# Patient Record
Sex: Female | Born: 1950
Health system: Southern US, Community
[De-identification: ages and names within clinical notes are randomized; demographics above are authoritative.]

## PROBLEM LIST (undated history)

## (undated) DIAGNOSIS — R51 Headache: Secondary | ICD-10-CM

## (undated) DIAGNOSIS — G4730 Sleep apnea, unspecified: Secondary | ICD-10-CM

## (undated) DIAGNOSIS — R569 Unspecified convulsions: Secondary | ICD-10-CM

## (undated) DIAGNOSIS — E7800 Pure hypercholesterolemia, unspecified: Secondary | ICD-10-CM

## (undated) DIAGNOSIS — G2581 Restless legs syndrome: Secondary | ICD-10-CM

## (undated) DIAGNOSIS — G4733 Obstructive sleep apnea (adult) (pediatric): Secondary | ICD-10-CM

## (undated) DIAGNOSIS — I671 Cerebral aneurysm, nonruptured: Secondary | ICD-10-CM

## (undated) DIAGNOSIS — G5602 Carpal tunnel syndrome, left upper limb: Secondary | ICD-10-CM

## (undated) DIAGNOSIS — K591 Functional diarrhea: Secondary | ICD-10-CM

## (undated) DIAGNOSIS — M1990 Unspecified osteoarthritis, unspecified site: Secondary | ICD-10-CM

## (undated) DIAGNOSIS — M25569 Pain in unspecified knee: Secondary | ICD-10-CM

## (undated) DIAGNOSIS — R0600 Dyspnea, unspecified: Secondary | ICD-10-CM

## (undated) DIAGNOSIS — B029 Zoster without complications: Secondary | ICD-10-CM

## (undated) DIAGNOSIS — L719 Rosacea, unspecified: Secondary | ICD-10-CM

## (undated) DIAGNOSIS — I10 Essential (primary) hypertension: Secondary | ICD-10-CM

## (undated) DIAGNOSIS — R55 Syncope and collapse: Secondary | ICD-10-CM

## (undated) DIAGNOSIS — J189 Pneumonia, unspecified organism: Secondary | ICD-10-CM

## (undated) DIAGNOSIS — J45909 Unspecified asthma, uncomplicated: Secondary | ICD-10-CM

## (undated) DIAGNOSIS — K219 Gastro-esophageal reflux disease without esophagitis: Secondary | ICD-10-CM

## (undated) DIAGNOSIS — D649 Anemia, unspecified: Secondary | ICD-10-CM

## (undated) DIAGNOSIS — K589 Irritable bowel syndrome without diarrhea: Secondary | ICD-10-CM

## (undated) DIAGNOSIS — R430 Anosmia: Secondary | ICD-10-CM

## (undated) HISTORY — DX: Zoster without complications: B02.9

## (undated) HISTORY — PX: OTHER SURGICAL HISTORY: SHX169

## (undated) HISTORY — DX: Obstructive sleep apnea (adult) (pediatric): G47.33

## (undated) HISTORY — PX: COLONOSCOPY: SHX174

## (undated) HISTORY — PX: HAMMER TOE SURGERY: SHX385

## (undated) HISTORY — DX: Sleep apnea, unspecified: G47.30

## (undated) HISTORY — PX: WOUND DEBRIDEMENT: SHX247

## (undated) HISTORY — DX: Syncope and collapse: R55

## (undated) HISTORY — DX: Headache: R51

## (undated) HISTORY — DX: Pain in unspecified knee: M25.569

## (undated) HISTORY — PX: ESOPHAGOGASTRODUODENOSCOPY: SHX1529

## (undated) HISTORY — DX: Unspecified asthma, uncomplicated: J45.909

## (undated) HISTORY — DX: Restless legs syndrome: G25.81

## (undated) HISTORY — DX: Pure hypercholesterolemia, unspecified: E78.00

## (undated) HISTORY — PX: BUNIONECTOMY: SHX129

---

## 1981-05-11 HISTORY — PX: ABDOMINAL HYSTERECTOMY: SHX81

## 1984-05-11 DIAGNOSIS — I671 Cerebral aneurysm, nonruptured: Secondary | ICD-10-CM

## 1984-05-11 HISTORY — PX: CEREBRAL ANEURYSM REPAIR: SHX164

## 1984-05-11 HISTORY — DX: Cerebral aneurysm, nonruptured: I67.1

## 1999-12-02 ENCOUNTER — Encounter: Payer: Self-pay | Admitting: Obstetrics and Gynecology

## 1999-12-02 ENCOUNTER — Encounter: Admission: RE | Admit: 1999-12-02 | Discharge: 1999-12-02 | Payer: Self-pay | Admitting: Obstetrics and Gynecology

## 2000-12-02 ENCOUNTER — Encounter: Payer: Self-pay | Admitting: Obstetrics and Gynecology

## 2000-12-02 ENCOUNTER — Encounter: Admission: RE | Admit: 2000-12-02 | Discharge: 2000-12-02 | Payer: Self-pay | Admitting: Obstetrics and Gynecology

## 2002-05-08 ENCOUNTER — Encounter: Payer: Self-pay | Admitting: Neurology

## 2002-05-08 ENCOUNTER — Ambulatory Visit (HOSPITAL_COMMUNITY): Admission: RE | Admit: 2002-05-08 | Discharge: 2002-05-08 | Payer: Self-pay | Admitting: Neurology

## 2002-05-19 ENCOUNTER — Encounter (INDEPENDENT_AMBULATORY_CARE_PROVIDER_SITE_OTHER): Payer: Self-pay | Admitting: Specialist

## 2002-05-19 ENCOUNTER — Ambulatory Visit (HOSPITAL_COMMUNITY): Admission: RE | Admit: 2002-05-19 | Discharge: 2002-05-19 | Payer: Self-pay | Admitting: Gastroenterology

## 2003-02-09 ENCOUNTER — Ambulatory Visit (HOSPITAL_COMMUNITY): Admission: RE | Admit: 2003-02-09 | Discharge: 2003-02-09 | Payer: Self-pay | Admitting: Neurology

## 2003-03-13 ENCOUNTER — Ambulatory Visit (HOSPITAL_COMMUNITY): Admission: RE | Admit: 2003-03-13 | Discharge: 2003-03-14 | Payer: Self-pay | Admitting: Interventional Radiology

## 2005-08-26 ENCOUNTER — Encounter: Payer: Self-pay | Admitting: Interventional Radiology

## 2005-10-22 ENCOUNTER — Inpatient Hospital Stay (HOSPITAL_COMMUNITY): Admission: AD | Admit: 2005-10-22 | Discharge: 2005-10-23 | Payer: Self-pay | Admitting: Interventional Radiology

## 2006-03-22 ENCOUNTER — Ambulatory Visit (HOSPITAL_COMMUNITY): Admission: RE | Admit: 2006-03-22 | Discharge: 2006-03-22 | Payer: Self-pay | Admitting: Interventional Radiology

## 2006-06-07 ENCOUNTER — Emergency Department (HOSPITAL_COMMUNITY): Admission: EM | Admit: 2006-06-07 | Discharge: 2006-06-07 | Payer: Self-pay | Admitting: Emergency Medicine

## 2006-11-09 ENCOUNTER — Ambulatory Visit (HOSPITAL_COMMUNITY): Admission: RE | Admit: 2006-11-09 | Discharge: 2006-11-09 | Payer: Self-pay | Admitting: Interventional Radiology

## 2007-03-31 ENCOUNTER — Ambulatory Visit (HOSPITAL_BASED_OUTPATIENT_CLINIC_OR_DEPARTMENT_OTHER): Admission: RE | Admit: 2007-03-31 | Discharge: 2007-03-31 | Payer: Self-pay | Admitting: Orthopedic Surgery

## 2007-03-31 HISTORY — PX: CARPAL TUNNEL RELEASE: SHX101

## 2007-08-23 ENCOUNTER — Other Ambulatory Visit: Admission: RE | Admit: 2007-08-23 | Discharge: 2007-08-23 | Payer: Self-pay | Admitting: Gastroenterology

## 2007-11-04 ENCOUNTER — Ambulatory Visit (HOSPITAL_COMMUNITY): Admission: RE | Admit: 2007-11-04 | Discharge: 2007-11-04 | Payer: Self-pay | Admitting: Interventional Radiology

## 2008-08-17 ENCOUNTER — Emergency Department (HOSPITAL_COMMUNITY): Admission: EM | Admit: 2008-08-17 | Discharge: 2008-08-17 | Payer: Self-pay | Admitting: Emergency Medicine

## 2008-10-31 ENCOUNTER — Ambulatory Visit (HOSPITAL_COMMUNITY): Admission: RE | Admit: 2008-10-31 | Discharge: 2008-10-31 | Payer: Self-pay | Admitting: Interventional Radiology

## 2008-11-09 ENCOUNTER — Encounter: Payer: Self-pay | Admitting: Interventional Radiology

## 2009-07-31 ENCOUNTER — Encounter: Admission: RE | Admit: 2009-07-31 | Discharge: 2009-07-31 | Payer: Self-pay | Admitting: Neurology

## 2009-11-21 ENCOUNTER — Ambulatory Visit (HOSPITAL_COMMUNITY): Admission: RE | Admit: 2009-11-21 | Discharge: 2009-11-21 | Payer: Self-pay | Admitting: Interventional Radiology

## 2010-07-27 LAB — CREATININE, SERUM
Creatinine, Ser: 0.68 mg/dL (ref 0.4–1.2)
GFR calc Af Amer: >60 mL/min (ref >60)
GFR calc non Af Amer: >60 mL/min (ref >60)

## 2010-07-27 LAB — BUN: BUN: 5 mg/dL — ABNORMAL LOW (ref 6–23)

## 2010-08-18 LAB — CREATININE, SERUM
Creatinine, Ser: 0.8 mg/dL (ref 0.4–1.2)
GFR calc Af Amer: >60 mL/min (ref >60)
GFR calc non Af Amer: >60 mL/min (ref >60)

## 2010-08-18 LAB — BUN: BUN: 8 mg/dL (ref 6–23)

## 2010-09-09 HISTORY — PX: FOOT SURGERY: SHX648

## 2010-09-23 NOTE — Op Note (Signed)
[name], [name]                ACCOUNT NO.:  [id number]   MEDICAL RECORD NO.:  [id number]          PATIENT TYPE:  AMB   LOCATION:  DSC                          FACILITY:  [facility]   PHYSICIAN:  Cindee Salt, M.D.       DATE OF BIRTH:  1950/12/31   DATE OF PROCEDURE:  03/31/2007  DATE OF DISCHARGE:                               OPERATIVE REPORT   PREOPERATIVE DIAGNOSIS:  Carpal tunnel syndrome, right hand.   POSTOPERATIVE DIAGNOSIS:  Carpal tunnel syndrome, right hand.   OPERATION:  Decompression right median nerve.   SURGEON:  Cindee Salt, M.D.   ASSISTANT:  Carolyne Fiscal R.N.   ANESTHESIA:  Forearm based IV regional.   HISTORY:  The patient is a 60 year old female with a history of carpal  tunnel syndrome, EMG nerve conductions positive, which has not responded  to conservative treatment.  She is desirous of release.  She is aware of  risks and complications including infection, recurrence, injury to  arteries, nerves, tendons, incomplete relief of symptoms, dystrophy.  In  the preoperative area, the extremity is marked by both the patient and  surgeon and antibiotic given.   PROCEDURE:  The patient is brought to the operating room, a forearm  based IV regional anesthetic carried out without difficulty.  She was  prepped using DuraPrep, supine position, left arm free.  A longitudinal  incision was made in the palm and carried down through subcutaneous  tissues.  Bleeders were electrocauterized.  The palmar fascia was split.  The superficial palmar arch identified.  The flexor tendon of the ring  and little finger identified. To the ulnar side of the median nerve, the  carpal retinaculum was incised with sharp dissection, right angle and  Sewall retractor placed between skin and forearm fascia.  The fascia was  released for approximately 1.5 cm proximal to the wrist crease under  direct vision.  The canal was explored.  The area of compression of the  nerve was apparent.  No further  lesions identified.  The wound was  irrigated.  The skin closed with interrupted 5-0 Vicryl Rapide sutures.  A sterile compressive dressing and splint to the wrist was applied.  The  patient tolerated the procedure well and was taken to the recovery room  for observation in satisfactory condition.  She is discharged home to  return to the [facility] in one week on Vicodin.           ______________________________  Cindee Salt, M.D.     GK/MEDQ  D:  03/31/2007  T:  03/31/2007  Job:  [id number]

## 2010-09-23 NOTE — Consult Note (Signed)
[name], [name]                ACCOUNT NO.:  [id number]   MEDICAL RECORD NO.:  [id number]          PATIENT TYPE:  OUT   LOCATION:  XRAY                         FACILITY:  [facility]   PHYSICIAN:  Sanjeev K. Deveshwar, M.D.DATE OF BIRTH:  1951-01-20   DATE OF CONSULTATION:  11/09/2008  DATE OF DISCHARGE:  11/09/2008                                 CONSULTATION   CHIEF COMPLAINT:  History of a right internal carotid artery aneurysm  with stents x2.   HISTORY:  This is a very pleasant 60 year old female with a history of  multiple cerebral aneurysms.  She had at least 2 aneurysms clipped in  1986 by Dr. Jeral Fruit.  She later underwent Neuroform stent placement for a  right internal carotid artery aneurysm performed by Dr. Corliss Skains in  November 2004.  In June 2007, she had a second Neuroform stent placed  for the large eccentric symptomatic fusiform aneurysm of the terminus  portion of the right internal carotid artery.   The patient has been followed over the years with repeat cerebral  angiograms.  On October 31, 2008, she had a CT angiogram performed for  further evaluation.  The patient presents with her husband today to  discuss the results of this most recent study.   Past medical history is significant for hypertension, asthma,  hyperlipidemia, and the multiple aneurysms as noted above.   Surgeries include 2 craniotomies for aneurysm clippings in 1986, 2 C-  sections one in 1978 and in 1980.  The patient is status post  hysterectomy.  She is status post surgery of her foot.  She denies any  previous problems with anesthesia.   ALLERGIES:  The patient is allergic to CONTRAST DYE and CODEINE.   SOCIAL HISTORY:  The patient lives in [place] with her husband.  They  have 2 children.  She works as a Runner, broadcasting/film/video.  She quit smoking 30 years  ago.  She does not use alcohol.   FAMILY HISTORY:  The patient's mother is living at age 43, she is  healthy.  Her father's medical history is  unknown.   IMPRESSION AND PLAN:  The patient returns today accompanied by her  husband to discuss the findings of a recent CT angiogram performed on  October 31, 2008, to further evaluate the placement of 2 Neuroform stents  for an unruptured fusiform aneurysm of the right internal carotid  artery.  Dr. Corliss Skains pointed out the aneurysm as well as the location  of the stents.  He felt that the aneurysm was unchanged.  He had hoped  that the aneurysm would have grown smaller following the placement of  the stents; however, there does not appear to be any significant change.  The patient is currently asymptomatic.  She is on aspirin 81 mg daily.  She did not bring a complete list of her medications, so we do not know  all of her medications.  However, we do know that she is on several  medications for her blood pressure.  She reports that her blood  pressures has been under fair control.   Dr. Corliss Skains  recommended a repeat CT angiogram in 1 year.  If the  aneurysm has grown larger or if the patient becomes symptomatic, he will  consider further stenting of this aneurysm.  Otherwise, no specific  treatment is recommended at this time.  All of the patient's and her  husband's questions were answered.  Greater than 10 minutes was spent on  this followup visit.      Delton See, P.A.    ______________________________  Grandville Silos. Corliss Skains, M.D.    DR/MEDQ  D:  11/09/2008  T:  11/10/2008  Job:  [id number]   cc:   Genene Churn. Love, M.D.  Hilda Lias, M.D.  Jethro Bastos, M.D.

## 2010-09-26 NOTE — Op Note (Signed)
   NAME:  [name], [name]                       ACCOUNT NO.:  [id number]   MEDICAL RECORD NO.:  [id number]                   PATIENT TYPE:  AMB   LOCATION:  ENDO                                 FACILITY:  [facility]   PHYSICIAN:  Petra Kuba, M.D.                 DATE OF BIRTH:  [birth year]   DATE OF PROCEDURE:  05/19/2002  DATE OF DISCHARGE:                                 OPERATIVE REPORT   PROCEDURE:  Colonoscopy.   INDICATIONS FOR PROCEDURE:  Guaiac positivity in patient due for colonic  screening.   Consent was signed after risks, benefits, methods, and options were  thoroughly discussed in the office.   MEDICINES USED:  Demerol 50, Versed 7.   DESCRIPTION OF PROCEDURE:  Rectal inspection was pertinent for external  hemorrhoids, small. Digital exam was negative. The pediatric video  adjustable colonoscope was inserted and fairly easily advanced around the  colon to the cecum. This did require some abdominal pressure but no position  changes.  The cecum was identified by the appendiceal orifice and the  ileocecal valve. No obvious abnormality was seen on insertion.  The scope  was slowly withdrawn. The prep was adequate. There was some liquid stool  that required washing and suctioning. On slow withdrawal through the colon,  no abnormalities were seen specifically no diverticula, polyps, masses, AVM,  signs of bleeding, etc. Once back in the rectum, the scope was retroflexed  pertinent for some small internal hemorrhoids. The scope was straightened,  air was suctioned, scope removed. The patient tolerated the procedure well.  There was no obvious or immediate complications.   ENDOSCOPIC DIAGNOSIS:  1. Internal and external hemorrhoids.  2. Otherwise within normal limits to the cecum.    PLAN:  Repeat colon screening in five years. Continuous workup with an EGD,  please see that dictation for other recommendations and plans.     Petra Kuba, M.D.    MEM/MEDQ  D:  05/19/2002  T:  05/19/2002  Job:  [id number]   cc:   S. Kyra Manges, M.D.  [phone] [address]  [place]  [place] [address]  Fax: [phone]   Jethro Bastos, M.D.  [address]  [place]  [place] [address]  Fax: [phone]

## 2010-09-26 NOTE — Consult Note (Signed)
[name], [name]                ACCOUNT NO.:  [id number]   MEDICAL RECORD NO.:  [id number]            PATIENT TYPE:   LOCATION:                                 FACILITY:   PHYSICIAN:  Sanjeev K. Deveshwar, M.D.DATE OF BIRTH:  01-Aug-1950   DATE OF CONSULTATION:  11/12/2005  DATE OF DISCHARGE:                                   CONSULTATION   DATE OF CONSULT:  11/12/2005.   BRIEF HISTORY:  This is a very pleasant 60 year old female with a history of  multiple aneurysms.  She underwent clipping of 2 separate aneurysms on two  separate occasions in 1986 performed by Dr. Jeral Fruit.  She had a neuroform  stent placed in the right internal carotid artery for an aneurysm in  November of 2004 by Dr. Corliss Skains.  Recently on 10/22/2005 the patient had  an additional stent placed in this same area after the angiogram revealed  persistence of the aneurysm.  The patient tolerated this well; and she was  discharged the following day on 10/23/2005.  She now returns, accompanied by  her husband, to be seen in followup.   PAST MEDICAL HISTORY:  1.  Significant for hypertension.  2.  Asthma.  3.  Hyperlipidemia.  4.  Multiple aneurysms as noted above.   SURGERIES INCLUDE:  1.  Two craniotomies for aneurysm clippings in 1986.  2.  Two C-sections one in 1978 and one in 1980.  3.  She is status post hysterectomy.  4.  She is status post foot surgery.   ALLERGIES:  The patient is allergic to CONTRAST DYE and CODEINE.   SOCIAL HISTORY:  The patient lives in [place].  She has two children.  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  She does not use  alcohol.   FAMILY HISTORY:  The patient's mother is living at age 45.  She is healthy.  Her father's medical history is unknown.   IMPRESSION/PLAN:  The patient returns today approximately 2 weeks after a  second stent was placed in her right internal carotid artery aneurysm.  She  has been asymptomatic since the procedure.  She  completed a 2-week course of  Plavix and now remains on aspirin 81 mg daily along with her other  medications.  She has resumed driving without problems.  Her right femoral  groin wound has healed well.   The patient did have some hypokalemia and anemia while in the hospital; she  was to followup with her primary care physician to have these rechecked;  however, she has not yet made that appointment.   Overall, the patient is doing well.  Dr. Corliss Skains recommended a repeat  angiogram in approximately 3-4 months.  As noted she is allergic to CONTRAST  DYE and she has been given a prescription for a 13-hour prednisone protocol  for the upcoming angiogram in 3-4 months.   Greater than 15 minutes was spent on this consult.      Delton See, P.A.    ______________________________  Grandville Silos. Corliss Skains, M.D.    DR/MEDQ  D:  11/12/2005  T:  11/12/2005  Job:  [id number]   cc:   Jethro Bastos, M.D.  Fax: [phone]   Hilda Lias, M.D.  Fax: [phone]   Genene Churn. Love, M.D.  Fax: [phone]

## 2010-09-26 NOTE — Discharge Summary (Signed)
NAMELYNSI, DOONER                ACCOUNT NO.:  [id number]   MEDICAL RECORD NO.:  [id number]          PATIENT TYPE:  OIB   LOCATION:  3110                         FACILITY:  [facility]   PHYSICIAN:  Sanjeev K. Deveshwar, M.D.DATE OF BIRTH:  11/07/1950   DATE OF ADMISSION:  10/22/2005  DATE OF DISCHARGE:  10/23/2005                                 DISCHARGE SUMMARY   CHIEF COMPLAINT:  History of aneurysm.   HISTORY OF PRESENT ILLNESS:  This is a very pleasant 60 year old female with  a long history of cerebral aneurysms.  In 1986, she underwent two separate  craniotomies and clipping of 2 aneurysms.  The initial one was in May 1986,  at which time she had a right trifurcation and right posterior communicating  artery aneurysm clipped.  Later that same year, she underwent a repeat  craniotomy and clipping of a small left internal carotid artery aneurysm in  November 2004.  The patient had a Neuroform stent placed in a large wide  neck right internal carotid artery aneurysm by Dr. Corliss Skains.  She was  readmitted to [facility] on October 22, 2005, to further address this  aneurysm.   PAST MEDICAL HISTORY:  1.  Significant for the above noted aneurysms and treatments.  2.  She also has a history of hypertension.  3.  A history of asthma.  4.  Hyperlipidemia.  5.  She had an endoscopy and colonoscopy in 2003, that showed some ulcers      according to the patient, however, these were healing and she has not      had any problem since.   SURGICAL HISTORY:  1.  Significant for the 2 craniotomies with aneurysm clippings in 1986.  2.  She has had 2 C-sections.  3.  She is status post hysterectomy.  4.  She is status post bunionectomy and hammer toe repair.   ALLERGIES:  1.  CONTRAST DYE.  2.  CODEINE.   She denies any problems with anesthesia.   MEDICATIONS AT TIME OF ADMISSION:  1.  Albuterol p.r.n.  2.  Topamax 25 mg 3 times daily.  3.  Potassium chloride 20 mEq b.i.d.  4.   Advair inhaler 250/50, two times daily.  5.  Fluticasone nasal spray twice daily.  6.  Calcium with vitamin D twice daily.  7.  Lipitor 40 mg daily.  8.  Singulair 10 mg daily.  9.  Plavix 75 mg daily.  10. L-lysine 50 mg daily.  11. Phenobarbital 64 mg, 2 tablets at bedtime.  12. Celebrex 200 mg daily.  13. Benazepril 10 mg daily.  14. Nexium 40 mg daily.  15. Zyrtec 10 mg daily.  16. Triamterene hydrochlorothiazide 75/50, one daily.  17. Centrum Silver daily.  18. Estradiol 0.5 mg, 1 in the morning, 2 in the evening.  19. Effexor XR 75 mg b.i.d.  20. Tramadol/hydrochloride 50 mg t.i.d. p.r.n.  21. Gabapentin 300 mg t.i.d.  22. She was recently started on aspirin and Plavix prior to this admission.   She also received a 13-hour prednisone protocol due to contrast allergy.  She received Benadryl, Ancef, nimodipine, aspirin, and Plavix prior to the  angiogram.   SOCIAL HISTORY:  The patient lives in [place], [place].  She has 2  children.  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  She does  not use alcohol.  She is married.   FAMILY HISTORY:  Her mother is still living at age 41.  She apparently is  healthy.  Her father's history is unknown.   HOSPITAL COURSE:  As noted, this patient was admitted to the [facility] on October 22, 2005, to undergo further intervention on a previously  stented right internal carotid artery aneurysm.  An angiogram was performed  on the day of admission.  Dr. Corliss Skains felt that the aneurysm had not  completely sealed and a second stent was placed inside the previously placed  stent in order to divert more blood flow away from the aneurysm.  The  patient tolerated the procedure well.  She was admitted to the neuro  intensive care unit, where she remained on IV heparin and ReoPro .  A saline lock was placed shortly after the intervention.  While the patient  was on heparin and ReoPro, she did develop a small hematoma  in her right  forearm.  This was carefully monitored throughout her hospital stay.  The day following the procedure, her labs revealed a low potassium of 2.8,  sodium was 129.  Her white blood cell count was mildly elevated at 12.6.  Her hemoglobin was 10.6.  The potassium was supplemented and her diuretics  were held.  The right femoral groin sheath was removed on October 23, 2005.  She is to  remain on bedrest for 6 hours following the removal of the sheath, after  which she will be ambulated.  If she is feeling up to it and she remains  medically stable, the plan will be to discharge the patient later this  evening.   LABORATORY DATA:  On admission hemoglobin had been 14.9, hematocrit 42.4,  WBCs 8.5 thousand, platelets 342,000.  On admission her chemistry profile  had shown a BUN of 11, creatinine was 0.8, potassium was 3.8, sodium was  132.  Please see the results of other labs as listed above.  A chest x-ray  shows no active cardiopulmonary disease.  An EKG showed normal sinus rhythm,  rate 72 beats per minute, and was interpreted as a normal EKG.   The patient was instructed to remain on the same medications she was taking  prior to admission.  She would take Plavix for 2 weeks following the  procedure.  She would remain on aspirin 81 mg daily indefinitely.   She was told to stay on a low fat, low cholesterol diet.   She was not to drive or do anything strenuous for at least 2 weeks.   She was given instructions regarding her wound care.   1.  A followup angiogram was recommended in 3 months.  2.  The patient was told to follow up with Dr. Dorothe Pea in 1-2 weeks to have      a repeat CBC and basic metabolic blood test.  3.  She was to follow up with Dr. Corliss Skains, Thursday, July 5 at 3 p.m.   PROBLEM LIST AT THE TIME OF DISCHARGE:  1.  Placement of a second stent in a right internal carotid artery that was      previously stented in November 2004. 2.  History of  two separate  craniotomies for clipping of two separate      aneurysms in 1986.  3.  History of hypertension.  4.  History of asthma.  5.  History of hyperlipidemia.  6.  Status post multiple surgeries.  7.  Chronic headaches, possibly migraines.  8.  Question of depression.  9.  Contrast allergy.  10. Codeine allergy.  11. Hypokalemia.  12. Mild anemia.  13. Mildly elevated white blood cell count.  14. Hyponatremia.   As noted, the plan is to discharge the patient this evening in stable and  improved condition.      Delton See, P.A.    ______________________________  Grandville Silos. Corliss Skains, M.D.    DR/MEDQ  D:  10/23/2005  T:  10/23/2005  Job:  [id number]   cc:   Jethro Bastos, M.D.  Fax: [phone]   Hilda Lias, M.D.  Fax: [phone]   Genene Churn. Love, M.D.  Fax: [phone]

## 2010-09-26 NOTE — Op Note (Signed)
NAME:  BEN, SANZ                       ACCOUNT NO.:  [id number]   MEDICAL RECORD NO.:  [id number]                   PATIENT TYPE:  AMB   LOCATION:  ENDO                                 FACILITY:  [facility]   PHYSICIAN:  Petra Kuba, M.D.                 DATE OF BIRTH:  [birth year]   DATE OF PROCEDURE:  05/19/2002  DATE OF DISCHARGE:                                 OPERATIVE REPORT   PROCEDURE:  Esophagogastroduodenoscopy with biopsy.   INDICATIONS FOR PROCEDURE:  Upper tract symptoms, guaiac positivity,  nondiagnostic colonoscopy.   Consent was signed prior to any premeds given after the risks, benefits,  methods, and options were thoroughly discussed in the office.   ADDITIONAL MEDICINES FOR THIS PROCEDURE:  2 of Versed only.   DESCRIPTION OF PROCEDURE:  The video endoscope was inserted by direct  vision. The proximal mid esophagus was normal. In the distal esophagus was a  small hiatal hernia without any obvious signs of Barrett's. The scope passed  into the stomach and advanced through the antrum pertinent for some antritis  and possibly some scarring from previous ulcer disease. The scope passed  through a normal pylorus into a normal duodenal bulb and around the C loop  to a normal second portion of the duodenum. Possibly the scope was advanced  in the third part of the duodenum. The scope was slowly withdrawn back to  the bulb. No additional findings were seen in the duodenum. Once back in the  stomach, the scope was retroflexed. High in the cardia the hiatal hernia was  confirmed. The fundus, angularis, lesser and greater curve were all normal.  The scope was straightened and straight visualization of the stomach was  normal except for the antritis mentioned above. We went ahead and took two  biopsies of the antrum and two of the proximal stomach to rule out  Helicobacter. The scope was then slowly withdrawn. Again a good look at the  esophagus confirmed the above  findings although there were no obvious signs  of Barrett's. We went ahead and took a few distal esophageal biopsies just  to be sure and put them in a separate container. The scope was inserted into  the stomach, air was suctioned, scope removed. The patient tolerated the  procedure well. There was no obvious or immediate complication.   ENDOSCOPIC DIAGNOSIS:  1. Small hiatal hernia doubt Barrett's status post distal esophageal     biopsies.  2. Mild antritis with questionable scarring from previous ulcer disease     status post biopsy of the antrum as well as the proximal stomach.  3. Otherwise normal EGD.    PLAN:  Await pathology, continue pump inhibitors. Follow-up p.r.n. or in six  weeks to recheck guaiacs, probably CBC symptoms and decide any other workup  and plans. Possibly a small bowel follow through.  Petra Kuba, M.D.    MEM/MEDQ  D:  05/19/2002  T:  05/19/2002  Job:  [id number]   cc:   Jethro Bastos, M.D.  [address]  [place]  [place] [address]  Fax: [phone]   S. Kyra Manges, M.D.  [phone] [address]  [place]  [place] [address]  Fax: [phone]

## 2010-09-26 NOTE — H&P (Signed)
[name], [name]                ACCOUNT NO.:  [id number]   MEDICAL RECORD NO.:  [id number]          PATIENT TYPE:  AMB   LOCATION:  SDS                          FACILITY:  [facility]   PHYSICIAN:  Sanjeev K. Deveshwar, M.D.DATE OF BIRTH:  [birth year]   DATE OF ADMISSION:  10/22/2005  DATE OF DISCHARGE:                                HISTORY & PHYSICAL   CHIEF COMPLAINT:  History of aneurysms.   HISTORY OF PRESENT ILLNESS:  This is a very pleasant 60 year old female with  a long  history of cerebral aneurysms.  In 1986, she underwent two separate  craniotomies and clipping of aneurysms.  The initial one was in May of 1986  at which time she had a right trifurcation and right posterior communicating  artery aneurysm clipped.  Later that year, she had a repeat craniotomy and  clipping of a small left internal carotid artery aneurysm.  In November of  2004, the patient had a Neuroform stent placed in a large wide necked right  internal carotid artery aneurysm by Dr. Corliss Skains.  She now returns for a  follow-up cerebral angiogram, possible further intervention, if felt to be  indicated and safe.   ALLERGIES:  She is allergic to CONTRAST DYE and CODEINE.  She denies any  previous problems with aneurysm.   CURRENT MEDICATIONS:  1.  Albuterol p.r.n.  2.  Topamax 25 mg three tablets daily.  3.  Potassium chloride 20 mEq b.i.d.  4.  Advair inhaler 250/50 two times daily.  5.  Fluticasone nasal spray twice daily.  6.  Calcium with vitamin D one twice daily.  7.  Lipitor 40 mg daily.  8.  Singulair 10 mg daily.  9.  Plavix 75 mg daily.  10. L-Lysine 50 mg daily.  11. Phenobarbital 64 mg two tablets at bedtime.  12. Celebrex 200 mg daily.  13. Benazepril 10 mg daily.  14. Nexium 40 mg daily.  15. Zyrtec 10 mg daily.  16. Triamterene/hydrochlorothiazide 75/50 daily.  17. Centrum Silver daily.  18. Estradiol 0.5 mg one in the morning and two in the evenings.  19. Effexor XR 75 mg b.i.d.  20. Tramadol hydrochloride 50 mg t.i.d. p.r.n.  21. Gabapentin 300 mg t.i.d.  22. She was recently started on aspirin and Plavix prior to her angiogram      today.  She also received a 13-hour prednisone protocol due to her      contrast allergy.  She received Benadryl this morning along with Ancef,      nimodipine, aspirin and Plavix.  She received Plavix 150 mg.   PAST MEDICAL HISTORY:  1.  Significant for the above noted aneurysms and treatments.  2.  She has a history of hypertension.  3.  History of asthma.  4.  Hyperlipidemia.  5.  She had an endoscopy and colonoscopy in 2003 that showed some ulcers      which apparently were healing.  She has not had any significant problem      with ulcers since that time.   PAST SURGICAL HISTORY:  1.  Two craniotomies with  aneurysm clippings in 1986.  2.  She has had two C. sections one in 1978 and one in 1980.  3.  She is status post hysterectomy.  4.  Status post bunionectomy and hammertoe repair.   REVIEW OF SYSTEMS:  Completely negative except for the following.  She has  been having headaches but this is normal for her.  She has a long history of  headaches, possibly migraines.  She has frequent diarrhea.  She has  osteoarthritis for her hands.  She reports bruising easily.  The remainder  of the review of systems is negative.   SOCIAL HISTORY:  [place]  She has  two children.  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  She  does not use alcohol.   FAMILY HISTORY:  Her mother is still living at age 86.  She apparently is  healthy.  Her father's history is unknown.   LABORATORY DATA:  INR 0.9, PTT 27.  CBC reveals hemoglobin 14.9, hematocrit  42.4, wbc 8.5000, platelets 342,000. BUN 11, creatinine 0.8, potassium 3.8,  sodium 132, glucose 88.   PHYSICAL EXAMINATION:  GENERAL APPEARANCE:  A pleasant 60 year old white  female in no acute distress.  VITAL SIGNS:  Blood pressure  160/92, pulse 113, respirations 16, temperature  97.3, oxygen saturation 99% on room air.  HEENT:  Unremarkable.  NECK:  No bruits, no jugular venous distension.  LUNGS:  Clear.  CARDIOVASCULAR:  Regular rate and rhythm without murmurs.  ABDOMEN:  Soft and nontender.  EXTREMITIES:  Pulses intact without edema.  NEUROLOGIC:  Mental status:  The patient is alert and oriented and follows  commands.  Cranial nerves II-XII grossly intact.  Sensation is intact to  light touch.  Motor strength is 5/5.  Cerebellar testing is intact.  Her  airway is rated at a 3.  ASA scale is rated at a 2.   IMPRESSION:  1.  History of multiple aneurysms as documented above with two previous      clippings and one stenting procedure performed.  2.  History of hypertension.  3.  History of asthma.  4.  History of hyperlipidemia.  5.  History of multiple surgeries as noted.  6.  Chronic headaches, question of migraines.  7.  Question depression.  8.  Contrast dye allergy.  9.  Codeine allergy.   PLAN:  As noted, the patient will undergo cerebral angiogram today with  possible further intervention if felt to be safe and indicated.      Delton See, P.A.    ______________________________  Grandville Silos. Corliss Skains, M.D.    DR/MEDQ  D:  10/22/2005  T:  10/22/2005  Job:  [id number]   cc:   Jethro Bastos, M.D.  Fax: [phone]   Hilda Lias, M.D.  Fax: [phone]   Genene Churn. Love, M.D.  Fax: [phone]

## 2011-02-05 LAB — CREATININE, SERUM: GFR calc non Af Amer: >60

## 2011-02-17 LAB — BASIC METABOLIC PANEL
BUN: 10
Chloride: 100
Potassium: 4.5
Sodium: 141

## 2011-02-17 LAB — POCT HEMOGLOBIN-HEMACUE
Hemoglobin: 13
Operator id: 208731

## 2011-05-29 ENCOUNTER — Other Ambulatory Visit: Payer: Self-pay | Admitting: Orthopedic Surgery

## 2011-06-12 ENCOUNTER — Encounter (HOSPITAL_BASED_OUTPATIENT_CLINIC_OR_DEPARTMENT_OTHER): Payer: Self-pay | Admitting: *Deleted

## 2011-06-12 DIAGNOSIS — G5602 Carpal tunnel syndrome, left upper limb: Secondary | ICD-10-CM

## 2011-06-12 HISTORY — DX: Carpal tunnel syndrome, left upper limb: G56.02

## 2011-06-12 NOTE — Pre-Procedure Instructions (Signed)
To come for BMET and EKG 

## 2011-06-12 NOTE — Pre-Procedure Instructions (Signed)
PCP - Dr. Guadalupe Dawn, [place]   [phone]

## 2011-06-16 ENCOUNTER — Encounter (HOSPITAL_BASED_OUTPATIENT_CLINIC_OR_DEPARTMENT_OTHER)
Admission: RE | Admit: 2011-06-16 | Discharge: 2011-06-16 | Disposition: A | Discharge status: Home / Self Care | Payer: BC Managed Care – PPO | Source: Ambulatory Visit | Attending: Orthopedic Surgery | Admitting: Orthopedic Surgery

## 2011-06-17 ENCOUNTER — Encounter (HOSPITAL_BASED_OUTPATIENT_CLINIC_OR_DEPARTMENT_OTHER)
Admission: RE | Admit: 2011-06-17 | Discharge: 2011-06-17 | Disposition: A | Discharge status: Home / Self Care | Payer: BC Managed Care – PPO | Source: Ambulatory Visit | Attending: Orthopedic Surgery | Admitting: Orthopedic Surgery

## 2011-06-17 ENCOUNTER — Other Ambulatory Visit: Payer: Self-pay

## 2011-06-17 LAB — BASIC METABOLIC PANEL
BUN: 12 mg/dL (ref 6–23)
Chloride: 90 mEq/L — ABNORMAL LOW (ref 96–112)
GFR calc Af Amer: >90 mL/min (ref >90)
GFR calc non Af Amer: >90 mL/min (ref >90)
Potassium: 3.4 mEq/L — ABNORMAL LOW (ref 3.5–5.1)
Sodium: 131 mEq/L — ABNORMAL LOW (ref 135–145)

## 2011-06-18 ENCOUNTER — Encounter (HOSPITAL_BASED_OUTPATIENT_CLINIC_OR_DEPARTMENT_OTHER): Payer: Self-pay | Admitting: *Deleted

## 2011-06-19 ENCOUNTER — Encounter (HOSPITAL_BASED_OUTPATIENT_CLINIC_OR_DEPARTMENT_OTHER): Payer: Self-pay | Admitting: Orthopedic Surgery

## 2011-06-19 ENCOUNTER — Encounter (HOSPITAL_BASED_OUTPATIENT_CLINIC_OR_DEPARTMENT_OTHER): Payer: Self-pay | Admitting: Anesthesiology

## 2011-06-19 ENCOUNTER — Encounter (HOSPITAL_BASED_OUTPATIENT_CLINIC_OR_DEPARTMENT_OTHER)
Admission: RE | Disposition: A | Discharge status: Home / Self Care | Payer: Self-pay | Source: Ambulatory Visit | Attending: Orthopedic Surgery

## 2011-06-19 ENCOUNTER — Ambulatory Visit (HOSPITAL_BASED_OUTPATIENT_CLINIC_OR_DEPARTMENT_OTHER): Payer: BC Managed Care – PPO | Admitting: Anesthesiology

## 2011-06-19 ENCOUNTER — Ambulatory Visit (HOSPITAL_BASED_OUTPATIENT_CLINIC_OR_DEPARTMENT_OTHER)
Admission: RE | Admit: 2011-06-19 | Discharge: 2011-06-19 | Disposition: A | Discharge status: Home / Self Care | Payer: BC Managed Care – PPO | Source: Ambulatory Visit | Attending: Orthopedic Surgery | Admitting: Orthopedic Surgery

## 2011-06-19 DIAGNOSIS — K219 Gastro-esophageal reflux disease without esophagitis: Secondary | ICD-10-CM | POA: Insufficient documentation

## 2011-06-19 DIAGNOSIS — G5600 Carpal tunnel syndrome, unspecified upper limb: Secondary | ICD-10-CM | POA: Insufficient documentation

## 2011-06-19 DIAGNOSIS — Z01818 Encounter for other preprocedural examination: Secondary | ICD-10-CM | POA: Insufficient documentation

## 2011-06-19 DIAGNOSIS — Z01812 Encounter for preprocedural laboratory examination: Secondary | ICD-10-CM | POA: Insufficient documentation

## 2011-06-19 DIAGNOSIS — R569 Unspecified convulsions: Secondary | ICD-10-CM | POA: Insufficient documentation

## 2011-06-19 DIAGNOSIS — I10 Essential (primary) hypertension: Secondary | ICD-10-CM | POA: Insufficient documentation

## 2011-06-19 DIAGNOSIS — J45909 Unspecified asthma, uncomplicated: Secondary | ICD-10-CM | POA: Insufficient documentation

## 2011-06-19 HISTORY — DX: Carpal tunnel syndrome, left upper limb: G56.02

## 2011-06-19 HISTORY — DX: Gastro-esophageal reflux disease without esophagitis: K21.9

## 2011-06-19 HISTORY — DX: Anosmia: R43.0

## 2011-06-19 HISTORY — DX: Unspecified osteoarthritis, unspecified site: M19.90

## 2011-06-19 HISTORY — PX: CARPAL TUNNEL RELEASE: SHX101

## 2011-06-19 HISTORY — DX: Restless legs syndrome: G25.81

## 2011-06-19 HISTORY — DX: Cerebral aneurysm, nonruptured: I67.1

## 2011-06-19 HISTORY — DX: Essential (primary) hypertension: I10

## 2011-06-19 HISTORY — DX: Unspecified convulsions: R56.9

## 2011-06-19 SURGERY — CARPAL TUNNEL RELEASE
Anesthesia: Monitor Anesthesia Care | Laterality: Left

## 2011-06-19 MED ORDER — FENTANYL CITRATE 0.05 MG/ML IJ SOLN: 25.0000 ug | INTRAMUSCULAR | Status: DC | PRN

## 2011-06-19 MED ORDER — LIDOCAINE HCL (PF) 0.5 % IJ SOLN
INTRAMUSCULAR | Status: DC | PRN
  Administered 2011-06-19: 35 mL via INTRATHECAL

## 2011-06-19 MED ORDER — CEFAZOLIN SODIUM 1-5 GM-% IV SOLN: 1.0000 g | INTRAVENOUS | Status: DC

## 2011-06-19 MED ORDER — LIDOCAINE HCL (CARDIAC) 20 MG/ML IV SOLN
INTRAVENOUS | Status: DC | PRN
  Administered 2011-06-19: 50 mg via INTRAVENOUS

## 2011-06-19 MED ORDER — MIDAZOLAM HCL 5 MG/5ML IJ SOLN
INTRAMUSCULAR | Status: DC | PRN
  Administered 2011-06-19: 2 mg via INTRAVENOUS

## 2011-06-19 MED ORDER — CHLORHEXIDINE GLUCONATE 4 % EX LIQD: 60.0000 mL | Freq: Once | CUTANEOUS | Status: DC

## 2011-06-19 MED ORDER — CEFAZOLIN SODIUM-DEXTROSE 2-3 GM-% IV SOLR
2.0000 g | INTRAVENOUS | Status: AC
  Administered 2011-06-19: 2 g via INTRAVENOUS

## 2011-06-19 MED ORDER — BUPIVACAINE HCL (PF) 0.25 % IJ SOLN
INTRAMUSCULAR | Status: DC | PRN
  Administered 2011-06-19: 6.5 mL

## 2011-06-19 MED ORDER — METOCLOPRAMIDE HCL 5 MG/ML IJ SOLN: 10.0000 mg | Freq: Once | INTRAMUSCULAR | Status: DC | PRN

## 2011-06-19 MED ORDER — PENTAZOCINE-NALOXONE 50-0.5 MG PO TABS: 1.0000 | ORAL_TABLET | ORAL | Status: AC | PRN

## 2011-06-19 MED ORDER — PROPOFOL 10 MG/ML IV EMUL
INTRAVENOUS | Status: DC | PRN
  Administered 2011-06-19: 50 ug/kg/min via INTRAVENOUS

## 2011-06-19 MED ORDER — LACTATED RINGERS IV SOLN
INTRAVENOUS | Status: DC
  Administered 2011-06-19: 13:00:00 via INTRAVENOUS

## 2011-06-19 MED ORDER — FENTANYL CITRATE 0.05 MG/ML IJ SOLN
INTRAMUSCULAR | Status: DC | PRN
  Administered 2011-06-19: 100 ug via INTRAVENOUS

## 2011-06-19 MED ORDER — ONDANSETRON HCL 4 MG/2ML IJ SOLN
INTRAMUSCULAR | Status: DC | PRN
  Administered 2011-06-19: 4 mg via INTRAVENOUS

## 2011-06-19 MED ORDER — KETOROLAC TROMETHAMINE 30 MG/ML IJ SOLN
INTRAMUSCULAR | Status: DC | PRN
  Administered 2011-06-19: 30 mg via INTRAVENOUS

## 2011-06-19 MED ORDER — MORPHINE SULFATE 4 MG/ML IJ SOLN: 0.0500 mg/kg | INTRAMUSCULAR | Status: DC | PRN

## 2011-06-19 SURGICAL SUPPLY — 35 items
BANDAGE GAUZE ELAST BULKY 4 IN (GAUZE/BANDAGES/DRESSINGS) ×2 IMPLANT
BLADE SURG 15 STRL LF DISP TIS (BLADE) ×1 IMPLANT
BLADE SURG 15 STRL SS (BLADE) ×2
BNDG CMPR 9X4 STRL LF SNTH (GAUZE/BANDAGES/DRESSINGS)
BNDG COHESIVE 3X5 TAN STRL LF (GAUZE/BANDAGES/DRESSINGS) ×2 IMPLANT
BNDG ESMARK 4X9 LF (GAUZE/BANDAGES/DRESSINGS) IMPLANT
CHLORAPREP W/TINT 26ML (MISCELLANEOUS) ×2 IMPLANT
CLOTH BEACON ORANGE TIMEOUT ST (SAFETY) ×2 IMPLANT
CORDS BIPOLAR (ELECTRODE) ×2 IMPLANT
COVER MAYO STAND STRL (DRAPES) ×2 IMPLANT
COVER TABLE BACK 60X90 (DRAPES) ×2 IMPLANT
CUFF TOURNIQUET SINGLE 18IN (TOURNIQUET CUFF) ×2 IMPLANT
DRAPE EXTREMITY T 121X128X90 (DRAPE) ×2 IMPLANT
DRAPE SURG 17X23 STRL (DRAPES) ×2 IMPLANT
DRSG KUZMA FLUFF (GAUZE/BANDAGES/DRESSINGS) ×2 IMPLANT
GAUZE XEROFORM 1X8 LF (GAUZE/BANDAGES/DRESSINGS) ×2 IMPLANT
GLOVE BIO SURGEON STRL SZ 6.5 (GLOVE) ×2 IMPLANT
GLOVE SURG ORTHO 8.0 STRL STRW (GLOVE) ×2 IMPLANT
GOWN BRE IMP PREV XXLGXLNG (GOWN DISPOSABLE) ×2 IMPLANT
GOWN PREVENTION PLUS XLARGE (GOWN DISPOSABLE) ×2 IMPLANT
NEEDLE 27GAX1X1/2 (NEEDLE) IMPLANT
NS IRRIG 1000ML POUR BTL (IV SOLUTION) ×2 IMPLANT
PACK BASIN DAY SURGERY FS (CUSTOM PROCEDURE TRAY) ×2 IMPLANT
PAD CAST 3X4 CTTN HI CHSV (CAST SUPPLIES) ×1 IMPLANT
PADDING CAST ABS 4INX4YD NS (CAST SUPPLIES) ×1
PADDING CAST ABS COTTON 4X4 ST (CAST SUPPLIES) ×1 IMPLANT
PADDING CAST COTTON 3X4 STRL (CAST SUPPLIES) ×2
SPONGE GAUZE 4X4 12PLY (GAUZE/BANDAGES/DRESSINGS) ×2 IMPLANT
STOCKINETTE 4X48 STRL (DRAPES) ×2 IMPLANT
SUT VICRYL 4-0 PS2 18IN ABS (SUTURE) IMPLANT
SUT VICRYL RAPIDE 4/0 PS 2 (SUTURE) ×2 IMPLANT
SYR BULB 3OZ (MISCELLANEOUS) ×2 IMPLANT
SYR CONTROL 10ML LL (SYRINGE) IMPLANT
TOWEL OR 17X24 6PK STRL BLUE (TOWEL DISPOSABLE) ×2 IMPLANT
UNDERPAD 30X30 INCONTINENT (UNDERPADS AND DIAPERS) ×2 IMPLANT

## 2011-06-19 NOTE — Brief Op Note (Signed)
06/19/2011  2:01 PM  PATIENT:  Denver Faster  61 y.o. female  PRE-OPERATIVE DIAGNOSIS:  Carpal Tunnel Syndrome left  POST-OPERATIVE DIAGNOSIS:  Carpal Tunnel Syndrome left  PROCEDURE:  Procedure(s): CARPAL TUNNEL RELEASE  SURGEON:  Surgeon(s): Nicki Reaper, MD  PHYSICIAN ASSISTANT:   ASSISTANTS: none   ANESTHESIA:   local and regional  EBL:  Total I/O In: 400 [I.V.:400] Out: -   BLOOD ADMINISTERED:none  DRAINS: none   LOCAL MEDICATIONS USED:  MARCAINE 6.5CC  SPECIMEN:  No Specimen  DISPOSITION OF SPECIMEN:  N/A  COUNTS:  YES  TOURNIQUET:   Total Tourniquet Time Documented: Forearm (Left) - 20 minutes  DICTATION: .Other Dictation: Dictation Number [id number]  PLAN OF CARE: Discharge to home after PACU  PATIENT DISPOSITION:  PACU - hemodynamically stable.

## 2011-06-19 NOTE — Anesthesia Preprocedure Evaluation (Signed)
Anesthesia Evaluation  Patient identified by MRN, date of birth, ID band Patient awake    Reviewed: Allergy & Precautions, H&P , NPO status , Patient's Chart, lab work & pertinent test results, reviewed documented beta blocker date and time   Airway Mallampati: II TM Distance: >3 FB Neck ROM: full    Dental   Pulmonary asthma ,          Cardiovascular hypertension, On Medications     Neuro/Psych Seizures -, Well Controlled,  Negative Psych ROS   GI/Hepatic Neg liver ROS, GERD-  Medicated and Controlled,  Endo/Other  Morbid obesity  Renal/GU negative Renal ROS  Genitourinary negative   Musculoskeletal   Abdominal   Peds  Hematology negative hematology ROS (+)   Anesthesia Other Findings See surgeon's H&P   Reproductive/Obstetrics negative OB ROS                           Anesthesia Physical Anesthesia Plan  ASA: III  Anesthesia Plan: MAC and Bier Block   Post-op Pain Management:    Induction: Intravenous  Airway Management Planned: Simple Face Mask  Additional Equipment:   Intra-op Plan:   Post-operative Plan:   Informed Consent: I have reviewed the patients History and Physical, chart, labs and discussed the procedure including the risks, benefits and alternatives for the proposed anesthesia with the patient or authorized representative who has indicated his/her understanding and acceptance.     Plan Discussed with: CRNA and Surgeon  Anesthesia Plan Comments:         Anesthesia Quick Evaluation

## 2011-06-19 NOTE — Transfer of Care (Signed)
Immediate Anesthesia Transfer of Care Note  Patient: [name]  Procedure(s) Performed:  CARPAL TUNNEL RELEASE  Patient Location: PACU  Anesthesia Type: MAC and Regional  Level of Consciousness: awake, alert  and oriented  Airway & Oxygen Therapy: Patient Spontanous Breathing  Post-op Assessment: Report given to PACU RN and Post -op Vital signs reviewed and stable  Post vital signs: Reviewed and stable  Complications: No apparent anesthesia complications

## 2011-06-19 NOTE — Anesthesia Postprocedure Evaluation (Signed)
Anesthesia Post Note  Patient: [name]  Procedure(s) Performed:  CARPAL TUNNEL RELEASE  Anesthesia type: MAC  Patient location: PACU  Post pain: Pain level controlled  Post assessment: Patient's Cardiovascular Status Stable  Last Vitals:  Filed Vitals:   06/19/11 1430  BP: 135/60  Pulse:   Temp:   Resp:     Post vital signs: Reviewed and stable  Level of consciousness: alert  Complications: No apparent anesthesia complications

## 2011-06-19 NOTE — Op Note (Signed)
Dictated number: [id number]

## 2011-06-19 NOTE — H&P (Signed)
[name] returns to the office today, she has not been seen in five years. She comes in complaining of her left hand with pain, numbness and tingling, decreased grip and strength. This awakens her 7 out of 7 nights. She has no new history of injury. She states they are very similar to the right side which we released in 2008 which is doing well.  She states driving causes increased symptoms.  She complains of constant, moderate, dull aching type pain with a feeling of numbness and weakness all fingers except her little. She states it is getting worse.  She has been taking ibuprofen 1800 mg. a day, has no history of diabetes, thyroid problems, arthritis or gout.  No history of injury to her neck.   ALLERGIES:    Codeine and Compazine.  MEDICATIONS:    Nexium, triamterene/HCTZ, generic allergy tablet, Singulair, Lipitor, phenobarbital, Effexor, ibuprofen, aspirin, multivitamins, Advair, Flonase, tramadol, pramipexole.  SURGICAL HISTORY:    Carpal tunnel release on the right side, hammer toe repair in 2012.  FAMILY MEDICAL HISTORY:   Positive for high blood pressure, otherwise negative.  SOCIAL HISTORY:     She does not smoke or drink. She is married, a retired Runner, broadcasting/film/video.  REVIEW OF SYSTEMS:    Positive for high blood pressure, asthma, seizures, otherwise negative 14 points.   [name] is an 61 y.o. female.   Chief Complaint: CTS HPI: see above  Past Medical History  Diagnosis Date  . Arthritis     knees  . GERD (gastroesophageal reflux disease)   . Hypertension     under control; has been on med. > 20 yrs.  . Restless leg syndrome   . 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 Carpal tunnel syndrome of left wrist 06/2011  . Aneurysm of internal carotid artery 1986    stent right ICA  . Asthma     prn inhaler; has not needed in > 1 yr.  . Seizures     due to cerebral aneurysm; no seizures since 1992    Past Surgical History  Procedure Date  . Abdominal  hysterectomy 1983    partial  . Carpal tunnel release 03/31/2007    right  . Foot surgery 09/2010    right  . Cerebral aneurysm repair 1986    History reviewed. No pertinent family history. Social History:  reports that she has quit smoking. She has never used smokeless tobacco. She reports that she drinks alcohol. She reports that she does not use illicit drugs.  Allergies:  Allergies  Allergen Reactions  . Compazine Shortness Of Breath  . Iohexol Hives and Shortness Of Breath       . Codeine Sulfate Nausea Only  . Adhesive (Tape) Rash    Medications Prior to Admission  Medication Dose Route Frequency Provider Last Rate Last Dose  . ceFAZolin (ANCEF) IVPB 1 g/50 mL premix  1 g Intravenous 60 min Pre-Op       . chlorhexidine (HIBICLENS) 4 % liquid 4 application  60 mL Topical Once       . lactated ringers infusion   Intravenous Continuous E. Jairo Ben, MD       Medications Prior to Admission  Medication Sig Dispense Refill  . albuterol (PROVENTIL HFA;VENTOLIN HFA) 108 (90 BASE) MCG/ACT inhaler Inhale 2 puffs into the lungs as needed.      [name] aspirin 81 MG tablet Take 81 mg by mouth daily. PM      .  cetirizine (ZYRTEC) 10 MG tablet Take 10 mg by mouth daily. AM      . fluticasone (FLONASE) 50 MCG/ACT nasal spray Place 2 sprays into the nose 2 (two) times daily.      [name] ibuprofen (ADVIL,MOTRIN) 600 MG tablet Take 600 mg by mouth 3 (three) times daily.      . montelukast (SINGULAIR) 10 MG tablet Take 10 mg by mouth at bedtime.      . pantoprazole (PROTONIX) 20 MG tablet Take 20 mg by mouth daily. AM      . PHENobarbital (LUMINAL) 64.8 MG tablet Take 64.8 mg by mouth at bedtime. 2 TABS      . pramipexole (MIRAPEX) 0.25 MG tablet Take 0.5 mg by mouth 3 (three) times daily.      . traMADol (ULTRAM) 50 MG tablet Take 50 mg by mouth every 6 (six) hours as needed.      . triamterene-hydrochlorothiazide (MAXZIDE) 75-50 MG per tablet Take 2 tablets by mouth daily. AM      .  venlafaxine (EFFEXOR) 75 MG tablet Take 75 mg by mouth 2 (two) times daily.        No results found for this or any previous visit (from the past 48 hour(s)).  No results found.   Pertinent items are noted in HPI.  Blood pressure 159/85, pulse 75, temperature 97.8 F (36.6 C), temperature source Oral, resp. rate 20, height 5\' 2"  (1.575 m), weight 90.719 kg (200 lb), SpO2 96.00%.  General appearance: alert, cooperative and appears stated age Head: Normocephalic, without obvious abnormality Neck: no adenopathy Resp: clear to auscultation bilaterally Cardio: regular rate and rhythm, S1, S2 normal, no murmur, click, rub or gallop GI: soft, non-tender; bowel sounds normal; no masses,  no organomegaly Extremities: extremities normal, atraumatic, no cyanosis or edema Pulses: 2+ and symmetric Skin: Skin color, texture, turgor normal. No rashes or lesions Neurologic: Grossly normal Incision/Wound: na  Assessment/Plan    Nerve conductions were performed by Dr. Johna Roles revealing a motor delay of 7.6, sensory delay of 4.8 with an amplitude of 5.7.     We have discussed possibility of surgical decompression and she would like to go ahead and have the left carpal tunnel operated on. She is aware there is no guarantee with the surgery, possibility of infection, recurrence, injury to arteries, nerves, tendons, incomplete relief of symptoms and dystrophy.  She is scheduled for left carpal tunnel release at [facility] as an outpatient.  Romar Woodrick R 06/19/2011, 12:09 PM

## 2011-06-19 NOTE — Anesthesia Procedure Notes (Signed)
Procedure Name: MAC Performed by: Jacqualyn Sedgwick Pre-anesthesia Checklist: Patient identified, Timeout performed, Emergency Drugs available, Suction available and Patient being monitored Patient Re-evaluated:Patient Re-evaluated prior to inductionOxygen Delivery Method: Simple face mask     

## 2011-06-20 NOTE — Op Note (Signed)
[name], [name]                ACCOUNT NO.:  [id number]  MEDICAL RECORD NO.:  [id number]  LOCATION:                                 FACILITY:  PHYSICIAN:  Cindee Salt, M.D.            DATE OF BIRTH:  DATE OF PROCEDURE:  06/19/2011 DATE OF DISCHARGE:                              OPERATIVE REPORT   PREOPERATIVE DIAGNOSES:  Carpal tunnel syndrome, left hand.  POSTOPERATIVE DIAGNOSIS:  Carpal tunnel syndrome, left hand.  OPERATION:  Decompression of left median nerve.  SURGEON:  Cindee Salt, MD  ANESTHESIA:  Forearm based IV regional with local infiltration.  ANESTHESIOLOGIST:  Janetta Hora. Gelene Mink, MD  HISTORY:  The patient is a 61 year old female with a history of carpal tunnel syndrome.  EMG nerve conduction is positive, which has not responded to conservative treatment.  She has elected to undergo surgical decompression.  Pre, peri, and postoperative course have been discussed along with risks and complications.  She is aware that there is no guarantee with the surgery, possibility of infection, recurrence of injury to arteries, nerves, tendons, incomplete relief of symptoms, and dystrophy.  In the preoperative area, the patient was seen, the extremity marked by both the patient and surgeon, and antibiotic given.  PROCEDURE:  The patient was brought to the operating room where a forearm-based IV regional anesthetic was carried out without difficulty. She was prepped using ChloraPrep, supine position with the left arm free.  A 3-minute dry time was allowed, time-out taken, confirming the patient and the procedure.  A longitudinal incision was made in the palm, carried down through the subcutaneous tissue.  Bleeders were electrocauterized.  Palmar fascia was split.  Superficial palmar arch was identified.  The flexor tendon of the ring and little finger identified to the ulnar side of the median nerve.  The carpal retinaculum was incised with sharp dissection.  Right angle  and Sewall retractor were placed between the skin and forearm fascia.  The fascia was released for approximately a centimeter and half proximal to the wrist crease under direct vision.  The canal was explored.  Air compression to the nerve was apparent.  No further lesions were identified.  The wound was irrigated.  The skin closed with interrupted 4-0 Vicryl Rapide sutures.  Local infiltration with 0.25% Marcaine without epinephrine was given, 6.5 mL was used.  Sterile compressive dressing was applied with the fingers free.  On deflation of the tourniquet, all fingers were immediately pinked.  She was taken to the recovery room for observation in satisfactory condition.          ______________________________ Cindee Salt, M.D.     GK/MEDQ  D:  06/19/2011  T:  06/20/2011  Job:  [id number]

## 2011-06-22 ENCOUNTER — Encounter (HOSPITAL_BASED_OUTPATIENT_CLINIC_OR_DEPARTMENT_OTHER): Payer: Self-pay | Admitting: Orthopedic Surgery

## 2012-05-31 ENCOUNTER — Other Ambulatory Visit (HOSPITAL_COMMUNITY)
Admission: RE | Admit: 2012-05-31 | Discharge: 2012-05-31 | Disposition: A | Discharge status: Home / Self Care | Payer: BC Managed Care – PPO | Source: Ambulatory Visit | Attending: Family Medicine | Admitting: Family Medicine

## 2012-05-31 ENCOUNTER — Other Ambulatory Visit: Payer: Self-pay | Admitting: Family Medicine

## 2012-05-31 DIAGNOSIS — Z1151 Encounter for screening for human papillomavirus (HPV): Secondary | ICD-10-CM | POA: Insufficient documentation

## 2012-05-31 DIAGNOSIS — Z124 Encounter for screening for malignant neoplasm of cervix: Secondary | ICD-10-CM | POA: Insufficient documentation

## 2012-06-02 ENCOUNTER — Other Ambulatory Visit: Payer: Self-pay | Admitting: Family Medicine

## 2012-06-07 ENCOUNTER — Ambulatory Visit
Admission: RE | Admit: 2012-06-07 | Discharge: 2012-06-07 | Disposition: A | Discharge status: Home / Self Care | Payer: BC Managed Care – PPO | Source: Ambulatory Visit | Attending: Family Medicine | Admitting: Family Medicine

## 2012-09-14 ENCOUNTER — Other Ambulatory Visit: Payer: Self-pay

## 2012-09-14 MED ORDER — PHENOBARBITAL 64.8 MG PO TABS: 129.6000 mg | ORAL_TABLET | Freq: Every day | ORAL | Status: DC

## 2012-09-14 NOTE — Telephone Encounter (Signed)
Former [facility] Patient has not been assigned new MD.  [facility] refill request to [facility]

## 2012-10-30 ENCOUNTER — Other Ambulatory Visit: Payer: Self-pay

## 2012-10-30 MED ORDER — VENLAFAXINE HCL ER 75 MG PO CP24: 75.0000 mg | ORAL_CAPSULE | Freq: Two times a day (BID) | ORAL | Status: DC

## 2012-10-30 NOTE — Telephone Encounter (Signed)
Former [facility] patient, has appt with Dr Vickey Huger in Sept.

## 2012-10-31 ENCOUNTER — Encounter: Payer: Self-pay | Admitting: Neurology

## 2012-10-31 NOTE — Progress Notes (Signed)
Quick Note:  Patient to return for a full night bipap study. Joud Pettinato, MD  ______ 

## 2012-12-31 ENCOUNTER — Other Ambulatory Visit: Payer: Self-pay

## 2012-12-31 MED ORDER — GABAPENTIN ENACARBIL ER 600 MG PO TBCR: 600.0000 mg | EXTENDED_RELEASE_TABLET | Freq: Every day | ORAL | Status: DC

## 2013-01-05 ENCOUNTER — Ambulatory Visit: Payer: Self-pay | Admitting: Neurology

## 2013-01-13 ENCOUNTER — Other Ambulatory Visit: Payer: Self-pay

## 2013-01-13 MED ORDER — PRAMIPEXOLE DIHYDROCHLORIDE 0.25 MG PO TABS: 0.2500 mg | ORAL_TABLET | Freq: Two times a day (BID) | ORAL | Status: DC

## 2013-01-13 NOTE — Telephone Encounter (Signed)
Former [facility] patient, assigned to Dr Vickey Huger.  Has an appt scheduled.

## 2013-01-20 ENCOUNTER — Encounter: Payer: Self-pay | Admitting: Neurology

## 2013-01-24 ENCOUNTER — Encounter: Payer: Self-pay | Admitting: Neurology

## 2013-01-24 ENCOUNTER — Ambulatory Visit (INDEPENDENT_AMBULATORY_CARE_PROVIDER_SITE_OTHER): Payer: BC Managed Care – PPO | Admitting: Neurology

## 2013-01-24 VITALS — BP 129/69 | HR 84 | Resp 16 | Ht 61.75 in | Wt 248.0 lb

## 2013-01-24 DIAGNOSIS — G4733 Obstructive sleep apnea (adult) (pediatric): Secondary | ICD-10-CM

## 2013-01-24 DIAGNOSIS — I671 Cerebral aneurysm, nonruptured: Secondary | ICD-10-CM

## 2013-01-24 DIAGNOSIS — G4730 Sleep apnea, unspecified: Secondary | ICD-10-CM

## 2013-01-24 DIAGNOSIS — G2581 Restless legs syndrome: Secondary | ICD-10-CM

## 2013-01-24 HISTORY — DX: Sleep apnea, unspecified: G47.30

## 2013-01-24 MED ORDER — PHENOBARBITAL 64.8 MG PO TABS: 129.6000 mg | ORAL_TABLET | Freq: Every day | ORAL | Status: DC

## 2013-01-24 MED ORDER — GABAPENTIN ENACARBIL ER 600 MG PO TBCR: 900.0000 mg | EXTENDED_RELEASE_TABLET | Freq: Once | ORAL | Status: DC

## 2013-01-24 MED ORDER — POTASSIUM CHLORIDE ER 10 MEQ PO TBCR: 10.0000 meq | EXTENDED_RELEASE_TABLET | Freq: Two times a day (BID) | ORAL | Status: DC

## 2013-01-24 NOTE — Patient Instructions (Addendum)
Seizure, Adult A seizure is abnormal electrical activity in the brain. Seizures can cause a change in attention or behavior (altered mental status). Seizures often involve uncontrollable shaking (convulsions). Seizures usually last from 30 seconds to 2 minutes. Epilepsy is a brain disorder in which a patient has repeated seizures over time. CAUSES  There are many different problems that can cause seizures. In some cases, no cause is found. Common causes of seizures include:  Head injuries.  Brain tumors.  Infections.  Imbalance of chemicals in the blood.  Kidney failure or liver failure.  Heart disease.  Drug abuse.  Stroke.  Withdrawal from certain drugs or alcohol.  Birth defects.  Malfunction of a neurosurgical device placed in the brain. SYMPTOMS  Symptoms vary depending on the part of the brain that is involved. Right before a seizure, you may have a warning (aura) that a seizure is about to occur. An aura may include the following symptoms:   Fear or anxiety.  Nausea.  Feeling like the room is spinning (vertigo).  Vision changes, such as seeing flashing lights or spots. Common symptoms during a seizure include:  Convulsions.  Drooling.  Rapid eye movements.  Grunting.  Loss of bladder and bowel control.  Bitter taste in the mouth. After a seizure, you may feel confused and sleepy. You may also have an injury resulting from convulsions during the seizure. DIAGNOSIS  Your caregiver will perform a physical exam and run some tests to determine the type and cause of your seizure. These tests may include:  Blood tests.  A lumbar puncture test. In this test, a small amount of fluid is removed from the spine and examined.  Electrocardiography (ECG). This test records the electrical activity in your heart.  Imaging tests, such as computed tomography (CT) scans or magnetic resonance imaging (MRI).  Electroencephalography (EEG). This test records the electrical  activity in your brain. TREATMENT  Seizures usually stop on their own. Treatment will depend on the cause of your seizure. In some cases, medicine may be given to prevent future seizures. HOME CARE INSTRUCTIONS   If you are given medicines, take them exactly as prescribed by your caregiver.  Keep all follow-up appointments as directed by your caregiver.  Do not swim or drive until your caregiver says it is okay.  Teach friends and family what to do if you have a seizure. They should:  Lay you on the ground to prevent a fall.  Put a cushion under your head.  Loosen any tight clothing around your neck.  Turn you on your side. If vomiting occurs, this helps keep your airway clear.  Stay with you until you recover. SEEK IMMEDIATE MEDICAL CARE IF:  The seizure lasts longer than 2 to 5 minutes.  The seizure is severe or the person does not wake up after the seizure.  The person has altered mental status. Drive the person to the emergency department or call your local emergency services (911 in [place] MAKE SURE YOU:  Understand these instructions.  Will watch your condition.  Will get help right away if you are not doing well or get worse. Document Released: 04/24/2000 Document Revised: 07/20/2011 Document Reviewed: 04/15/2011 [employer] Patient Information 2014 [employer].  

## 2013-01-24 NOTE — Progress Notes (Signed)
[facility]  Provider:  Melvyn Novas, M D  Referring Provider: No ref. provider found Primary Care Physician:  Emeterio Reeve, MD  Chief Complaint  Patient presents with  . Follow-up    OSA,rm 11    HPI:  [name] is a 62 y.o. female caucasian, right handed, and is seen here as a transfer of care  from Dr. Sandria Manly .    Dr. Sandria Manly had referred [name] for a sleep study on 11-19-2011. I had seen her for a sleep consult on 12-07-11 . He felt that he was likely suffering from sleep apnea based on her excessive daytime sleepiness and body mass index. The patient has a history of obesity, hypertension, cerebral aneurysm bleed, asthma, reflux disease a history of seizures and osteoarthritis. More sleep related for her complains of snoring, restless legs, witnessed apnea, disruptive snoring daytime napping . She had endorsed the Epworth sleepiness score at 16 points and the Reola Calkins'  eventually at 10 points , her BMI at the time of the study was 43.2.  The patient tested positive for severe, obstructive sleep apnea.  Her AHI was 73 and her RDI was  74.8/hr.  The REM AHI was only 57 , the supine AHI was 73/hr and the lowest oxygen saturation at nadir was 65%.  The patient was titrated to 12 cm water pressure and slept 103 minutes but the setting,  of which 45 minutes for REM sleep.  The AHI was now reduced to  1.2. She was referred to [facility] and has followed regularly with  PSG and RT , [employer].  The last download for this patient showed an 85% compliance over 180 days the residual AHI of 2.6 and therapy time per night at 5 hours and 26 minutes. The setting is still at 12 cm water.  Today's at for sleepiness score is 8 points much reduced in comparison to previous study time.  The patient has reported that she noticed a big difference in her level of alertness and in the quality of her sleep. She also endorsed a geriatric depression scale today at one point. Her  fatigue severity score is 20 points, with normal limits.  The patient reports that she is going to bed between 10 and 11 PM she arises in the morning normally around 7 and 8 AM. She azimuth that she gets about 8 hours of sleep and feels restored in the morning. She does not nap and daytime only rarely.She no longer snores, goes to the bathroom more than once at night, no witnessed apnea, and no morning headaches. She no,longer has the severe incapacitating headaches she used to have .   She feels stiff in the morning, but less than before she went through  PT for her arthritis and scoliosis.  Some nights she recalls her dreams. These ar not nightmarish or even vivid. No sleep walking or sleep talking.   She measured today at 248 pounds and has for the gained weight since her last visit her BMI has now reached 45. She does not feel that her appetite has increased but that her metabolism has slowed. She normally eats 2 meals a day, she has eliminated sodas and soft ramus and father brings water during daytime.   The patient stated she has the stamina to more dictation and the time to exercise but she has noted that she still has hot flashes as him reminiscent of menopausal symptoms doing exercises which make exercise uncomfortable.  Her past history under Dr Imagene Gurney care is noted in detail on 12-07-11 visit , sleep consult .  Right frontal craniotomy 1986 Dr. Jeral Fruit for multiple aneurysmata, right MCA trifurcation clipping. Left frontal craniotomy July 86,  left distal ICA clipping,  5 days after surgery she suffered a seizure.  Another 3 years later when trying to wean off phenobarbital, had a  recurrent 15 by 25 mm aneurysm in the right ICA.  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                Review of Systems: see above - Out of a complete 14 system review, the patient complains of only the following symptoms, and all other reviewed systems are negative. Obesity ,  continued weight gain, but sleepiness improved.   History   Social History  . Marital Status: Married    Spouse Name: [name]    Number of Children: 2  . Years of Education: college   Occupational History  . retired      Runner, broadcasting/film/video   Social History Main Topics  . Smoking status: Former Games developer  . Smokeless tobacco: Never Used     Comment: quit smoking > 30 yrs. ago  . Alcohol Use: Yes     Comment: rarely  . Drug Use: No  . Sexual Activity: Not on file   Other Topics Concern  . Not on file   Social History Narrative  . No narrative on file    Family History  Problem Relation Age of Onset  . Migraines Daughter     Past Medical History  Diagnosis Date  . Arthritis     knees  . GERD (gastroesophageal reflux disease)   . Hypertension     under control; has been on med. > 20 yrs.  . Restless leg syndrome   . 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 Carpal tunnel syndrome of left wrist 06/2011  . Aneurysm of internal carotid artery 1986    stent right ICA  . Seizures     due to cerebral aneurysm; no seizures since 1992  . OSA (obstructive sleep apnea)     AHl-over 70 and desaturations to 65% 02  . High cholesterol   . Headache(784.0)     migraines  . Asthma   . Knee pain     left  . Shingles   . Sleep apnea with use of continuous positive airway pressure (CPAP) 01/24/2013    Past Surgical History  Procedure Laterality Date  . Abdominal hysterectomy  1983    partial  . Carpal tunnel release  03/31/2007    right  . Foot surgery  09/2010    right  . Cerebral aneurysm repair  1986  . Carpal tunnel release  06/19/2011    Procedure: CARPAL TUNNEL RELEASE;  Surgeon: Nicki Reaper, MD;  Location: [facility];  Service: Orthopedics;  Laterality: Left;  . C sections    . Cranionotomies  09/1984-right,11/1984-left    2  . Hammer toe surgery      right CTS release,left CTS release 09/2011    Current Outpatient Prescriptions  Medication Sig  Dispense Refill  . albuterol (PROVENTIL HFA;VENTOLIN HFA) 108 (90 BASE) MCG/ACT inhaler Inhale 2 puffs into the lungs as needed.      [name] amLODipine-benazepril (LOTREL) 5-20 MG per capsule       . aspirin 81 MG tablet Take 81 mg by mouth daily. PM      .  Calcium Carbonate-Vitamin D (CALCIUM 600 + D PO) Take by mouth. 1 tablet in am , 1 tablet in pm      . carbonyl iron ([facility] IRON) 45 MG TABS tablet Take by mouth. 1 am, 1 pm      . cetirizine (ZYRTEC) 10 MG tablet Take 10 mg by mouth daily. AM      . doxycycline (ORACEA) 40 MG capsule Take 40 mg by mouth every morning.      . fluticasone (FLONASE) 50 MCG/ACT nasal spray Place 2 sprays into the nose 2 (two) times daily.      [name] HYDROcodone-acetaminophen (NORCO/VICODIN) 5-325 MG per tablet       . ibuprofen (ADVIL,MOTRIN) 600 MG tablet Take 600 mg by mouth 3 (three) times daily.      . IRON, FERROUS GLUCONATE, PO Take 45 mg by mouth. Slow release, 1 am      . levocetirizine (XYZAL) 5 MG tablet       . meloxicam (MOBIC) 15 MG tablet       . montelukast (SINGULAIR) 10 MG tablet Take 10 mg by mouth at bedtime.      . Multiple Vitamin (MULTIVITAMIN) capsule Take 1 capsule by mouth daily.      . pantoprazole (PROTONIX) 20 MG tablet Take 20 mg by mouth daily. AM      . PHENobarbital (LUMINAL) 64.8 MG tablet Take 2 tablets (129.6 mg total) by mouth at bedtime.  60 tablet  5  . potassium chloride (KLOR-CON 10) 10 MEQ tablet Take 10 mEq by mouth 2 (two) times daily. 1 am,1pm      . pramipexole (MIRAPEX) 0.25 MG tablet Take 1 tablet (0.25 mg total) by mouth 2 (two) times daily.  180 tablet  0  . pramipexole (MIRAPEX) 0.75 MG tablet Take 0.75 mg by mouth daily. Evening dosage      . rosuvastatin (CRESTOR) 20 MG tablet Take 20 mg by mouth daily.      . traMADol (ULTRAM) 50 MG tablet Take 50 mg by mouth every 6 (six) hours as needed.      . triamterene-hydrochlorothiazide (MAXZIDE) 75-50 MG per tablet Take 2 tablets by mouth daily. AM      . venlafaxine XR  (EFFEXOR-XR) 75 MG 24 hr capsule Take 1 capsule (75 mg total) by mouth 2 (two) times daily.  60 capsule  3  . VITAMIN D, CHOLECALCIFEROL, PO Take 1,000 Units by mouth. 3 in am       No current facility-administered medications for this visit.    Allergies as of 01/24/2013 - Review Complete 01/24/2013  Allergen Reaction Noted  . Compazine Shortness Of Breath 06/19/2011  . Iohexol Hives and Shortness Of Breath 03/13/2003  . Prochlorperazine maleate  01/20/2013  . Topamax [topiramate]  01/20/2013  . Codeine sulfate Nausea Only 06/19/2011  . Adhesive [tape] Rash 06/12/2011    Vitals: BP 129/69  Pulse 84  Resp 16  Ht 5' 1.75" (1.568 m)  Wt 248 lb (112.492 kg)  BMI 45.75 kg/m2 Last Weight:  Wt Readings from Last 1 Encounters:  01/24/13 248 lb (112.492 kg)   Last Height:   Ht Readings from Last 1 Encounters:  01/24/13 5' 1.75" (1.568 m)   Physical exam:  General: The patient is awake, alert and appears not in acute distress. The patient is well groomed. Head: Normocephalic, atraumatic. Neck is supple. Mallampati 3 , neck circumference: 16.75 ,  No nnasal deviation, TMJ no.  Cardiovascular:  Regular rate and rhythm, without  murmurs or carotid bruit, and without distended neck veins. Respiratory: Lungs are clear to auscultation. Skin:  Without evidence of edema, or rash Trunk: BMI is elevated , patient  has normal posture.  Neurologic exam : The patient is awake and alert, oriented to place and time.  Memory subjective  described as intact. There is a normal attention span & concentration ability. Speech is fluent without  dysarthria, dysphonia or aphasia. Mood and affect are appropriate.  Cranial nerves: Pupils are equal and briskly reactive to light. Funduscopic exam without  evidence of pallor or edema. Extraocular movements  in vertical and horizontal planes intact and without nystagmus. Visual fields by finger perimetry are intact. Hearing to finger rub intact.  Facial  sensation intact to fine touch. Facial motor strength is symmetric and tongue and uvula move midline.  Motor exam:   Normal tone and normal muscle bulk and symmetric normal strength in all extremities.  Sensory:  Fine touch, pinprick and vibration were tested in all extremities. Proprioception is  normal.  Coordination: Rapid alternating movements in the fingers/hands is tested and normal. Finger-to-nose maneuver tested and normal without evidence of ataxia, dysmetria or tremor.  Gait and station: Patient walks without assistive device -Strength within normal limits. Stance is stable and normal. Tandem gait is  unfragmented. Romberg testing is normal.  Deep tendon reflexes: in the  upper and lower extremities are symmetric and intact. Babinski  downgoing.   Assessment: Treated by the current settings of CPAP. The residual AHI from her last download is excellent at 2.6. The patient is compliant. She also risk factor is her elevated body mass index, and we'll try to establish a different nutritional plan was for her to allow for a continued weight loss.  The patient is a history of multiple aneurysms: The last intervention was in 2007 with Dr. Algis Downs. would like for her to have a repeat CT angiogram to make sure that in new lesions have evolved.   Plan:  Continue CPAP. :

## 2013-01-25 ENCOUNTER — Other Ambulatory Visit: Payer: Self-pay | Admitting: *Deleted

## 2013-01-25 DIAGNOSIS — G4733 Obstructive sleep apnea (adult) (pediatric): Secondary | ICD-10-CM

## 2013-01-25 DIAGNOSIS — I671 Cerebral aneurysm, nonruptured: Secondary | ICD-10-CM

## 2013-01-26 ENCOUNTER — Other Ambulatory Visit (INDEPENDENT_AMBULATORY_CARE_PROVIDER_SITE_OTHER): Payer: Self-pay

## 2013-01-26 ENCOUNTER — Other Ambulatory Visit: Payer: Self-pay | Admitting: Neurology

## 2013-01-26 ENCOUNTER — Other Ambulatory Visit: Payer: Self-pay | Admitting: *Deleted

## 2013-01-26 DIAGNOSIS — G4733 Obstructive sleep apnea (adult) (pediatric): Secondary | ICD-10-CM

## 2013-01-26 DIAGNOSIS — I671 Cerebral aneurysm, nonruptured: Secondary | ICD-10-CM

## 2013-01-26 DIAGNOSIS — Z0289 Encounter for other administrative examinations: Secondary | ICD-10-CM

## 2013-01-27 ENCOUNTER — Ambulatory Visit
Admission: RE | Admit: 2013-01-27 | Discharge: 2013-01-27 | Disposition: A | Discharge status: Home / Self Care | Payer: BC Managed Care – PPO | Source: Ambulatory Visit | Attending: Neurology | Admitting: Neurology

## 2013-01-27 DIAGNOSIS — G4730 Sleep apnea, unspecified: Secondary | ICD-10-CM

## 2013-01-27 DIAGNOSIS — I671 Cerebral aneurysm, nonruptured: Secondary | ICD-10-CM

## 2013-01-27 LAB — CREATININE, SERUM: Creatinine, Ser: 0.89 mg/dL (ref 0.57–1.00)

## 2013-01-27 LAB — BUN: BUN: 14 mg/dL (ref 8–27)

## 2013-01-27 MED ORDER — IOHEXOL 350 MG/ML SOLN
100.0000 mL | Freq: Once | INTRAVENOUS | Status: AC | PRN
  Administered 2013-01-27: 100 mL via INTRAVENOUS

## 2013-02-14 ENCOUNTER — Encounter: Payer: Self-pay | Admitting: Neurology

## 2013-04-04 ENCOUNTER — Telehealth: Payer: Self-pay | Admitting: *Deleted

## 2013-04-04 MED ORDER — PRAMIPEXOLE DIHYDROCHLORIDE 0.25 MG PO TABS: 0.2500 mg | ORAL_TABLET | Freq: Two times a day (BID) | ORAL | Status: DC

## 2013-04-04 NOTE — Telephone Encounter (Signed)
Rx sent to both pharmacies per patient request  

## 2013-04-15 ENCOUNTER — Other Ambulatory Visit: Payer: Self-pay | Admitting: Neurology

## 2013-04-28 ENCOUNTER — Telehealth: Payer: Self-pay

## 2013-04-28 ENCOUNTER — Encounter: Payer: Self-pay | Admitting: Neurology

## 2013-04-28 ENCOUNTER — Ambulatory Visit (INDEPENDENT_AMBULATORY_CARE_PROVIDER_SITE_OTHER): Payer: BC Managed Care – PPO | Admitting: Neurology

## 2013-04-28 VITALS — BP 119/62 | HR 71 | Resp 16 | Ht 61.0 in | Wt 260.0 lb

## 2013-04-28 DIAGNOSIS — I671 Cerebral aneurysm, nonruptured: Secondary | ICD-10-CM

## 2013-04-28 DIAGNOSIS — G2581 Restless legs syndrome: Secondary | ICD-10-CM

## 2013-04-28 DIAGNOSIS — G609 Hereditary and idiopathic neuropathy, unspecified: Secondary | ICD-10-CM

## 2013-04-28 DIAGNOSIS — G4730 Sleep apnea, unspecified: Secondary | ICD-10-CM

## 2013-04-28 DIAGNOSIS — G4733 Obstructive sleep apnea (adult) (pediatric): Secondary | ICD-10-CM

## 2013-04-28 HISTORY — DX: Restless legs syndrome: G25.81

## 2013-04-28 MED ORDER — PRAMIPEXOLE DIHYDROCHLORIDE 0.25 MG PO TABS: 0.2500 mg | ORAL_TABLET | Freq: Two times a day (BID) | ORAL | Status: DC

## 2013-04-28 MED ORDER — PRAMIPEXOLE DIHYDROCHLORIDE 0.75 MG PO TABS: 0.7500 mg | ORAL_TABLET | Freq: Every day | ORAL | Status: DC

## 2013-04-28 MED ORDER — GABAPENTIN ENACARBIL ER 600 MG PO TBCR: 900.0000 mg | EXTENDED_RELEASE_TABLET | Freq: Once | ORAL | Status: DC

## 2013-04-28 NOTE — Patient Instructions (Signed)
Sleep Apnea  Sleep apnea is disorder that affects a person's sleep. A person with sleep apnea has abnormal pauses in their breathing when they sleep. It is hard for them to get a good sleep. This makes a person tired during the day. It also can lead to other physical problems. There are three types of sleep apnea. One type is when breathing stops for a short time because your airway is blocked (obstructive sleep apnea). Another type is when the brain sometimes fails to give the normal signal to breathe to the muscles that control your breathing (central sleep apnea). The third type is a combination of the other two types.  HOME CARE  · Do not sleep on your back. Try to sleep on your side.  · Take all medicine as told by your doctor.  · Avoid alcohol, calming medicines (sedatives), and depressant drugs.  · Try to lose weight if you are overweight. Talk to your doctor about a healthy weight goal.  Your doctor may have you use a device that helps to open your airway. It can help you get the air that you need. It is called a positive airway pressure (PAP) device. There are three types of PAP devices:  · Continuous positive airway pressure (CPAP) device.  · Nasal expiratory positive airway pressure (EPAP) device.  · Bilevel positive airway pressure (BPAP) device.  MAKE SURE YOU:  · Understand these instructions.  · Will watch your condition.  · Will get help right away if you are not doing well or get worse.  Document Released: 02/04/2008 Document Revised: 04/13/2012 Document Reviewed: 08/29/2011  [employer]® Patient Information ©2014 [employer].

## 2013-04-28 NOTE — Telephone Encounter (Signed)
Rx faxed

## 2013-04-28 NOTE — Progress Notes (Signed)
[facility]  Provider:  Melvyn Novas, M D  Referring Provider: Emeterio Reeve, MD Primary Care Physician:  Emeterio Reeve, MD  Chief Complaint  Patient presents with  . Sleep [place]    3 mo f/u    HPI:  [name] is a 62 y.o. female  Is seen here as a referral/ revisit  from Dr. Paulino Rily for CPAP compliance.   interval history : The patient underwent her routine brain CT CT angiogram that showed no changes to the previous 2 imaging studies. As a premedication she will take prednisone but was listed as a medication but is not date regimen.  As to her sleep disorder she endorsed today the Epworth Sleepiness Scale at 11 times the fatigue severity scale of 33 points and the depression score at one point. The  durable medical equipment company is [facility] followed by [employer].  Her AHI on her sleep study was 73 and  her AHI noted on the download was 2.6 per hour ,over  180 days the compliance range was 85% at an average daily therapy time on CPAP at 12 cm water- of 5 hours and 26 minutes.  The has to be nor adjustment or resetting of her CPAP machine. The patient is compliant by CMS criteria, downloaded 04-24-13 . [facility]. Start using CPAP when napping on Saturdays .        Review of Systems: Out of a complete 14 system review, the patient complains of only the following symptoms, and all other reviewed systems are negative. Epworth score of 11 pints, some restless legs, some diarrhea,  some back pain,  bladder incontinence,  urinary urgency, headaches , no tobacco, ETOH or  recreational drug use. Dry mouth after Detrol. No nocturia , but hourly urination in daytime.    History   Social History  . Marital Status: Married    Spouse Name: Chrissie Noa    Number of Children: 2  . Years of Education: college   Occupational History  . retired      Runner, broadcasting/film/video   Social History Main Topics  . Smoking status: Former Games developer  . Smokeless tobacco: Never  Used     Comment: quit smoking > 30 yrs. ago  . Alcohol Use: Yes     Comment: rarely  . Drug Use: No  . Sexual Activity: Not on file   Other Topics Concern  . Not on file   Social History Narrative  . No narrative on file    Family History  Problem Relation Age of Onset  . Migraines Daughter   . Dementia Mother   . Uterine cancer Mother   . Lung cancer Father     Past Medical History  Diagnosis Date  . Arthritis     knees  . GERD (gastroesophageal reflux disease)   . Hypertension     under control; has been on med. > 20 yrs.  . Restless leg syndrome   . 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 Carpal tunnel syndrome of left wrist 06/2011  . Aneurysm of internal carotid artery 1986    stent right ICA  . Seizures     due to cerebral aneurysm; no seizures since 1992  . OSA (obstructive sleep apnea)     AHl-over 70 and desaturations to 65% 02  . High cholesterol   . Headache(784.0)     migraines  . Asthma   . Knee pain  left  . Shingles   . Sleep apnea with use of continuous positive airway pressure (CPAP) 01/24/2013    Past Surgical History  Procedure Laterality Date  . Abdominal hysterectomy  1983    partial  . Carpal tunnel release  03/31/2007    right  . Foot surgery  09/2010    right  . Cerebral aneurysm repair  1986  . Carpal tunnel release  06/19/2011    Procedure: CARPAL TUNNEL RELEASE;  Surgeon: Nicki Reaper, MD;  Location: [facility];  Service: Orthopedics;  Laterality: Left;  . C sections    . Cranionotomies  09/1984-right,11/1984-left    2  . Hammer toe surgery      right CTS release,left CTS release 09/2011    Current Outpatient Prescriptions  Medication Sig Dispense Refill  . amLODipine-benazepril (LOTREL) 5-20 MG per capsule       . aspirin 81 MG tablet Take 81 mg by mouth daily. PM      . Calcium Carbonate-Vitamin D (CALCIUM 600 + D PO) Take by mouth. 1 tablet in am , 1 tablet in pm      . carbonyl iron ([facility]  IRON) 45 MG TABS tablet Take by mouth. 1 am, 1 pm      . cetirizine (ZYRTEC) 10 MG tablet Take 10 mg by mouth daily. AM      . doxycycline (ORACEA) 40 MG capsule Take 40 mg by mouth every morning.      . fluticasone (FLONASE) 50 MCG/ACT nasal spray Place 2 sprays into the nose 2 (two) times daily.      . Gabapentin Enacarbil (HORIZANT) 600 MG TB24 Take 900 mg by mouth once.  90 tablet  3  . IRON, FERROUS GLUCONATE, PO Take 45 mg by mouth. Slow release, 1 am      . levocetirizine (XYZAL) 5 MG tablet       . meloxicam (MOBIC) 15 MG tablet       . montelukast (SINGULAIR) 10 MG tablet Take 10 mg by mouth at bedtime.      . Multiple Vitamin (MULTIVITAMIN) capsule Take 1 capsule by mouth daily.      . pantoprazole (PROTONIX) 20 MG tablet Take 20 mg by mouth daily. AM      . PHENobarbital (LUMINAL) 64.8 MG tablet Take 2 tablets (129.6 mg total) by mouth at bedtime.  180 tablet  3  . potassium chloride (KLOR-CON 10) 10 MEQ tablet Take 1 tablet (10 mEq total) by mouth 2 (two) times daily. 1 am,1pm  180 tablet  3  . pramipexole (MIRAPEX) 0.25 MG tablet Take 1 tablet (0.25 mg total) by mouth 2 (two) times daily.  180 tablet  3  . rosuvastatin (CRESTOR) 20 MG tablet Take 20 mg by mouth daily.      . traMADol (ULTRAM) 50 MG tablet Take 50 mg by mouth every 6 (six) hours as needed.      . triamterene-hydrochlorothiazide (MAXZIDE) 75-50 MG per tablet Take 2 tablets by mouth daily. AM      . venlafaxine XR (EFFEXOR-XR) 75 MG 24 hr capsule TAKE ONE CAPSULE BY MOUTH TWICE A DAY  60 capsule  0  . VITAMIN D, CHOLECALCIFEROL, PO Take 1,000 Units by mouth. 3 in am      . albuterol (PROVENTIL HFA;VENTOLIN HFA) 108 (90 BASE) MCG/ACT inhaler Inhale 2 puffs into the lungs as needed.       No current facility-administered medications for this visit.    Allergies as  of 04/28/2013 - Review Complete 04/28/2013  Allergen Reaction Noted  . Compazine Shortness Of Breath 06/19/2011  . Iohexol Hives and Shortness Of Breath  03/13/2003  . Prochlorperazine maleate  01/20/2013  . Topamax [topiramate]  01/20/2013  . Codeine sulfate Nausea Only 06/19/2011  . Adhesive [tape] Rash 06/12/2011    Vitals: BP 119/62  Pulse 71  Resp 16  Ht 5\' 1"  (1.549 m)  Wt 260 lb (117.935 kg)  BMI 49.15 kg/m2 Last Weight:  Wt Readings from Last 1 Encounters:  04/28/13 260 lb (117.935 kg)   Last Height:   Ht Readings from Last 1 Encounters:  04/28/13 5\' 1"  (1.549 m)    Physical exam:  General: The patient is awake, alert and appears not in acute distress. The patient is well groomed. Head: Normocephalic, atraumatic. Neck is supple. Mallampati 3, neck circumference: 18 Cardiovascular:  Regular rate and rhythm , without  murmurs or carotid bruit, and without distended neck veins. Respiratory: Lungs are clear to auscultation. Skin:  Without evidence of edema, or rash Trunk: BMI is  Elevated at 45 ,  the patient  has normal posture.  Neurologic exam : The patient is awake and alert, oriented to place and time.  Memory subjective  described as intact.  There is a normal attention span & concentration ability. Speech is fluent without  dysarthria, dysphonia or aphasia.  Mood and affect are appropriate.  Cranial nerves: Pupils are equal and briskly reactive to light. Extraocular movements  in vertical and horizontal planes intact and without nystagmus. Visual fields by finger perimetry are intact. Hearing to finger rub intact.  Facial sensation intact to fine touch. Facial motor strength is symmetric and tongue and uvula move midline.  Motor exam:  symmetric normal strength in all extremities.  Sensory:  Fine touch, pinprick and vibration were tested in all extremities. Proprioception isnormal.  Coordination: Rapid alternating movements in the fingers/hands is  normal. Finger-to-nose maneuverwithout evidence of ataxia, dysmetria or tremor.  Gait and station: Patient walks without assistive device  Deep tendon reflexes: in  the  upper and lower extremities are symmetric and intact.  Assessment:  After physical and neurologic examination, review of laboratory studies, imaging, neurophysiology testing and pre-existing records, assessment is  1) OSA patient is compliant with CPAP at 12 cm water , we discussed EPR for her , I would like for her to try 2 cm flex.  2) urinary urge and nocturia, improved.  3) obesity: exercise and diet discussed . 4 ) RLS on Mirapex. 5) Neuronin for NP . 6) frequent bronchitis- COPD.   Plan:  RV in 6 month ,than yearly. May see NP or me .   PS:  Last visit note   HPI:  THRESEA DOBLE is a 62 y.o. female caucasian, right handed, and is seen here as a transfer of care  from Dr. Sandria Manly .  Dr. Sandria Manly had referred Mrs. Glockner for a sleep study on 11-19-2011. I had seen her for a sleep consult on 12-07-11 . He felt that he was likely suffering from sleep apnea based on her excessive daytime sleepiness and body mass index. The patient has a history of obesity, hypertension, cerebral aneurysm bleed, asthma, reflux disease a history of seizures and osteoarthritis. More sleep related for her complains of snoring, restless legs, witnessed apnea, disruptive snoring daytime napping .  Sleep study took place  11-19-11 : She had endorsed the Epworth sleepiness score at 16 points and the Beck'sat 10 points , her BMI  at the time of the study was 43.2.  The patient tested positive for severe, obstructive sleep apnea. Her AHI was 73 and her RDI was  74.8/hr.  The REM AHI was only 57 , the supine AHI was 73 /hr. and the lowest oxygen saturation at nadir was 65%.The patient was titrated to 12 cm water pressure and slept 103 minutes but the setting,  of which 45 minutes were  REM sleep.  The AHI was now reduced to  1.2. She was referred to [facility] and has followed regularly with  PSG and RT , [employer].  The last download for this patient showed an 85% compliance over 180 days the residual AHI of 2.6 and  therapy time per night at 5 hours and 26 minutes. The setting is still at 12 cm water.  The  sleepiness score is 8 points much reduced in comparison to previous study time.  The patient has reported that she noticed a big difference in her level of alertness and in the quality of her sleep. She also endorsed a geriatric depression scale today at one point.  Her fatigue severity score is 20 points, with normal limits.  The patient reports that she is going to bed between 10 and 11 PM she arises in the morning normally around 7 and 8 AM.  Shegets about 8 hours of sleep and feels restored in the morning. She does not nap in  daytime only rarely of weekends .She no longer snores on CPAP , goes to the bathroom more than once at night, no witnessed apnea, and no morning headaches. No longer  has the severe incapacitating headaches she used to have .   She feels stiff in the morning, but less than before she went through  PT for her arthritis and scoliosis. Some nights she recalls her dreams. These ar not nightmarish or even vivid. No sleep walking or sleep talking.   She measured today at 248 pounds and has for the gained weight since her last visit her BMI has now reached 45. She does not feel that her appetite has increased but that her metabolism has slowed. She normally eats 2 meals a day, she has eliminated sodas and soft ramus and father brings water during daytime.  The patient stated she has the stamina to more  exercise , but she has noted that  hot flashes  reminiscent of menopausal symptoms during exercises,  which make exercise uncomfortable.    Her past history under Dr Imagene Gurney care is noted in detail on 12-07-11 visit , sleep consult .  Right frontal craniotomy 1986 Dr. Jeral Fruit for multiple aneurysmata, right MCA trifurcation clipping. Left frontal craniotomy July 86,  left distal ICA clipping,  5 days after surgery she suffered a seizure.  Another 3 years later when trying to wean off  phenobarbital, had a  recurrent 15 by 25 mm aneurysm in the right ICA.  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

## 2013-04-28 NOTE — Telephone Encounter (Signed)
Rx faxed Mirapex and gabapentin

## 2013-04-28 NOTE — Telephone Encounter (Signed)
2 Rx faxed

## 2013-05-01 ENCOUNTER — Encounter: Payer: Self-pay | Admitting: Neurology

## 2013-06-10 ENCOUNTER — Other Ambulatory Visit: Payer: Self-pay | Admitting: Neurology

## 2013-06-30 ENCOUNTER — Other Ambulatory Visit: Payer: Self-pay

## 2013-06-30 NOTE — Telephone Encounter (Signed)
Former [facility] patient assigned to Dr Vickey Hugerohmeier.  He prescribed this med for many years.

## 2013-07-03 MED ORDER — TRIAMTERENE-HCTZ 75-50 MG PO TABS: 1.0000 | ORAL_TABLET | Freq: Every day | ORAL | Status: DC

## 2013-08-30 ENCOUNTER — Other Ambulatory Visit: Payer: Self-pay | Admitting: Neurology

## 2013-08-31 NOTE — Telephone Encounter (Signed)
Rx signed and faxed.

## 2013-10-25 ENCOUNTER — Encounter: Payer: Self-pay | Admitting: Nurse Practitioner

## 2013-10-25 ENCOUNTER — Ambulatory Visit (INDEPENDENT_AMBULATORY_CARE_PROVIDER_SITE_OTHER): Payer: BC Managed Care – PPO | Admitting: Nurse Practitioner

## 2013-10-25 VITALS — BP 125/63 | HR 66 | Wt 260.2 lb

## 2013-10-25 DIAGNOSIS — G4733 Obstructive sleep apnea (adult) (pediatric): Secondary | ICD-10-CM

## 2013-10-25 DIAGNOSIS — G609 Hereditary and idiopathic neuropathy, unspecified: Secondary | ICD-10-CM

## 2013-10-25 DIAGNOSIS — G2581 Restless legs syndrome: Secondary | ICD-10-CM

## 2013-10-25 DIAGNOSIS — Z9989 Dependence on other enabling machines and devices: Principal | ICD-10-CM

## 2013-10-25 DIAGNOSIS — I671 Cerebral aneurysm, nonruptured: Secondary | ICD-10-CM

## 2013-10-25 NOTE — Progress Notes (Signed)
I agree with the assessment and plan as directed by NP .The patient is known to me .   DOHMEIER,CARMEN, MD  

## 2013-10-25 NOTE — Progress Notes (Signed)
PATIENT: [name] DOB: 12-19-1950  REASON FOR VISIT: routine follow up for CPAP, RLS HISTORY FROM: patient  HISTORY OF PRESENT ILLNESS: [name] is a 63 y.o. female Is seen here as a referral/ revisit from Dr. Paulino RilyWolters for CPAP compliance.   Interval history:  As to her sleep disorder she endorsed today the Epworth Sleepiness Scale at 8 today which was 11 previously and  the fatigue severity scale of 28 today which was 33 points previously and the depression score at one point, unchanged.  The durable medical equipment company is [facility] followed by [name].  Her AHI on her sleep study was 73 and her AHI noted on the download was 1.9 per hour, over 30 days the compliance range was 96.7% at an average daily therapy time on CPAP at 12 cm water-  Was 4 hours and 26 minutes. The patient is compliant by CMS criteria, downloaded 10/25/13. She states she doing well with her cpap and now her husband is on cpap too.  RLS and neuropathy is well controlled on current regimen.  She has no new complaints today.  Prior HPI: Dr. Sandria ManlyLove had referred [name] for a sleep study on 11-19-2011. I had seen her for a sleep consult on 12-07-11 .  He felt that he was likely suffering from sleep apnea based on her excessive daytime sleepiness and body mass index. The patient has a history of obesity, hypertension, cerebral aneurysm bleed, asthma, reflux disease a history of seizures and osteoarthritis. More sleep related for her complains of snoring, restless legs, witnessed apnea, disruptive snoring daytime napping .  Sleep study took place 11-19-11 :  She had endorsed the Epworth sleepiness score at 16 points and the Beck'sat 10 points , her BMI at the time of the study was 43.2.  The patient tested positive for severe, obstructive sleep apnea. Her AHI was 73 and her RDI was 74.8/hr. The REM AHI was only 57 , the supine AHI was 73 /hr. and the lowest oxygen saturation at nadir was 65%.The patient  was titrated to 12 cm water pressure and slept 103 minutes but the setting, of which 45 minutes were REM sleep.  The AHI was now reduced to 1.2. She was referred to [facility] and has followed regularly with PSG and R , [facility] [name].  The last download for this patient showed an 85% compliance over 180 days the residual AHI of 2.6 and therapy time per night at 5 hours and 26 minutes. The setting is still at 12 cm water.  The sleepiness score is 8 points much reduced in comparison to previous study time.  The patient has reported that she noticed a big difference in her level of alertness and in the quality of her sleep. She also endorsed a geriatric depression scale today at one point.  Her fatigue severity score is 20 points, with normal limits.  The patient reports that she is going to bed between 10 and 11 PM she arises in the morning normally around 7 and 8 AM.  She gets about 8 hours of sleep and feels restored in the morning. She does not nap in daytime only rarely of weekends .She no longer snores on CPAP , goes to the bathroom more than once at night, no witnessed apnea, and no morning headaches. No longer has the severe incapacitating headaches she used to have . She feels stiff in the morning, but less than before she went through PT for her  arthritis and scoliosis. Some nights she recalls her dreams. These ar not nightmarish or even vivid. No sleep walking or sleep talking.  She measured today at 248 pounds and has for the gained weight since her last visit her BMI has now reached 45. She does not feel that her appetite has increased but that her metabolism has slowed.  She normally eats 2 meals a day, she has eliminated sodas and soft ramus and father brings water during daytime.  The patient stated she has the stamina to exercise, but she has noted that hot flashes reminiscent of menopausal symptoms during exercises, which make exercise uncomfortable.  Her past history under Dr Imagene Gurney care is  noted in detail on 12-07-11 visit , sleep consult .  Right frontal craniotomy 1986 Dr. Jeral Fruit for multiple aneurysmata, right MCA trifurcation clipping. Left frontal craniotomy July 86, left distal ICA clipping, 5 days after surgery she suffered a seizure. Another 3 years later when trying to wean off phenobarbital, had a recurrent 15 by 25 mm aneurysm in the right ICA. 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   REVIEW OF SYSTEMS: Full 14 system review of systems performed and notable only for:  Epworth score of 11 points, some restless legs, some diarrhea, some back pain, bladder incontinence, urinary urgency, headaches , no tobacco, ETOH or recreational drug use.  Dry mouth after Detrol. No nocturia , but hourly urination in daytime.    ALLERGIES: Allergies  Allergen Reactions  . Compazine Shortness Of Breath  . Iohexol Hives and Shortness Of Breath       . Prochlorperazine Maleate   . Topamax [Topiramate]   . Codeine Sulfate Nausea Only  . Adhesive [Tape] Rash    HOME MEDICATIONS: Outpatient Prescriptions Prior to Visit  Medication Sig Dispense Refill  . albuterol (PROVENTIL HFA;VENTOLIN HFA) 108 (90 BASE) MCG/ACT inhaler Inhale 2 puffs into the lungs as needed.      [name] amLODipine-benazepril (LOTREL) 5-20 MG per capsule       . aspirin 81 MG tablet Take 81 mg by mouth daily. PM      . Calcium Carbonate-Vitamin D (CALCIUM 600 + D PO) Take by mouth. 1 tablet in am , 1 tablet in pm      . cetirizine (ZYRTEC) 10 MG tablet Take 10 mg by mouth daily. AM      . doxycycline (ORACEA) 40 MG capsule Take 40 mg by mouth every morning.      . fluticasone (FLONASE) 50 MCG/ACT nasal spray Place 2 sprays into the nose 2 (two) times daily.      . IRON, FERROUS GLUCONATE, PO Take 45 mg by mouth. Slow release, 1 am      . levocetirizine (XYZAL) 5 MG tablet       . meloxicam (MOBIC) 15 MG tablet       . montelukast (SINGULAIR) 10 MG tablet Take 10 mg by mouth at bedtime.      .  Multiple Vitamin (MULTIVITAMIN) capsule Take 1 capsule by mouth daily.      . pantoprazole (PROTONIX) 20 MG tablet Take 20 mg by mouth daily. AM      . PHENobarbital (LUMINAL) 64.8 MG tablet TAKE 2 TABLETS BY MOUTH AT BEDTIME  180 tablet  1  . potassium chloride (KLOR-CON 10) 10 MEQ tablet Take 1 tablet (10 mEq total) by mouth 2 (two) times daily. 1 am,1pm  180 tablet  3  . pramipexole (MIRAPEX) 0.25 MG tablet Take 1 tablet (0.25 mg total)  by mouth 2 (two) times daily.  180 tablet  3  . pramipexole (MIRAPEX) 0.75 MG tablet Take 1 tablet (0.75 mg total) by mouth daily. Evening dosage  90 tablet  3  . rosuvastatin (CRESTOR) 20 MG tablet Take 20 mg by mouth daily.      . traMADol (ULTRAM) 50 MG tablet Take 50 mg by mouth every 6 (six) hours as needed.      . triamterene-hydrochlorothiazide (MAXZIDE) 75-50 MG per tablet Take 1 tablet by mouth daily. AM  90 tablet  3  . VITAMIN D, CHOLECALCIFEROL, PO Take 1,000 Units by mouth. 3 in am      . carbonyl iron ([facility] IRON) 45 MG TABS tablet Take by mouth. 1 am, 1 pm      . Gabapentin Enacarbil (HORIZANT) 600 MG TBCR Take 900 mg by mouth once.  135 tablet  3  . venlafaxine XR (EFFEXOR-XR) 75 MG 24 hr capsule TAKE ONE CAPSULE BY MOUTH TWICE A DAY  60 capsule  6   No facility-administered medications prior to visit.     PHYSICAL EXAM  Filed Vitals:   10/25/13 0945  BP: 125/63  Pulse: 66  Weight: 260 lb 3.2 oz (118.026 kg)   Body mass index is 49.19 kg/(m^2). No exam data present   Generalized: Well developed, in no acute distress, obese Caucasian female Head: Normocephalic, atraumatic.  Neck is supple. Mallampati 3, neck circumference: 18  Cardiac: Regular rate rhythm, no murmur  Musculoskeletal: No deformity   Neurological examination  Mentation: Alert oriented to time, place, history taking. Follows all commands speech and language fluent Cranial nerve II-XII: Fundoscopic exam not done.  Pupils were equal round reactive to light extraocular  movements were full, visual field were full on confrontational test. Facial sensation and strength were normal. hearing was intact to finger rubbing bilaterally. Uvula tongue midline. head turning and shoulder shrug and were normal and symmetric. Tongue protrusion into cheek strength was normal. Motor: The motor testing reveals 5 over 5 strength of all 4 extremities. Good symmetric motor tone is noted throughout.  Sensory: Sensory testing is intact to soft touch on all 4 extremities. No evidence of extinction is noted.  Coordination: Cerebellar testing reveals good finger-nose-finger and heel-to-shin bilaterally.  Gait and station: Gait is normal. Tandem gait is normal. Romberg is negative.  Reflexes: Deep tendon reflexes are symmetric and normal bilaterally.   ASSESSMENT AND PLAN 63 y.o. year old female  has a past medical history of Arthritis; GERD (gastroesophageal reflux disease); Hypertension; Restless leg syndrome; No sense of smell; Carpal tunnel syndrome of left wrist (06/2011); Aneurysm of internal carotid artery (1986); Seizures; OSA (obstructive sleep apnea); High cholesterol; Headache(784.0); Asthma; Knee pain; Shingles; Sleep apnea with use of continuous positive airway pressure (CPAP) (01/24/2013); and Restless legs syndrome (RLS) (04/28/2013). here with:  1) OSA patient is compliant with CPAP at 12 cm water, we discussed EPR for her, I would like for her to try 2 cm flex.  Urged patient to wear cpap during daytime naps too. 2) urinary urge and nocturia, improved.  3) obesity: exercise and diet discussed .  4 ) RLS on Mirapex.  5) Horizant for neuropathy.  Plan: RV in 1 year with Dr. Vickey Hugerohmeier.  Ronal FearLYNN E. LAM, MSN, NP-C 10/25/2013, 11:11 AM [facility] [address], [address] [place], [place] [address] [phone]  Note: This document was prepared with digital dictation and possible smart phrase technology. Any transcriptional errors that result from this process are  unintentional.

## 2013-10-25 NOTE — Patient Instructions (Addendum)
Keep up the good work! Try to wear your cpap even when you lie down for a nap.  Please continue using your CPAP regularly. While your insurance requires that you use CPAP at least 4 hours each night on 70% of the nights, I recommend, that you not skip any nights and use it throughout the night if you can. Getting used to CPAP and staying with the treatment long term does take time and patience and discipline. Untreated obstructive sleep apnea when it is moderate to severe can have an adverse impact on cardiovascular health and raise her risk for heart disease, arrhythmias, hypertension, congestive heart failure, stroke and diabetes. Untreated obstructive sleep apnea causes sleep disruption, nonrestorative sleep, and sleep deprivation. This can have an impact on your day to day functioning and cause daytime sleepiness and impairment of cognitive function, memory loss, mood disturbance, and problems focussing. Using CPAP regularly can improve these symptoms.  Follow up in 1 year with Dr. Vickey Hugerohmeier, sooner as needed.

## 2013-12-28 ENCOUNTER — Other Ambulatory Visit: Payer: Self-pay | Admitting: Neurology

## 2013-12-28 NOTE — Telephone Encounter (Signed)
Dr Dohmeier prescribed this at [facility] on 09/24

## 2014-03-13 ENCOUNTER — Other Ambulatory Visit: Payer: Self-pay | Admitting: Neurology

## 2014-03-13 NOTE — Telephone Encounter (Signed)
RX signed and faxed.

## 2014-04-20 ENCOUNTER — Other Ambulatory Visit: Payer: Self-pay | Admitting: Neurology

## 2014-05-15 ENCOUNTER — Other Ambulatory Visit: Payer: Self-pay

## 2014-05-15 MED ORDER — GABAPENTIN ENACARBIL ER 600 MG PO TBCR: 900.0000 mg | EXTENDED_RELEASE_TABLET | Freq: Every day | ORAL | Status: DC

## 2014-05-17 ENCOUNTER — Other Ambulatory Visit: Payer: Self-pay

## 2014-05-17 MED ORDER — GABAPENTIN ENACARBIL ER 600 MG PO TBCR: 900.0000 mg | EXTENDED_RELEASE_TABLET | Freq: Every day | ORAL | Status: DC

## 2014-05-17 NOTE — Telephone Encounter (Signed)
Previous transmission failed

## 2014-09-10 ENCOUNTER — Other Ambulatory Visit: Payer: Self-pay

## 2014-09-10 MED ORDER — PHENOBARBITAL 64.8 MG PO TABS: ORAL_TABLET | ORAL | Status: DC

## 2014-09-10 MED ORDER — POTASSIUM CHLORIDE ER 10 MEQ PO TBCR: 10.0000 meq | EXTENDED_RELEASE_TABLET | Freq: Two times a day (BID) | ORAL | Status: DC

## 2014-09-11 NOTE — Telephone Encounter (Signed)
Rx has been faxed.

## 2014-10-05 ENCOUNTER — Telehealth: Payer: Self-pay | Admitting: Neurology

## 2014-10-05 MED ORDER — PRAMIPEXOLE DIHYDROCHLORIDE 0.25 MG PO TABS: 0.2500 mg | ORAL_TABLET | Freq: Two times a day (BID) | ORAL | Status: DC

## 2014-10-05 NOTE — Telephone Encounter (Signed)
Patient called requesting a smaller quantity for pramipexole (MIRAPEX) 0.25 MG tablet. to hold her until the refill comes from [facility]. The local pharmacy is [facility] [address] # [phone]. Please call and advise. Patient can be reached at [phone].

## 2014-10-05 NOTE — Telephone Encounter (Signed)
Rx has been sent.  I called back to advise.  Got no answer on number provided, and voicemail had not been set up, unable to leave message.  Called cell, got no answer.  Left message.

## 2014-10-17 ENCOUNTER — Other Ambulatory Visit: Payer: Self-pay | Admitting: Neurology

## 2014-10-25 ENCOUNTER — Encounter: Payer: Self-pay | Admitting: Neurology

## 2014-10-25 ENCOUNTER — Ambulatory Visit (INDEPENDENT_AMBULATORY_CARE_PROVIDER_SITE_OTHER): Payer: BC Managed Care – PPO | Admitting: Neurology

## 2014-10-25 VITALS — BP 128/78 | HR 86 | Resp 20 | Ht 61.42 in | Wt 264.0 lb

## 2014-10-25 DIAGNOSIS — G4733 Obstructive sleep apnea (adult) (pediatric): Secondary | ICD-10-CM | POA: Diagnosis not present

## 2014-10-25 DIAGNOSIS — Z9989 Dependence on other enabling machines and devices: Principal | ICD-10-CM

## 2014-10-25 NOTE — Progress Notes (Signed)
PATIENT: [name] DOB: [birth year]  REASON FOR VISIT: routine follow up for CPAP, RLS HISTORY FROM: patient  HISTORY OF PRESENT ILLNESS:  [name] is a 64 y.o. female Is seen here as a referral/ revisit from Dr. Paulino Rily for CPAP compliance.   Interval history:  10-25-14 [name] is here today for her yearly revisit, the great pleasure to see her in good health she endorsed the Epworth sleepiness score at 10 points and she brought me a download of her CPAP machine. This one is dated 10-24-14 and is a 30 day encompassing report she has a 97% compliance for 29 out of 30 days of use over 4 hours the average daily use is 4 hours 11 minutes. Her residual AHI is 1.5 on the current set at 12 cm setting. She does have a C-Flex setting of 2 cm water. She needs a new nasal liner.    Last visit : Follow up sleep disorder she endorsed today the Epworth Sleepiness Scale at 8 points, on  10-2014,  which was 11 previously and  the fatigue severity scale of 28 today which was 33 points previously and the depression score at one point, unchanged.  The durable medical equipment company is [facility] followed by [employer].  Her AHI on her sleep study was 73 and her AHI noted on the download was 1.9 per hour, over 30 days the compliance range was 96.7% at an average daily therapy time on CPAP at 12 cm water-  Was 4 hours and 26 minutes. The patient is compliant by CMS criteria, downloaded 10/25/13. She states she doing well with her cpap and now her husband is on cpap too.  RLS and neuropathy is well controlled .  OSA patient is compliant with CPAP at 12 cm water, we discussed EPR for her, I would like for her to try 2 cm flex.  Urged patient to wear cpap during daytime naps too.  Prior HPI: Dr. Sandria Manly had referred [name] for a sleep study on 11-19-2011. I had seen her for a sleep consult on 12-07-11 .  He felt that he was likely suffering from sleep apnea based on her excessive daytime sleepiness  and body mass index. The patient has a history of obesity, hypertension, cerebral aneurysm bleed, asthma, reflux disease a history of seizures and osteoarthritis. More sleep related for her complains of snoring, restless legs, witnessed apnea, disruptive snoring daytime napping .  Sleep study took place 11-19-11 :  She had endorsed the Epworth sleepiness score at 16 points and the Beck'sat 10 points , her BMI at the time of the study was 43.2.  The patient tested positive for severe, obstructive sleep apnea. Her AHI was 73 and her RDI was 74.8/hr. The REM AHI was only 57 , the supine AHI was 73 /hr. and the lowest oxygen saturation at nadir was 65%.The patient was titrated to 12 cm water pressure and slept 103 minutes but the setting, of which 45 minutes were REM sleep.  The AHI was now reduced to 1.2. She was referred to [facility] and has followed regularly with PSG and R , Boykin Peek.  The last download for this patient showed an 85% compliance over 180 days the residual AHI of 2.6 and therapy time per night at 5 hours and 26 minutes. The setting is still at 12 cm water.  The sleepiness score is 8 points much reduced in comparison to previous study time.  The patient has reported that  she noticed a big difference in her level of alertness and in the quality of her sleep. She also endorsed a geriatric depression scale today at one point.  Her fatigue severity score is 20 points, with normal limits.  The patient reports that she is going to bed between 10 and 11 PM she arises in the morning normally around 7 and 8 AM.  She gets about 8 hours of sleep and feels restored in the morning. She does not nap in daytime only rarely of weekends .She no longer snores on CPAP , goes to the bathroom more than once at night, no witnessed apnea, and no morning headaches. No longer has the severe incapacitating headaches she used to have . She feels stiff in the morning, but less than before she went through PT for  her arthritis and scoliosis. Some nights she recalls her dreams. These ar not nightmarish or even vivid. No sleep walking or sleep talking.  She measured today at 248 pounds and has for the gained weight since her last visit her BMI has now reached 45. She does not feel that her appetite has increased but that her metabolism has slowed.  She normally eats 2 meals a day, she has eliminated sodas and soft ramus and father brings water during daytime.  The patient stated she has the stamina to exercise, but she has noted that hot flashes reminiscent of menopausal symptoms during exercises, which make exercise uncomfortable.  Her past history under Dr Imagene Gurney care is noted in detail on 12-07-11 visit , sleep consult .  Right frontal craniotomy 1986 Dr. Jeral Fruit for multiple aneurysmata, right MCA trifurcation clipping. Left frontal craniotomy July 86, left distal ICA clipping, 5 days after surgery she suffered a seizure. Another 3 years later when trying to wean off phenobarbital, had a recurrent 15 by 25 mm aneurysm in the right ICA. 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   REVIEW OF SYSTEMS: Full 14 system review of systems performed and notable only for:  Epworth score of 11 points, some restless legs, some diarrhea, some back pain, bladder incontinence, urinary urgency, headaches , no tobacco, ETOH or recreational drug use.  Dry mouth after Detrol. No nocturia , but hourly urination in daytime.    ALLERGIES: Allergies  Allergen Reactions  . Compazine Shortness Of Breath  . Iohexol Hives and Shortness Of Breath       . Prochlorperazine Maleate   . Topamax [Topiramate]   . Codeine Sulfate Nausea Only  . Adhesive [Tape] Rash  . Iodinated Diagnostic Agents Rash    HOME MEDICATIONS: Outpatient Prescriptions Prior to Visit  Medication Sig Dispense Refill  . albuterol (PROVENTIL HFA;VENTOLIN HFA) 108 (90 BASE) MCG/ACT inhaler Inhale 2 puffs into the lungs as needed.    [name]  amLODipine-benazepril (LOTREL) 5-20 MG per capsule     . aspirin 81 MG tablet Take 81 mg by mouth daily. PM    . Calcium Carbonate-Vitamin D (CALCIUM 600 + D PO) Take by mouth. 1 tablet in am , 1 tablet in pm    . cetirizine (ZYRTEC) 10 MG tablet Take 10 mg by mouth daily. AM    . doxycycline (ORACEA) 40 MG capsule Take 40 mg by mouth every morning.    . fluticasone (FLONASE) 50 MCG/ACT nasal spray Place 2 sprays into the nose 2 (two) times daily.    . furosemide (LASIX) 20 MG tablet Take 20 mg by mouth daily.    . Gabapentin Enacarbil (HORIZANT) 600 MG TBCR  Take 900 mg by mouth daily. 135 tablet 1  . IRON, FERROUS GLUCONATE, PO Take 45 mg by mouth. Slow release, 1 am    . levocetirizine (XYZAL) 5 MG tablet     . meloxicam (MOBIC) 15 MG tablet     . montelukast (SINGULAIR) 10 MG tablet Take 10 mg by mouth at bedtime.    . Multiple Vitamin (MULTIVITAMIN) capsule Take 1 capsule by mouth daily.    . pantoprazole (PROTONIX) 20 MG tablet Take 20 mg by mouth daily. AM    . PHENobarbital (LUMINAL) 64.8 MG tablet TAKE 2 TABLETS BY MOUTH EVERY DAY AT BEDTIME 180 tablet 0  . potassium chloride (KLOR-CON 10) 10 MEQ tablet Take 1 tablet (10 mEq total) by mouth 2 (two) times daily. 180 tablet 0  . pramipexole (MIRAPEX) 0.25 MG tablet TAKE 1 TABLET TWICE A DAY 30 tablet 0  . pramipexole (MIRAPEX) 0.75 MG tablet Take 1 tablet (0.75 mg total) by mouth daily. Evening dosage 90 tablet 3  . rosuvastatin (CRESTOR) 20 MG tablet Take 20 mg by mouth daily.    . traMADol (ULTRAM) 50 MG tablet Take 50 mg by mouth every 6 (six) hours as needed.    . venlafaxine XR (EFFEXOR-XR) 75 MG 24 hr capsule TAKE ONE CAPSULE BY MOUTH once A DAY    . VITAMIN D, CHOLECALCIFEROL, PO Take 1,000 Units by mouth. 3 in am    . triamterene-hydrochlorothiazide (MAXZIDE) 75-50 MG per tablet Take 1 tablet by mouth daily. AM 90 tablet 3   No facility-administered medications prior to visit.     PHYSICAL EXAM  Filed Vitals:   10/25/14  1344  BP: 128/78  Pulse: 86  Resp: 20  Height: 5' 1.42" (1.56 m)  Weight: 264 lb (119.75 kg)   Body mass index is 49.21 kg/(m^2). No exam data present   Generalized: Well developed, in no acute distress, obese Caucasian female Head: Normocephalic, atraumatic.  Neck is supple. Mallampati 3, neck circumference: 18  Cardiac: Regular rate rhythm, no murmur  Musculoskeletal: No deformity   Neurological examination  Mentation: Alert oriented to time, place, history taking. Follows all commands speech and language fluent Cranial nerve II-XII: Fundoscopic exam not done.  Pupils were equal round reactive to light extraocular movements were full, visual field were full on confrontational test. Facial sensation and strength were normal. hearing was intact to finger rubbing bilaterally. Uvula tongue midline. head turning and shoulder shrug and were normal and symmetric. Tongue protrusion into cheek strength was normal. Motor: The motor testing reveals 5 over 5 strength of all 4 extremities. Good symmetric motor tone is noted throughout.  Sensory: Sensory testing is intact to soft touch on all 4 extremities. No evidence of extinction is noted.  Coordination: Cerebellar testing reveals good finger-nose-finger and heel-to-shin bilaterally.  Gait and station: Gait is normal. Tandem gait is normal. Romberg is negative.  Reflexes: Deep tendon reflexes are symmetric and normal bilaterally.   ASSESSMENT AND PLAN 64 y.o. year old female  has a past medical history of Arthritis; GERD (gastroesophageal reflux disease); Hypertension; Restless leg syndrome; No sense of smell; Carpal tunnel syndrome of left wrist (06/2011); Aneurysm of internal carotid artery (1986); Seizures; OSA (obstructive sleep apnea); High cholesterol; Headache(784.0); Asthma; Knee pain; Shingles; Sleep apnea with use of continuous positive airway pressure (CPAP) (01/24/2013); and Restless legs syndrome (RLS) (04/28/2013). here with:  1) OSA  well controlled.  2) urinary urge and nocturia, improved.  3) obesity: exercise and diet discussed .  4 )  RLS on Mirapex.  5) Horizant for neuropathy.   Plan: RV in 1 year with me, Dr. Vickey Huger. Needs new supplies . Used to be followed  by [facility].   10/25/2014, 2:07 PM [facility] [address], [address] [place], [place] [address] [phone]  Note: This document was prepared with digital dictation and possible smart phrase technology. Any transcriptional errors that result from this process are unintentional.

## 2014-10-26 ENCOUNTER — Ambulatory Visit: Payer: BC Managed Care – PPO | Admitting: Neurology

## 2014-12-21 ENCOUNTER — Other Ambulatory Visit: Payer: Self-pay | Admitting: Neurology

## 2014-12-21 NOTE — Telephone Encounter (Signed)
Prescribed at [facility] on 09/16

## 2015-01-09 ENCOUNTER — Other Ambulatory Visit: Payer: Self-pay

## 2015-01-09 MED ORDER — PHENOBARBITAL 64.8 MG PO TABS: ORAL_TABLET | ORAL | Status: DC

## 2015-01-09 MED ORDER — PRAMIPEXOLE DIHYDROCHLORIDE 0.25 MG PO TABS: 0.2500 mg | ORAL_TABLET | Freq: Two times a day (BID) | ORAL | Status: DC

## 2015-01-09 NOTE — Telephone Encounter (Signed)
Rx signed and faxed.

## 2015-03-30 ENCOUNTER — Other Ambulatory Visit: Payer: Self-pay | Admitting: Neurology

## 2015-06-02 ENCOUNTER — Other Ambulatory Visit: Payer: Self-pay | Admitting: Neurology

## 2015-06-12 DIAGNOSIS — R55 Syncope and collapse: Secondary | ICD-10-CM

## 2015-06-12 HISTORY — DX: Syncope and collapse: R55

## 2015-06-18 ENCOUNTER — Other Ambulatory Visit: Payer: Self-pay | Admitting: Orthopedic Surgery

## 2015-06-18 DIAGNOSIS — G8929 Other chronic pain: Secondary | ICD-10-CM

## 2015-06-18 DIAGNOSIS — M545 Low back pain: Principal | ICD-10-CM

## 2015-06-18 DIAGNOSIS — M4310 Spondylolisthesis, site unspecified: Secondary | ICD-10-CM

## 2015-06-25 ENCOUNTER — Ambulatory Visit
Admission: RE | Admit: 2015-06-25 | Discharge: 2015-06-25 | Disposition: A | Discharge status: Home / Self Care | Payer: BC Managed Care – PPO | Source: Ambulatory Visit | Attending: Orthopedic Surgery | Admitting: Orthopedic Surgery

## 2015-06-25 VITALS — BP 146/75 | HR 68

## 2015-06-25 DIAGNOSIS — M545 Low back pain, unspecified: Secondary | ICD-10-CM

## 2015-06-25 DIAGNOSIS — G2581 Restless legs syndrome: Secondary | ICD-10-CM

## 2015-06-25 DIAGNOSIS — G8929 Other chronic pain: Secondary | ICD-10-CM

## 2015-06-25 DIAGNOSIS — M4310 Spondylolisthesis, site unspecified: Secondary | ICD-10-CM

## 2015-06-25 MED ORDER — IOHEXOL 180 MG/ML  SOLN
15.0000 mL | Freq: Once | INTRAMUSCULAR | Status: AC | PRN
  Administered 2015-06-25: 15 mL via INTRATHECAL

## 2015-06-25 MED ORDER — DIAZEPAM 5 MG PO TABS
10.0000 mg | ORAL_TABLET | Freq: Once | ORAL | Status: AC
  Administered 2015-06-25: 10 mg via ORAL

## 2015-06-25 MED ORDER — DIPHENHYDRAMINE HCL 50 MG PO CAPS
50.0000 mg | ORAL_CAPSULE | Freq: Once | ORAL | Status: AC
  Administered 2015-06-25: 50 mg via ORAL

## 2015-06-25 NOTE — Discharge Instructions (Signed)

## 2015-07-02 ENCOUNTER — Emergency Department (HOSPITAL_COMMUNITY): Payer: BC Managed Care – PPO

## 2015-07-02 ENCOUNTER — Encounter (HOSPITAL_COMMUNITY): Payer: Self-pay | Admitting: Emergency Medicine

## 2015-07-02 ENCOUNTER — Inpatient Hospital Stay (HOSPITAL_COMMUNITY)
Admission: EM | Admit: 2015-07-02 | Discharge: 2015-07-06 | DRG: 493 | Disposition: A | Discharge status: Home Health | Payer: BC Managed Care – PPO | Attending: Internal Medicine | Admitting: Internal Medicine

## 2015-07-02 DIAGNOSIS — R600 Localized edema: Secondary | ICD-10-CM | POA: Diagnosis not present

## 2015-07-02 DIAGNOSIS — G2581 Restless legs syndrome: Secondary | ICD-10-CM | POA: Diagnosis present

## 2015-07-02 DIAGNOSIS — Z23 Encounter for immunization: Secondary | ICD-10-CM | POA: Diagnosis not present

## 2015-07-02 DIAGNOSIS — Z6841 Body Mass Index (BMI) 40.0 and over, adult: Secondary | ICD-10-CM | POA: Diagnosis not present

## 2015-07-02 DIAGNOSIS — R569 Unspecified convulsions: Secondary | ICD-10-CM | POA: Diagnosis not present

## 2015-07-02 DIAGNOSIS — R430 Anosmia: Secondary | ICD-10-CM | POA: Diagnosis present

## 2015-07-02 DIAGNOSIS — S82851A Displaced trimalleolar fracture of right lower leg, initial encounter for closed fracture: Principal | ICD-10-CM

## 2015-07-02 DIAGNOSIS — D72829 Elevated white blood cell count, unspecified: Secondary | ICD-10-CM | POA: Diagnosis present

## 2015-07-02 DIAGNOSIS — Z7982 Long term (current) use of aspirin: Secondary | ICD-10-CM | POA: Diagnosis not present

## 2015-07-02 DIAGNOSIS — Y92410 Unspecified street and highway as the place of occurrence of the external cause: Secondary | ICD-10-CM | POA: Diagnosis not present

## 2015-07-02 DIAGNOSIS — G4733 Obstructive sleep apnea (adult) (pediatric): Secondary | ICD-10-CM | POA: Diagnosis present

## 2015-07-02 DIAGNOSIS — E7800 Pure hypercholesterolemia, unspecified: Secondary | ICD-10-CM | POA: Diagnosis present

## 2015-07-02 DIAGNOSIS — R4020 Unspecified coma: Secondary | ICD-10-CM

## 2015-07-02 DIAGNOSIS — Z87891 Personal history of nicotine dependence: Secondary | ICD-10-CM

## 2015-07-02 DIAGNOSIS — S82891A Other fracture of right lower leg, initial encounter for closed fracture: Secondary | ICD-10-CM | POA: Diagnosis not present

## 2015-07-02 DIAGNOSIS — K219 Gastro-esophageal reflux disease without esophagitis: Secondary | ICD-10-CM | POA: Diagnosis present

## 2015-07-02 DIAGNOSIS — G40909 Epilepsy, unspecified, not intractable, without status epilepticus: Secondary | ICD-10-CM

## 2015-07-02 DIAGNOSIS — I10 Essential (primary) hypertension: Secondary | ICD-10-CM | POA: Diagnosis present

## 2015-07-02 DIAGNOSIS — R55 Syncope and collapse: Secondary | ICD-10-CM | POA: Diagnosis present

## 2015-07-02 DIAGNOSIS — Z79899 Other long term (current) drug therapy: Secondary | ICD-10-CM | POA: Diagnosis not present

## 2015-07-02 DIAGNOSIS — S82853A Displaced trimalleolar fracture of unspecified lower leg, initial encounter for closed fracture: Secondary | ICD-10-CM | POA: Diagnosis present

## 2015-07-02 DIAGNOSIS — IMO0001 Reserved for inherently not codable concepts without codable children: Secondary | ICD-10-CM

## 2015-07-02 DIAGNOSIS — J45909 Unspecified asthma, uncomplicated: Secondary | ICD-10-CM | POA: Diagnosis present

## 2015-07-02 DIAGNOSIS — N390 Urinary tract infection, site not specified: Secondary | ICD-10-CM | POA: Diagnosis present

## 2015-07-02 DIAGNOSIS — Q7291 Unspecified reduction defect of right lower limb: Secondary | ICD-10-CM

## 2015-07-02 DIAGNOSIS — T148 Other injury of unspecified body region: Secondary | ICD-10-CM | POA: Diagnosis present

## 2015-07-02 DIAGNOSIS — G4730 Sleep apnea, unspecified: Secondary | ICD-10-CM | POA: Diagnosis present

## 2015-07-02 DIAGNOSIS — E785 Hyperlipidemia, unspecified: Secondary | ICD-10-CM | POA: Diagnosis present

## 2015-07-02 DIAGNOSIS — E782 Mixed hyperlipidemia: Secondary | ICD-10-CM | POA: Diagnosis present

## 2015-07-02 LAB — CBC
HCT: 37.9 % (ref 36.0–46.0)
HEMOGLOBIN: 12.5 g/dL (ref 12.0–15.0)
MCH: 30.2 pg (ref 26.0–34.0)
MCHC: 33 g/dL (ref 30.0–36.0)
MCV: 91.5 fL (ref 78.0–100.0)
PLATELETS: 186 10*3/uL (ref 150–400)
RBC: 4.14 MIL/uL (ref 3.87–5.11)
RDW: 13.3 % (ref 11.5–15.5)
WBC: 13.2 10*3/uL — ABNORMAL HIGH (ref 4.0–10.5)

## 2015-07-02 LAB — BASIC METABOLIC PANEL
ANION GAP: 9 (ref 5–15)
BUN: 17 mg/dL (ref 6–20)
CO2: 25 mmol/L (ref 22–32)
Calcium: 9.5 mg/dL (ref 8.9–10.3)
Chloride: 105 mmol/L (ref 101–111)
Creatinine, Ser: 0.85 mg/dL (ref 0.44–1.00)
Glucose, Bld: 137 mg/dL — ABNORMAL HIGH (ref 65–99)
POTASSIUM: 4 mmol/L (ref 3.5–5.1)
SODIUM: 139 mmol/L (ref 135–145)

## 2015-07-02 LAB — I-STAT CHEM 8, ED
BUN: 20 mg/dL (ref 6–20)
Calcium, Ion: 1.18 mmol/L (ref 1.13–1.30)
Chloride: 101 mmol/L (ref 101–111)
Creatinine, Ser: 0.7 mg/dL (ref 0.44–1.00)
Glucose, Bld: 116 mg/dL — ABNORMAL HIGH (ref 65–99)
HEMATOCRIT: 36 % (ref 36.0–46.0)
HEMOGLOBIN: 12.2 g/dL (ref 12.0–15.0)
POTASSIUM: 4.1 mmol/L (ref 3.5–5.1)
SODIUM: 138 mmol/L (ref 135–145)
TCO2: 24 mmol/L (ref 0–100)

## 2015-07-02 LAB — PHENOBARBITAL LEVEL: Phenobarbital: 18.5 ug/mL (ref 15.0–40.0)

## 2015-07-02 LAB — MAGNESIUM: MAGNESIUM: 1.6 mg/dL — AB (ref 1.7–2.4)

## 2015-07-02 LAB — PHOSPHORUS: PHOSPHORUS: 3.2 mg/dL (ref 2.5–4.6)

## 2015-07-02 LAB — TSH: TSH: 0.95 u[IU]/mL (ref 0.350–4.500)

## 2015-07-02 LAB — CALCIUM: Calcium: 9.5 mg/dL (ref 8.9–10.3)

## 2015-07-02 MED ORDER — LORATADINE 10 MG PO TABS
10.0000 mg | ORAL_TABLET | Freq: Every evening | ORAL | Status: DC
  Administered 2015-07-02 – 2015-07-04 (×2): 10 mg via ORAL
  Filled 2015-07-02 (×4): qty 1

## 2015-07-02 MED ORDER — HEPARIN SODIUM (PORCINE) 5000 UNIT/ML IJ SOLN
5000.0000 [IU] | Freq: Three times a day (TID) | INTRAMUSCULAR | Status: DC
  Administered 2015-07-02 – 2015-07-06 (×11): 5000 [IU] via SUBCUTANEOUS
  Filled 2015-07-02 (×11): qty 1

## 2015-07-02 MED ORDER — SPIRONOLACTONE 25 MG PO TABS: 25.0000 mg | ORAL_TABLET | Freq: Every day | ORAL | Status: DC

## 2015-07-02 MED ORDER — POLYETHYLENE GLYCOL 3350 17 G PO PACK
17.0000 g | PACK | Freq: Every day | ORAL | Status: DC | PRN
  Filled 2015-07-02: qty 1

## 2015-07-02 MED ORDER — MAGNESIUM CITRATE PO SOLN: 1.0000 | Freq: Once | ORAL | Status: DC | PRN

## 2015-07-02 MED ORDER — ONDANSETRON HCL 4 MG PO TABS: 4.0000 mg | ORAL_TABLET | Freq: Four times a day (QID) | ORAL | Status: DC | PRN

## 2015-07-02 MED ORDER — MORPHINE SULFATE (PF) 2 MG/ML IV SOLN
2.0000 mg | Freq: Once | INTRAVENOUS | Status: AC
  Administered 2015-07-02: 2 mg via INTRAVENOUS
  Filled 2015-07-02: qty 1

## 2015-07-02 MED ORDER — GABAPENTIN ENACARBIL ER 600 MG PO TBCR: 900.0000 mg | EXTENDED_RELEASE_TABLET | Freq: Every day | ORAL | Status: DC

## 2015-07-02 MED ORDER — MORPHINE SULFATE (PF) 2 MG/ML IV SOLN
1.0000 mg | INTRAVENOUS | Status: DC | PRN
  Administered 2015-07-03 (×2): 1 mg via INTRAVENOUS
  Filled 2015-07-02 (×2): qty 1

## 2015-07-02 MED ORDER — AMLODIPINE BESY-BENAZEPRIL HCL 5-20 MG PO CAPS: 1.0000 | ORAL_CAPSULE | Freq: Every day | ORAL | Status: DC

## 2015-07-02 MED ORDER — ALUM & MAG HYDROXIDE-SIMETH 200-200-20 MG/5ML PO SUSP
30.0000 mL | Freq: Four times a day (QID) | ORAL | Status: DC | PRN
  Administered 2015-07-03 – 2015-07-04 (×2): 30 mL via ORAL
  Filled 2015-07-02 (×2): qty 30

## 2015-07-02 MED ORDER — SODIUM CHLORIDE 0.9% FLUSH
3.0000 mL | Freq: Two times a day (BID) | INTRAVENOUS | Status: DC
  Administered 2015-07-02 – 2015-07-05 (×5): 3 mL via INTRAVENOUS

## 2015-07-02 MED ORDER — VENLAFAXINE HCL ER 75 MG PO CP24
75.0000 mg | ORAL_CAPSULE | Freq: Every day | ORAL | Status: DC
  Administered 2015-07-03 – 2015-07-06 (×4): 75 mg via ORAL
  Filled 2015-07-02 (×4): qty 1

## 2015-07-02 MED ORDER — ROSUVASTATIN CALCIUM 20 MG PO TABS
20.0000 mg | ORAL_TABLET | Freq: Every day | ORAL | Status: DC
  Administered 2015-07-02 – 2015-07-06 (×5): 20 mg via ORAL
  Filled 2015-07-02 (×5): qty 1

## 2015-07-02 MED ORDER — ASPIRIN EC 81 MG PO TBEC
81.0000 mg | DELAYED_RELEASE_TABLET | Freq: Every day | ORAL | Status: DC
  Administered 2015-07-03: 81 mg via ORAL
  Filled 2015-07-02 (×3): qty 1

## 2015-07-02 MED ORDER — ONDANSETRON HCL 4 MG/2ML IJ SOLN: 4.0000 mg | Freq: Four times a day (QID) | INTRAMUSCULAR | Status: DC | PRN

## 2015-07-02 MED ORDER — MONTELUKAST SODIUM 10 MG PO TABS
10.0000 mg | ORAL_TABLET | Freq: Every day | ORAL | Status: DC
  Administered 2015-07-02 – 2015-07-05 (×4): 10 mg via ORAL
  Filled 2015-07-02 (×5): qty 1

## 2015-07-02 MED ORDER — AMLODIPINE BESYLATE 10 MG PO TABS
20.0000 mg | ORAL_TABLET | Freq: Every day | ORAL | Status: DC
  Administered 2015-07-02: 20 mg via ORAL
  Filled 2015-07-02 (×2): qty 2

## 2015-07-02 MED ORDER — BISACODYL 10 MG RE SUPP: 10.0000 mg | Freq: Every day | RECTAL | Status: DC | PRN

## 2015-07-02 MED ORDER — POTASSIUM CHLORIDE CRYS ER 10 MEQ PO TBCR
10.0000 meq | EXTENDED_RELEASE_TABLET | Freq: Two times a day (BID) | ORAL | Status: DC
  Administered 2015-07-02 – 2015-07-06 (×8): 10 meq via ORAL
  Filled 2015-07-02 (×8): qty 1

## 2015-07-02 MED ORDER — HYDROMORPHONE HCL 1 MG/ML IJ SOLN: 0.5000 mg | INTRAMUSCULAR | Status: AC | PRN

## 2015-07-02 MED ORDER — MELOXICAM 7.5 MG PO TABS
15.0000 mg | ORAL_TABLET | Freq: Every day | ORAL | Status: DC
  Administered 2015-07-03: 15 mg via ORAL
  Filled 2015-07-02: qty 2

## 2015-07-02 MED ORDER — PRAMIPEXOLE DIHYDROCHLORIDE 0.25 MG PO TABS
0.7500 mg | ORAL_TABLET | Freq: Every day | ORAL | Status: DC
  Administered 2015-07-02 – 2015-07-05 (×3): 0.75 mg via ORAL
  Filled 2015-07-02 (×4): qty 3

## 2015-07-02 MED ORDER — ONDANSETRON HCL 4 MG/2ML IJ SOLN: 4.0000 mg | Freq: Three times a day (TID) | INTRAMUSCULAR | Status: DC | PRN

## 2015-07-02 MED ORDER — PRAMIPEXOLE DIHYDROCHLORIDE 0.25 MG PO TABS: 0.7500 mg | ORAL_TABLET | Freq: Every day | ORAL | Status: DC

## 2015-07-02 MED ORDER — LEVOCETIRIZINE DIHYDROCHLORIDE 5 MG PO TABS
5.0000 mg | ORAL_TABLET | Freq: Every evening | ORAL | Status: DC
  Filled 2015-07-02: qty 1

## 2015-07-02 MED ORDER — FLUTICASONE PROPIONATE 50 MCG/ACT NA SUSP
2.0000 | Freq: Every day | NASAL | Status: DC
  Administered 2015-07-04 – 2015-07-05 (×2): 2 via NASAL
  Filled 2015-07-02 (×2): qty 16

## 2015-07-02 MED ORDER — PRAMIPEXOLE DIHYDROCHLORIDE 0.25 MG PO TABS
0.5000 mg | ORAL_TABLET | Freq: Every day | ORAL | Status: DC
  Administered 2015-07-03 – 2015-07-06 (×4): 0.5 mg via ORAL
  Filled 2015-07-02 (×4): qty 2

## 2015-07-02 MED ORDER — PHENOBARBITAL 97.2 MG PO TABS
129.6000 mg | ORAL_TABLET | Freq: Every day | ORAL | Status: DC
  Administered 2015-07-02 – 2015-07-05 (×4): 129.6 mg via ORAL
  Filled 2015-07-02 (×4): qty 1

## 2015-07-02 MED ORDER — ALBUTEROL SULFATE (2.5 MG/3ML) 0.083% IN NEBU: 2.5000 mg | INHALATION_SOLUTION | Freq: Four times a day (QID) | RESPIRATORY_TRACT | Status: AC | PRN

## 2015-07-02 MED ORDER — KETAMINE HCL 10 MG/ML IJ SOLN
1.0000 mg/kg | Freq: Once | INTRAMUSCULAR | Status: AC
  Administered 2015-07-02: 100 mg via INTRAVENOUS
  Filled 2015-07-02: qty 11.3

## 2015-07-02 MED ORDER — HYDRALAZINE HCL 20 MG/ML IJ SOLN: 10.0000 mg | Freq: Four times a day (QID) | INTRAMUSCULAR | Status: DC | PRN

## 2015-07-02 MED ORDER — ACETAMINOPHEN 650 MG RE SUPP: 650.0000 mg | Freq: Four times a day (QID) | RECTAL | Status: DC | PRN

## 2015-07-02 MED ORDER — ALBUTEROL SULFATE HFA 108 (90 BASE) MCG/ACT IN AERS: 2.0000 | INHALATION_SPRAY | RESPIRATORY_TRACT | Status: DC | PRN

## 2015-07-02 MED ORDER — DOCUSATE SODIUM 100 MG PO CAPS
100.0000 mg | ORAL_CAPSULE | Freq: Two times a day (BID) | ORAL | Status: DC
  Administered 2015-07-02 – 2015-07-06 (×8): 100 mg via ORAL
  Filled 2015-07-02 (×8): qty 1

## 2015-07-02 MED ORDER — FUROSEMIDE 20 MG PO TABS
20.0000 mg | ORAL_TABLET | Freq: Every day | ORAL | Status: DC
  Administered 2015-07-02 – 2015-07-06 (×5): 20 mg via ORAL
  Filled 2015-07-02 (×5): qty 1

## 2015-07-02 MED ORDER — PANTOPRAZOLE SODIUM 20 MG PO TBEC
20.0000 mg | DELAYED_RELEASE_TABLET | Freq: Every day | ORAL | Status: DC
  Administered 2015-07-02 – 2015-07-06 (×5): 20 mg via ORAL
  Filled 2015-07-02 (×5): qty 1

## 2015-07-02 MED ORDER — ACETAMINOPHEN 325 MG PO TABS
650.0000 mg | ORAL_TABLET | Freq: Four times a day (QID) | ORAL | Status: DC | PRN
  Administered 2015-07-04 – 2015-07-05 (×5): 650 mg via ORAL
  Filled 2015-07-02 (×5): qty 2

## 2015-07-02 MED ORDER — OXYCODONE HCL 5 MG PO TABS
5.0000 mg | ORAL_TABLET | ORAL | Status: DC | PRN
  Administered 2015-07-02 – 2015-07-06 (×14): 10 mg via ORAL
  Filled 2015-07-02 (×14): qty 2

## 2015-07-02 NOTE — Progress Notes (Signed)
32Orthopedic Tech Progress Note Patient Details:  [name] 07-25-1950 [id number]  Ortho Devices Type of Ortho Device: Ace wrap, Post (short leg) splint, Stirrup splint Ortho Device/Splint Interventions: Application   Saul Fordyce 07/02/2015, 12:08 PM

## 2015-07-02 NOTE — ED Notes (Signed)
Notified Junious Silk NP that pts primary ortho doctor is Dr. Turner Daniels with [facility] orthopedics.

## 2015-07-02 NOTE — ED Notes (Signed)
Paged ortho 

## 2015-07-02 NOTE — ED Notes (Signed)
Dinner tray ordered for pt
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
## 2015-07-02 NOTE — Progress Notes (Addendum)
EDP inadvertently contacted orthopedic physician on-call for unassigned. Patient later clarified to that physician she has a primary orthopedist/Dr. Turner Daniels. Contacted service who directed me to [employer]. Updated PA on situation in patient status but PA does not handle consults on emergent patient's therefore she is in process of contacting her attending M.D. Spoke with on-call orthopedic physician Dr.Dumonsky who will see the patient in consultation tonight.  Junious Silk, ANP

## 2015-07-02 NOTE — Progress Notes (Signed)
I was contacted about this patient of Dr. Wadie Lessen regarding her right ankle fracture. Per notes, ankle was reduced with intact pulses noted after reduction, and a splint was applied by ortho tech. I have reviewed post-reduction x-rays and was eventually able to speak with ED doc caring for this patient. The ankle mortise is appropriately reduced and a trimal fracture is noted. Of note, there is no note from the ED doc commenting on the status of the patient's skin. However, good cap refill is noted. I did page the ortho tech who applied splint [name]). [name] was able to confirm for me that skin was intact throughout her ankle and foot. Ankle is appropriately reduced and will need operative intervention when soft tissue allows. Please keep foot elevated as much as possible as all times. Patient is to be admitted to [facility] for a syncopal workup. Dr. Turner Daniels will be evaluating patient today.
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
## 2015-07-02 NOTE — Sedation Documentation (Signed)
Right ankle reduced by Dr. Rosalia Hammers. Pulses intact after reduction splint applied to RLE by ortho technician.

## 2015-07-02 NOTE — ED Notes (Signed)
Attempted report x1. 

## 2015-07-03 ENCOUNTER — Inpatient Hospital Stay (HOSPITAL_COMMUNITY): Payer: BC Managed Care – PPO | Admitting: Anesthesiology

## 2015-07-03 ENCOUNTER — Ambulatory Visit (HOSPITAL_COMMUNITY): Payer: BC Managed Care – PPO

## 2015-07-03 ENCOUNTER — Inpatient Hospital Stay (HOSPITAL_COMMUNITY): Payer: BC Managed Care – PPO

## 2015-07-03 ENCOUNTER — Encounter (HOSPITAL_COMMUNITY)
Admission: EM | Disposition: A | Discharge status: Home Health | Payer: Self-pay | Source: Home / Self Care | Attending: Internal Medicine

## 2015-07-03 DIAGNOSIS — G2581 Restless legs syndrome: Secondary | ICD-10-CM

## 2015-07-03 DIAGNOSIS — R55 Syncope and collapse: Secondary | ICD-10-CM

## 2015-07-03 DIAGNOSIS — S82853A Displaced trimalleolar fracture of unspecified lower leg, initial encounter for closed fracture: Secondary | ICD-10-CM | POA: Diagnosis present

## 2015-07-03 DIAGNOSIS — S82891A Other fracture of right lower leg, initial encounter for closed fracture: Secondary | ICD-10-CM

## 2015-07-03 DIAGNOSIS — G4733 Obstructive sleep apnea (adult) (pediatric): Secondary | ICD-10-CM

## 2015-07-03 DIAGNOSIS — G40909 Epilepsy, unspecified, not intractable, without status epilepticus: Secondary | ICD-10-CM

## 2015-07-03 DIAGNOSIS — I10 Essential (primary) hypertension: Secondary | ICD-10-CM

## 2015-07-03 HISTORY — PX: TRANSTHORACIC ECHOCARDIOGRAM: SHX275

## 2015-07-03 HISTORY — PX: ORIF ANKLE FRACTURE: SHX5408

## 2015-07-03 LAB — HEMOGLOBIN A1C
HEMOGLOBIN A1C: 5.4 % (ref 4.8–5.6)
MEAN PLASMA GLUCOSE: 108 mg/dL

## 2015-07-03 LAB — URINALYSIS, ROUTINE W REFLEX MICROSCOPIC
Bilirubin Urine: NEGATIVE
Glucose, UA: NEGATIVE mg/dL
Hgb urine dipstick: NEGATIVE
Ketones, ur: NEGATIVE mg/dL
NITRITE: POSITIVE — AB
PH: 5.5 (ref 5.0–8.0)
Protein, ur: NEGATIVE mg/dL
SPECIFIC GRAVITY, URINE: 1.005 (ref 1.005–1.030)

## 2015-07-03 LAB — GLUCOSE, CAPILLARY: Glucose-Capillary: 116 mg/dL — ABNORMAL HIGH (ref 65–99)

## 2015-07-03 LAB — URINE MICROSCOPIC-ADD ON

## 2015-07-03 LAB — SURGICAL PCR SCREEN
MRSA, PCR: NEGATIVE
Staphylococcus aureus: POSITIVE — AB

## 2015-07-03 LAB — VITAMIN D 25 HYDROXY (VIT D DEFICIENCY, FRACTURES): Vit D, 25-Hydroxy: 24.7 ng/mL — ABNORMAL LOW (ref 30.0–100.0)

## 2015-07-03 SURGERY — OPEN REDUCTION INTERNAL FIXATION (ORIF) ANKLE FRACTURE
Anesthesia: Regional | Laterality: Right

## 2015-07-03 MED ORDER — METOCLOPRAMIDE HCL 5 MG PO TABS: 5.0000 mg | ORAL_TABLET | Freq: Three times a day (TID) | ORAL | Status: DC | PRN

## 2015-07-03 MED ORDER — HYDROMORPHONE HCL 1 MG/ML IJ SOLN
INTRAMUSCULAR | Status: AC
  Filled 2015-07-03: qty 1

## 2015-07-03 MED ORDER — FENTANYL CITRATE (PF) 250 MCG/5ML IJ SOLN
INTRAMUSCULAR | Status: AC
  Filled 2015-07-03: qty 5

## 2015-07-03 MED ORDER — OXYCODONE-ACETAMINOPHEN 5-325 MG PO TABS: 1.0000 | ORAL_TABLET | ORAL | Status: DC | PRN

## 2015-07-03 MED ORDER — LACTATED RINGERS IV SOLN: INTRAVENOUS | Status: DC

## 2015-07-03 MED ORDER — DEXTROSE 5 % IV SOLN
1.0000 g | Freq: Every day | INTRAVENOUS | Status: DC
  Administered 2015-07-03 – 2015-07-05 (×3): 1 g via INTRAVENOUS
  Filled 2015-07-03 (×4): qty 10

## 2015-07-03 MED ORDER — PROPOFOL 10 MG/ML IV BOLUS
INTRAVENOUS | Status: AC
  Filled 2015-07-03: qty 20

## 2015-07-03 MED ORDER — MIDAZOLAM HCL 2 MG/2ML IJ SOLN
INTRAMUSCULAR | Status: AC
  Filled 2015-07-03: qty 2

## 2015-07-03 MED ORDER — ROCURONIUM BROMIDE 100 MG/10ML IV SOLN
INTRAVENOUS | Status: DC | PRN
  Administered 2015-07-03 (×2): 10 mg via INTRAVENOUS
  Administered 2015-07-03: 50 mg via INTRAVENOUS

## 2015-07-03 MED ORDER — SUCCINYLCHOLINE CHLORIDE 20 MG/ML IJ SOLN
INTRAMUSCULAR | Status: DC | PRN
  Administered 2015-07-03: 120 mg via INTRAVENOUS

## 2015-07-03 MED ORDER — HYDROMORPHONE HCL 1 MG/ML IJ SOLN
0.2500 mg | INTRAMUSCULAR | Status: DC | PRN
  Administered 2015-07-03: 0.25 mg via INTRAVENOUS

## 2015-07-03 MED ORDER — MIDAZOLAM HCL 5 MG/5ML IJ SOLN
INTRAMUSCULAR | Status: DC | PRN
  Administered 2015-07-03: 1 mg via INTRAVENOUS

## 2015-07-03 MED ORDER — ONDANSETRON HCL 4 MG PO TABS: 4.0000 mg | ORAL_TABLET | Freq: Four times a day (QID) | ORAL | Status: DC | PRN

## 2015-07-03 MED ORDER — OXYCODONE HCL 5 MG/5ML PO SOLN: 5.0000 mg | Freq: Once | ORAL | Status: DC | PRN

## 2015-07-03 MED ORDER — ONDANSETRON HCL 4 MG/2ML IJ SOLN: 4.0000 mg | Freq: Four times a day (QID) | INTRAMUSCULAR | Status: DC | PRN

## 2015-07-03 MED ORDER — SUGAMMADEX SODIUM 200 MG/2ML IV SOLN
INTRAVENOUS | Status: AC
  Filled 2015-07-03: qty 4

## 2015-07-03 MED ORDER — PROPOFOL 10 MG/ML IV BOLUS
INTRAVENOUS | Status: DC | PRN
  Administered 2015-07-03: 180 mg via INTRAVENOUS
  Administered 2015-07-03: 80 mg via INTRAVENOUS

## 2015-07-03 MED ORDER — FENTANYL CITRATE (PF) 100 MCG/2ML IJ SOLN
INTRAMUSCULAR | Status: AC
  Filled 2015-07-03: qty 2

## 2015-07-03 MED ORDER — PHENYLEPHRINE HCL 10 MG/ML IJ SOLN
INTRAMUSCULAR | Status: DC | PRN
  Administered 2015-07-03: 40 ug via INTRAVENOUS
  Administered 2015-07-03 (×2): 120 ug via INTRAVENOUS
  Administered 2015-07-03: 80 ug via INTRAVENOUS

## 2015-07-03 MED ORDER — CEFAZOLIN SODIUM 1-5 GM-% IV SOLN: 1.0000 g | Freq: Once | INTRAVENOUS | Status: DC

## 2015-07-03 MED ORDER — FENTANYL CITRATE (PF) 100 MCG/2ML IJ SOLN
INTRAMUSCULAR | Status: DC | PRN
  Administered 2015-07-03 (×2): 25 ug via INTRAVENOUS

## 2015-07-03 MED ORDER — ONDANSETRON HCL 4 MG/2ML IJ SOLN
INTRAMUSCULAR | Status: DC | PRN
  Administered 2015-07-03: 4 mg via INTRAVENOUS

## 2015-07-03 MED ORDER — LIDOCAINE HCL (CARDIAC) 20 MG/ML IV SOLN
INTRAVENOUS | Status: DC | PRN
  Administered 2015-07-03: 70 mg via INTRAVENOUS

## 2015-07-03 MED ORDER — OXYCODONE HCL 5 MG PO TABS
ORAL_TABLET | ORAL | Status: AC
  Filled 2015-07-03: qty 2

## 2015-07-03 MED ORDER — LACTATED RINGERS IV SOLN
INTRAVENOUS | Status: DC | PRN
  Administered 2015-07-03: 16:00:00 via INTRAVENOUS

## 2015-07-03 MED ORDER — OXYCODONE HCL 5 MG PO TABS: 5.0000 mg | ORAL_TABLET | Freq: Once | ORAL | Status: DC | PRN

## 2015-07-03 MED ORDER — 0.9 % SODIUM CHLORIDE (POUR BTL) OPTIME
TOPICAL | Status: DC | PRN
  Administered 2015-07-03: 1000 mL

## 2015-07-03 MED ORDER — CEFAZOLIN SODIUM-DEXTROSE 2-3 GM-% IV SOLR
2.0000 g | INTRAVENOUS | Status: AC
  Administered 2015-07-03: 2 g via INTRAVENOUS

## 2015-07-03 MED ORDER — ASPIRIN EC 325 MG PO TBEC: 325.0000 mg | DELAYED_RELEASE_TABLET | Freq: Two times a day (BID) | ORAL | Status: DC

## 2015-07-03 MED ORDER — METOCLOPRAMIDE HCL 5 MG/ML IJ SOLN: 5.0000 mg | Freq: Three times a day (TID) | INTRAMUSCULAR | Status: DC | PRN

## 2015-07-03 MED ORDER — INFLUENZA VAC SPLIT QUAD 0.5 ML IM SUSY
0.5000 mL | PREFILLED_SYRINGE | INTRAMUSCULAR | Status: AC
  Administered 2015-07-04: 0.5 mL via INTRAMUSCULAR
  Filled 2015-07-03: qty 0.5

## 2015-07-03 MED ORDER — KCL IN DEXTROSE-NACL 20-5-0.45 MEQ/L-%-% IV SOLN
INTRAVENOUS | Status: DC
  Administered 2015-07-03 (×2): via INTRAVENOUS
  Filled 2015-07-03: qty 1000

## 2015-07-03 MED ORDER — SUGAMMADEX SODIUM 200 MG/2ML IV SOLN
INTRAVENOUS | Status: DC | PRN
  Administered 2015-07-03: 250 mg via INTRAVENOUS

## 2015-07-03 MED ORDER — BUPIVACAINE-EPINEPHRINE (PF) 0.5% -1:200000 IJ SOLN
INTRAMUSCULAR | Status: DC | PRN
  Administered 2015-07-03 (×2): 20 mL via PERINEURAL

## 2015-07-03 MED ORDER — ASPIRIN 325 MG PO TABS
325.0000 mg | ORAL_TABLET | Freq: Two times a day (BID) | ORAL | Status: DC
  Administered 2015-07-04 – 2015-07-06 (×5): 325 mg via ORAL
  Filled 2015-07-03 (×5): qty 1

## 2015-07-03 SURGICAL SUPPLY — 68 items
BANDAGE ELASTIC 4 VELCRO ST LF (GAUZE/BANDAGES/DRESSINGS) ×3 IMPLANT
BANDAGE ELASTIC 6 VELCRO ST LF (GAUZE/BANDAGES/DRESSINGS) ×1 IMPLANT
BANDAGE ESMARK 6X9 LF (GAUZE/BANDAGES/DRESSINGS) ×1 IMPLANT
BIT DRILL 2.5X2.75 QC CALB (BIT) ×2 IMPLANT
BIT DRILL 2.9X70 QC CALB (BIT) ×2 IMPLANT
BIT DRILL CALIBRATED 2.7 (BIT) ×1 IMPLANT
BIT DRILL CALIBRATED 2.7MM (BIT) ×1
BNDG CMPR 9X6 STRL LF SNTH (GAUZE/BANDAGES/DRESSINGS) ×1
BNDG ESMARK 6X9 LF (GAUZE/BANDAGES/DRESSINGS) ×3
COVER SURGICAL LIGHT HANDLE (MISCELLANEOUS) ×5 IMPLANT
CUFF TOURNIQUET SINGLE 18IN (TOURNIQUET CUFF) IMPLANT
CUFF TOURNIQUET SINGLE 24IN (TOURNIQUET CUFF) ×2 IMPLANT
CUFF TOURNIQUET SINGLE 34IN LL (TOURNIQUET CUFF) IMPLANT
CUFF TOURNIQUET SINGLE 44IN (TOURNIQUET CUFF) IMPLANT
DECANTER SPIKE VIAL GLASS SM (MISCELLANEOUS) IMPLANT
DRAPE OEC MINIVIEW 54X84 (DRAPES) IMPLANT
DRAPE U-SHAPE 47X51 STRL (DRAPES) ×3 IMPLANT
DRSG PAD ABDOMINAL 8X10 ST (GAUZE/BANDAGES/DRESSINGS) ×3 IMPLANT
DURAPREP 26ML APPLICATOR (WOUND CARE) ×3 IMPLANT
ELECT REM PT RETURN 9FT ADLT (ELECTROSURGICAL) ×3
ELECTRODE REM PT RTRN 9FT ADLT (ELECTROSURGICAL) ×1 IMPLANT
FIXATION ZIPTIGHT ANKLE SNDSMS (Ankle) IMPLANT
GAUZE SPONGE 4X4 12PLY STRL (GAUZE/BANDAGES/DRESSINGS) ×3 IMPLANT
GAUZE XEROFORM 1X8 LF (GAUZE/BANDAGES/DRESSINGS) ×3 IMPLANT
GLOVE BIO SURGEON STRL SZ7.5 (GLOVE) ×3 IMPLANT
GLOVE BIO SURGEON STRL SZ8.5 (GLOVE) ×3 IMPLANT
GLOVE BIOGEL PI IND STRL 8 (GLOVE) ×1 IMPLANT
GLOVE BIOGEL PI IND STRL 9 (GLOVE) ×1 IMPLANT
GLOVE BIOGEL PI INDICATOR 8 (GLOVE) ×2
GLOVE BIOGEL PI INDICATOR 9 (GLOVE) ×2
GOWN STRL REUS W/ TWL LRG LVL3 (GOWN DISPOSABLE) ×3 IMPLANT
GOWN STRL REUS W/ TWL XL LVL3 (GOWN DISPOSABLE) ×1 IMPLANT
GOWN STRL REUS W/TWL LRG LVL3 (GOWN DISPOSABLE) ×9
GOWN STRL REUS W/TWL XL LVL3 (GOWN DISPOSABLE) ×3
K-WIRE ACE 1.6X6 (WIRE) ×3
KIT BASIN OR (CUSTOM PROCEDURE TRAY) ×3 IMPLANT
KIT ROOM TURNOVER OR (KITS) ×3 IMPLANT
KWIRE ACE 1.6X6 (WIRE) IMPLANT
MANIFOLD NEPTUNE II (INSTRUMENTS) ×3 IMPLANT
NS IRRIG 1000ML POUR BTL (IV SOLUTION) ×3 IMPLANT
PACK ORTHO EXTREMITY (CUSTOM PROCEDURE TRAY) ×3 IMPLANT
PAD ARMBOARD 7.5X6 YLW CONV (MISCELLANEOUS) ×6 IMPLANT
PAD CAST 4YDX4 CTTN HI CHSV (CAST SUPPLIES) ×2 IMPLANT
PADDING CAST COTTON 4X4 STRL (CAST SUPPLIES) ×3
PLATE LOCK 6H 77 BILAT FIB (Plate) ×2 IMPLANT
SCREW LOCK CORT STAR 3.5X10 (Screw) ×2 IMPLANT
SCREW LOCK CORT STAR 3.5X12 (Screw) ×2 IMPLANT
SCREW LOCK CORT STAR 3.5X14 (Screw) ×2 IMPLANT
SCREW LOCK CORT STAR 3.5X16 (Screw) ×4 IMPLANT
SCREW LOW PROFILE 18MMX3.5MM (Screw) ×4 IMPLANT
SCREW NLOCK CANC HEX 4X30 (Screw) ×3 IMPLANT
SCREW NLOCK CANC HEX 4X35 (Screw) ×2 IMPLANT
SCREW NLOCK T15 FT 30X4XST TIP (Screw) IMPLANT
SCREW NON LOCKING LP 3.5 16MM (Screw) ×6 IMPLANT
SPONGE GAUZE 4X4 12PLY STER LF (GAUZE/BANDAGES/DRESSINGS) ×2 IMPLANT
SPONGE LAP 4X18 X RAY DECT (DISPOSABLE) ×6 IMPLANT
STAPLER VISISTAT 35W (STAPLE) ×3 IMPLANT
SUCTION FRAZIER HANDLE 10FR (MISCELLANEOUS) ×2
SUCTION TUBE FRAZIER 10FR DISP (MISCELLANEOUS) ×1 IMPLANT
SUT VIC AB 2-0 CTB1 (SUTURE) ×5 IMPLANT
SUT VIC AB 3-0 FS2 27 (SUTURE) ×8 IMPLANT
TOWEL OR 17X24 6PK STRL BLUE (TOWEL DISPOSABLE) ×3 IMPLANT
TOWEL OR 17X26 10 PK STRL BLUE (TOWEL DISPOSABLE) ×3 IMPLANT
TUBE CONNECTING 12'X1/4 (SUCTIONS) ×1
TUBE CONNECTING 12X1/4 (SUCTIONS) ×2 IMPLANT
WATER STERILE IRR 1000ML POUR (IV SOLUTION) ×1 IMPLANT
YANKAUER SUCT BULB TIP NO VENT (SUCTIONS) IMPLANT
ZIPTIGHT ANKLE SYNODESMOSS FIX (Ankle) ×6 IMPLANT

## 2015-07-03 NOTE — Transfer of Care (Signed)
Immediate Anesthesia Transfer of Care Note  Patient: [name]  Procedure(s) Performed: Procedure(s): OPEN REDUCTION INTERNAL FIXATION (ORIF) ANKLE FRACTURE (Right)  Patient Location: PACU  Anesthesia Type:General  Level of Consciousness: awake, alert  and oriented  Airway & Oxygen Therapy: Patient Spontanous Breathing and Patient connected to face mask oxygen  Post-op Assessment: Report given to RN and Post -op Vital signs reviewed and stable  Post vital signs: Reviewed and stable  Last Vitals:  Filed Vitals:   07/03/15 1433 07/03/15 1834  BP: 115/59 112/57  Pulse: 73 89  Temp: 36.7 C   Resp: 18     Complications: No apparent anesthesia complications

## 2015-07-03 NOTE — Progress Notes (Signed)
Pharmacy Antibiotic Note  CYNDIE WOODBECK is a 65 y.o. female admitted on 07/02/2015 with UTI.  Pharmacy has been consulted for Ceftriaxone dosing.  Plan: Rocephine 1gm IV q24h. Pharmacy will sign off - please reconsult if needed  Height:  (157.5 cm) Weight: 271 lb 2.7 oz (123 kg) IBW/kg (Calculated) : 50.1  Temp (24hrs), Avg:98 F (36.7 C), Min:97.7 F (36.5 C), Max:98.2 F (36.8 C)   Recent Labs Lab 07/02/15 1200 07/02/15 1535  WBC 13.2*  --   CREATININE 0.85 0.70    Estimated Creatinine Clearance: 90.1 mL/min (by C-G formula based on Cr of 0.7).    Allergies  Allergen Reactions  . Compazine Shortness Of Breath  . Prochlorperazine Maleate   . Topamax [Topiramate]   . Codeine Sulfate Nausea Only  . Adhesive [Tape] Rash  . Iodinated Diagnostic Agents Rash    Antimicrobials this admission: 2/22 Rocephin >>    Thank you for allowing pharmacy to be a part of this patient's care.  Christoper Fabian, PharmD, BCPS Clinical pharmacist, pager [phone] 07/03/2015 3:05 AM
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
## 2015-07-03 NOTE — Progress Notes (Signed)
Echocardiogram 2D Echocardiogram has been performed.  [name] 07/03/2015, 2:29 PM
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
## 2015-07-03 NOTE — Progress Notes (Signed)
Placed patient on home CPAP set at 12cm

## 2015-07-03 NOTE — Anesthesia Procedure Notes (Addendum)
Anesthesia Regional Block:  Popliteal block  Pre-Anesthetic Checklist: ,, timeout performed, Correct Patient, Correct Site, Correct Laterality, Correct Procedure, Correct Position, site marked, Risks and benefits discussed,  Surgical consent,  Pre-op evaluation,  At surgeon's request and post-op pain management  Laterality: Right  Prep: chloraprep       Needles:  Injection technique: Single-shot  Needle Type: Echogenic Stimulator Needle     Needle Length: 9cm 9 cm Needle Gauge: 21 and 21 G    Additional Needles:  Procedures: ultrasound guided (picture in chart) and nerve stimulator Popliteal block  Nerve Stimulator or Paresthesia:  Response: plantar flexion of foot, 0.45 mA,   Additional Responses:   Narrative:  Start time: 07/03/2015 3:40 PM End time: 07/03/2015 3:49 PM Injection made incrementally with aspirations every 5 mL.  Performed by: Personally  Anesthesiologist: HODIERNE, ADAM  Additional Notes: Functioning IV was confirmed and monitors were applied.  A 90mm 21ga Arrow echogenic stimulator needle was used. Sterile prep and drape,hand hygiene and sterile gloves were used.  Negative aspiration and negative test dose prior to incremental administration of local anesthetic (20 mL of 0.5% bupivacaine +epi was injected). The patient tolerated the procedure well.  Ultrasound guidance: relevent anatomy identified, needle position confirmed, local anesthetic spread visualized around nerve(s), vascular puncture avoided.  Image printed for medical record.    Right side adductor canal block also performed.  Sterile technique and ultrasound used.  20 mL of 0.5% bupivacaine + epi was injected at this site.  Pt tolerate the procedure well.   Procedure Name: Intubation Date/Time: 07/03/2015 4:17 PM Performed by: Virgel Gess LEFFEW Pre-anesthesia Checklist: Patient identified, Patient being monitored, Timeout performed, Emergency Drugs available and Suction available Patient  Re-evaluated:Patient Re-evaluated prior to inductionOxygen Delivery Method: Circle System Utilized Preoxygenation: Pre-oxygenation with 100% oxygen Intubation Type: IV induction Ventilation: Two handed mask ventilation required Laryngoscope Size: Mac and 3 Grade View: Grade III Tube type: Oral Tube size: 7.5 mm Number of attempts: 1 Airway Equipment and Method: Stylet Placement Confirmation: ETT inserted through vocal cords under direct vision,  positive ETCO2 and breath sounds checked- equal and bilateral Secured at: 21 cm Tube secured with: Tape Dental Injury: Teeth and Oropharynx as per pre-operative assessment  Comments: Intubated by Hodierne MD    Procedure Name: LMA Insertion Date/Time: 07/03/2015 4:07 PM Performed by: Virgel Gess LEFFEW Pre-anesthesia Checklist: Patient identified, Emergency Drugs available, Suction available, Timeout performed and Patient being monitored Patient Re-evaluated:Patient Re-evaluated prior to inductionOxygen Delivery Method: Circle system utilized Preoxygenation: Pre-oxygenation with 100% oxygen Intubation Type: IV induction Ventilation: Mask ventilation without difficulty LMA: LMA inserted LMA Size: 5.0 Number of attempts: 1 Tube secured with: Tape Dental Injury: Teeth and Oropharynx as per pre-operative assessment

## 2015-07-03 NOTE — Interval H&P Note (Signed)
History and Physical Interval Note:  07/03/2015 3:24 PM  [name]  has presented today for surgery, with the diagnosis of Right Ankle Fx  The various methods of treatment have been discussed with the patient and family. After consideration of risks, benefits and other options for treatment, the patient has consented to  Procedure(s): OPEN REDUCTION INTERNAL FIXATION (ORIF) ANKLE FRACTURE (Right) as a surgical intervention .  The patient's history has been reviewed, patient examined, no change in status, stable for surgery.  I have reviewed the patient's chart and labs.  Questions were answered to the patient's satisfaction.     Nestor Lewandowsky

## 2015-07-03 NOTE — Anesthesia Preprocedure Evaluation (Signed)
Anesthesia Evaluation  Patient identified by MRN, date of birth, ID band Patient awake    Reviewed: Allergy & Precautions, NPO status , Patient's Chart, lab work & pertinent test results  Airway Mallampati: II   Neck ROM: full    Dental   Pulmonary asthma , sleep apnea , former smoker,    breath sounds clear to auscultation       Cardiovascular hypertension, + Peripheral Vascular Disease   Rhythm:regular Rate:Normal     Neuro/Psych  Headaches, Seizures -,   Neuromuscular disease    GI/Hepatic GERD  ,  Endo/Other  Morbid obesity  Renal/GU      Musculoskeletal  (+) Arthritis ,   Abdominal   Peds  Hematology   Anesthesia Other Findings   Reproductive/Obstetrics                             Anesthesia Physical Anesthesia Plan  ASA: III  Anesthesia Plan: General and Regional   Post-op Pain Management: MAC Combined w/ Regional for Post-op pain   Induction: Intravenous  Airway Management Planned: LMA  Additional Equipment:   Intra-op Plan:   Post-operative Plan:   Informed Consent: I have reviewed the patients History and Physical, chart, labs and discussed the procedure including the risks, benefits and alternatives for the proposed anesthesia with the patient or authorized representative who has indicated his/her understanding and acceptance.     Plan Discussed with: CRNA, Anesthesiologist and Surgeon  Anesthesia Plan Comments:         Anesthesia Quick Evaluation

## 2015-07-03 NOTE — Progress Notes (Signed)
PT Cancellation Note  Patient Details Name: DENZIL BRISTOL MRN: [id number] DOB: 1950/12/15   Cancelled Treatment:    Reason Eval/Treat Not Completed: Patient not medically ready, pt going for ORIF today to stabilize ankle, will wait until after surgery to mobilize pt, please re order at that time with WB status.    Yareli Carthen, [place] 07/03/2015, 11:15 AM

## 2015-07-03 NOTE — Progress Notes (Signed)
Attempted to bring pt for EEG, RN stated that the patient was going to surgery at 3:00.
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
## 2015-07-03 NOTE — Progress Notes (Signed)
Orthopedic Tech Progress Note Patient Details:  [name] 1951/03/02 [id number]  Ortho Devices Type of Ortho Device: CAM walker Ortho Device/Splint Interventions: Ordered, Application   Jennye Moccasin 07/03/2015, 6:15 PM
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
## 2015-07-04 ENCOUNTER — Ambulatory Visit (HOSPITAL_COMMUNITY): Payer: BC Managed Care – PPO

## 2015-07-04 ENCOUNTER — Encounter (HOSPITAL_COMMUNITY): Payer: Self-pay | Admitting: Orthopedic Surgery

## 2015-07-04 DIAGNOSIS — R600 Localized edema: Secondary | ICD-10-CM

## 2015-07-04 LAB — GLUCOSE, CAPILLARY
Glucose-Capillary: 101 mg/dL — ABNORMAL HIGH (ref 65–99)
Glucose-Capillary: 122 mg/dL — ABNORMAL HIGH (ref 65–99)

## 2015-07-04 MED ORDER — CHLORHEXIDINE GLUCONATE CLOTH 2 % EX PADS
6.0000 | MEDICATED_PAD | Freq: Every day | CUTANEOUS | Status: DC
  Administered 2015-07-04 – 2015-07-06 (×2): 6 via TOPICAL

## 2015-07-04 MED ORDER — AMLODIPINE BESYLATE 10 MG PO TABS
10.0000 mg | ORAL_TABLET | Freq: Every day | ORAL | Status: DC
  Administered 2015-07-04 – 2015-07-05 (×2): 10 mg via ORAL
  Filled 2015-07-04 (×2): qty 1

## 2015-07-04 MED ORDER — MUPIROCIN 2 % EX OINT
1.0000 "application " | TOPICAL_OINTMENT | Freq: Two times a day (BID) | CUTANEOUS | Status: DC
  Administered 2015-07-04 – 2015-07-06 (×5): 1 via NASAL
  Filled 2015-07-04: qty 22
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
## 2015-07-04 NOTE — Progress Notes (Signed)
Inpatient Rehabilitation  Patient was screened by Fae Pippin for appropriateness for an Inpatient Acute Rehab consult.  At this time we are not recommending an Inpatient Rehab consult.  Patient does not meet medical necessity criteria for an IP Rehab admission.  Please consider SNF versus home health.      Charlane Ferretti., CCC/SLP Admission Coordinator  [facility] Inpatient Rehabilitation  Cell [phone]

## 2015-07-04 NOTE — Anesthesia Postprocedure Evaluation (Signed)
Anesthesia Post Note  Patient: [name]  Procedure(s) Performed: Procedure(s) (LRB): OPEN REDUCTION INTERNAL FIXATION (ORIF) ANKLE FRACTURE (Right)  Patient location during evaluation: PACU Anesthesia Type: General Level of consciousness: awake and alert and patient cooperative Pain management: pain level controlled Vital Signs Assessment: post-procedure vital signs reviewed and stable Respiratory status: spontaneous breathing and respiratory function stable Cardiovascular status: stable Anesthetic complications: no    Last Vitals:  Filed Vitals:   07/03/15 2327 07/04/15 0524  BP: 130/51 107/46  Pulse: 75 85  Temp: 37 C 36.7 C  Resp: 18 18    Last Pain:  Filed Vitals:   07/04/15 0700  PainSc: 2                  Samuella Rasool S
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
## 2015-07-04 NOTE — Progress Notes (Signed)
Subjective: 1 Day Post-Op Procedure(s) (LRB): OPEN REDUCTION INTERNAL FIXATION (ORIF) ANKLE FRACTURE (Right) Patient reports pain as 3 on 0-10 scale.    Objective: Vital signs in last 24 hours: Temp:  [97.8 F (36.6 C)-98.6 F (37 C)] 98.1 F (36.7 C) (02/23 0524) Pulse Rate:  [73-90] 85 (02/23 0524) Resp:  [16-19] 18 (02/23 0524) BP: (101-133)/(46-59) 107/46 mmHg (02/23 0524) SpO2:  [93 %-98 %] 96 % (02/23 0524) Weight:  [124.1 kg (273 lb 9.5 oz)] 124.1 kg (273 lb 9.5 oz) (02/23 0500)  Intake/Output from previous day: 02/22 0701 - 02/23 0700 In: 1993.3 [P.O.:970; I.V.:1023.3] Out: 1500 [Urine:1500] Intake/Output this shift:     Recent Labs  07/02/15 1200 07/02/15 1535  HGB 12.5 12.2    Recent Labs  07/02/15 1200 07/02/15 1535  WBC 13.2*  --   RBC 4.14  --   HCT 37.9 36.0  PLT 186  --     Recent Labs  07/02/15 1200 07/02/15 1535 07/02/15 1819  NA 139 138  --   K 4.0 4.1  --   CL 105 101  --   CO2 25  --   --   BUN 17 20  --   CREATININE 0.85 0.70  --   GLUCOSE 137* 116*  --   CALCIUM 9.5  --  9.5   No results for input(s): LABPT, INR in the last 72 hours.  Neurologically intact ABD soft Neurovascular intact Sensation intact distally Intact pulses distally Dorsiflexion/Plantar flexion intact Incision: dressing C/D/I No cellulitis present Compartment soft  Assessment/Plan: 1 Day Post-Op Procedure(s) (LRB): OPEN REDUCTION INTERNAL FIXATION (ORIF) ANKLE FRACTURE (Right)  Plan: Toe-touch weightbearing with physical therapy for the next 4 weeks,aspirin 325 twice a day for 2 weeks for DVT prophylaxis, Percocet when necessary for pain control.  Depending how she does with physical therapy  The patient may be able to go home versus a stay in rehabilitation.  Her elevated BMI, back pain (Recent CT myelogram showed a mild foraminal stenosis), and now ankle fracture may make management at home difficult.   [name] J 07/04/2015, 8:02 AM

## 2015-07-05 ENCOUNTER — Inpatient Hospital Stay (HOSPITAL_COMMUNITY): Payer: BC Managed Care – PPO

## 2015-07-05 DIAGNOSIS — R569 Unspecified convulsions: Secondary | ICD-10-CM

## 2015-07-05 LAB — URINE CULTURE: Culture: >=100000

## 2015-07-05 LAB — GLUCOSE, CAPILLARY: Glucose-Capillary: 121 mg/dL — ABNORMAL HIGH (ref 65–99)

## 2015-07-05 NOTE — Progress Notes (Signed)
Patient has accepted bed offer at [facility]. Facility has been informed of patient and family's selection. Per facility representative, they will not be able to take her until tomorrow. CSW informed patient and family of transfer date. Patient and family appreciative of CSW services.  Patient to be transported via [facility] to [facility] when medically stable and ready for discharge. D/C Summary to be sent via Hub system. No further needs were requested at this time.   CSW will continue to follow and provide support to patient while in hospital.   Fernande Boyden, [facility] Clinical Social Worker [facility] Ph: [phone]
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
## 2015-07-05 NOTE — Progress Notes (Signed)
PATIENT ID: [name]  MRN: [id number]  DOB/AGE:  October 18, 1950 / 65 y.o.  2 Days Post-Op Procedure(s) (LRB): OPEN REDUCTION INTERNAL FIXATION (ORIF) ANKLE FRACTURE (Right)    PROGRESS NOTE Subjective:   Patient is alert, oriented, no Nausea, no Vomiting, yes passing gas, no Bowel Movement. Taking PO well. Denies SOB, Chest or Calf Pain. Using Incentive Spirometer, PAS in place. Ambulate  Toe touch weight bearing with pt nat able to support upper body, Patient reports pain as 0 on 0-10 scale,     Objective: Vital signs in last 24 hours: Temp:  [97.6 F (36.4 C)-98.5 F (36.9 C)] 97.8 F (36.6 C) (02/24 0505) Pulse Rate:  [72-90] 83 (02/24 0505) Resp:  [16-18] 18 (02/24 0505) BP: (107-130)/(42-51) 123/51 mmHg (02/24 0505) SpO2:  [93 %-100 %] 96 % (02/24 0505) Weight:  [123.7 kg (272 lb 11.3 oz)] 123.7 kg (272 lb 11.3 oz) (02/24 0505)    Intake/Output from previous day: I/O last 3 completed shifts: In: 1773.3 [P.O.:1450; I.V.:323.3] Out: 975 [Urine:975]   Intake/Output this shift:     LABORATORY DATA:  Recent Labs  07/02/15 1200 07/02/15 1535 07/02/15 1819 07/03/15 0523 07/04/15 0541 07/04/15 0731  WBC 13.2*  --   --   --   --   --   HGB 12.5 12.2  --   --   --   --   HCT 37.9 36.0  --   --   --   --   PLT 186  --   --   --   --   --   NA 139 138  --   --   --   --   K 4.0 4.1  --   --   --   --   CL 105 101  --   --   --   --   CO2 25  --   --   --   --   --   BUN 17 20  --   --   --   --   CREATININE 0.85 0.70  --   --   --   --   GLUCOSE 137* 116*  --   --   --   --   GLUCAP  --   --   --  116* 101* 122*  CALCIUM 9.5  --  9.5  --   --   --     Examination: Neurologically intact Neurovascular intact Sensation intact distally} Pt able to wiggle toes with out issue Assessment:   2 Days Post-Op Procedure(s) (LRB): OPEN REDUCTION INTERNAL FIXATION (ORIF) ANKLE FRACTURE (Right) ADDITIONAL DIAGNOSIS:   Plan:  Partial Weight Bearing @ toe touch %  (PWB)  DVT Prophylaxis:  Aspirin  DISCHARGE PLAN: Skilled Nursing Facility/Rehab, pt will need stay at rehab facility as she is unable to support self and use walker.  DISCHARGE NEEDS: Walker and 3-in-1 comode seat     Daeshaun Specht R 07/05/2015, 7:31 AM
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
## 2015-07-05 NOTE — Clinical Social Work Note (Signed)
Clinical Social Work Assessment  Patient Details  Name: [name] MRN: [id number] Date of Birth: 1950/12/17  Date of referral:  07/05/15               Reason for consult:  Facility Placement                Permission sought to share information with:  Case Manager, Family Supports Permission granted to share information::  Yes, Verbal Permission Granted  Name::     [name]   Agency::     Relationship::  Husband   Contact Information:  [phone]  Housing/Transportation Living arrangements for the past 2 months:  Single Family Home (With Husband ) Source of Information:  Patient, Spouse Patient Interpreter Needed:  None Criminal Activity/Legal Involvement Pertinent to Current Situation/Hospitalization:  No - Comment as needed Significant Relationships:  Spouse Lives with:  Spouse Do you feel safe going back to the place where you live?  Yes Need for family participation in patient care:  Yes (Comment)  Care giving concerns:  Patient's Husband Annette Stable reports they live in a two story home and he does not feel safe with her returning home in her current state. Husband reports he is in the process of working some things out within the home because all of the bedrooms are upstairs. Husband reports concerns for her safety and believes she would benefit best from SNF placement.    Social Worker assessment / plan:  CSW received consult by MD. CSW went to speak with patient regarding the possible need for short term rehab. CSW introduced self and acknowledged the patient. Patient is alert and orientedx4. Patient was calm and cooperative with CSW assessment. Patient's husband Annette Stable and [employer] Primas was at bedside. Patient provided CSW with permission to speak while family is in room. CSW informed patient of PT recommendation for short term rehab. Patient and family is agreeable to SNF placement. CSW was provided permission to fax clinical information out to the facilities in [facility]. Patient and family do not have any preferences at this time. CSW completed FL2 for MD signature. CSW to initiate SNF placement process.   Patient and family have been provided bed offers. At this time, patient and family are unsure of what facility they would like to go with. Family asked CSW about preference, however CSW informed patient and family that CSW is not allowed to make preference for facilities. CSW will follow up with family regarding facility choice. Patient and family is aware that CSW will follow up with facility choices regarding bed availability on today.     Employment status:  Disabled (Comment on whether or not currently receiving Disability) Insurance information:  Managed Medicare PT Recommendations:  Skilled Nursing Facility Information / Referral to community resources:     Patient/Family's Response to care:  Patient and family is agreeable to SNF placement for patient.   Patient/Family's Understanding of and Emotional Response to Diagnosis, Current Treatment, and Prognosis:  Patient and family were very cooperative with CSW assessment. Family is very involved in patient's care and is aware of current treatment and prognosis. Patient and family is agreeable to disposition plan. Husband and patient are hopeful for patient's progress and return back home. Patient and family are appreciative of CSW intervention.    Emotional Assessment Appearance:  Appears stated age Attitude/Demeanor/Rapport:   (Calm and Cooperative) Affect (typically observed):  Accepting, Appropriate, Calm Orientation:  Oriented to Self, Oriented to Place, Oriented to  Time, Oriented  to Situation Alcohol / Substance use:  Not Applicable Psych involvement (Current and /or in the community):  No (Comment)  Discharge Needs  Concerns to be addressed:  Discharge Planning Concerns Readmission within the last 30 days:  No Current discharge risk:  None Barriers to Discharge:  Barriers  Resolved   Loleta Dicker, LCSW 07/05/2015, 3:10 PM

## 2015-07-05 NOTE — Progress Notes (Signed)
Physical medicine rehabilitation consult requested. Patient does not meet medical necessity for inpatient rehabilitation services with current diagnosis. It is recommended skilled nursing facility versus home health therapies. Hold on formal rehabilitation consult at this time with recommendations of skilled nursing facility versus home health therapies.
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
## 2015-07-05 NOTE — Progress Notes (Signed)
EEG completed; results pending.    

## 2015-07-05 NOTE — Procedures (Signed)
ELECTROENCEPHALOGRAM REPORT  Date of Study: 07/05/2015  Patient's Name: [name] MRN: [id number] Date of Birth: Oct 08, 1950  Referring Provider: Caryl Comes, MD  Indication: 65 year old female with remote history of seizures, on phenobarbital, with breakthrough seizure  Medications: Phenobarbital Venlafaxine  Pramipexole Oxycodone  Technical Summary: This is a multichannel digital EEG recording, using the international 10-20 placement system with electrodes applied with paste and impedances below 5000 ohms.    Description: The EEG background is symmetric and mildly, with a posterior dominant rhythm of 7 Hz, which is reactive to eye opening and closing.  Diffuse beta activity is seen, with a bilateral frontal preponderance.  No focal abnormalities are seen.  No focal or generalized epileptiform discharges are seen.  Stage II sleep is seen, with normal and symmetric sleep patterns.  Hyperventilation and photic stimulation were performed, and produced no abnormalities.  ECG revealed normal cardiac rate and rhythm.  Impression: This is a mildly abnormal routine EEG of the awake and asleep states due to mild slowing.  Although this may be due to diffuse cerebral dysfunction, it may also be seen in excessive sleepiness.  Adam R. Everlena Cooper, DO

## 2015-07-05 NOTE — [place] FL2 (Signed)
[place] MEDICAID FL2 LEVEL OF CARE SCREENING TOOL     IDENTIFICATION  Patient Name: [name] Birthdate: June 21, 1950 Sex: female Admission Date (Current Location): 07/02/2015  [facility] and [place] Number:  Producer, television/film/video and Address:  [facility], [address], [place], [place] [address]      Provider Number: [id number]  Attending Physician Name and Address:  Maretta Bees, MD  Relative Name and Phone Number:       Current Level of Care: Hospital Recommended Level of Care: Skilled Nursing Facility Prior Approval Number:    Date Approved/Denied: 07/05/15 PASRR Number: [id number]  Discharge Plan: SNF    Current Diagnoses: Patient Active Problem List   Diagnosis Date Noted  . Fracture of ankle, trimalleolar, closed 07/03/2015  . Syncope 07/02/2015  . Obesity, morbid, BMI 40.0-49.9 (HCC) 07/02/2015  . Seizure disorder (HCC) 07/02/2015  . HTN (hypertension) 07/02/2015  . Bilateral lower extremity edema 07/02/2015  . Closed right ankle fracture 07/02/2015  . GERD (gastroesophageal reflux disease) 07/02/2015  . HLD (hyperlipidemia) 07/02/2015  . Anosmia/chronic 07/02/2015  . Leukocytosis 07/02/2015  . Restless legs syndrome (RLS) 04/28/2013  . Sleep apnea with use of continuous positive airway pressure (CPAP) 01/24/2013    Orientation RESPIRATION BLADDER Height & Weight     Self, Time, Situation, Place  Normal Continent Weight: 272 lb 11.3 oz (123.7 kg) Height:   (157.5 cm)  BEHAVIORAL SYMPTOMS/MOOD NEUROLOGICAL BOWEL NUTRITION STATUS      Continent    AMBULATORY STATUS COMMUNICATION OF NEEDS Skin   Extensive Assist Verbally  (incision)                       Personal Care Assistance Level of Assistance  Bathing, Feeding, Dressing Bathing Assistance: Maximum assistance Feeding assistance: Independent Dressing Assistance: Maximum assistance     Functional Limitations Info  Sight, Hearing, Speech Sight Info:  Adequate Hearing Info: Adequate Speech Info: Adequate    SPECIAL CARE FACTORS FREQUENCY  PT (By licensed PT), OT (By licensed OT)                    Contractures      Additional Factors Info  Code Status, Allergies Code Status Info: FULL Allergies Info: Compazine, Prochlorperazine Maleate, Topamax, Codeine Sulfate, Adhesive, Iodinated Diagnostic Agents           Current Medications (07/05/2015):  This is the current hospital active medication list Current Facility-Administered Medications  Medication Dose Route Frequency Provider Last Rate Last Dose  . acetaminophen (TYLENOL) tablet 650 mg  650 mg Oral Q6H PRN Russella Dar, NP   650 mg at 07/05/15 0458   Or  . acetaminophen (TYLENOL) suppository 650 mg  650 mg Rectal Q6H PRN Russella Dar, NP      . alum & mag hydroxide-simeth (MAALOX/MYLANTA) 200-200-20 MG/5ML suspension 30 mL  30 mL Oral Q6H PRN Russella Dar, NP   30 mL at 07/04/15 2353  . amLODipine (NORVASC) tablet 10 mg  10 mg Oral Daily Maretta Bees, MD   10 mg at 07/04/15 2028  . aspirin tablet 325 mg  325 mg Oral BID Allena Katz, PA-C   325 mg at 07/04/15 2304  . bisacodyl (DULCOLAX) suppository 10 mg  10 mg Rectal Daily PRN Russella Dar, NP      . cefTRIAXone (ROCEPHIN) 1 g in dextrose 5 % 50 mL IVPB  1 g Intravenous Q0600 Rolan Bucco  G Amend, RPH   1 g at 07/05/15 0742  . Chlorhexidine Gluconate Cloth 2 % PADS 6 each  6 each Topical Daily Maretta Bees, MD   6 each at 07/04/15 [phone]  . dextrose 5 % and 0.45 % NaCl with KCl 20 mEq/L infusion   Intravenous Continuous Allena Katz, PA-C 100 mL/hr at 07/03/15 2151    . docusate sodium (COLACE) capsule 100 mg  100 mg Oral BID Russella Dar, NP   100 mg at 07/04/15 2306  . fluticasone (FLONASE) 50 MCG/ACT nasal spray 2 spray  2 spray Each Nare Daily Russella Dar, NP   2 spray at 07/04/15 1000  . furosemide (LASIX) tablet 20 mg  20 mg Oral Daily Russella Dar, NP   20 mg at 07/04/15 0931  .  heparin injection 5,000 Units  5,000 Units Subcutaneous 3 times per day Russella Dar, NP   5,000 Units at 07/05/15 8295  . hydrALAZINE (APRESOLINE) injection 10 mg  10 mg Intravenous Q6H PRN Russella Dar, NP      . lactated ringers infusion   Intravenous Continuous Marcene Duos, MD      . loratadine (CLARITIN) tablet 10 mg  10 mg Oral QPM Ozella Rocks, MD   10 mg at 07/04/15 1805  . magnesium citrate solution 1 Bottle  1 Bottle Oral Once PRN Russella Dar, NP      . metoCLOPramide (REGLAN) tablet 5-10 mg  5-10 mg Oral Q8H PRN Allena Katz, PA-C       Or  . metoCLOPramide (REGLAN) injection 5-10 mg  5-10 mg Intravenous Q8H PRN Allena Katz, PA-C      . montelukast (SINGULAIR) tablet 10 mg  10 mg Oral QHS Russella Dar, NP   10 mg at 07/04/15 2305  . morphine 2 MG/ML injection 1 mg  1 mg Intravenous Q2H PRN Russella Dar, NP   1 mg at 07/03/15 2155  . mupirocin ointment (BACTROBAN) 2 % 1 application  1 application Nasal BID Maretta Bees, MD   1 application at 07/04/15 2306  . ondansetron (ZOFRAN) tablet 4 mg  4 mg Oral Q6H PRN Russella Dar, NP       Or  . ondansetron [facility]) injection 4 mg  4 mg Intravenous Q6H PRN Russella Dar, NP      . ondansetron [facility]) tablet 4 mg  4 mg Oral Q6H PRN Allena Katz, PA-C       Or  . ondansetron [facility]) injection 4 mg  4 mg Intravenous Q6H PRN Allena Katz, PA-C      . oxyCODONE (Oxy IR/ROXICODONE) immediate release tablet 5-10 mg  5-10 mg Oral Q4H PRN Russella Dar, NP   10 mg at 07/05/15 0841  . pantoprazole (PROTONIX) EC tablet 20 mg  20 mg Oral Daily Russella Dar, NP   20 mg at 07/04/15 0931  . PHENobarbital (LUMINAL) tablet 129.6 mg  129.6 mg Oral QHS Russella Dar, NP   129.6 mg at 07/04/15 2305  . polyethylene glycol (MIRALAX / GLYCOLAX) packet 17 g  17 g Oral Daily PRN Russella Dar, NP      . potassium chloride SA (K-DUR,KLOR-CON) CR tablet 10 mEq  10 mEq Oral BID Russella Dar, NP   10 mEq at  07/04/15 2305  . pramipexole (MIRAPEX) tablet 0.5 mg  0.5 mg Oral Daily Ozella Rocks, MD   0.5 mg at  07/04/15 0930   And  . pramipexole (MIRAPEX) tablet 0.75 mg  0.75 mg Oral Q2000 Ozella Rocks, MD   0.75 mg at 07/04/15 2028  . rosuvastatin (CRESTOR) tablet 20 mg  20 mg Oral Daily Russella Dar, NP   20 mg at 07/04/15 0931  . sodium chloride flush (NS) 0.9 % injection 3 mL  3 mL Intravenous Q12H Russella Dar, NP   3 mL at 07/04/15 2307  . venlafaxine XR (EFFEXOR-XR) 24 hr capsule 75 mg  75 mg Oral Q breakfast Russella Dar, NP   75 mg at 07/05/15 1610     Discharge Medications: Please see discharge summary for a list of discharge medications.  Relevant Imaging Results:  Relevant Lab Results:   Additional Information SS#: [id number]  Loleta Dicker, LCSW

## 2015-07-06 LAB — GLUCOSE, CAPILLARY
GLUCOSE-CAPILLARY: 105 mg/dL — AB (ref 65–99)
GLUCOSE-CAPILLARY: 117 mg/dL — AB (ref 65–99)

## 2015-07-06 MED ORDER — VENLAFAXINE HCL ER 75 MG PO CP24
ORAL_CAPSULE | ORAL | Status: DC
Start: 2015-07-06 — End: 2015-07-25

## 2015-07-06 MED ORDER — ASPIRIN EC 325 MG PO TBEC: 325.0000 mg | DELAYED_RELEASE_TABLET | Freq: Two times a day (BID) | ORAL | Status: DC

## 2015-07-06 MED ORDER — ROSUVASTATIN CALCIUM 20 MG PO TABS
20.0000 mg | ORAL_TABLET | Freq: Every day | ORAL | Status: DC
Start: 2015-07-06 — End: 2015-10-21

## 2015-07-06 MED ORDER — POLYETHYLENE GLYCOL 3350 17 G PO PACK: 17.0000 g | PACK | Freq: Every day | ORAL | Status: DC | PRN

## 2015-07-06 MED ORDER — OXYCODONE-ACETAMINOPHEN 5-325 MG PO TABS: 1.0000 | ORAL_TABLET | ORAL | Status: DC | PRN

## 2015-07-06 MED ORDER — AMLODIPINE BESYLATE 10 MG PO TABS: 10.0000 mg | ORAL_TABLET | Freq: Every day | ORAL | Status: DC

## 2015-07-06 MED ORDER — FUROSEMIDE 20 MG PO TABS
20.0000 mg | ORAL_TABLET | Freq: Every day | ORAL | Status: DC
Start: 2015-07-06 — End: 2015-07-06

## 2015-07-06 NOTE — Care Management Note (Signed)
Case Management Note  Patient Details  Name: [name] MRN: [id number] Date of Birth: 07/09/1950  Subjective/Objective:                  Syncope/ankle fx Action/Plan: dsicharge planning Expected Discharge Date:  07/06/15               Expected Discharge Plan:  Home w Home Health Services  In-House Referral:     Discharge planning Services  CM Consult  Post Acute Care Choice:    Choice offered to:  Patient  DME Arranged:  N/A DME Agency:  NA  HH Arranged:  PT, OT HH Agency:  [facility]  Status of Service:  Completed, signed off  Medicare Important Message Given:    Date Medicare IM Given:    Medicare IM give by:    Date Additional Medicare IM Given:    Additional Medicare Important Message give by:     If discussed at Long Length of Stay Meetings, dates discussed:    Additional Comments: Cm received call from RN stating pt decided not to go to SNF and now wants to go home.  CM met with pt and spouse of pt who both adamantly refuse going to SNF.  Pt states she has a wheelchair at home, rolling walker and 3n1.  Per PT pt is TTWB status.  Pt and spouse states they are comfortable transporting home, getting into home with wheelchair they have at home, and transferring to and from bed-chair-3n1.  CM offered choice of home health agency.  Pt chooses [facility] to render HHPT/OT/SW.  Referral called to [facility] rep, Tiffany.  No other CM needs were communicated. ,  Christine, RN 07/06/2015, 2:56 PM  

## 2015-07-06 NOTE — Discharge Summary (Addendum)
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 Parasternal Heave +ve B.Sounds, Abd Soft, Non tender, No organomegaly appriciated, No rebound -guarding or rigidity. No Cyanosis, Clubbing or edema, No new Rash or bruise  DISCHARGE CONDITION: Stable  DISPOSITION: SNF  DISCHARGE INSTRUCTIONS:    Activity:  See below  Get Medicines reviewed and adjusted: Please take all your medications with you for your next visit with your Primary MD  Please request your Primary MD to go over all hospital tests and procedure/radiological results at the follow up, please ask your Primary MD to get all Hospital records sent to his/her office.  If you experience worsening of your admission symptoms, develop shortness of breath, life threatening emergency, suicidal or homicidal thoughts you must seek medical attention immediately by calling 911 or calling your MD immediately  if symptoms less severe.  You must read complete instructions/literature along with all the possible adverse reactions/side effects for all the Medicines you take and that have been prescribed to you. Take any new Medicines after you have  completely understood and accpet all the possible adverse reactions/side effects.   Do not drive when taking Pain medications.   Do not take more than prescribed Pain, Sleep and Anxiety  Medications  Special Instructions: If you have smoked or chewed Tobacco  in the last 2 yrs please stop smoking, stop any regular Alcohol  and or any Recreational drug use.  Wear Seat belts while driving.  Please note  You were cared for by a hospitalist during your hospital stay. Once you are discharged, your primary care physician will handle any further medical issues. Please note that NO REFILLS for any discharge medications will be authorized once you are discharged, as it is imperative that you return to your primary care physician (or establish a relationship with a primary care physician if you do not have one) for your aftercare needs so that they can reassess your need for medications and monitor your lab values.   Diet recommendation: Heart Healthy diet  Discharge Instructions    Ambulatory referral to Neurology    Complete by:  As directed   An appointment is requested in approximately: 1 week     Diet - low sodium heart healthy    Complete by:  As directed      Diet - low sodium heart healthy    Complete by:  As directed      Discharge instructions    Complete by:  As directed   Please do not drive, operate heavy machinery, participate in activities at heights or participate in high speed sports until you have seen by Primary MD or a Neurologist and advised to do so again.     Partial weight bearing    Complete by:  As directed   % Body Weight:  Touch toe  Laterality:  right  Extremity:  Lower           Follow-up Information    Follow up with ROWAN,FRANK J, MD In 10 days.   Specialty:  [facility]   Why:  from date of surgery   Contact information:   [address] [place] [address] [phone]       Follow up with Emeterio Reeve, MD. Schedule an appointment as  soon as possible for a visit in 1 week.   Specialty:  Family Medicine   Why:  Hospital follow up   Contact information:   [address] [place] [address] [phone]       Follow up with [facility], MD. Schedule an appointment as soon as possible for a visit in 1 week.   Specialty:  Neurology   Why:  Hospital follow up   Contact information:   [address] [place] [address] [phone]       Follow up with [facility].   Why:  home health physical and occupational therapy and social worker, nurse and aide   Contact information:   [address] [place] [address] [phone]       Follow up with [facility]. Schedule an appointment as soon as possible for a visit in 2 weeks.   Specialty:  Cardiology   Why:  Hospital follow up for evaluation of syncope   Contact information:   [address] [place] [address] [phone]      Total Time spent on discharge equals 45 minutes.  SignedJeoffrey Massed 07/06/2015 3:30 PM

## 2015-07-06 NOTE — Progress Notes (Addendum)
PATIENT ID: [name]  MRN: [id number]  DOB/AGE:  Jul 15, 1950 / 65 y.o.  3 Days Post-Op Procedure(s) (LRB): OPEN REDUCTION INTERNAL FIXATION (ORIF) ANKLE FRACTURE (Right)    PROGRESS NOTE Subjective:  Patient is alert, oriented, taking PO Denies SOB, Chest or Calf Pain. Using Incentive Spirometer, PAS in place. Has been able to go bed-bedside commode, TDWB R LE   Objective: Vital signs in last 24 hours: Temp:  [97.5 F (36.4 C)-99.1 F (37.3 C)] 97.5 F (36.4 C) (02/25 0426) Pulse Rate:  [70-78] 70 (02/25 0426) Resp:  [18-19] 19 (02/25 0426) BP: (107-135)/(50-56) 107/50 mmHg (02/25 0426) SpO2:  [94 %-95 %] 95 % (02/25 0426) Weight:  [57.743 kg (127 lb 4.8 oz)] 57.743 kg (127 lb 4.8 oz) (02/25 0500)    Intake/Output from previous day: I/O last 3 completed shifts: In: 963 [P.O.:960; I.V.:3] Out: 2800 [Urine:2800]   Intake/Output this shift: Total I/O In: 222 [P.O.:222] Out: 800 [Urine:800]   LABORATORY DATA:  Recent Labs  07/05/15 0738 07/06/15 0430 07/06/15 0718  GLUCAP 121* 105* 117*    Examination: Neurologically intact Neurovascular intact Sensation intact distally} Pt able to wiggle toes with out issue, intact LT P/D/1web Assessment:   3 Days Post-Op Procedure(s) (LRB): OPEN REDUCTION INTERNAL FIXATION (ORIF) ANKLE FRACTURE (Right) ADDITIONAL DIAGNOSIS:   Plan:  Partial Weight Bearing @ toe touch % (PWB)  DVT Prophylaxis:  Aspirin and SCD  DISCHARGE PLAN: Skilled Nursing Facility/Rehab, pt will need stay at rehab facility as she is unable to support self and use walker.  DISCHARGE NEEDS: Walker and 3-in-1 comode seat  My understanding patient being d/c to SNF/Rehab facility today.    Estuardo Frisbee A. 07/06/2015, 9:18 AM

## 2015-07-06 NOTE — Discharge Instructions (Signed)
TDWB R LE in boot

## 2015-07-06 NOTE — Clinical Social Work Note (Signed)
Clinical Social Worker received notification from [facility] that patient is now adamantly refusing SNF placement and plans to return home with family.  RNCM is arranging home health and family to provide transport.  CSW notified Lacinda Axon of patient change in discharge plans.  Clinical Social Worker will sign off for now as social work intervention is no longer needed. Please consult [place] again if new need arises.  Macario Golds, [place] [phone]

## 2015-07-25 ENCOUNTER — Ambulatory Visit (INDEPENDENT_AMBULATORY_CARE_PROVIDER_SITE_OTHER): Payer: BC Managed Care – PPO | Admitting: Cardiology

## 2015-07-25 ENCOUNTER — Ambulatory Visit (INDEPENDENT_AMBULATORY_CARE_PROVIDER_SITE_OTHER): Payer: BC Managed Care – PPO

## 2015-07-25 ENCOUNTER — Encounter: Payer: Self-pay | Admitting: Cardiology

## 2015-07-25 VITALS — BP 140/64 | HR 70 | Ht 62.0 in | Wt 271.1 lb

## 2015-07-25 DIAGNOSIS — R55 Syncope and collapse: Secondary | ICD-10-CM | POA: Diagnosis not present

## 2015-07-25 DIAGNOSIS — R600 Localized edema: Secondary | ICD-10-CM | POA: Diagnosis not present

## 2015-07-25 DIAGNOSIS — E785 Hyperlipidemia, unspecified: Secondary | ICD-10-CM

## 2015-07-25 DIAGNOSIS — I10 Essential (primary) hypertension: Secondary | ICD-10-CM | POA: Diagnosis not present

## 2015-07-25 NOTE — Patient Instructions (Signed)
Your physician has recommended that you wear an event monitor. Event monitors are medical devices that record the heart's electrical activity. Doctors most often us these monitors to diagnose arrhythmias. Arrhythmias are problems with the speed or rhythm of the heartbeat. The monitor is a small, portable device. You can wear one while you do your normal daily activities. This is usually used to diagnose what is causing palpitations/syncope (passing out).  Dr Herbie BaltimoreHarding recommends that you schedule a follow-up appointment in 6-8 weeks.  If you need a refill on your cardiac medications before your next appointment, please call your pharmacy.  Your Doctor has ordered you to wear a heart monitor. You will wear this for 30 days.  TIPS -  REMINDERS 1. The sensor is the lanyard that is worn around your neck every day - this is powered by a battery that needs to be changed every day 2. The monitor is the device that allows you to record symptoms - this will need to be charged daily 3. The sensor & monitor need to be within 100 feet of each other at all times 4. The sensor connects to the electrodes (stickers) - these should be changed every 24-48 hours (you do not have to remove them when you bathe, just make sure they are dry when you connect it back to the sensor 5. If you need more supplies (electrodes, batteries), please call the 1-800 # on the back of the pamphlet and [employer] will mail you more supplies 6. If your skin becomes sensitive, please try the sample pack of sensitive skin electrodes (the white packet in your silver box) and call [employer] to have them mail you more of these type of electrodes 7. When you are finish wearing the monitor, please place all supplies back in the silver box, place the silver box in the pre-packaged UPS bag and drop off at [employer] or call them so they can come pick it up   Cardiac Event Monitoring A cardiac event monitor is a small recording device used to help detect  abnormal heart rhythms (arrhythmias). The monitor is used to record heart rhythm when noticeable symptoms such as the following occur:  Fast heartbeats (palpitations), such as heart racing or fluttering.  Dizziness.  Fainting or light-headedness.  Unexplained weakness. The monitor is wired to two electrodes placed on your chest. Electrodes are flat, sticky disks that attach to your skin. The monitor can be worn for up to 30 days. You will wear the monitor at all times, except when bathing.  HOW TO USE YOUR CARDIAC EVENT MONITOR A technician will prepare your chest for the electrode placement. The technician will show you how to place the electrodes, how to work the monitor, and how to replace the batteries. Take time to practice using the monitor before you leave the office. Make sure you understand how to send the information from the monitor to your health care provider. This requires a telephone with a landline, not a cell phone. You need to:  Wear your monitor at all times, except when you are in water:  Do not get the monitor wet.  Take the monitor off when bathing. Do not swim or use a hot tub with it on.  Keep your skin clean. Do not put body lotion or moisturizer on your chest.  Change the electrodes daily or any time they stop sticking to your skin. You might need to use tape to keep them on.  It is possible that your skin under  the electrodes could become irritated. To keep this from happening, try to put the electrodes in slightly different places on your chest. However, they must remain in the area under your left breast and in the upper right section of your chest.  Make sure the monitor is safely clipped to your clothing or in a location close to your body that your health care provider recommends.  Press the button to record when you feel symptoms of heart trouble, such as dizziness, weakness, light-headedness, palpitations, thumping, shortness of breath, unexplained  weakness, or a fluttering or racing heart. The monitor is always on and records what happened slightly before you pressed the button, so do not worry about being too late to get good information.  Keep a diary of your activities, such as walking, doing chores, and taking medicine. It is especially important to note what you were doing when you pushed the button to record your symptoms. This will help your health care provider determine what might be contributing to your symptoms. The information stored in your monitor will be reviewed by your health care provider alongside your diary entries.  Send the recorded information as recommended by your health care provider. It is important to understand that it will take some time for your health care provider to process the results.  Change the batteries as recommended by your health care provider. SEEK IMMEDIATE MEDICAL CARE IF:   You have chest pain.  You have extreme difficulty breathing or shortness of breath.  You develop a very fast heartbeat that persists.  You develop dizziness that does not go away.  You faint or constantly feel you are about to faint. Document Released: 02/04/2008 Document Revised: 09/11/2013 Document Reviewed: 10/24/2012 [facility] Patient Information 2015 [place], [place]. This information is not intended to replace advice given to you by your health care provider. Make sure you discuss any questions you have with your health care provider.
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
## 2015-07-28 ENCOUNTER — Encounter: Payer: Self-pay | Admitting: Cardiology

## 2015-07-28 NOTE — Assessment & Plan Note (Signed)
The patient understands the need to lose weight with diet and exercise. We have discussed specific strategies for this.  

## 2015-07-28 NOTE — Assessment & Plan Note (Signed)
On statin - monitored by PCP 

## 2015-07-28 NOTE — Assessment & Plan Note (Signed)
Currently only on spironolactone but doesn't seem to be helping very well. We need to make sure we know what her potassium level is prior to considering adding Lasix based on her recent syncope. She is currently taking potassium supplement plus spironolactone. We should be okay considering the addition of Lasix, but I would like to see her labs. Addressed this and follow-up.

## 2015-07-28 NOTE — Assessment & Plan Note (Signed)
Relatively well-controlled blood pressure on amlodipine and spironolactone. Will defer to primary physician as this is not a major issue. It would appear however that she has adequate blood pressure and is not potentially hypotensive. At this point I would prefer to allow for some mild permissive hypertension.

## 2015-07-28 NOTE — Assessment & Plan Note (Signed)
So far no clear etiology is been identified. It doesn't sound like there was any R regions are just this was a seizure although she has had a history of seizures. This is being followed by neurology. As for cardiac etiology, her echo was essentially in normal with some basal hypertrophy and diastolic dysfunction. Perhaps a few dehydrated she could've had an arrhythmia, but denies any sensation of palpitations. Usually it. Go fast enough to pass out, he would have some prodrome. She could have bradycardia, but is not on any agents to make her go slow and has never had any antecedent symptoms.  Only passing mention of UTI was noted in the hospital records, however if she had a significant UTI, syncope can simply been related to her infection and not cardiac or neurologic in nature. This could be related to acute illness.  In order to exclude any potential arrhythmia, we would need to have a monitor on her. She'll to 48 hour monitor versus one month monitor think in order to try to capture something we would prefer to do a full month monitor. If not able to catch an episode, doesn't mean that this wasn't an arrhythmia. If there is a recurrent episode, then we can consider loop recorder.

## 2015-07-30 ENCOUNTER — Encounter: Payer: Self-pay | Admitting: Neurology

## 2015-07-30 ENCOUNTER — Ambulatory Visit (INDEPENDENT_AMBULATORY_CARE_PROVIDER_SITE_OTHER): Payer: BC Managed Care – PPO | Admitting: Neurology

## 2015-07-30 VITALS — BP 132/64 | HR 66 | Resp 20

## 2015-07-30 DIAGNOSIS — G40209 Localization-related (focal) (partial) symptomatic epilepsy and epileptic syndromes with complex partial seizures, not intractable, without status epilepticus: Secondary | ICD-10-CM

## 2015-07-30 MED ORDER — GABAPENTIN ENACARBIL ER 600 MG PO TBCR: EXTENDED_RELEASE_TABLET | ORAL | Status: DC

## 2015-07-30 MED ORDER — PHENOBARBITAL 64.8 MG PO TABS: ORAL_TABLET | ORAL | Status: DC

## 2015-07-30 NOTE — Progress Notes (Signed)
PATIENT: [name] DOB: 08/22/1950  REASON FOR VISIT: routine follow up for CPAP, RLS HISTORY FROM: patient  HISTORY OF PRESENT ILLNESS:   07-30-2015, [name] is a 65 y.o. female Is seen here as a referral/ revisit from Dr Herbie Baltimore, for syncope evaluation.  Usually seen for Dr. Paulino Rily for CPAP compliance.   [name] is here today following a recent admission to [facility] on the 21th through 25th of February 2017. She passed out also she is not sure how twisted and fractured her right foot, she suffered a tri-malleolar ankle fracture also known as a Weber, she needed surgery-  including a titanium plate and multiple screws and was served by Dr. Carlean Jews in orthopedics. A cardiology workup way to work with his work. Cardiac monitor initiated which includes a currently still worn 30 day monitor, thus far cardiac workup has been negative.  EKGs, electrolyte testing blood tests and toxicology screens were all negative, RBC was low the patient is anemic according to her blood tests from 07/12/2015 but she had normal liver function tests, kidney function she is recommended to take some vitamin D as hers is borderline low. Her EKG was normal, her EEG in hospital was normal.  The patient has a single seizure 30 plus years ago after surgery, was placed on phenobarbital, and attempt to wean her  ended up with a second seizure.  I usually follow this patient for her CPAP compliance but today we will order a prolonged EEG for this patient. I will ask for a 40 minute recording, non-sleep deprived EEG.    Interval history:  10-25-14 [name] is here today for her yearly revisit, the great pleasure to see her in good health she endorsed the Epworth sleepiness score at 10 points and she brought me a download of her CPAP machine. This one is dated 10-24-14 and is a 30 day encompassing report she has a 97% compliance for 29 out of 30 days of use over 4 hours the average daily use is 4  hours 11 minutes. Her residual AHI is 1.5 on the current set at 12 cm setting. She does have a C-Flex setting of 2 cm water. She needs a new nasal liner.   Epworth Sleepiness Scale at 8 points, on  10-2014, which was 11 points previously and fatigue severity scale of 28,  which was 33 points previously , and the depression score at one point, unchanged.  The durable medical equipment company is [employer] followed by [employer].  Her AHI on her sleep study was 73 and her AHI noted on the download was 1.9 per hour, over 30 days the compliance range was 96.7% at an average daily therapy time on CPAP at 12 cm water-  Was 4 hours and 26 minutes. The patient is compliant by CMS criteria, downloaded 10/25/13. She states she doing well with her cpap and now her husband is on cpap too.  RLS and neuropathy is well controlled .  OSA patient is compliant with CPAP at 12 cm water, we discussed EPR for her, I would like for her to try 2 cm flex.  Urged patient to wear cpap during daytime naps too.  Prior HPI: Dr. Sandria Manly had referred [name] for a sleep study on 11-19-2011. I had seen her for a sleep consult on 12-07-11 .  He felt that he was likely suffering from sleep apnea based on her excessive daytime sleepiness and body mass index. The patient has a  history of obesity, hypertension, cerebral aneurysm bleed, asthma, reflux disease a history of seizures and osteoarthritis. More sleep related for her complains of snoring, restless legs, witnessed apnea, disruptive snoring daytime napping .  Sleep study took place 11-19-11 :  She had endorsed the Epworth sleepiness score at 16 points and the Beck'sat 10 points , her BMI at the time of the study was 43.2.  The patient tested positive for severe, obstructive sleep apnea. Her AHI was 73 and her RDI was 74.8/hr. The REM AHI was only 57 , the supine AHI was 73 /hr. and the lowest oxygen saturation at nadir was 65%.The patient was titrated to 12 cm water pressure and  slept 103 minutes but the setting, of which 45 minutes were REM sleep.  The AHI was now reduced to 1.2. She was referred to [employer] [facility] and has followed regularly with PSG and R , Boykin PeekJames [name].  The last download for this patient showed an 85% compliance over 180 days the residual AHI of 2.6 and therapy time per night at 5 hours and 26 minutes. The setting is still at 12 cm water.  The sleepiness score is 8 points much reduced in comparison to previous study time.  The patient has reported that she noticed a big difference in her level of alertness and in the quality of her sleep. She also endorsed a geriatric depression scale today at one point.  Her fatigue severity score is 20 points, with normal limits.  The patient reports that she is going to bed between 10 and 11 PM she arises in the morning normally around 7 and 8 AM.  She gets about 8 hours of sleep and feels restored in the morning. She does not nap in daytime only rarely of weekends .She no longer snores on CPAP , goes to the bathroom more than once at night, no witnessed apnea, and no morning headaches. No longer has the severe incapacitating headaches she used to have . She feels stiff in the morning, but less than before she went through PT for her arthritis and scoliosis. Some nights she recalls her dreams. These ar not nightmarish or even vivid. No sleep walking or sleep talking.  She measured today at 248 pounds and has for the gained weight since her last visit her BMI has now reached 45. She does not feel that her appetite has increased but that her metabolism has slowed.  She normally eats 2 meals a day, she has eliminated sodas and soft ramus and father brings water during daytime.  The patient stated she has the stamina to exercise, but she has noted that hot flashes reminiscent of menopausal symptoms during exercises, which make exercise uncomfortable.  Her past history under Dr Imagene GurneyLove's care is noted in detail on 12-07-11 visit , sleep  consult .  Right frontal craniotomy 1986 Dr. Jeral FruitBotero for multiple aneurysmata, right MCA trifurcation clipping. Left frontal craniotomy July 86, left distal ICA clipping, 5 days after surgery she suffered a seizure. Another 3 years later when trying to wean off phenobarbital, had a recurrent 15 by 25 mm aneurysm in the right ICA. Anterior-choroidal aneurysm, treated by Dr Corliss Skainseveshwar 2007.   REVIEW OF SYSTEMS: Full 14 system review of systems performed and notable only for:  Epworth score of 11 points, some restless legs, some diarrhea, some back pain, bladder incontinence, urinary urgency, headaches , no tobacco, ETOH or recreational drug use.  Dry mouth after Detrol. No nocturia , but hourly urination in daytime.  ALLERGIES: Allergies  Allergen Reactions  . Compazine Shortness Of Breath  . Prochlorperazine Maleate   . Topamax [Topiramate]   . Codeine Sulfate Nausea Only  . Adhesive [Tape] Rash  . Iodinated Diagnostic Agents Rash    HOME MEDICATIONS: Outpatient Prescriptions Prior to Visit  Medication Sig Dispense Refill  . albuterol (PROVENTIL HFA;VENTOLIN HFA) 108 (90 BASE) MCG/ACT inhaler Inhale 2 puffs into the lungs every 4 (four) hours as needed for wheezing.     [name]. amLODipine (NORVASC) 10 MG tablet Take 10 mg by mouth daily.    [name]. aspirin EC 325 MG tablet Take 1 tablet (325 mg total) by mouth 2 (two) times daily. For 2 weeks and then resume ASA 81 mg daily    . Biotin (BIOTIN MAXIMUM STRENGTH) 10 MG TABS Take 10 mg by mouth 2 (two) times daily.    . Calcium Carbonate (CALCIUM 600 PO) Take 1 tablet by mouth 2 (two) times daily.    . cholecalciferol (VITAMIN D) 1000 units tablet Take 4,000 Units by mouth daily.    . ferrous sulfate 325 (65 FE) MG tablet Take 325 mg by mouth 2 (two) times daily with a meal.    . fluticasone (FLONASE) 50 MCG/ACT nasal spray Place 2 sprays into the nose 2 (two) times daily.    . Glucosamine HCl (GLUCOSAMINE PO) Take 1 capsule by mouth 2 (two) times  daily.    [name]. HORIZANT 600 MG TBCR TAKE 1&1/2 TABLETS BY MOUTH EVERY DAY 135 tablet 1  . levocetirizine (XYZAL) 5 MG tablet Take 5 mg by mouth every evening.     . meloxicam (MOBIC) 15 MG tablet Take 15 mg by mouth daily.    . Multiple Vitamin (MULTIVITAMIN) capsule Take 1 capsule by mouth daily.    . NON FORMULARY CPAP in the evenings    . oxyCODONE-acetaminophen (ROXICET) 5-325 MG tablet Take 1 tablet by mouth every 4 (four) hours as needed for severe pain.  0  . pantoprazole (PROTONIX) 40 MG tablet Take 40 mg by mouth daily.    [name]. PHENobarbital (LUMINAL) 64.8 MG tablet TAKE 2 TABLETS BY MOUTH EVERY DAY AT BEDTIME 180 tablet 1  . potassium chloride (K-DUR) 10 MEQ tablet Take 10 mEq by mouth 2 (two) times daily.    . pramipexole (MIRAPEX) 0.25 MG tablet Take 0.5 mg by mouth daily.    . Pramipexole Dihydrochloride 0.75 MG TB24 Take 1 tablet by mouth at bedtime. TAKE 1 TAB BY MOUTH IN THE EVENING    . rosuvastatin (CRESTOR) 20 MG tablet Take 1 tablet (20 mg total) by mouth daily. Reported on 07/02/2015    . spironolactone (ALDACTONE) 25 MG tablet Take 25 mg by mouth daily.     No facility-administered medications prior to visit.     PHYSICAL EXAM  Filed Vitals:   07/30/15 1355  BP: 132/64  Pulse: 66  Resp: 20   There is no weight on file to calculate BMI. No exam data present   Generalized: Well developed, in no acute distress, obese Caucasian female Head: Normocephalic, atraumatic.  Neck is supple. Mallampati 3, neck circumference: 18  Cardiac: Regular rate rhythm, no murmur  Musculoskeletal: No deformity   Neurological examination  Mentation: Alert oriented to time, place, history taking. Follows all commands speech and language fluent Cranial nerve II-XII: Fundoscopic exam not done.  Pupils were equal round reactive to light extraocular movements were full, visual field were full on confrontational test. Facial sensation and strength were normal. hearing was intact  to finger  rubbing bilaterally. Uvula tongue midline. head turning and shoulder shrug and were normal and symmetric. Tongue protrusion into cheek strength was normal. Motor: The motor testing reveals 5 over 5 strength of all 4 extremities. Good symmetric motor tone is noted throughout.  Sensory: Sensory testing is intact to soft touch on all 4 extremities. No evidence of extinction is noted.  Coordination: Cerebellar testing reveals good finger-nose-finger and heel-to-shin bilaterally.  Gait and station: right foot in a boot. fractured heel, titanium plate/  Reflexes: Deep tendon reflexes are symmetric and normal bilaterally.   ASSESSMENT AND PLAN 65 y.o. year old female  ,  0) syncope ?  given that this established patient in my sleep clinic now suffered a loss of awareness-loss of consciousness, it would be beneficial to evaluate her for seizures she does have a remote history of seizures but was well controlled on classic medications of the 1970s, phenobarbital and Dilantin. I will order a prolonged EEG 40 minutes plus.  1) OSA well controlled.  2) urinary urge and nocturia, improved.  3) obesity: exercise and diet discussed .  4 ) RLS on Mirapex.  5) Horizant for neuropathy.   Plan: RV in 2 month  with me, Dr. Vickey Huger, or NP . The next visit will be dedicated to seizure evaluation.  She is due to see me with CPAP as well by May 2017 .  [name]   Wendelin Reader, MD  07/30/2015, 2:10 PM [facility] [address], [address] [place], [place] [address] [phone]

## 2015-07-31 ENCOUNTER — Telehealth: Payer: Self-pay

## 2015-07-31 NOTE — Telephone Encounter (Signed)
I am unsure of where pt wants her RX for phenobarbital sent. [facility] I just want to verify that this is where she wants it sent.  I left a message on pt's home and cell number asking her to call me back.

## 2015-07-31 NOTE — Telephone Encounter (Signed)
Patient returned Kristen's call, Rx for phenobarbital and Rx for medicine that was going to be substituted for Tramadol (patient can't remember the name of it) needs to be sent to [facility] on [address]. Patient states, she is dealing with a broken ankle right now and it takes her a while to get to the phone.

## 2015-07-31 NOTE — Telephone Encounter (Signed)
RX for phenobarbital was sent to [facility] on [address], as well as the horizant which was e-scribed yesterday by Dr. Vickey Hugerohmeier as well. These are the only 2 prescriptions that Dr. Vickey Hugerohmeier has prescribed and both were [facility] to [facility] on [address]. Received a receipt of confirmation for the PB.

## 2015-08-04 ENCOUNTER — Encounter: Payer: Self-pay | Admitting: *Deleted

## 2015-08-05 ENCOUNTER — Telehealth: Payer: Self-pay | Admitting: Cardiology

## 2015-08-05 NOTE — Telephone Encounter (Signed)
Pt calling in stating that she is ready to move forward the the Lasix regime that was discussed in the last office visit. She will like to begin this as soon as possible because she is having a lot of swelling in her legs. Please f/u with her

## 2015-08-05 NOTE — Telephone Encounter (Signed)
I returned pt call. Pt c/o gradually increasing swelling in LE's since seen last. (07/25/15) Dr. Herbie BaltimoreHarding had recommended lasix, pending labwork.  Offered to order BMET for patient. Pt informs me she is [facility] send copies of her recent labwork done at that office, which includes BMET. I ensured she had fax number for our office.  She understands we will call back w/ recommendations once Dr. Herbie BaltimoreHarding can review. I instructed to call if swelling worse, or increased fatigue or SOB.

## 2015-08-08 MED ORDER — FUROSEMIDE 20 MG PO TABS: 20.0000 mg | ORAL_TABLET | Freq: Every day | ORAL | Status: DC

## 2015-08-08 NOTE — Telephone Encounter (Signed)
Rx sent to preferred pharmacy.  Recommendations for new medication given, pt advised to call if any concerns or no improvement of symptoms. She voiced understanding.  Patient also informs me that her [facility] will be [facility] which she needs [facility] out for medical clearance. Informed her I will let Jasmine DecemberSharon know to look for this & Dr. Herbie BaltimoreHarding can fill out when next in office.

## 2015-08-08 NOTE — Telephone Encounter (Signed)
Can start Lasix @ 20 mg daily - would use 40 mg for 1st 2 days.  Marykay LexHARDING, DAVID W, MD

## 2015-08-12 ENCOUNTER — Ambulatory Visit (INDEPENDENT_AMBULATORY_CARE_PROVIDER_SITE_OTHER): Payer: BC Managed Care – PPO | Admitting: Neurology

## 2015-08-12 DIAGNOSIS — R4020 Unspecified coma: Secondary | ICD-10-CM

## 2015-08-12 DIAGNOSIS — G40209 Localization-related (focal) (partial) symptomatic epilepsy and epileptic syndromes with complex partial seizures, not intractable, without status epilepticus: Secondary | ICD-10-CM | POA: Diagnosis not present

## 2015-08-15 ENCOUNTER — Other Ambulatory Visit: Payer: Self-pay | Admitting: Neurology

## 2015-08-15 DIAGNOSIS — R4020 Unspecified coma: Secondary | ICD-10-CM | POA: Insufficient documentation
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
## 2015-08-15 NOTE — Progress Notes (Signed)
Spoke to pt and advised her that her EEG was normal with some background slowing. Pt knows to follow up with Dr. Vickey Hugerohmeier in May. Pt verbalized understanding of results and had no questions at this time.

## 2015-09-07 ENCOUNTER — Other Ambulatory Visit: Payer: Self-pay | Admitting: Neurology

## 2015-09-09 ENCOUNTER — Encounter: Payer: Self-pay | Admitting: Cardiology

## 2015-09-09 ENCOUNTER — Ambulatory Visit (INDEPENDENT_AMBULATORY_CARE_PROVIDER_SITE_OTHER): Payer: BC Managed Care – PPO | Admitting: Cardiology

## 2015-09-09 VITALS — BP 166/79 | HR 86 | Ht 62.0 in | Wt 280.2 lb

## 2015-09-09 DIAGNOSIS — R55 Syncope and collapse: Secondary | ICD-10-CM | POA: Diagnosis not present

## 2015-09-09 DIAGNOSIS — R600 Localized edema: Secondary | ICD-10-CM | POA: Diagnosis not present

## 2015-09-09 DIAGNOSIS — E785 Hyperlipidemia, unspecified: Secondary | ICD-10-CM

## 2015-09-09 DIAGNOSIS — I10 Essential (primary) hypertension: Secondary | ICD-10-CM

## 2015-09-09 MED ORDER — SPIRONOLACTONE 25 MG PO TABS: 25.0000 mg | ORAL_TABLET | Freq: Every day | ORAL | Status: DC

## 2015-09-09 MED ORDER — FUROSEMIDE 40 MG PO TABS: 40.0000 mg | ORAL_TABLET | Freq: Every day | ORAL | Status: DC
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
## 2015-09-09 NOTE — Patient Instructions (Addendum)
RESTART SPIROLACTONE 25 MG ONE TABLET DAILY  START LASIX ( FUROSEMIDE ) 40 MG ONE TABLET DAILY  Your physician recommends that you schedule a follow-up appointment in 6-7 WEEKS BLOOD PRESSURE CLINIC-- B/P ,AND HEART FAILURE  Your physician wants you to follow-up in 4 MONTHS WITH DR HARDING. You will receive a reminder letter in the mail two months in advance. If you don't receive a letter, please call our office to schedule the follow-up appointment.  If you need a refill on your cardiac medications before your next appointment, please call your pharmacy.

## 2015-09-10 ENCOUNTER — Encounter: Payer: Self-pay | Admitting: Neurology

## 2015-09-10 ENCOUNTER — Ambulatory Visit (INDEPENDENT_AMBULATORY_CARE_PROVIDER_SITE_OTHER): Payer: BC Managed Care – PPO | Admitting: Neurology

## 2015-09-10 VITALS — BP 152/80 | HR 86 | Resp 20 | Ht 62.0 in | Wt 280.0 lb

## 2015-09-10 DIAGNOSIS — G2581 Restless legs syndrome: Secondary | ICD-10-CM | POA: Diagnosis not present

## 2015-09-10 DIAGNOSIS — G4733 Obstructive sleep apnea (adult) (pediatric): Secondary | ICD-10-CM | POA: Diagnosis not present

## 2015-09-10 DIAGNOSIS — R609 Edema, unspecified: Secondary | ICD-10-CM

## 2015-09-10 DIAGNOSIS — Z9989 Dependence on other enabling machines and devices: Secondary | ICD-10-CM

## 2015-09-10 DIAGNOSIS — R351 Nocturia: Secondary | ICD-10-CM

## 2015-09-10 DIAGNOSIS — M25473 Effusion, unspecified ankle: Secondary | ICD-10-CM | POA: Insufficient documentation

## 2015-09-10 MED ORDER — PRAMIPEXOLE DIHYDROCHLORIDE 0.25 MG PO TABS: 0.2500 mg | ORAL_TABLET | ORAL | Status: DC

## 2015-09-10 MED ORDER — PRAMIPEXOLE DIHYDROCHLORIDE ER 0.75 MG PO TB24: 1.0000 | ORAL_TABLET | Freq: Every day | ORAL | Status: DC
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
## 2015-09-12 ENCOUNTER — Encounter: Payer: Self-pay | Admitting: Cardiology

## 2015-09-12 NOTE — Assessment & Plan Note (Addendum)
-   Somewhat elevated today. On my recheck, blood pressure is 146/70 mmHg. Plan is initially increase Lasix to 40 mg daily along with spironolactone 25 mg that she was not taking. If pressures continue to be elevated, would consider ARB.  As we are still not sure if there is some type of neurocardiogenic component of her syncope, would allow for immediate mild permissive hypertension in the absence of heart failure.  I will have her follow-up with Phillips HayKristin Alvstad, RPH-CCP for reassessment of hypertension with heart failure. Pressures are high, I would consider starting ARB.

## 2015-09-12 NOTE — Assessment & Plan Note (Signed)
On statin. Monitored by PCP. 

## 2015-09-12 NOTE — Assessment & Plan Note (Signed)
No further episodes no evidence of sustained arrhythmias on Myoview. At this point we are unclear as to the true etiology of her syncope. 4/she still can't drive for another several months to complete 6 months from her last episode.

## 2015-09-12 NOTE — Assessment & Plan Note (Signed)
Swelling is actually worse than last time I saw her. Plan for now would be to increase Lasix to 40 mg daily. We talked about using Ace wraps until she is no longer on the leg brace. Then can consider compression stockings. In the absence of significant PND or orthopnea, think the edema is probably noncardiac in nature. In the future we can assess for any potential venous stasis or valvular insufficiency.

## 2015-09-16 ENCOUNTER — Encounter: Payer: Self-pay | Admitting: *Deleted

## 2015-10-01 ENCOUNTER — Encounter (HOSPITAL_BASED_OUTPATIENT_CLINIC_OR_DEPARTMENT_OTHER): Payer: BC Managed Care – PPO | Attending: Surgery

## 2015-10-01 DIAGNOSIS — I890 Lymphedema, not elsewhere classified: Secondary | ICD-10-CM | POA: Diagnosis not present

## 2015-10-01 DIAGNOSIS — Z7951 Long term (current) use of inhaled steroids: Secondary | ICD-10-CM | POA: Diagnosis not present

## 2015-10-01 DIAGNOSIS — Y838 Other surgical procedures as the cause of abnormal reaction of the patient, or of later complication, without mention of misadventure at the time of the procedure: Secondary | ICD-10-CM | POA: Insufficient documentation

## 2015-10-01 DIAGNOSIS — Z6841 Body Mass Index (BMI) 40.0 and over, adult: Secondary | ICD-10-CM | POA: Insufficient documentation

## 2015-10-01 DIAGNOSIS — Z87891 Personal history of nicotine dependence: Secondary | ICD-10-CM | POA: Diagnosis not present

## 2015-10-01 DIAGNOSIS — Z79891 Long term (current) use of opiate analgesic: Secondary | ICD-10-CM | POA: Insufficient documentation

## 2015-10-01 DIAGNOSIS — Z7982 Long term (current) use of aspirin: Secondary | ICD-10-CM | POA: Diagnosis not present

## 2015-10-01 DIAGNOSIS — D649 Anemia, unspecified: Secondary | ICD-10-CM | POA: Diagnosis not present

## 2015-10-01 DIAGNOSIS — K219 Gastro-esophageal reflux disease without esophagitis: Secondary | ICD-10-CM | POA: Insufficient documentation

## 2015-10-01 DIAGNOSIS — Z79899 Other long term (current) drug therapy: Secondary | ICD-10-CM | POA: Diagnosis not present

## 2015-10-01 DIAGNOSIS — A4901 Methicillin susceptible Staphylococcus aureus infection, unspecified site: Secondary | ICD-10-CM | POA: Diagnosis not present

## 2015-10-01 DIAGNOSIS — I10 Essential (primary) hypertension: Secondary | ICD-10-CM | POA: Diagnosis not present

## 2015-10-01 DIAGNOSIS — G4730 Sleep apnea, unspecified: Secondary | ICD-10-CM | POA: Diagnosis not present

## 2015-10-01 DIAGNOSIS — Z791 Long term (current) use of non-steroidal anti-inflammatories (NSAID): Secondary | ICD-10-CM | POA: Diagnosis not present

## 2015-10-01 DIAGNOSIS — T8131XA Disruption of external operation (surgical) wound, not elsewhere classified, initial encounter: Secondary | ICD-10-CM | POA: Insufficient documentation

## 2015-10-01 DIAGNOSIS — J45909 Unspecified asthma, uncomplicated: Secondary | ICD-10-CM | POA: Diagnosis not present

## 2015-10-01 DIAGNOSIS — Z9989 Dependence on other enabling machines and devices: Secondary | ICD-10-CM | POA: Insufficient documentation

## 2015-10-08 DIAGNOSIS — T8131XA Disruption of external operation (surgical) wound, not elsewhere classified, initial encounter: Secondary | ICD-10-CM | POA: Diagnosis not present

## 2015-10-15 ENCOUNTER — Encounter (HOSPITAL_BASED_OUTPATIENT_CLINIC_OR_DEPARTMENT_OTHER): Payer: BC Managed Care – PPO | Attending: Surgery

## 2015-10-15 DIAGNOSIS — Z9989 Dependence on other enabling machines and devices: Secondary | ICD-10-CM | POA: Diagnosis not present

## 2015-10-15 DIAGNOSIS — I10 Essential (primary) hypertension: Secondary | ICD-10-CM | POA: Insufficient documentation

## 2015-10-15 DIAGNOSIS — G4730 Sleep apnea, unspecified: Secondary | ICD-10-CM | POA: Insufficient documentation

## 2015-10-15 DIAGNOSIS — Z6841 Body Mass Index (BMI) 40.0 and over, adult: Secondary | ICD-10-CM | POA: Insufficient documentation

## 2015-10-15 DIAGNOSIS — X58XXXA Exposure to other specified factors, initial encounter: Secondary | ICD-10-CM | POA: Diagnosis not present

## 2015-10-15 DIAGNOSIS — Z87891 Personal history of nicotine dependence: Secondary | ICD-10-CM | POA: Diagnosis not present

## 2015-10-15 DIAGNOSIS — T8131XA Disruption of external operation (surgical) wound, not elsewhere classified, initial encounter: Secondary | ICD-10-CM | POA: Diagnosis not present

## 2015-10-21 ENCOUNTER — Ambulatory Visit (INDEPENDENT_AMBULATORY_CARE_PROVIDER_SITE_OTHER): Payer: BC Managed Care – PPO | Admitting: Pharmacist

## 2015-10-21 VITALS — BP 132/64 | Wt 271.2 lb

## 2015-10-21 DIAGNOSIS — I10 Essential (primary) hypertension: Secondary | ICD-10-CM

## 2015-10-21 DIAGNOSIS — E785 Hyperlipidemia, unspecified: Secondary | ICD-10-CM | POA: Diagnosis not present

## 2015-10-21 LAB — BASIC METABOLIC PANEL
BUN: 15 mg/dL (ref 7–25)
CO2: 24 mmol/L (ref 20–31)
CREATININE: 0.76 mg/dL (ref 0.50–0.99)
Calcium: 9.5 mg/dL (ref 8.6–10.4)
Chloride: 107 mmol/L (ref 98–110)
Glucose, Bld: 99 mg/dL (ref 65–99)
Potassium: 3.8 mmol/L (ref 3.5–5.3)
Sodium: 135 mmol/L (ref 135–146)

## 2015-10-21 MED ORDER — ROSUVASTATIN CALCIUM 20 MG PO TABS: 20.0000 mg | ORAL_TABLET | Freq: Every day | ORAL | Status: DC

## 2015-10-21 NOTE — Assessment & Plan Note (Signed)
BP is at goal today. Continue current medications at same doses. Check labs today after increasing lasix and resuming spironolactone. Resume use of CPAP machine at night. Try acetaminophen for pain relief to decrease NSAID intake. Monitor blood pressures at home and bring log and cuff to next appointment in 1 month. Consider ARB if home blood pressures are elevated.

## 2015-10-21 NOTE — Patient Instructions (Signed)
Return for a a follow up appointment in 1-2 months.  Your blood pressure today is 132/64   Check your blood pressure at home daily (if able) and keep record of the readings.  Take your BP meds as follows: Continue to take blood pressure medications as prescribed  Bring all of your meds, your BP cuff and your record of home blood pressures to your next appointment.  Exercise as you're able, try to walk approximately 30 minutes per day.  Keep salt intake to a minimum, especially watch canned and prepared boxed foods.  Eat more fresh fruits and vegetables and fewer canned items.  Avoid eating in fast food restaurants.    HOW TO TAKE YOUR BLOOD PRESSURE: . Rest 5 minutes before taking your blood pressure. .  Don't smoke or drink caffeinated beverages for at least 30 minutes before. . Take your blood pressure before (not after) you eat. . Sit comfortably with your back supported and both feet on the floor (don't cross your legs). . Elevate your arm to heart level on a table or a desk. . Use the proper sized cuff. It should fit smoothly and snugly around your bare upper arm. There should be enough room to slip a fingertip under the cuff. The bottom edge of the cuff should be 1 inch above the crease of the elbow. . Ideally, take 3 measurements at one sitting and record the average.
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
## 2015-10-22 DIAGNOSIS — T8131XA Disruption of external operation (surgical) wound, not elsewhere classified, initial encounter: Secondary | ICD-10-CM | POA: Diagnosis not present

## 2015-10-24 ENCOUNTER — Ambulatory Visit: Payer: BC Managed Care – PPO | Admitting: Neurology

## 2015-10-24 ENCOUNTER — Encounter: Payer: Self-pay | Admitting: Podiatry

## 2015-10-24 ENCOUNTER — Ambulatory Visit (INDEPENDENT_AMBULATORY_CARE_PROVIDER_SITE_OTHER): Payer: BC Managed Care – PPO | Admitting: Podiatry

## 2015-10-24 VITALS — BP 172/81 | HR 70 | Resp 14

## 2015-10-24 DIAGNOSIS — B351 Tinea unguium: Secondary | ICD-10-CM | POA: Diagnosis not present

## 2015-10-24 DIAGNOSIS — M79676 Pain in unspecified toe(s): Secondary | ICD-10-CM | POA: Diagnosis not present

## 2015-10-24 NOTE — Progress Notes (Signed)
   Subjective:    Patient ID: [name], female    DOB: 05/23/1950, 65 y.o.   MRN: [id number]  HPI this patient presents to the office with chief complaint of long, ingrowing toenails, especially big toes on both feet. She states that she had a trimalleolar ankle fracture, right and has developed a infected ulcer on her right ankle. She is being treated by the wound Center for this condition. She says she needed her nails done, but due to the ulcer, she came to this office for professional treatment. She states that the nails are painful and ingrown causing pain when she walks and wears her shoes. She presents the office today for an evaluation and treatment of this condition    Review of Systems  Constitutional: Positive for unexpected weight change.  Cardiovascular: Positive for leg swelling.  Neurological: Positive for seizures.  Hematological: Bruises/bleeds easily.       Objective:   Physical Exam GENERAL APPEARANCE: Alert, conversant. Appropriately groomed. No acute distress.  VASCULAR: Pedal pulses are  palpable at  [facility] and PT bilateral.  Capillary refill time is immediate to all digits,  Normal temperature gradient.  Digital hair growth is present bilateral  NEUROLOGIC: sensation is normal to 5.07 monofilament at 5/5 sites bilateral.  Light touch is intact bilateral, Muscle strength normal.  MUSCULOSKELETAL: acceptable muscle strength, tone and stability bilateral.  Intrinsic muscluature intact bilateral.  Rectus appearance of foot and digits noted bilateral.   DERMATOLOGIC: skin color, texture, and turgor are within normal limits.  No preulcerative lesions or ulcers  are seen, no interdigital maceration noted.  No open lesions present.   No drainage noted.  NAILS  Long thick disfigured pincer toenails hallux B/L         Assessment & Plan:  Onychomycosis B/L  IE  Debride nails  RTC 3 months   Helane GuntherGregory Mayer DPM

## 2015-10-24 NOTE — Addendum Note (Signed)
Addended by: Harlon FlorSOUTHERLAND, Alica Shellhammer L on: 10/24/2015 11:24 AM   Modules accepted: Orders, Medications

## 2015-10-29 DIAGNOSIS — T8131XA Disruption of external operation (surgical) wound, not elsewhere classified, initial encounter: Secondary | ICD-10-CM | POA: Diagnosis not present

## 2015-11-05 DIAGNOSIS — T8131XA Disruption of external operation (surgical) wound, not elsewhere classified, initial encounter: Secondary | ICD-10-CM | POA: Diagnosis not present

## 2015-11-06 ENCOUNTER — Ambulatory Visit: Payer: BC Managed Care – PPO | Admitting: Podiatry

## 2015-11-19 ENCOUNTER — Encounter (HOSPITAL_BASED_OUTPATIENT_CLINIC_OR_DEPARTMENT_OTHER): Payer: BC Managed Care – PPO | Attending: Surgery

## 2015-11-19 DIAGNOSIS — Z6841 Body Mass Index (BMI) 40.0 and over, adult: Secondary | ICD-10-CM | POA: Diagnosis not present

## 2015-11-19 DIAGNOSIS — G4730 Sleep apnea, unspecified: Secondary | ICD-10-CM | POA: Insufficient documentation

## 2015-11-19 DIAGNOSIS — D649 Anemia, unspecified: Secondary | ICD-10-CM | POA: Diagnosis not present

## 2015-11-19 DIAGNOSIS — I10 Essential (primary) hypertension: Secondary | ICD-10-CM | POA: Diagnosis not present

## 2015-11-19 DIAGNOSIS — I890 Lymphedema, not elsewhere classified: Secondary | ICD-10-CM | POA: Diagnosis not present

## 2015-11-19 DIAGNOSIS — Y838 Other surgical procedures as the cause of abnormal reaction of the patient, or of later complication, without mention of misadventure at the time of the procedure: Secondary | ICD-10-CM | POA: Insufficient documentation

## 2015-11-19 DIAGNOSIS — T8131XA Disruption of external operation (surgical) wound, not elsewhere classified, initial encounter: Secondary | ICD-10-CM | POA: Insufficient documentation

## 2015-11-26 DIAGNOSIS — T8131XA Disruption of external operation (surgical) wound, not elsewhere classified, initial encounter: Secondary | ICD-10-CM | POA: Diagnosis not present

## 2015-11-27 ENCOUNTER — Other Ambulatory Visit: Payer: Self-pay

## 2015-11-27 MED ORDER — FUROSEMIDE 40 MG PO TABS: 40.0000 mg | ORAL_TABLET | Freq: Every day | ORAL | Status: DC

## 2015-11-27 NOTE — Telephone Encounter (Signed)
Medication Refill

## 2015-11-28 ENCOUNTER — Other Ambulatory Visit: Payer: Self-pay | Admitting: *Deleted

## 2015-12-02 ENCOUNTER — Ambulatory Visit (INDEPENDENT_AMBULATORY_CARE_PROVIDER_SITE_OTHER): Payer: BC Managed Care – PPO | Admitting: Pharmacist

## 2015-12-02 VITALS — BP 148/80 | HR 73 | Wt 271.6 lb

## 2015-12-02 DIAGNOSIS — I10 Essential (primary) hypertension: Secondary | ICD-10-CM | POA: Diagnosis not present

## 2015-12-02 MED ORDER — VALSARTAN 80 MG PO TABS: 80.0000 mg | ORAL_TABLET | Freq: Every day | ORAL | 1 refills | Status: DC

## 2015-12-02 NOTE — Patient Instructions (Signed)
Return for a a follow up appointment in 3-4 weeks  Your blood pressure today is 148/80    Check your blood pressure at home daily (if able) and keep record of the readings.  Take your BP meds as follows: Start Valsartan 80mg  once daily in the morning   Bring all of your meds, your BP cuff and your record of home blood pressures to your next appointment.  Exercise as you're able, try to walk approximately 30 minutes per day.  Keep salt intake to a minimum, especially watch canned and prepared boxed foods.  Eat more fresh fruits and vegetables and fewer canned items.  Avoid eating in fast food restaurants.    HOW TO TAKE YOUR BLOOD PRESSURE: . Rest 5 minutes before taking your blood pressure. .  Don't smoke or drink caffeinated beverages for at least 30 minutes before. . Take your blood pressure before (not after) you eat. . Sit comfortably with your back supported and both feet on the floor (don't cross your legs). . Elevate your arm to heart level on a table or a desk. . Use the proper sized cuff. It should fit smoothly and snugly around your bare upper arm. There should be enough room to slip a fingertip under the cuff. The bottom edge of the cuff should be 1 inch above the crease of the elbow. . Ideally, take 3 measurements at one sitting and record the average.
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
## 2015-12-03 DIAGNOSIS — T8131XA Disruption of external operation (surgical) wound, not elsewhere classified, initial encounter: Secondary | ICD-10-CM | POA: Diagnosis not present

## 2015-12-04 ENCOUNTER — Encounter: Payer: Self-pay | Admitting: Pharmacist

## 2015-12-10 ENCOUNTER — Encounter (HOSPITAL_BASED_OUTPATIENT_CLINIC_OR_DEPARTMENT_OTHER): Payer: BC Managed Care – PPO | Attending: Surgery

## 2015-12-10 DIAGNOSIS — Y838 Other surgical procedures as the cause of abnormal reaction of the patient, or of later complication, without mention of misadventure at the time of the procedure: Secondary | ICD-10-CM | POA: Insufficient documentation

## 2015-12-10 DIAGNOSIS — G4730 Sleep apnea, unspecified: Secondary | ICD-10-CM | POA: Diagnosis not present

## 2015-12-10 DIAGNOSIS — L97811 Non-pressure chronic ulcer of other part of right lower leg limited to breakdown of skin: Secondary | ICD-10-CM | POA: Insufficient documentation

## 2015-12-10 DIAGNOSIS — T8131XA Disruption of external operation (surgical) wound, not elsewhere classified, initial encounter: Secondary | ICD-10-CM | POA: Insufficient documentation

## 2015-12-10 DIAGNOSIS — I10 Essential (primary) hypertension: Secondary | ICD-10-CM | POA: Insufficient documentation

## 2015-12-17 DIAGNOSIS — T8131XA Disruption of external operation (surgical) wound, not elsewhere classified, initial encounter: Secondary | ICD-10-CM | POA: Diagnosis not present

## 2015-12-24 DIAGNOSIS — T8131XA Disruption of external operation (surgical) wound, not elsewhere classified, initial encounter: Secondary | ICD-10-CM | POA: Diagnosis not present

## 2015-12-26 ENCOUNTER — Other Ambulatory Visit: Payer: Self-pay | Admitting: Neurology

## 2015-12-31 DIAGNOSIS — T8131XA Disruption of external operation (surgical) wound, not elsewhere classified, initial encounter: Secondary | ICD-10-CM | POA: Diagnosis not present

## 2016-01-02 ENCOUNTER — Other Ambulatory Visit: Payer: Self-pay

## 2016-01-02 ENCOUNTER — Ambulatory Visit (INDEPENDENT_AMBULATORY_CARE_PROVIDER_SITE_OTHER): Payer: BC Managed Care – PPO | Admitting: Pharmacist Clinician (PhC)/ Clinical Pharmacy Specialist

## 2016-01-02 DIAGNOSIS — I10 Essential (primary) hypertension: Secondary | ICD-10-CM

## 2016-01-02 LAB — BASIC METABOLIC PANEL
BUN: 13 mg/dL (ref 7–25)
CHLORIDE: 103 mmol/L (ref 98–110)
CO2: 26 mmol/L (ref 20–31)
CREATININE: 0.75 mg/dL (ref 0.50–0.99)
Calcium: 10.1 mg/dL (ref 8.6–10.4)
GLUCOSE: 84 mg/dL (ref 65–99)
Potassium: 4 mmol/L (ref 3.5–5.3)
Sodium: 138 mmol/L (ref 135–146)

## 2016-01-02 MED ORDER — VALSARTAN 80 MG PO TABS: 80.0000 mg | ORAL_TABLET | Freq: Every day | ORAL | 1 refills | Status: DC

## 2016-01-02 NOTE — Patient Instructions (Signed)
Return for a a follow up appointment with Dr. Herbie BaltimoreHarding on Sept 11   Your blood pressure today is 122/58  Check your blood pressure at home once to twice daily and keep record of the readings.  Take your BP meds as follows:  Cut spironolactone to 1/2 tablet each day.    Monday increase valsartan to 160 mg once daily.    If BP remains stable < 140 systolic then go ahead and stop the spironolactone altogether.     Call the office Belenda Cruise(Kristin or Nicholaus BloomKelley - [phone]) with any questions or concerns.    Bring all of your meds, your BP cuff and your record of home blood pressures to your next appointment.  Exercise as you're able, try to walk approximately 30 minutes per day.  Keep salt intake to a minimum, especially watch canned and prepared boxed foods.  Eat more fresh fruits and vegetables and fewer canned items.  Avoid eating in fast food restaurants.    HOW TO TAKE YOUR BLOOD PRESSURE: . Rest 5 minutes before taking your blood pressure. .  Don't smoke or drink caffeinated beverages for at least 30 minutes before. . Take your blood pressure before (not after) you eat. . Sit comfortably with your back supported and both feet on the floor (don't cross your legs). . Elevate your arm to heart level on a table or a desk. . Use the proper sized cuff. It should fit smoothly and snugly around your bare upper arm. There should be enough room to slip a fingertip under the cuff. The bottom edge of the cuff should be 1 inch above the crease of the elbow. . Ideally, take 3 measurements at one sitting and record the average.

## 2016-01-03 ENCOUNTER — Encounter: Payer: Self-pay | Admitting: Pharmacist Clinician (PhC)/ Clinical Pharmacy Specialist
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
## 2016-01-03 NOTE — Assessment & Plan Note (Addendum)
Today her blood pressure is quite well controlled at 122/58.  Because of her problems with mobility, I am going to decrease her spironolactone to 12.5 mg daily and have her increase the valsartan to 160 mg.  She is to call if her blood pressure changes significantly either up or down.  My hope is that when she sees Dr. Herbie BaltimoreHarding in 3 weeks, he will be able to stop the spironolactone.

## 2016-01-07 DIAGNOSIS — T8131XA Disruption of external operation (surgical) wound, not elsewhere classified, initial encounter: Secondary | ICD-10-CM | POA: Diagnosis not present

## 2016-01-08 ENCOUNTER — Ambulatory Visit (INDEPENDENT_AMBULATORY_CARE_PROVIDER_SITE_OTHER): Payer: BC Managed Care – PPO | Admitting: Podiatry

## 2016-01-08 DIAGNOSIS — B351 Tinea unguium: Secondary | ICD-10-CM

## 2016-01-08 DIAGNOSIS — M79676 Pain in unspecified toe(s): Secondary | ICD-10-CM

## 2016-01-08 NOTE — Progress Notes (Signed)
   Subjective:    Patient ID: [name], female    DOB: Jan 13, 1951, 65 y.o.   MRN: [id number]  HPI this patient presents to the office with chief complaint of long, ingrowing toenails, especially big toes on both feet. . She is being treated by the wound Center for ulcer right ankle. She says she needed her nails done, but due to the ulcer, she came to this office for professional treatment. She states that the nails are painful and ingrown causing pain when she walks and wears her shoes. She presents the office today for an evaluation and treatment of this condition    Review of Systems  Constitutional: Positive for unexpected weight change.  Cardiovascular: Positive for leg swelling.  Neurological: Positive for seizures.  Hematological: Bruises/bleeds easily.       Objective:   Physical Exam GENERAL APPEARANCE: Alert, conversant. Appropriately groomed. No acute distress.  VASCULAR: Pedal pulses are  palpable at  [facility] bilateral.  Capillary refill time is immediate to all digits,  Normal temperature gradient.  Digital hair growth is present bilateral  NEUROLOGIC: sensation is normal to 5.07 monofilament at 5/5 sites bilateral.  Light touch is intact bilateral, Muscle strength normal.  MUSCULOSKELETAL: acceptable muscle strength, tone and stability bilateral.  Intrinsic muscluature intact bilateral.  Rectus appearance of foot and digits noted bilateral.   DERMATOLOGIC: skin color, texture, and turgor are within normal limits.  No preulcerative lesions or ulcers  are seen, no interdigital maceration noted.  No open lesions present.   No drainage noted.  NAILS  Long thick disfigured pincer toenails hallux B/L         Assessment & Plan:  Onychomycosis B/L  IE  Debride nails  RTC 10 weeks   Helane GuntherGregory Zakari Couchman DPM

## 2016-01-09 ENCOUNTER — Other Ambulatory Visit: Payer: Self-pay | Admitting: Pharmacist Clinician (PhC)/ Clinical Pharmacy Specialist

## 2016-01-09 MED ORDER — VALSARTAN 80 MG PO TABS: 160.0000 mg | ORAL_TABLET | Freq: Every day | ORAL | 1 refills | Status: DC

## 2016-01-14 ENCOUNTER — Other Ambulatory Visit: Payer: Self-pay

## 2016-01-14 ENCOUNTER — Encounter (HOSPITAL_BASED_OUTPATIENT_CLINIC_OR_DEPARTMENT_OTHER): Payer: BC Managed Care – PPO | Attending: Surgery

## 2016-01-14 DIAGNOSIS — T8131XA Disruption of external operation (surgical) wound, not elsewhere classified, initial encounter: Secondary | ICD-10-CM | POA: Insufficient documentation

## 2016-01-14 DIAGNOSIS — Z6841 Body Mass Index (BMI) 40.0 and over, adult: Secondary | ICD-10-CM | POA: Insufficient documentation

## 2016-01-14 DIAGNOSIS — E669 Obesity, unspecified: Secondary | ICD-10-CM | POA: Diagnosis not present

## 2016-01-14 DIAGNOSIS — I890 Lymphedema, not elsewhere classified: Secondary | ICD-10-CM | POA: Diagnosis not present

## 2016-01-14 DIAGNOSIS — I10 Essential (primary) hypertension: Secondary | ICD-10-CM | POA: Diagnosis not present

## 2016-01-14 DIAGNOSIS — Y839 Surgical procedure, unspecified as the cause of abnormal reaction of the patient, or of later complication, without mention of misadventure at the time of the procedure: Secondary | ICD-10-CM | POA: Insufficient documentation

## 2016-01-14 DIAGNOSIS — G4730 Sleep apnea, unspecified: Secondary | ICD-10-CM | POA: Insufficient documentation

## 2016-01-14 MED ORDER — VALSARTAN 80 MG PO TABS: 160.0000 mg | ORAL_TABLET | Freq: Every day | ORAL | 1 refills | Status: DC

## 2016-01-15 ENCOUNTER — Other Ambulatory Visit: Payer: Self-pay | Admitting: Cardiology

## 2016-01-15 ENCOUNTER — Other Ambulatory Visit: Payer: Self-pay | Admitting: Adult Health

## 2016-01-15 DIAGNOSIS — G2581 Restless legs syndrome: Secondary | ICD-10-CM

## 2016-01-15 MED ORDER — VALSARTAN 80 MG PO TABS: 160.0000 mg | ORAL_TABLET | Freq: Every day | ORAL | 1 refills | Status: DC

## 2016-01-15 MED ORDER — PRAMIPEXOLE DIHYDROCHLORIDE 0.25 MG PO TABS: 0.2500 mg | ORAL_TABLET | ORAL | 3 refills | Status: DC

## 2016-01-15 MED ORDER — PRAMIPEXOLE DIHYDROCHLORIDE ER 0.75 MG PO TB24: 1.0000 | ORAL_TABLET | Freq: Every day | ORAL | 3 refills | Status: DC

## 2016-01-15 NOTE — Addendum Note (Signed)
Addended byHermenia Fiscal: YOUNG, SANDRA on: 01/15/2016 04:55 PM   Modules accepted: Orders

## 2016-01-15 NOTE — Telephone Encounter (Signed)
I called and spoke to [name].  She tells me that she is taking mirapex 0.25mg  tabs one in am and pm and a 0.75mg  tablet in pm.  She told Dr. Vickey Hugerohmeier this when in to see her.   I do not see this in note.  She states this is what works for her.  I relayed that Dr. Vickey Hugerohmeier is not availabe to converse with, but [facility], this may need to be address when Dr. Vickey Hugerohmeier back.

## 2016-01-15 NOTE — Telephone Encounter (Signed)
Patient is calling to get a new Rx called to [facility] on [address] for pramipexole (MIRAPEX) 0.25 MG tablet. The patient states she take 2 pills a day. The original Rx says 1 tablet a day.

## 2016-01-16 NOTE — Telephone Encounter (Signed)
I relayed to pt that [facility] did refill medications.  Would forward message to Dr. Vickey Hugerohmeier about the mirapex 0.25mg  tab dosing.  She also wanted to let you know that her ankle fracture not healing well.  Has a screw loose.  Has appt with [facility] to talk about ankle replacement.

## 2016-01-18 ENCOUNTER — Other Ambulatory Visit: Payer: Self-pay | Admitting: Neurology

## 2016-01-20 ENCOUNTER — Encounter: Payer: Self-pay | Admitting: Cardiology

## 2016-01-20 ENCOUNTER — Ambulatory Visit (INDEPENDENT_AMBULATORY_CARE_PROVIDER_SITE_OTHER): Payer: BC Managed Care – PPO | Admitting: Cardiology

## 2016-01-20 VITALS — BP 110/50 | HR 59 | Ht 62.0 in | Wt 274.0 lb

## 2016-01-20 DIAGNOSIS — E785 Hyperlipidemia, unspecified: Secondary | ICD-10-CM

## 2016-01-20 DIAGNOSIS — R55 Syncope and collapse: Secondary | ICD-10-CM

## 2016-01-20 DIAGNOSIS — R600 Localized edema: Secondary | ICD-10-CM

## 2016-01-20 DIAGNOSIS — I10 Essential (primary) hypertension: Secondary | ICD-10-CM | POA: Diagnosis not present

## 2016-01-20 NOTE — Assessment & Plan Note (Signed)
On statin. Monitored by PCP. 

## 2016-01-20 NOTE — Assessment & Plan Note (Signed)
Stable now on her current dose of Lasix. Compression stockings seem to be helping. Now that her wound is healed, I think she can put them on more easily.

## 2016-01-20 NOTE — Patient Instructions (Signed)
Schedule appointment with Pharmacist in 03/2016     Your physician wants you to follow-up in: 9 months. You will receive a reminder letter in the mail two months in advance. If you don't receive a letter, please call our office to schedule the follow-up appointment.

## 2016-01-20 NOTE — Assessment & Plan Note (Signed)
Her blood pressure looks much better now. Has been stable on her current regimen. I think for now we will continue with spironolactone in addition to her Lasix. We will have a follow-up appointment with our pharmacy hypertension team in a couple months to see what her blood pressures are doing at that time. If stable at think we may be able to hold spironolactone.
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
## 2016-01-20 NOTE — Assessment & Plan Note (Signed)
No clear etiology determined. Probably related to UTI related symptoms. It has been 6 months since her last episode. She is probably clear to drive now.

## 2016-01-20 NOTE — Assessment & Plan Note (Signed)
The patient understands the need to lose weight with diet and exercise. We have discussed specific strategies for this.  

## 2016-01-28 ENCOUNTER — Other Ambulatory Visit: Payer: Self-pay | Admitting: Neurology

## 2016-01-28 MED ORDER — POTASSIUM CHLORIDE ER 10 MEQ PO TBCR: 10.0000 meq | EXTENDED_RELEASE_TABLET | Freq: Two times a day (BID) | ORAL | 1 refills | Status: DC

## 2016-01-28 NOTE — Telephone Encounter (Signed)
Shelley BeesShanker M Ghimire, MD discontinued the potassium chloride upon her discharge from the hospital.  I spoke to pt. She is unsure of why this doctor discontinued the potassium chloride. She says that she has been taking it "since Jesus was a baby". She has had to have IV infusion of potassium one time because it was so low and pt says that she does not want to do that again. Pt confirms that she is taking potassium chloride 10 mEq BID.  Dr. Vickey Hugerohmeier, do you want to resume this medication?

## 2016-01-28 NOTE — Addendum Note (Signed)
Addended by: Geronimo RunningINKINS, Lakhia Gengler A on: 01/28/2016 11:40 AM   Modules accepted: Orders

## 2016-01-28 NOTE — Telephone Encounter (Signed)
Patient requesting refill of  potassium chloride (K-DUR) 10 MEQ tablet called to [facility] on [address].

## 2016-02-04 ENCOUNTER — Other Ambulatory Visit: Payer: Self-pay | Admitting: Neurology

## 2016-02-04 NOTE — Telephone Encounter (Signed)
RX for luminal faxed to [facility]. Received a receipt of confirmation.  

## 2016-02-13 ENCOUNTER — Other Ambulatory Visit: Payer: Self-pay | Admitting: Neurology

## 2016-03-11 ENCOUNTER — Encounter (HOSPITAL_BASED_OUTPATIENT_CLINIC_OR_DEPARTMENT_OTHER): Payer: Self-pay | Admitting: *Deleted

## 2016-03-11 ENCOUNTER — Other Ambulatory Visit: Payer: Self-pay | Admitting: Orthopedic Surgery
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
## 2016-03-12 ENCOUNTER — Inpatient Hospital Stay (HOSPITAL_COMMUNITY)
Admission: AD | Admit: 2016-03-12 | Discharge: 2016-03-13 | DRG: 493 | Disposition: A | Discharge status: Home Health | Payer: BC Managed Care – PPO | Source: Ambulatory Visit | Attending: Orthopedic Surgery | Admitting: Orthopedic Surgery

## 2016-03-12 ENCOUNTER — Ambulatory Visit (HOSPITAL_COMMUNITY): Payer: BC Managed Care – PPO | Admitting: Certified Registered Nurse Anesthetist

## 2016-03-12 ENCOUNTER — Encounter (HOSPITAL_COMMUNITY)
Admission: AD | Disposition: A | Discharge status: Home Health | Payer: Self-pay | Source: Ambulatory Visit | Attending: Orthopedic Surgery

## 2016-03-12 ENCOUNTER — Encounter (HOSPITAL_COMMUNITY): Payer: Self-pay | Admitting: Certified Registered Nurse Anesthetist

## 2016-03-12 DIAGNOSIS — G40909 Epilepsy, unspecified, not intractable, without status epilepticus: Secondary | ICD-10-CM | POA: Diagnosis present

## 2016-03-12 DIAGNOSIS — I10 Essential (primary) hypertension: Secondary | ICD-10-CM | POA: Diagnosis present

## 2016-03-12 DIAGNOSIS — Z79899 Other long term (current) drug therapy: Secondary | ICD-10-CM

## 2016-03-12 DIAGNOSIS — K219 Gastro-esophageal reflux disease without esophagitis: Secondary | ICD-10-CM | POA: Diagnosis present

## 2016-03-12 DIAGNOSIS — Z87891 Personal history of nicotine dependence: Secondary | ICD-10-CM | POA: Diagnosis not present

## 2016-03-12 DIAGNOSIS — Z7982 Long term (current) use of aspirin: Secondary | ICD-10-CM | POA: Diagnosis not present

## 2016-03-12 DIAGNOSIS — Z6841 Body Mass Index (BMI) 40.0 and over, adult: Secondary | ICD-10-CM | POA: Diagnosis not present

## 2016-03-12 DIAGNOSIS — J45909 Unspecified asthma, uncomplicated: Secondary | ICD-10-CM | POA: Diagnosis present

## 2016-03-12 DIAGNOSIS — Z981 Arthrodesis status: Secondary | ICD-10-CM

## 2016-03-12 DIAGNOSIS — S82891A Other fracture of right lower leg, initial encounter for closed fracture: Secondary | ICD-10-CM | POA: Diagnosis present

## 2016-03-12 DIAGNOSIS — E785 Hyperlipidemia, unspecified: Secondary | ICD-10-CM | POA: Diagnosis present

## 2016-03-12 DIAGNOSIS — Y838 Other surgical procedures as the cause of abnormal reaction of the patient, or of later complication, without mention of misadventure at the time of the procedure: Secondary | ICD-10-CM | POA: Diagnosis present

## 2016-03-12 DIAGNOSIS — T84116A Breakdown (mechanical) of internal fixation device of bone of right lower leg, initial encounter: Secondary | ICD-10-CM | POA: Diagnosis present

## 2016-03-12 DIAGNOSIS — G4733 Obstructive sleep apnea (adult) (pediatric): Secondary | ICD-10-CM | POA: Diagnosis present

## 2016-03-12 DIAGNOSIS — G2581 Restless legs syndrome: Secondary | ICD-10-CM | POA: Diagnosis present

## 2016-03-12 HISTORY — DX: Pneumonia, unspecified organism: J18.9

## 2016-03-12 HISTORY — DX: Functional diarrhea: K59.1

## 2016-03-12 HISTORY — PX: OTHER SURGICAL HISTORY: SHX169

## 2016-03-12 HISTORY — DX: Anemia, unspecified: D64.9

## 2016-03-12 HISTORY — PX: ANKLE FUSION: SHX881

## 2016-03-12 LAB — BASIC METABOLIC PANEL
ANION GAP: 9 (ref 5–15)
BUN: 20 mg/dL (ref 6–20)
CHLORIDE: 106 mmol/L (ref 101–111)
CO2: 22 mmol/L (ref 22–32)
Calcium: 9.8 mg/dL (ref 8.9–10.3)
Creatinine, Ser: 0.74 mg/dL (ref 0.44–1.00)
GFR calc Af Amer: >60 mL/min (ref >60)
Glucose, Bld: 97 mg/dL (ref 65–99)
POTASSIUM: 3.7 mmol/L (ref 3.5–5.1)
SODIUM: 137 mmol/L (ref 135–145)

## 2016-03-12 LAB — CBC
HEMATOCRIT: 38 % (ref 36.0–46.0)
HEMOGLOBIN: 12.8 g/dL (ref 12.0–15.0)
MCH: 30.7 pg (ref 26.0–34.0)
MCHC: 33.7 g/dL (ref 30.0–36.0)
MCV: 91.1 fL (ref 78.0–100.0)
Platelets: 201 10*3/uL (ref 150–400)
RBC: 4.17 MIL/uL (ref 3.87–5.11)
RDW: 13.2 % (ref 11.5–15.5)
WBC: 7.6 10*3/uL (ref 4.0–10.5)

## 2016-03-12 SURGERY — ARTHRODESIS ANKLE
Anesthesia: General | Site: Ankle | Laterality: Right

## 2016-03-12 MED ORDER — ONDANSETRON HCL 4 MG/2ML IJ SOLN
INTRAMUSCULAR | Status: DC | PRN
  Administered 2016-03-12: 4 mg via INTRAVENOUS

## 2016-03-12 MED ORDER — FENTANYL CITRATE (PF) 100 MCG/2ML IJ SOLN
INTRAMUSCULAR | Status: AC
  Filled 2016-03-12: qty 2

## 2016-03-12 MED ORDER — ONDANSETRON HCL 4 MG PO TABS: 4.0000 mg | ORAL_TABLET | Freq: Four times a day (QID) | ORAL | Status: DC | PRN

## 2016-03-12 MED ORDER — PRAMIPEXOLE DIHYDROCHLORIDE ER 0.75 MG PO TB24: 1.0000 | ORAL_TABLET | Freq: Every day | ORAL | Status: DC

## 2016-03-12 MED ORDER — ALBUTEROL SULFATE (2.5 MG/3ML) 0.083% IN NEBU: 3.0000 mL | INHALATION_SOLUTION | RESPIRATORY_TRACT | Status: DC | PRN

## 2016-03-12 MED ORDER — ACETAMINOPHEN 325 MG PO TABS
650.0000 mg | ORAL_TABLET | Freq: Four times a day (QID) | ORAL | Status: DC | PRN
  Administered 2016-03-13: 650 mg via ORAL
  Filled 2016-03-12: qty 2

## 2016-03-12 MED ORDER — FLUTICASONE PROPIONATE 50 MCG/ACT NA SUSP
2.0000 | Freq: Two times a day (BID) | NASAL | Status: DC
  Filled 2016-03-12: qty 16

## 2016-03-12 MED ORDER — IRBESARTAN 150 MG PO TABS
75.0000 mg | ORAL_TABLET | Freq: Every day | ORAL | Status: DC
  Administered 2016-03-12 – 2016-03-13 (×2): 75 mg via ORAL
  Filled 2016-03-12 (×2): qty 1

## 2016-03-12 MED ORDER — SUGAMMADEX SODIUM 500 MG/5ML IV SOLN
INTRAVENOUS | Status: AC
  Filled 2016-03-12: qty 5

## 2016-03-12 MED ORDER — ROCURONIUM BROMIDE 10 MG/ML (PF) SYRINGE
PREFILLED_SYRINGE | INTRAVENOUS | Status: DC | PRN
  Administered 2016-03-12: 40 mg via INTRAVENOUS

## 2016-03-12 MED ORDER — CEFAZOLIN SODIUM-DEXTROSE 2-4 GM/100ML-% IV SOLN
INTRAVENOUS | Status: AC
  Filled 2016-03-12: qty 100

## 2016-03-12 MED ORDER — ROSUVASTATIN CALCIUM 10 MG PO TABS
20.0000 mg | ORAL_TABLET | Freq: Every evening | ORAL | Status: DC
  Administered 2016-03-12: 20 mg via ORAL
  Filled 2016-03-12: qty 2

## 2016-03-12 MED ORDER — HYDROMORPHONE HCL 2 MG/ML IJ SOLN
INTRAMUSCULAR | Status: AC
  Filled 2016-03-12: qty 1

## 2016-03-12 MED ORDER — GABAPENTIN ENACARBIL ER 600 MG PO TBCR: 900.0000 mg | EXTENDED_RELEASE_TABLET | Freq: Every day | ORAL | Status: DC

## 2016-03-12 MED ORDER — BIOTIN 5000 MCG PO CAPS: 5000.0000 ug | ORAL_CAPSULE | Freq: Two times a day (BID) | ORAL | Status: DC

## 2016-03-12 MED ORDER — DOCUSATE SODIUM 100 MG PO CAPS
100.0000 mg | ORAL_CAPSULE | Freq: Two times a day (BID) | ORAL | Status: DC
  Administered 2016-03-12 – 2016-03-13 (×3): 100 mg via ORAL
  Filled 2016-03-12 (×3): qty 1

## 2016-03-12 MED ORDER — PROPOFOL 10 MG/ML IV BOLUS
INTRAVENOUS | Status: DC | PRN
  Administered 2016-03-12: 160 mg via INTRAVENOUS
  Administered 2016-03-12: 40 mg via INTRAVENOUS

## 2016-03-12 MED ORDER — LIDOCAINE 2% (20 MG/ML) 5 ML SYRINGE
INTRAMUSCULAR | Status: AC
  Filled 2016-03-12: qty 5

## 2016-03-12 MED ORDER — SUCCINYLCHOLINE CHLORIDE 200 MG/10ML IV SOSY
PREFILLED_SYRINGE | INTRAVENOUS | Status: AC
  Filled 2016-03-12: qty 10

## 2016-03-12 MED ORDER — ROCURONIUM BROMIDE 10 MG/ML (PF) SYRINGE
PREFILLED_SYRINGE | INTRAVENOUS | Status: AC
  Filled 2016-03-12: qty 10

## 2016-03-12 MED ORDER — MIDAZOLAM HCL 5 MG/5ML IJ SOLN
INTRAMUSCULAR | Status: DC | PRN
  Administered 2016-03-12: 2 mg via INTRAVENOUS

## 2016-03-12 MED ORDER — NABUMETONE 750 MG PO TABS
750.0000 mg | ORAL_TABLET | Freq: Two times a day (BID) | ORAL | Status: DC
  Administered 2016-03-12 – 2016-03-13 (×3): 750 mg via ORAL
  Filled 2016-03-12 (×3): qty 1

## 2016-03-12 MED ORDER — PRAMIPEXOLE DIHYDROCHLORIDE 0.125 MG PO TABS: 0.2500 mg | ORAL_TABLET | ORAL | Status: DC

## 2016-03-12 MED ORDER — BACITRACIN ZINC 500 UNIT/GM EX OINT
TOPICAL_OINTMENT | CUTANEOUS | Status: AC
  Filled 2016-03-12: qty 28.35

## 2016-03-12 MED ORDER — OXYCODONE HCL 5 MG PO TABS
5.0000 mg | ORAL_TABLET | ORAL | Status: DC | PRN
  Administered 2016-03-12: 10 mg via ORAL
  Administered 2016-03-12: 5 mg via ORAL
  Administered 2016-03-13 (×2): 10 mg via ORAL
  Filled 2016-03-12: qty 1
  Filled 2016-03-12 (×3): qty 2

## 2016-03-12 MED ORDER — HYDROMORPHONE HCL 1 MG/ML IJ SOLN
0.2500 mg | INTRAMUSCULAR | Status: DC | PRN
  Administered 2016-03-12: 0.25 mg via INTRAVENOUS
  Administered 2016-03-12: 0.5 mg via INTRAVENOUS
  Administered 2016-03-12 (×2): 0.25 mg via INTRAVENOUS
  Administered 2016-03-12: 0.5 mg via INTRAVENOUS

## 2016-03-12 MED ORDER — DIPHENHYDRAMINE HCL 12.5 MG/5ML PO ELIX: 12.5000 mg | ORAL_SOLUTION | ORAL | Status: DC | PRN

## 2016-03-12 MED ORDER — SODIUM CHLORIDE 0.9 % IV SOLN
INTRAVENOUS | Status: DC
  Administered 2016-03-12 – 2016-03-13 (×2): via INTRAVENOUS

## 2016-03-12 MED ORDER — SUGAMMADEX SODIUM 500 MG/5ML IV SOLN
INTRAVENOUS | Status: DC | PRN
  Administered 2016-03-12: 250 mg via INTRAVENOUS

## 2016-03-12 MED ORDER — SODIUM CHLORIDE 0.9 % IV SOLN: INTRAVENOUS | Status: DC

## 2016-03-12 MED ORDER — WHITE PETROLATUM GEL
Status: AC
  Administered 2016-03-12: 23:00:00
  Filled 2016-03-12: qty 1

## 2016-03-12 MED ORDER — SUCCINYLCHOLINE CHLORIDE 200 MG/10ML IV SOSY
PREFILLED_SYRINGE | INTRAVENOUS | Status: DC | PRN
  Administered 2016-03-12: 120 mg via INTRAVENOUS

## 2016-03-12 MED ORDER — PHENOBARBITAL 32.4 MG PO TABS
129.6000 mg | ORAL_TABLET | Freq: Every day | ORAL | Status: DC
  Administered 2016-03-12: 129.6 mg via ORAL
  Filled 2016-03-12 (×2): qty 4

## 2016-03-12 MED ORDER — AMLODIPINE BESYLATE 10 MG PO TABS
10.0000 mg | ORAL_TABLET | Freq: Every evening | ORAL | Status: DC
  Administered 2016-03-12: 10 mg via ORAL
  Filled 2016-03-12: qty 1

## 2016-03-12 MED ORDER — FENTANYL CITRATE (PF) 100 MCG/2ML IJ SOLN
INTRAMUSCULAR | Status: AC
  Filled 2016-03-12: qty 4

## 2016-03-12 MED ORDER — PHENYLEPHRINE HCL 10 MG/ML IJ SOLN
INTRAMUSCULAR | Status: DC | PRN
  Administered 2016-03-12: 120 ug via INTRAVENOUS
  Administered 2016-03-12: 80 ug via INTRAVENOUS
  Administered 2016-03-12: 120 ug via INTRAVENOUS
  Administered 2016-03-12: 80 ug via INTRAVENOUS

## 2016-03-12 MED ORDER — LACTATED RINGERS IV SOLN
INTRAVENOUS | Status: DC | PRN
  Administered 2016-03-12 (×2): via INTRAVENOUS

## 2016-03-12 MED ORDER — PROPOFOL 10 MG/ML IV BOLUS
INTRAVENOUS | Status: AC
  Filled 2016-03-12: qty 20

## 2016-03-12 MED ORDER — METOCLOPRAMIDE HCL 5 MG/ML IJ SOLN: 5.0000 mg | Freq: Three times a day (TID) | INTRAMUSCULAR | Status: DC | PRN

## 2016-03-12 MED ORDER — CHLORHEXIDINE GLUCONATE 4 % EX LIQD: 60.0000 mL | Freq: Once | CUTANEOUS | Status: DC

## 2016-03-12 MED ORDER — ONDANSETRON HCL 4 MG/2ML IJ SOLN: 4.0000 mg | Freq: Four times a day (QID) | INTRAMUSCULAR | Status: DC | PRN

## 2016-03-12 MED ORDER — MORPHINE SULFATE (PF) 2 MG/ML IV SOLN
2.0000 mg | INTRAVENOUS | Status: DC | PRN
  Administered 2016-03-12 – 2016-03-13 (×4): 2 mg via INTRAVENOUS
  Filled 2016-03-12 (×4): qty 1

## 2016-03-12 MED ORDER — ONDANSETRON HCL 4 MG/2ML IJ SOLN
INTRAMUSCULAR | Status: AC
  Filled 2016-03-12: qty 2

## 2016-03-12 MED ORDER — DOXYCYCLINE 40 MG PO CPDR: 40.0000 mg | DELAYED_RELEASE_CAPSULE | ORAL | Status: DC

## 2016-03-12 MED ORDER — PANTOPRAZOLE SODIUM 40 MG PO TBEC
40.0000 mg | DELAYED_RELEASE_TABLET | Freq: Every day | ORAL | Status: DC
  Administered 2016-03-13: 40 mg via ORAL
  Filled 2016-03-12: qty 1

## 2016-03-12 MED ORDER — PROMETHAZINE HCL 25 MG/ML IJ SOLN: 6.2500 mg | INTRAMUSCULAR | Status: DC | PRN

## 2016-03-12 MED ORDER — LORATADINE 10 MG PO TABS
10.0000 mg | ORAL_TABLET | Freq: Every evening | ORAL | Status: DC
  Administered 2016-03-12: 10 mg via ORAL
  Filled 2016-03-12: qty 1

## 2016-03-12 MED ORDER — FERROUS SULFATE 325 (65 FE) MG PO TABS
325.0000 mg | ORAL_TABLET | Freq: Two times a day (BID) | ORAL | Status: DC
  Administered 2016-03-12 – 2016-03-13 (×2): 325 mg via ORAL
  Filled 2016-03-12 (×2): qty 1

## 2016-03-12 MED ORDER — EPHEDRINE SULFATE-NACL 50-0.9 MG/10ML-% IV SOSY
PREFILLED_SYRINGE | INTRAVENOUS | Status: DC | PRN
  Administered 2016-03-12: 5 mg via INTRAVENOUS

## 2016-03-12 MED ORDER — ENOXAPARIN SODIUM 40 MG/0.4ML ~~LOC~~ SOLN
40.0000 mg | SUBCUTANEOUS | Status: DC
  Administered 2016-03-13: 40 mg via SUBCUTANEOUS
  Filled 2016-03-12: qty 0.4

## 2016-03-12 MED ORDER — MIDAZOLAM HCL 2 MG/2ML IJ SOLN
INTRAMUSCULAR | Status: AC
  Filled 2016-03-12: qty 2

## 2016-03-12 MED ORDER — POTASSIUM CHLORIDE ER 10 MEQ PO TBCR
10.0000 meq | EXTENDED_RELEASE_TABLET | Freq: Two times a day (BID) | ORAL | Status: DC
  Administered 2016-03-12 – 2016-03-13 (×3): 10 meq via ORAL
  Filled 2016-03-12 (×6): qty 1

## 2016-03-12 MED ORDER — METOCLOPRAMIDE HCL 5 MG PO TABS: 5.0000 mg | ORAL_TABLET | Freq: Three times a day (TID) | ORAL | Status: DC | PRN

## 2016-03-12 MED ORDER — BUPIVACAINE HCL (PF) 0.5 % IJ SOLN
INTRAMUSCULAR | Status: AC
  Filled 2016-03-12: qty 30

## 2016-03-12 MED ORDER — LIDOCAINE 2% (20 MG/ML) 5 ML SYRINGE
INTRAMUSCULAR | Status: DC | PRN
  Administered 2016-03-12: 100 mg via INTRAVENOUS

## 2016-03-12 MED ORDER — VITAMIN D 1000 UNITS PO TABS
6000.0000 [IU] | ORAL_TABLET | Freq: Every day | ORAL | Status: DC
  Administered 2016-03-12 – 2016-03-13 (×2): 6000 [IU] via ORAL
  Filled 2016-03-12 (×2): qty 6

## 2016-03-12 MED ORDER — SODIUM CHLORIDE 0.9 % IR SOLN
Status: DC | PRN
  Administered 2016-03-12: 3000 mL

## 2016-03-12 MED ORDER — ACETAMINOPHEN 650 MG RE SUPP: 650.0000 mg | Freq: Four times a day (QID) | RECTAL | Status: DC | PRN

## 2016-03-12 MED ORDER — PHENYLEPHRINE 40 MCG/ML (10ML) SYRINGE FOR IV PUSH (FOR BLOOD PRESSURE SUPPORT)
PREFILLED_SYRINGE | INTRAVENOUS | Status: AC
  Filled 2016-03-12: qty 10

## 2016-03-12 MED ORDER — FUROSEMIDE 40 MG PO TABS
40.0000 mg | ORAL_TABLET | Freq: Every day | ORAL | Status: DC
  Administered 2016-03-12 – 2016-03-13 (×2): 40 mg via ORAL
  Filled 2016-03-12 (×2): qty 1

## 2016-03-12 MED ORDER — CEFAZOLIN SODIUM 1 G IJ SOLR
INTRAMUSCULAR | Status: AC
  Filled 2016-03-12: qty 10

## 2016-03-12 MED ORDER — PHENYLEPHRINE HCL 10 MG/ML IJ SOLN
INTRAVENOUS | Status: DC | PRN
  Administered 2016-03-12: 20 ug/min via INTRAVENOUS

## 2016-03-12 MED ORDER — LEVOCETIRIZINE DIHYDROCHLORIDE 5 MG PO TABS: 5.0000 mg | ORAL_TABLET | Freq: Every evening | ORAL | Status: DC

## 2016-03-12 MED ORDER — FENTANYL CITRATE (PF) 100 MCG/2ML IJ SOLN
INTRAMUSCULAR | Status: DC | PRN
  Administered 2016-03-12 (×2): 50 ug via INTRAVENOUS
  Administered 2016-03-12: 25 ug via INTRAVENOUS
  Administered 2016-03-12: 50 ug via INTRAVENOUS
  Administered 2016-03-12: 25 ug via INTRAVENOUS
  Administered 2016-03-12 (×2): 50 ug via INTRAVENOUS

## 2016-03-12 MED ORDER — EPHEDRINE 5 MG/ML INJ
INTRAVENOUS | Status: AC
  Filled 2016-03-12: qty 10

## 2016-03-12 MED ORDER — CEFAZOLIN SODIUM-DEXTROSE 2-4 GM/100ML-% IV SOLN
2.0000 g | INTRAVENOUS | Status: AC
  Administered 2016-03-12: 3 g via INTRAVENOUS

## 2016-03-12 MED ORDER — SPIRONOLACTONE 25 MG PO TABS
12.5000 mg | ORAL_TABLET | Freq: Every day | ORAL | Status: DC
  Administered 2016-03-12 – 2016-03-13 (×2): 12.5 mg via ORAL
  Filled 2016-03-12 (×2): qty 1

## 2016-03-12 MED ORDER — SENNA 8.6 MG PO TABS
1.0000 | ORAL_TABLET | Freq: Two times a day (BID) | ORAL | Status: DC
  Administered 2016-03-12 – 2016-03-13 (×3): 8.6 mg via ORAL
  Filled 2016-03-12 (×3): qty 1

## 2016-03-12 MED ORDER — MONTELUKAST SODIUM 10 MG PO TABS
10.0000 mg | ORAL_TABLET | Freq: Every day | ORAL | Status: DC
  Administered 2016-03-12: 10 mg via ORAL
  Filled 2016-03-12: qty 1

## 2016-03-12 SURGICAL SUPPLY — 92 items
4.0mm non-locking screw ×2 IMPLANT
BANDAGE ESMARK 6X9 LF (GAUZE/BANDAGES/DRESSINGS) ×1 IMPLANT
BIT DRILL 2.7MM (BIT) ×1
BLADE LONG MED 31MMX9MM (MISCELLANEOUS) ×1
BLADE LONG MED 31X9 (MISCELLANEOUS) ×1 IMPLANT
BLADE SAW SGTL HD 18.5X60.5X1. (BLADE) ×3 IMPLANT
BLADE SURG 10 STRL SS (BLADE) ×3 IMPLANT
BNDG CMPR 9X6 STRL LF SNTH (GAUZE/BANDAGES/DRESSINGS) ×1
BNDG COHESIVE 4X5 TAN STRL (GAUZE/BANDAGES/DRESSINGS) ×3 IMPLANT
BNDG COHESIVE 6X5 TAN STRL LF (GAUZE/BANDAGES/DRESSINGS) ×3 IMPLANT
BNDG ESMARK 6X9 LF (GAUZE/BANDAGES/DRESSINGS) ×3
CANISTER SUCT 3000ML PPV (MISCELLANEOUS) ×3 IMPLANT
CHLORAPREP W/TINT 10.5 ML (MISCELLANEOUS) ×3 IMPLANT
CHLORAPREP W/TINT 26ML (MISCELLANEOUS) ×3 IMPLANT
CLOSURE WOUND 1/2 X4 (GAUZE/BANDAGES/DRESSINGS) ×1
CONT SPECI 4OZ STER CLIK (MISCELLANEOUS) ×2 IMPLANT
COVER SURGICAL LIGHT HANDLE (MISCELLANEOUS) ×3 IMPLANT
CUFF TOURNIQUET SINGLE 34IN LL (TOURNIQUET CUFF) ×1 IMPLANT
CUFF TOURNIQUET SINGLE 44IN (TOURNIQUET CUFF) ×2 IMPLANT
DRAPE C-ARM 42X72 X-RAY (DRAPES) ×1 IMPLANT
DRAPE C-ARMOR (DRAPES) ×1 IMPLANT
DRAPE INCISE IOBAN 66X45 STRL (DRAPES) ×3 IMPLANT
DRAPE U-SHAPE 47X51 STRL (DRAPES) ×3 IMPLANT
DRSG ADAPTIC 3X8 NADH LF (GAUZE/BANDAGES/DRESSINGS) IMPLANT
DRSG MEPITEL 4X7.2 (GAUZE/BANDAGES/DRESSINGS) ×4 IMPLANT
DRSG PAD ABDOMINAL 8X10 ST (GAUZE/BANDAGES/DRESSINGS) IMPLANT
DRSG TEGADERM 4X4.75 (GAUZE/BANDAGES/DRESSINGS) ×3 IMPLANT
ELECT CAUTERY BLADE 6.4 (BLADE) ×2 IMPLANT
ELECT REM PT RETURN 9FT ADLT (ELECTROSURGICAL) ×3
ELECTRODE REM PT RTRN 9FT ADLT (ELECTROSURGICAL) ×1 IMPLANT
GAUZE SPONGE 2X2 8PLY STRL LF (GAUZE/BANDAGES/DRESSINGS) ×1 IMPLANT
GAUZE SPONGE 4X4 12PLY STRL (GAUZE/BANDAGES/DRESSINGS) IMPLANT
GLOVE BIO SURGEON STRL SZ8 (GLOVE) ×3 IMPLANT
GLOVE BIOGEL PI IND STRL 8 (GLOVE) ×2 IMPLANT
GLOVE BIOGEL PI INDICATOR 8 (GLOVE) ×4
GLOVE ECLIPSE 7.5 STRL STRAW (GLOVE) ×6 IMPLANT
GOWN STRL REUS W/ TWL LRG LVL3 (GOWN DISPOSABLE) ×1 IMPLANT
GOWN STRL REUS W/ TWL XL LVL3 (GOWN DISPOSABLE) ×2 IMPLANT
GOWN STRL REUS W/TWL LRG LVL3 (GOWN DISPOSABLE) ×3
GOWN STRL REUS W/TWL XL LVL3 (GOWN DISPOSABLE) ×6
GUIDE DRILL 45 F/2.7 THRD TIP (BIT) ×1 IMPLANT
GUIDE DRILL 65 F/3.2 THRD TIP (BIT) ×1 IMPLANT
K-WIRE FX150X2X2 TROC KRSH (WIRE) ×4
K-WIRE TROCAR 2X150 (WIRE) ×12
KIT BASIN OR (CUSTOM PROCEDURE TRAY) ×3 IMPLANT
KIT ROOM TURNOVER OR (KITS) ×3 IMPLANT
KWIRE FX150X2X2 TROC KRSH (WIRE) IMPLANT
MILL MEDIUM DISP (BLADE) ×2 IMPLANT
NEEDLE 22X1 1/2 (OR ONLY) (NEEDLE) IMPLANT
NS IRRIG 1000ML POUR BTL (IV SOLUTION) ×3 IMPLANT
PACK ORTHO EXTREMITY (CUSTOM PROCEDURE TRAY) ×3 IMPLANT
PAD ABD 8X10 STRL (GAUZE/BANDAGES/DRESSINGS) ×2 IMPLANT
PAD ARMBOARD 7.5X6 YLW CONV (MISCELLANEOUS) ×6 IMPLANT
PAD CAST 4YDX4 CTTN HI CHSV (CAST SUPPLIES) ×1 IMPLANT
PADDING CAST ABS 4INX4YD NS (CAST SUPPLIES) ×2
PADDING CAST ABS 6INX4YD NS (CAST SUPPLIES) ×2
PADDING CAST ABS COTTON 4X4 ST (CAST SUPPLIES) IMPLANT
PADDING CAST ABS COTTON 6X4 NS (CAST SUPPLIES) IMPLANT
PADDING CAST COTTON 4X4 STRL (CAST SUPPLIES) ×3
PIN GUIDE DRILL TIP 2.8X300 (DRILL) ×2 IMPLANT
PIN W-SCR THD 4.0 AO 140 (PIN) ×2 IMPLANT
SCREW TI LOCK 40X26 TX10 (Screw) ×2 IMPLANT
SCREW TI LOCK 40X30 TX10 (Screw) ×2 IMPLANT
SCREW TI LOCK 40X34 TX10 (Screw) ×6 IMPLANT
SCREW TI LOCK 40X36 TX10 (Screw) ×2 IMPLANT
SCREW TI STD 40X26 TX10 (Screw) ×2 IMPLANT
SCREW TI STD 40X30 TX10 (Screw) ×2 IMPLANT
SCREW TI STD 40X34 TX10 (Screw) ×2 IMPLANT
SCREW TI STD 40X36 TX10 (Screw) ×4 IMPLANT
SET CYSTO W/LG BORE CLAMP LF (SET/KITS/TRAYS/PACK) ×2 IMPLANT
SOAP 2 % CHG 4 OZ (WOUND CARE) ×3 IMPLANT
SPONGE GAUZE 2X2 STER 10/PKG (GAUZE/BANDAGES/DRESSINGS) ×2
SPONGE GAUZE 4X4 12PLY STER LF (GAUZE/BANDAGES/DRESSINGS) ×2 IMPLANT
SPONGE LAP 18X18 X RAY DECT (DISPOSABLE) ×3 IMPLANT
STAPLER VISISTAT 35W (STAPLE) IMPLANT
STRIP CLOSURE SKIN 1/2X4 (GAUZE/BANDAGES/DRESSINGS) ×2 IMPLANT
SUCTION FRAZIER HANDLE 10FR (MISCELLANEOUS) ×2
SUCTION TUBE FRAZIER 10FR DISP (MISCELLANEOUS) ×1 IMPLANT
SUT ETHILON 3 0 FSL (SUTURE) ×4 IMPLANT
SUT MNCRL AB 3-0 PS2 18 (SUTURE) ×2 IMPLANT
SUT PROLENE 3 0 PS 2 (SUTURE) ×3 IMPLANT
SUT VIC AB 2-0 CT1 27 (SUTURE) ×6
SUT VIC AB 2-0 CT1 TAPERPNT 27 (SUTURE) ×2 IMPLANT
SUT VIC AB 3-0 PS2 18 (SUTURE) ×7 IMPLANT
SUT VIC AB 3-0 PS2 18XBRD (SUTURE) ×1 IMPLANT
SYR CONTROL 10ML LL (SYRINGE) IMPLANT
TOWEL OR 17X24 6PK STRL BLUE (TOWEL DISPOSABLE) ×3 IMPLANT
TOWEL OR 17X26 10 PK STRL BLUE (TOWEL DISPOSABLE) ×3 IMPLANT
TUBE CONNECTING 12'X1/4 (SUCTIONS) ×1
TUBE CONNECTING 12X1/4 (SUCTIONS) ×2 IMPLANT
WATER STERILE IRR 1000ML POUR (IV SOLUTION) ×3 IMPLANT
ankle fix plate standard ×2 IMPLANT
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
## 2016-03-12 NOTE — Brief Op Note (Signed)
03/12/2016  10:18 AM  PATIENT:  [name]  65 y.o. female  PRE-OPERATIVE DIAGNOSIS:  RIGHT ANKLE NONUNION AND HARDWARE FAILURE  POST-OPERATIVE DIAGNOSIS:  RIGHT ANKLE NONUNION AND HARDWARE FAILURE  Procedure(s): 1.  Removal of deep implants right fibula 2.  Removal of deep implants right tibia (separate incision) 3.  Right ankle arthrodesis 4.  Autograft bone from fibula and medial malleolus to ankle joint 5.  AP and lateral xrays of the right ankle  SURGEON:  Toni ArthursJohn Koron Godeaux, MD  ASSISTANT: Alfredo MartinezJustin Ollis, PA-C  ANESTHESIA:   General, regional  EBL:  minimal   TOURNIQUET:   Total Tourniquet Time Documented: Thigh (Right) - 125 minutes Total: Thigh (Right) - 125 minutes  COMPLICATIONS:  None apparent  DISPOSITION:  Extubated, awake and stable to recovery.  DICTATION ID:  [id number]

## 2016-03-12 NOTE — Anesthesia Procedure Notes (Signed)
Procedure Name: Intubation Date/Time: 03/12/2016 7:27 AM Performed by: Rise PatienceBELL, Neilah Fulwider T Pre-anesthesia Checklist: Patient identified, Emergency Drugs available, Suction available and Patient being monitored Patient Re-evaluated:Patient Re-evaluated prior to inductionOxygen Delivery Method: Circle System Utilized Preoxygenation: Pre-oxygenation with 100% oxygen Intubation Type: IV induction Ventilation: Mask ventilation with difficulty and Oral airway inserted - appropriate to patient size Laryngoscope Size: Glidescope and 4 Grade View: Grade I Tube type: Oral Tube size: 7.5 mm Number of attempts: 1 Airway Equipment and Method: Stylet,  Oral airway and Video-laryngoscopy Placement Confirmation: ETT inserted through vocal cords under direct vision,  positive ETCO2 and breath sounds checked- equal and bilateral Secured at: 22 cm Tube secured with: Tape Dental Injury: Teeth and Oropharynx as per pre-operative assessment

## 2016-03-12 NOTE — Anesthesia Postprocedure Evaluation (Signed)
Anesthesia Post Note  Patient: [name]  Procedure(s) Performed: Procedure(s) (LRB): RIGHT ANKLE REMOVAL OF DEEP IMPLANTS MEDIAL AND LATERAL,RIGHT ANKLE ARTHRODEDESIS (Right)  Patient location during evaluation: PACU Anesthesia Type: General Level of consciousness: awake and alert Pain management: pain level controlled Vital Signs Assessment: post-procedure vital signs reviewed and stable Respiratory status: spontaneous breathing, nonlabored ventilation, respiratory function stable and patient connected to nasal cannula oxygen Cardiovascular status: blood pressure returned to baseline and stable Postop Assessment: no signs of nausea or vomiting Anesthetic complications: no    Last Vitals:  Vitals:   03/12/16 1212 03/12/16 1243  BP: (!) 137/49 (!) 142/54  Pulse: 86 83  Resp: (!) 21   Temp:  36.6 C    Last Pain:  Vitals:   03/12/16 1325  TempSrc:   PainSc: 6                  Kemaya Dorner,JAMES TERRILL

## 2016-03-12 NOTE — Anesthesia Preprocedure Evaluation (Addendum)
Anesthesia Evaluation  Patient identified by MRN, date of birth, ID band Patient awake    Reviewed: Allergy & Precautions, NPO status , Patient's Chart, lab work & pertinent test results  Airway Mallampati: III  TM Distance: >3 FB Neck ROM: Full    Dental  (+) Dental Advisory Given, Teeth Intact   Pulmonary shortness of breath and with exertion, asthma , sleep apnea and Continuous Positive Airway Pressure Ventilation , former smoker,     + decreased breath sounds      Cardiovascular hypertension, Pt. on medications + Peripheral Vascular Disease   Rhythm:Regular Rate:Normal     Neuro/Psych  Headaches,    GI/Hepatic GERD  Medicated,  Endo/Other  Morbid obesity  Renal/GU      Musculoskeletal  (+) Arthritis ,   Abdominal (+) + obese,   Peds  Hematology   Anesthesia Other Findings   Reproductive/Obstetrics                           Anesthesia Physical Anesthesia Plan  ASA: III  Anesthesia Plan: General   Post-op Pain Management:    Induction: Intravenous  Airway Management Planned: Oral ETT  Additional Equipment:   Intra-op Plan:   Post-operative Plan: Extubation in OR  Informed Consent: I have reviewed the patients History and Physical, chart, labs and discussed the procedure including the risks, benefits and alternatives for the proposed anesthesia with the patient or authorized representative who has indicated his/her understanding and acceptance.   Dental advisory given  Plan Discussed with: CRNA, Anesthesiologist and Surgeon  Anesthesia Plan Comments:         Anesthesia Quick Evaluation

## 2016-03-12 NOTE — Transfer of Care (Signed)
Immediate Anesthesia Transfer of Care Note  Patient: [name]  Procedure(s) Performed: Procedure(s): RIGHT ANKLE REMOVAL OF DEEP IMPLANTS MEDIAL AND LATERAL,RIGHT ANKLE ARTHRODEDESIS (Right)  Patient Location: PACU  Anesthesia Type:General  Level of Consciousness: awake, alert  and oriented  Airway & Oxygen Therapy: Patient Spontanous Breathing and Patient connected to face mask oxygen  Post-op Assessment: Report given to RN, Post -op Vital signs reviewed and stable and Patient moving all extremities X 4  Post vital signs: Reviewed and stable  Last Vitals:  Vitals:   03/12/16 0616  BP: (!) 131/43  Pulse: 70  Resp: 18  Temp: 36.8 C    Last Pain:  Vitals:   03/12/16 0630  TempSrc:   PainSc: 4       Patients Stated Pain Goal: 2 (03/12/16 0630)  Complications: No apparent anesthesia complications

## 2016-03-13 MED ORDER — ONDANSETRON HCL 4 MG PO TABS: 4.0000 mg | ORAL_TABLET | Freq: Every day | ORAL | 1 refills | Status: DC | PRN

## 2016-03-13 MED ORDER — DOCUSATE SODIUM 100 MG PO CAPS: 100.0000 mg | ORAL_CAPSULE | Freq: Two times a day (BID) | ORAL | 0 refills | Status: DC

## 2016-03-13 MED ORDER — SENNA 8.6 MG PO TABS: 2.0000 | ORAL_TABLET | Freq: Two times a day (BID) | ORAL | 0 refills | Status: DC

## 2016-03-13 MED ORDER — OXYCODONE HCL 5 MG PO TABS: 5.0000 mg | ORAL_TABLET | ORAL | 0 refills | Status: DC | PRN

## 2016-03-13 MED ORDER — ASPIRIN EC 81 MG PO TBEC: 81.0000 mg | DELAYED_RELEASE_TABLET | Freq: Two times a day (BID) | ORAL | 0 refills | Status: AC

## 2016-03-13 NOTE — Evaluation (Signed)
Occupational Therapy Evaluation and Discharge Patient Details Name: [name] MRN: [id number] DOB: Feb 15, 1951 Today's Date: 03/13/2016    History of Present Illness Patient is a 65 yo female admitted 03/12/16 with non-union fx Rt ankle (ORIF in February).  Patient s/p removal of implants and arthrodesis of Rt ankle non-union fx.    PMH:  sleep apnea; RLS; syncope; morbid obesity; seizure disorder; HTN; bilateral LE edema; GERD; hyperlipidemia   Clinical Impression   PTA Pt min A for ADL and dependent in IADL. Pt currently min A for LB ADL and min guard for transfers. Pt able to maintain NWB for [facility] to [facility]. Pt with below deficits, but at adequate level (baseline level ADL) for dc with supportive family. Pt with no questions or concerns going home in self care tasks. No OT follow up at this time.     Follow Up Recommendations  No OT follow up;Supervision - Intermittent    Equipment Recommendations  None recommended by OT    Recommendations for Other Services       Precautions / Restrictions Precautions Precautions: Fall Required Braces or Orthoses: Other Brace/Splint Other Brace/Splint: Short-leg cast/splint Restrictions Weight Bearing Restrictions: Yes RLE Weight Bearing: Non weight bearing      Mobility Bed Mobility               General bed mobility comments: Pt OOB on BSC when OT entered room  Transfers Overall transfer level: Needs assistance Equipment used: Rolling walker (2 wheeled) Transfers: Stand Pivot Transfers;Sit to/from Stand Sit to Stand: Min assist Stand pivot transfers: Min assist       General transfer comment: MIn assist to steady and hold RW steady, vc for direction on where to sit, and to maintain NWB (Pt said she was)    Balance Overall balance assessment: Needs assistance Sitting-balance support: No upper extremity supported;Feet supported Sitting balance-Leahy Scale: Good     Standing balance support: Bilateral upper  extremity supported;During functional activity Standing balance-Leahy Scale: Poor Standing balance comment: Requires UE support for balance while maintaining NWB RLE.                            ADL Overall ADL's : Needs assistance/impaired Eating/Feeding: Modified independent;Sitting   Grooming: Wash/dry hands;Set up;Sitting   Upper Body Bathing: Set up;Sitting;With caregiver independent assisting Upper Body Bathing Details (indicate cue type and reason): sponge bath Lower Body Bathing: Minimal assistance;With caregiver independent assisting;Sitting/lateral leans Lower Body Bathing Details (indicate cue type and reason): sponge bath       Lower Body Dressing Details (indicate cue type and reason): Discussed with Pt and educated on sequence of dressing  Toilet Transfer: Min Barrister's clerkguard;Stand-pivot;BSC Toilet Transfer Details (indicate cue type and reason): able to maintain NWB during transfer Toileting- ArchitectClothing Manipulation and Hygiene: Min guard;Sit to/from stand Toileting - Clothing Manipulation Details (indicate cue type and reason): hospital gown       General ADL Comments: Pt has needed assist for ADL for some time, big change is NWB status. Pt has appropriate equipment and OT suggested a reacher/grabber to assist as well     Vision     Perception     Praxis      Pertinent Vitals/Pain Pain Assessment: 0-10 Pain Score: 9  Pain Location: Right Ankle Pain Descriptors / Indicators: Aching;Throbbing;Sore Pain Intervention(s): Monitored during session;Repositioned;Limited activity within patient's tolerance     Hand Dominance Left   Extremity/Trunk Assessment Upper Extremity Assessment Upper  Extremity Assessment: Overall WFL for tasks assessed   Lower Extremity Assessment Lower Extremity Assessment: RLE deficits/detail RLE Deficits / Details: Short-leg cast.  Able to move RLE against gravity with at least 3/5 strength.       Communication  Communication Communication: No difficulties   Cognition Arousal/Alertness: Awake/alert Behavior During Therapy: WFL for tasks assessed/performed Overall Cognitive Status: Within Functional Limits for tasks assessed                     General Comments       Exercises       Shoulder Instructions      Home Living Family/patient expects to be discharged to:: Private residence Living Arrangements: Spouse/significant other Available Help at Discharge: Family;Neighbor;Available 24 hours/day Type of Home: House Home Access: Ramped entrance     Home Layout: Two level;Able to live on main level with bedroom/bathroom Alternate Level Stairs-Number of Steps: flight Alternate Level Stairs-Rails: Right;Left;Can reach both Bathroom Shower/Tub: Tub/shower unit Shower/tub characteristics: Engineer, building servicesCurtain Bathroom Toilet: Standard Bathroom Accessibility: No (wheelchair will not fit through door, walker will) How Accessible: Accessible via walker Home Equipment: Walker - 2 wheels;Bedside commode;Shower seat;Tub bench;Hand held shower head;Wheelchair - Geophysical data processormanual;Transport chair          Prior Functioning/Environment Level of Independence: Independent with assistive device(s);Needs assistance  Gait / Transfers Assistance Needed: Patient was transferring to/from w/c on her own, but with weight bearing allowed on RLE.  Used RW minimally for short gait distances. ADL's / Homemaking Assistance Needed: Assist for bathing, meal prep, housekeeping.   Comments: dependent in IADL,  min A with ADL for speed        OT Problem List: Decreased range of motion;Decreased activity tolerance;Obesity;Decreased knowledge of use of DME or AE;Pain   OT Treatment/Interventions:      OT Goals(Current goals can be found in the care plan section) Acute Rehab OT Goals Patient Stated Goal: To get home so she can craft OT Goal Formulation: With patient Time For Goal Achievement: 03/20/16 Potential to Achieve  Goals: Good  OT Frequency:     Barriers to D/C:            Co-evaluation              End of Session Equipment Utilized During Treatment: Rolling walker Nurse Communication: Mobility status;Weight bearing status  Activity Tolerance: Patient tolerated treatment well Patient left: in chair;with call bell/phone within reach   Time: 0832-0916 OT Time Calculation (min): 44 min Charges:  OT General Charges $OT Visit: 1 Procedure OT Evaluation $OT Eval Moderate Complexity: 1 Procedure OT Treatments $Self Care/Home Management : 23-37 mins G-Codes:    Evern BioLaura J Maude Hettich 03/13/2016, 3:46 PM  Sherryl MangesLaura Kaye Mitro OTR/L [phone]

## 2016-03-13 NOTE — Progress Notes (Signed)
Subjective: 1 Day Post-Op Procedure(s) (LRB): RIGHT ANKLE REMOVAL OF DEEP IMPLANTS MEDIAL AND LATERAL,RIGHT ANKLE ARTHRODEDESIS (Right)  Patient reports pain as mild to moderate.  Denies fever, chills, N/V.  Reports that she was able to rest well, with a little more discomfort this am.  Tolerating POs well.  Denies BM, however admits to flatulence.  States that she believes she will be ready to go home this afternoon.  Objective:   VITALS:  Temp:  [97.8 F (36.6 C)-99 F (37.2 C)] 98.4 F (36.9 C) (11/03 0430) Pulse Rate:  [68-95] 68 (11/03 0430) Resp:  [12-21] 18 (11/03 0430) BP: (113-148)/(46-80) 136/47 (11/03 0430) SpO2:  [81 %-100 %] 100 % (11/03 0430)  General: WDWN patient in NAD. Psych:  Appropriate mood and affect. Neuro:  A&O x 3, Moving all extremities, sensation intact to light touch HEENT:  EOMs intact Chest:  Even non-labored respirations Skin:  Posterior splint/dressing C/D/I, no rashes or lesions Extremities: warm/dry, no visible edema, erythema, or echymosis.  No lymphadenopathy. Pulses: Popliteus 2+ MSK:  ROM: EHL/FHL intact, MMT: patient is able to perform quad set    LABS  Recent Labs  03/12/16 0626  HGB 12.8  WBC 7.6  PLT 201    Recent Labs  03/12/16 0626  NA 137  K 3.7  CL 106  CO2 22  BUN 20  CREATININE 0.74  GLUCOSE 97   No results for input(s): LABPT, INR in the last 72 hours.   Assessment/Plan: 1 Day Post-Op Procedure(s) (LRB): RIGHT ANKLE REMOVAL OF DEEP IMPLANTS MEDIAL AND LATERAL,RIGHT ANKLE ARTHRODEDESIS (Right)  Up with therapy  NWB RLE Plan for D/C this afternoon.  If patient changes her mind and feels as though she is not ready please call and I will change the order and plan for D/C tomorrow. Scripts on chart Plan for 2 week outpatient post-op visit with Dr. Lorrin JacksonHewitt  Justin Ollis, PA-C, ATC [facility] Office:  [phone]

## 2016-03-13 NOTE — Evaluation (Signed)
Physical Therapy Evaluation Patient Details Name: [name] MRN: [id number] DOB: 07-Oct-1950 Today's Date: 03/13/2016   History of Present Illness  Patient is a 65 yo female admitted 03/12/16 with non-union fx Rt ankle (ORIF in February).  Patient s/p removal of implants and arthrodesis of Rt ankle non-union fx.    PMH:  sleep apnea; RLS; syncope; morbid obesity; seizure disorder; HTN; bilateral LE edema; GERD; hyperlipidemia    Clinical Impression  Patient presents with problems listed below.  Patient will function at w/c level at home while NWB on RLE.  Has all equipment needed.  Husband available and able to assist patient.  Patient able to transfer with min assist and maintain NWB RLE.  No further PT needed at this time.  Patient ready for d/c from PT perspective.    Follow Up Recommendations No PT follow up;Supervision for mobility/OOB    Equipment Recommendations  None recommended by PT    Recommendations for Other Services       Precautions / Restrictions Precautions Precautions: Fall Required Braces or Orthoses: Other Brace/Splint Other Brace/Splint: Short-leg cast/splint Restrictions Weight Bearing Restrictions: Yes RLE Weight Bearing: Non weight bearing      Mobility  Bed Mobility               General bed mobility comments: Patient in chair.  Sleeps in recliner.  Transfers Overall transfer level: Needs assistance Equipment used: None;Rolling walker (2 wheeled) Transfers: Sit to/from UGI CorporationStand;Stand Pivot Transfers Sit to Stand: Min assist Stand pivot transfers: Min assist       General transfer comment: Verbal cues for technique and to maintain NWB on RLE.  Instructed patient to transfer toward good (left) side when able.  Min assist to steady and for support during pivot.  Patient able to transfer recliner > bed, bed > recliner, and recliner <> BSC with min assist.  Was able to maintain NWB on RLE.  Ambulation/Gait Ambulation/Gait assistance: Min  assist Ambulation Distance (Feet): 1 Feet Assistive device: Rolling walker (2 wheeled) Gait Pattern/deviations:  (Scooting LLE along floor)     General Gait Details: Instructed patient on gait using RW and NWB on RLE.  Patient unable to take step with LLE and maintain NWB on RLE.  Scooted LLE along floor.  Stairs            Wheelchair Mobility    Modified Rankin (Stroke Patients Only)       Balance Overall balance assessment: Needs assistance         Standing balance support: Bilateral upper extremity supported Standing balance-Leahy Scale: Poor Standing balance comment: Requires UE support for balance while maintaining NWB RLE.                             Pertinent Vitals/Pain Pain Assessment: 0-10 Pain Score: 8  Pain Location: Rt ankle Pain Descriptors / Indicators: Aching;Throbbing;Sore Pain Intervention(s): Monitored during session;Premedicated before session;Repositioned    Home Living Family/patient expects to be discharged to:: Private residence Living Arrangements: Spouse/significant other Available Help at Discharge: Family;Neighbor;Available 24 hours/day Type of Home: House Home Access: Ramped entrance     Home Layout: Two level;Able to live on main level with bedroom/bathroom Home Equipment: Dan HumphreysWalker - 2 wheels;Bedside commode;Shower seat;Tub bench;Hand held shower head;Wheelchair - Geophysical data processormanual;Transport chair      Prior Function Level of Independence: Independent with assistive device(s);Needs assistance   Gait / Transfers Assistance Needed: Patient was transferring to/from w/c on her own, but  with weight bearing allowed on RLE.  Used RW minimally for short gait distances.  ADL's / Homemaking Assistance Needed: Assist for bathing, meal prep, housekeeping.  Comments: dependent in IADL,  min A with ADL for speed     Hand Dominance   Dominant Hand: Left    Extremity/Trunk Assessment   Upper Extremity Assessment: Overall WFL for tasks  assessed           Lower Extremity Assessment: RLE deficits/detail RLE Deficits / Details: Short-leg cast.  Able to move RLE against gravity with at least 3/5 strength.       Communication   Communication: No difficulties  Cognition Arousal/Alertness: Awake/alert Behavior During Therapy: WFL for tasks assessed/performed Overall Cognitive Status: Within Functional Limits for tasks assessed                      General Comments      Exercises     Assessment/Plan    PT Assessment Patent does not need any further PT services  PT Problem List            PT Treatment Interventions      PT Goals (Current goals can be found in the Care Plan section)  Acute Rehab PT Goals PT Goal Formulation: All assessment and education complete, DC therapy    Frequency     Barriers to discharge        Co-evaluation               End of Session Equipment Utilized During Treatment: Gait belt Activity Tolerance: Patient tolerated treatment well;Patient limited by pain Patient left: in chair;with call bell/phone within reach;with family/visitor present Nurse Communication: Mobility status (Ready for d/c from PT perspective.)         Time: [id number] PT Time Calculation (min) (ACUTE ONLY): 29 min   Charges:   PT Evaluation $PT Eval Low Complexity: 1 Procedure PT Treatments $Therapeutic Activity: 8-22 mins   PT G Codes:        Vena AustriaDavis, Jiayi Lengacher H 03/13/2016, 11:50 AM Durenda HurtSusan H. Renaldo Fiddleravis, PT, [facility] Acute Rehab Services Pager [phone]

## 2016-03-13 NOTE — Progress Notes (Signed)
Discharge instructions given. Pt verbalized understanding and all questions were answered.  

## 2016-03-13 NOTE — Discharge Instructions (Signed)
John Hewitt, MD °[facility] ° °Please read the following information regarding your care after surgery. ° °Medications  °You only need a prescription for the narcotic pain medicine (ex. oxycodone, Percocet, Norco).  All of the other medicines listed below are available over the counter. °X acetominophen (Tylenol) 650 mg every 4-6 hours as you need for minor pain °X oxycodone as prescribed for moderate to severe pain °?  ° °Narcotic pain medicine (ex. oxycodone, Percocet, Vicodin) will cause constipation.  To prevent this problem, take the following medicines while you are taking any pain medicine. °X docusate sodium (Colace) 100 mg twice a day X senna (Senokot) 2 tablets twice a day ° °X To help prevent blood clots, take a baby aspirin (81 mg) twice a day after surgery until you are allowed to bear weight on your operative extremity.  You should also get up every hour while you are awake to move around.   ° °Weight Bearing °X Do not bear any weight on the operated leg or foot. ° °Cast / Splint / Dressing °X Keep your splint or cast clean and dry.  Don’t put anything (coat hanger, pencil, etc) down inside of it.  If it gets damp, use a hair dryer on the cool setting to dry it.  If it gets soaked, call the office to schedule an appointment for a cast change. ° ° °After your dressing, cast or splint is removed; you may shower, but do not soak or scrub the wound.  Allow the water to run over it, and then gently pat it dry. ° °Swelling °It is normal for you to have swelling where you had surgery.  To reduce swelling and pain, keep your toes above your nose for at least 3 days after surgery.  It may be necessary to keep your foot or leg elevated for several weeks.  If it hurts, it should be elevated. ° °Follow Up °Call my office at [phone] when you are discharged from the hospital or surgery center to schedule an appointment to be seen two weeks after surgery. ° °Call my office at [phone] if you  develop a fever >101.5° F, nausea, vomiting, bleeding from the surgical site or severe pain.   ° ° °

## 2016-03-13 NOTE — Discharge Summary (Signed)
Physician Discharge Summary  Patient ID: [name] MRN: [id number] DOB/AGE: Apr 12, 1951 65 y.o.  Admit date: 03/12/2016 Discharge date: 03/13/2016  Admission Diagnoses: R ankle fracture nonunion; sleep apnea; RLS; syncope; morbid obesity; seizure disorder; HTN; bilateral LE edema; GERD; hyperlipidemia; anosmia; leukocytosis; nocturia  Discharge Diagnoses:  Active Problems:   S/P ankle arthrodesis same as above  Discharged Condition: stable  Hospital Course: Patient [facility] on 03/12/16 for elective R ankle hardware removal and ankle arthrodesis by Dr. Toni ArthursJohn Hewitt.  The patient tolerated the procedure well without complications.  The patient was then admitted to the hospital.  She successfully worked with therapy.  She is to be D/C'd home on 03/14/16.  She tolerated her stay well.   Consults: PT/OT/case management  Significant Diagnostic Studies: radiology: X-Ray: to ensure satisfactory anatomic alignment during operative procedure.  Treatments: IV hydration, antibiotics: Ancef, analgesia: acetaminophen, acetaminophen w/ codeine and Morphine, cardiac meds: amlodipine, furosemide and spironolactone, anticoagulation: lovenox and surgery: as stated above  Discharge Exam: Blood pressure (!) 136/47, pulse 68, temperature 98.4 F (36.9 C), temperature source Oral, resp. rate 18, height 5\' 2"  (1.575 m), weight 122.5 kg (270 lb), SpO2 100 %. General: WDWN patient in NAD. Psych:  Appropriate mood and affect. Neuro:  A&O x 3, Moving all extremities, sensation intact to light touch HEENT:  EOMs intact Chest:  Even non-labored respirations Skin:  Dressing/posterior splint C/D/I, no rashes or lesions Extremities: warm/dry, no visible edema, erythema, or echymosis.  No lymphadenopathy. Pulses: Popliteus 2+ MSK:  ROM: EHL/FHL intact, MMT: patient is able to perform quad set   Disposition: 06-Home-Health Care Svc  Discharge Instructions    Call MD / Call 911    Complete  by:  As directed    If you experience chest pain or shortness of breath, CALL 911 and be transported to the hospital emergency room.  If you develope a fever above 101 F, pus (white drainage) or increased drainage or redness at the wound, or calf pain, call your surgeon's office.   Constipation Prevention    Complete by:  As directed    Drink plenty of fluids.  Prune juice may be helpful.  You may use a stool softener, such as Colace (over the counter) 100 mg twice a day.  Use MiraLax (over the counter) for constipation as needed.   Diet - low sodium heart healthy    Complete by:  As directed    Increase activity slowly as tolerated    Complete by:  As directed    Non weight bearing    Complete by:  As directed    Laterality:  right   Extremity:  Lower       Medication List    STOP taking these medications   aspirin 81 MG tablet Replaced by:  aspirin EC 81 MG tablet     TAKE these medications   acetaminophen 500 MG tablet Commonly known as:  TYLENOL Take 1,500 mg by mouth 4 (four) times daily as needed for moderate pain or headache.   albuterol 108 (90 Base) MCG/ACT inhaler Commonly known as:  PROVENTIL HFA;VENTOLIN HFA Inhale 2 puffs into the lungs every 4 (four) hours as needed for wheezing.   amLODipine 10 MG tablet Commonly known as:  NORVASC Take 10 mg by mouth every evening.   aspirin EC 81 MG tablet Take 1 tablet (81 mg total) by mouth 2 (two) times daily. Replaces:  aspirin 81 MG tablet   Biotin 5000 MCG  Caps Take 5,000 mcg by mouth 2 (two) times daily.   CALCIUM 600+D PO Take 1 tablet by mouth 2 (two) times daily.   cholecalciferol 1000 units tablet Commonly known as:  VITAMIN D Take 6,000 Units by mouth daily.   docusate sodium 100 MG capsule Commonly known as:  COLACE Take 1 capsule (100 mg total) by mouth 2 (two) times daily. While taking narcotic pain medicine.   doxycycline 40 MG capsule Commonly known as:  ORACEA Take 40 mg by mouth every  morning.   DRISTAN NA Place 1 spray into the nose daily as needed (allergies).   ferrous sulfate 325 (65 FE) MG tablet Take 325 mg by mouth 2 (two) times daily with a meal.   fluticasone 50 MCG/ACT nasal spray Commonly known as:  FLONASE Place 2 sprays into the nose 2 (two) times daily.   furosemide 40 MG tablet Commonly known as:  LASIX Take 1 tablet (40 mg total) by mouth daily.   HORIZANT 600 MG Tbcr Generic drug:  Gabapentin Enacarbil Take 900 mg by mouth at bedtime.   levocetirizine 5 MG tablet Commonly known as:  XYZAL Take 5 mg by mouth every evening.   meloxicam 15 MG tablet Commonly known as:  MOBIC Take 15 mg by mouth daily.   montelukast 10 MG tablet Commonly known as:  SINGULAIR Take 10 mg by mouth at bedtime.   nabumetone 750 MG tablet Commonly known as:  RELAFEN Take 750 mg by mouth 2 (two) times daily.   ondansetron 4 MG tablet Commonly known as:  ZOFRAN Take 1 tablet (4 mg total) by mouth daily as needed for nausea or vomiting.   oxyCODONE 5 MG immediate release tablet Commonly known as:  ROXICODONE Take 1-2 tablets (5-10 mg total) by mouth every 4 (four) hours as needed for moderate pain or severe pain.   pantoprazole 40 MG tablet Commonly known as:  PROTONIX Take 40 mg by mouth daily.   PHENobarbital 64.8 MG tablet Commonly known as:  LUMINAL TAKE 2 TABLETS AT BEDTIME   potassium chloride 10 MEQ tablet Commonly known as:  K-DUR Take 1 tablet (10 mEq total) by mouth 2 (two) times daily.   pramipexole 0.25 MG tablet Commonly known as:  MIRAPEX Take 1 tablet (0.25 mg total) by mouth daily after supper.   Pramipexole Dihydrochloride 0.75 MG Tb24 Take 1 tablet (0.75 mg total) by mouth at bedtime.   rosuvastatin 20 MG tablet Commonly known as:  CRESTOR Take 1 tablet (20 mg total) by mouth daily. Reported on 07/02/2015 What changed:  when to take this  additional instructions   senna 8.6 MG Tabs tablet Commonly known as:   SENOKOT Take 2 tablets (17.2 mg total) by mouth 2 (two) times daily.   spironolactone 25 MG tablet Commonly known as:  ALDACTONE Take 12.5 mg by mouth daily.   valsartan 80 MG tablet Commonly known as:  DIOVAN Take 2 tablets (160 mg total) by mouth daily. What changed:  how much to take      Follow-up Information    HEWITT, JOHN, MD. Schedule an appointment as soon as possible for a visit in 2 week(s).   Specialty:  Orthopedic Surgery Contact information: [address] [place] [address] [phone]           Signed: Alfredo MartinezJustin Norrin Shreffler, Cordelia PochePA-C, ATC [facility] Office:  [phone]

## 2016-03-13 NOTE — Op Note (Signed)
NAMLita Mains:  [name], [name]                ACCOUNT NO.:  [id number]  MEDICAL RECORD NO.:  [id number]  LOCATION:  5N20C                        FACILITY:  [facility]  PHYSICIAN:  Toni ArthursJohn Aryelle Figg, MD        DATE OF BIRTH:  Oct 03, 1950  DATE OF PROCEDURE:  03/12/2016 DATE OF DISCHARGE:                              OPERATIVE REPORT   PREOPERATIVE DIAGNOSIS:  Right ankle fracture nonunion and hardware failure.  POSTOPERATIVE DIAGNOSIS:  Right ankle fracture nonunion and hardware failure.  PROCEDURE: 1. Removal of deep implants from the right fibula. 2. Removal of deep implants from the right tibia through a separate     incision. 3. Right ankle arthrodesis. 4. Autograft bone from the fibula and medial malleolus to the ankle     joint. 5. AP and lateral radiographs of the right ankle.  SURGEON:  Toni ArthursJohn Torres Hardenbrook, MD.  ASSISTANT:  Alfredo MartinezJustin Ollis, PA-C.  ANESTHESIA:  General, regional.  ESTIMATED BLOOD LOSS:  Minimal.  TOURNIQUET TIME:  2 hours and 5 minutes at 350 mmHg.  COMPLICATIONS:  None apparent.  DISPOSITION:  Extubated, awake, and stable to recovery.  INDICATIONS FOR PROCEDURE:  The patient is a 65 year old woman with past medical history significant for morbid obesity.  She fractured her ankle in February of this year.  She underwent open reduction and internal fixation of the fracture by Dr. Turner Danielsowan.  She subsequently developed nonunion of both medial malleolus and the fibula with loss of the hardware fixation.  She has developed severe posttraumatic arthritic changes of the ankle.  She presents now for operative treatment of this painful condition.  She understands the risks and benefits of the alternative treatment options and elects surgical treatment.  She specifically understands risks of bleeding, infection, nerve damage, blood clots, need for additional surgery, continued pain, nonunion, amputation, and death.  PROCEDURE IN DETAIL:  After preoperative consent was obtained and  the correct operative site was identified, the patient was brought to the operating room and placed supine on the operating table.  General anesthesia was induced.  Preoperative antibiotics were administered. Surgical time-out was taken.  The right lower extremity was prepped and draped in standard sterile fashion with tourniquet around the thigh. The extremity was exsanguinated and the tourniquet was inflated to 350 mmHg.  The patient's previous lateral incision was made.  Sharp dissection was carried down through skin and subcutaneous tissues. Superficial aspect of the plate was identified.  The plate was cleared of all soft tissues.  The ZipTight washers were cut loose from the underlying suture and removed.  All the screws were then removed in their entirety followed by the plate.  Attention was then turned to the medial aspect of the ankle where the previous incision was again opened sharply.  Dissection was carried down through the skin and subcutaneous tissues.  The medial malleolus fracture fragment was identified.  Both medial malleolar screws were identified.  Both were removed in their entirety.  The medial washers for the ZipTight were identified.  Both were removed along with the suture.  Attention was then turned back to the lateral joint where the distal fibula was skeletonized.  It was cut with an oscillating  saw and removed in its entirety.  The tibiotalar joint was then exposed.  Severe arthritic changes were noted with subluxation of the talus posteriorly relative to the tibia.  The remaining articular cartilage and subchondral bone were removed from both sides of the joint.  The dome of the talus was perforated with the drill bit over its entire cortical surface.  The remaining subchondral bone was broken up with a small osteotome.  The wound had been irrigated copiously prior to this with 3 L of normal saline.  Cultures were taken from the medial  malleolus nonunion site and sent to Microbiology for aerobic and anaerobic cultures.  The ankle joint was then reduced.  A large Steinmann pin was placed up through the heel across the subtalar joint and across the tibiotalar joint holding the reduction in appropriate position.  A Biomet lateral ankle fusion plate was then applied to the lateral aspect of the talus and provisionally pinned.  Radiographs confirmed appropriate reduction of the joint and appropriate position of the plate.  The plate was then secured to the dome of the talus using 4 locking and nonlocking screws.  The compression device was then applied to the distal tibia and the plate.  The tibiotalar joint was compressed appropriately.  Locking and nonlocking screws were then used to secure the plate to the tibia.  The compression device was then removed.  AP and lateral radiographs confirmed appropriate position and length of all hardware and appropriate reduction of the tibiotalar joint.  At this point, the medial malleolus was dissected free in subperiosteal fashion.  It was then morselized into bone graft along with the cancellous portion of the fibula using the bone mill.  Bone graft was then packed into the tibiotalar joint medially and laterally.  AP and lateral radiographs confirmed appropriate position of all of the bone graft.  The wounds were irrigated medially and laterally.  Deep subcutaneous tissues were approximated with inverted simple sutures of 2- 0 Vicryl.  The skin incisions were closed with running and horizontal mattress sutures of 3-0 nylon.  Sterile dressings were applied followed by a well-padded short-leg splint.  Tourniquet was released after application of dressings at 2 hours and 5 minutes.  The patient was awakened from anesthesia and transported to the recovery room in stable condition.  FOLLOWUP PLAN:  The patient will be nonweightbearing on the right lower extremity likely for 3 months.   She will be observed overnight for pain control.  She will have physical therapy tomorrow morning and start Lovenox for DVT prophylaxis.  Alfredo MartinezJustin Ollis, PA-C was present and scrubbed for the duration of the case.  His assistance was essential in positioning the patient, prepping and draping, gaining and maintaining exposure, performing the operation, closing and dressing the wounds, and applying the splint.  RADIOGRAPHS:  AP and lateral radiographs of the right ankle were obtained intraoperatively.  These show interval arthrodesis of the ankle joint with appropriate position and length of all hardware and appropriate reduction of the tibiotalar joint.     Toni ArthursJohn Rosaland Shiffman, MD     JH/MEDQ  D:  03/12/2016  T:  03/13/2016  Job:  [id number]

## 2016-03-16 ENCOUNTER — Ambulatory Visit: Payer: BC Managed Care – PPO | Admitting: Adult Health

## 2016-03-17 ENCOUNTER — Encounter (HOSPITAL_COMMUNITY): Payer: Self-pay | Admitting: Orthopedic Surgery

## 2016-03-17 LAB — AEROBIC/ANAEROBIC CULTURE (SURGICAL/DEEP WOUND): CULTURE: NO GROWTH

## 2016-03-17 LAB — AEROBIC/ANAEROBIC CULTURE W GRAM STAIN (SURGICAL/DEEP WOUND)

## 2016-03-18 ENCOUNTER — Ambulatory Visit: Payer: BC Managed Care – PPO | Admitting: Podiatry

## 2016-03-27 ENCOUNTER — Other Ambulatory Visit: Payer: Self-pay | Admitting: Neurology

## 2016-03-27 ENCOUNTER — Ambulatory Visit: Payer: BC Managed Care – PPO

## 2016-03-30 ENCOUNTER — Ambulatory Visit: Payer: BC Managed Care – PPO | Admitting: Adult Health

## 2016-03-31 ENCOUNTER — Other Ambulatory Visit: Payer: Self-pay | Admitting: Neurology

## 2016-04-13 ENCOUNTER — Ambulatory Visit: Payer: BC Managed Care – PPO | Admitting: Adult Health

## 2016-04-22 ENCOUNTER — Encounter (HOSPITAL_BASED_OUTPATIENT_CLINIC_OR_DEPARTMENT_OTHER): Payer: BC Managed Care – PPO | Attending: Surgery

## 2016-04-22 DIAGNOSIS — Y838 Other surgical procedures as the cause of abnormal reaction of the patient, or of later complication, without mention of misadventure at the time of the procedure: Secondary | ICD-10-CM | POA: Insufficient documentation

## 2016-04-22 DIAGNOSIS — Z7982 Long term (current) use of aspirin: Secondary | ICD-10-CM | POA: Insufficient documentation

## 2016-04-22 DIAGNOSIS — I10 Essential (primary) hypertension: Secondary | ICD-10-CM | POA: Insufficient documentation

## 2016-04-22 DIAGNOSIS — I890 Lymphedema, not elsewhere classified: Secondary | ICD-10-CM | POA: Diagnosis not present

## 2016-04-22 DIAGNOSIS — Z6841 Body Mass Index (BMI) 40.0 and over, adult: Secondary | ICD-10-CM | POA: Diagnosis not present

## 2016-04-22 DIAGNOSIS — T8131XA Disruption of external operation (surgical) wound, not elsewhere classified, initial encounter: Secondary | ICD-10-CM | POA: Insufficient documentation

## 2016-04-22 DIAGNOSIS — Z79899 Other long term (current) drug therapy: Secondary | ICD-10-CM | POA: Insufficient documentation

## 2016-04-22 DIAGNOSIS — G4730 Sleep apnea, unspecified: Secondary | ICD-10-CM | POA: Insufficient documentation

## 2016-04-22 DIAGNOSIS — Z7951 Long term (current) use of inhaled steroids: Secondary | ICD-10-CM | POA: Insufficient documentation

## 2016-04-28 ENCOUNTER — Encounter (HOSPITAL_BASED_OUTPATIENT_CLINIC_OR_DEPARTMENT_OTHER): Payer: BC Managed Care – PPO

## 2016-04-29 DIAGNOSIS — T8131XA Disruption of external operation (surgical) wound, not elsewhere classified, initial encounter: Secondary | ICD-10-CM | POA: Diagnosis not present

## 2016-05-06 DIAGNOSIS — T8131XA Disruption of external operation (surgical) wound, not elsewhere classified, initial encounter: Secondary | ICD-10-CM | POA: Diagnosis not present

## 2016-05-07 ENCOUNTER — Other Ambulatory Visit: Payer: Self-pay

## 2016-05-07 ENCOUNTER — Other Ambulatory Visit: Payer: Self-pay | Admitting: Orthopedic Surgery

## 2016-05-08 ENCOUNTER — Encounter (HOSPITAL_COMMUNITY): Payer: Self-pay | Admitting: *Deleted

## 2016-05-08 NOTE — Progress Notes (Signed)
[name] denies chest pain, short of breath with exertion. Patient has not had a syncopal episode since  06/2015.   Shelley Benita GutterRussel reports that she is not currently taking Lasix or Aldactone, "I cant just jump up to go to bathroom."  [name] checks blood pressure and keeps a record for Dr Elissa HeftyHarding's PA.

## 2016-05-11 MED ORDER — CEFAZOLIN SODIUM-DEXTROSE 2-4 GM/100ML-% IV SOLN
2.0000 g | INTRAVENOUS | Status: AC
  Administered 2016-05-12: 2 g via INTRAVENOUS
  Filled 2016-05-11: qty 100

## 2016-05-12 ENCOUNTER — Inpatient Hospital Stay (HOSPITAL_COMMUNITY): Payer: BC Managed Care – PPO | Admitting: Certified Registered Nurse Anesthetist

## 2016-05-12 ENCOUNTER — Encounter (HOSPITAL_COMMUNITY)
Admission: RE | Disposition: A | Discharge status: Home / Self Care | Payer: Self-pay | Source: Ambulatory Visit | Attending: Orthopedic Surgery

## 2016-05-12 ENCOUNTER — Observation Stay (HOSPITAL_COMMUNITY)
Admission: RE | Admit: 2016-05-12 | Discharge: 2016-05-13 | Disposition: A | Discharge status: Home / Self Care | Payer: BC Managed Care – PPO | Source: Ambulatory Visit | Attending: Orthopedic Surgery | Admitting: Orthopedic Surgery

## 2016-05-12 ENCOUNTER — Encounter (HOSPITAL_COMMUNITY): Payer: Self-pay | Admitting: Urology

## 2016-05-12 DIAGNOSIS — T8130XA Disruption of wound, unspecified, initial encounter: Principal | ICD-10-CM | POA: Insufficient documentation

## 2016-05-12 DIAGNOSIS — G4733 Obstructive sleep apnea (adult) (pediatric): Secondary | ICD-10-CM | POA: Insufficient documentation

## 2016-05-12 DIAGNOSIS — Y838 Other surgical procedures as the cause of abnormal reaction of the patient, or of later complication, without mention of misadventure at the time of the procedure: Secondary | ICD-10-CM | POA: Diagnosis not present

## 2016-05-12 DIAGNOSIS — I10 Essential (primary) hypertension: Secondary | ICD-10-CM | POA: Insufficient documentation

## 2016-05-12 DIAGNOSIS — E669 Obesity, unspecified: Secondary | ICD-10-CM | POA: Diagnosis not present

## 2016-05-12 DIAGNOSIS — T8131XD Disruption of external operation (surgical) wound, not elsewhere classified, subsequent encounter: Secondary | ICD-10-CM

## 2016-05-12 DIAGNOSIS — J45909 Unspecified asthma, uncomplicated: Secondary | ICD-10-CM | POA: Insufficient documentation

## 2016-05-12 DIAGNOSIS — M1990 Unspecified osteoarthritis, unspecified site: Secondary | ICD-10-CM | POA: Insufficient documentation

## 2016-05-12 DIAGNOSIS — Z87891 Personal history of nicotine dependence: Secondary | ICD-10-CM | POA: Diagnosis not present

## 2016-05-12 DIAGNOSIS — K219 Gastro-esophageal reflux disease without esophagitis: Secondary | ICD-10-CM | POA: Diagnosis not present

## 2016-05-12 HISTORY — DX: Irritable bowel syndrome, unspecified: K58.9

## 2016-05-12 HISTORY — DX: Dyspnea, unspecified: R06.00

## 2016-05-12 HISTORY — DX: Rosacea, unspecified: L71.9

## 2016-05-12 HISTORY — PX: APPLICATION OF WOUND VAC: SHX5189

## 2016-05-12 HISTORY — PX: INCISION AND DRAINAGE OF WOUND: SHX1803

## 2016-05-12 LAB — CBC
HCT: 38.2 % (ref 36.0–46.0)
Hemoglobin: 12.6 g/dL (ref 12.0–15.0)
MCH: 30.1 pg (ref 26.0–34.0)
MCHC: 33 g/dL (ref 30.0–36.0)
MCV: 91.2 fL (ref 78.0–100.0)
PLATELETS: 192 10*3/uL (ref 150–400)
RBC: 4.19 MIL/uL (ref 3.87–5.11)
RDW: 13.2 % (ref 11.5–15.5)
WBC: 5.7 10*3/uL (ref 4.0–10.5)

## 2016-05-12 LAB — BASIC METABOLIC PANEL
Anion gap: 10 (ref 5–15)
BUN: 11 mg/dL (ref 6–20)
CALCIUM: 9.8 mg/dL (ref 8.9–10.3)
CHLORIDE: 103 mmol/L (ref 101–111)
CO2: 24 mmol/L (ref 22–32)
CREATININE: 0.7 mg/dL (ref 0.44–1.00)
GFR calc non Af Amer: >60 mL/min (ref >60)
GLUCOSE: 90 mg/dL (ref 65–99)
Potassium: 3.9 mmol/L (ref 3.5–5.1)
Sodium: 137 mmol/L (ref 135–145)

## 2016-05-12 SURGERY — IRRIGATION AND DEBRIDEMENT WOUND
Anesthesia: General | Site: Ankle | Laterality: Right

## 2016-05-12 MED ORDER — VITAMIN D 1000 UNITS PO TABS
6000.0000 [IU] | ORAL_TABLET | Freq: Every day | ORAL | Status: DC
  Administered 2016-05-13: 6000 [IU] via ORAL
  Filled 2016-05-12: qty 6

## 2016-05-12 MED ORDER — PRAMIPEXOLE DIHYDROCHLORIDE 0.125 MG PO TABS
0.2500 mg | ORAL_TABLET | ORAL | Status: DC
  Administered 2016-05-12: 0.25 mg via ORAL
  Filled 2016-05-12: qty 2

## 2016-05-12 MED ORDER — POTASSIUM CHLORIDE CRYS ER 10 MEQ PO TBCR
10.0000 meq | EXTENDED_RELEASE_TABLET | Freq: Two times a day (BID) | ORAL | Status: DC
  Administered 2016-05-12 – 2016-05-13 (×2): 10 meq via ORAL
  Filled 2016-05-12 (×2): qty 1

## 2016-05-12 MED ORDER — ACETAMINOPHEN 500 MG PO TABS
500.0000 mg | ORAL_TABLET | Freq: Four times a day (QID) | ORAL | Status: DC | PRN
  Administered 2016-05-12 – 2016-05-13 (×2): 500 mg via ORAL
  Filled 2016-05-12 (×2): qty 1

## 2016-05-12 MED ORDER — ONDANSETRON HCL 4 MG/2ML IJ SOLN
4.0000 mg | Freq: Four times a day (QID) | INTRAMUSCULAR | Status: DC | PRN
  Administered 2016-05-12: 4 mg via INTRAVENOUS
  Filled 2016-05-12: qty 2

## 2016-05-12 MED ORDER — FLUTICASONE PROPIONATE 50 MCG/ACT NA SUSP
2.0000 | Freq: Two times a day (BID) | NASAL | Status: DC
  Administered 2016-05-12 – 2016-05-13 (×2): 2 via NASAL
  Filled 2016-05-12: qty 16

## 2016-05-12 MED ORDER — LACTATED RINGERS IV SOLN
INTRAVENOUS | Status: DC
Start: 2016-05-12 — End: 2016-05-12
  Administered 2016-05-12: 14:00:00 via INTRAVENOUS

## 2016-05-12 MED ORDER — FUROSEMIDE 40 MG PO TABS
40.0000 mg | ORAL_TABLET | Freq: Every day | ORAL | Status: DC
Start: 2016-05-12 — End: 2016-05-13
  Administered 2016-05-12 – 2016-05-13 (×2): 40 mg via ORAL
  Filled 2016-05-12 (×2): qty 1

## 2016-05-12 MED ORDER — AMLODIPINE BESYLATE 10 MG PO TABS
20.0000 mg | ORAL_TABLET | Freq: Every evening | ORAL | Status: DC
  Administered 2016-05-12: 20 mg via ORAL
  Filled 2016-05-12: qty 2

## 2016-05-12 MED ORDER — LIDOCAINE 2% (20 MG/ML) 5 ML SYRINGE
INTRAMUSCULAR | Status: DC | PRN
  Administered 2016-05-12: 60 mg via INTRAVENOUS

## 2016-05-12 MED ORDER — PRAMIPEXOLE DIHYDROCHLORIDE 0.125 MG PO TABS
0.7500 mg | ORAL_TABLET | Freq: Every day | ORAL | Status: DC
  Administered 2016-05-12: 0.75 mg via ORAL
  Filled 2016-05-12: qty 6

## 2016-05-12 MED ORDER — MORPHINE SULFATE (PF) 2 MG/ML IV SOLN: 2.0000 mg | INTRAVENOUS | Status: DC | PRN

## 2016-05-12 MED ORDER — MIDAZOLAM HCL 2 MG/2ML IJ SOLN
INTRAMUSCULAR | Status: AC
  Filled 2016-05-12: qty 2

## 2016-05-12 MED ORDER — MIDAZOLAM HCL 5 MG/5ML IJ SOLN
INTRAMUSCULAR | Status: DC | PRN
  Administered 2016-05-12: 1 mg via INTRAVENOUS

## 2016-05-12 MED ORDER — SPIRONOLACTONE 25 MG PO TABS
12.5000 mg | ORAL_TABLET | Freq: Every day | ORAL | Status: DC
  Administered 2016-05-12 – 2016-05-13 (×2): 12.5 mg via ORAL
  Filled 2016-05-12 (×2): qty 1

## 2016-05-12 MED ORDER — IRBESARTAN 150 MG PO TABS
75.0000 mg | ORAL_TABLET | Freq: Every day | ORAL | Status: DC
  Administered 2016-05-12 – 2016-05-13 (×2): 75 mg via ORAL
  Filled 2016-05-12 (×2): qty 1

## 2016-05-12 MED ORDER — CALCIUM CARB-CHOLECALCIFEROL 600-800 MG-UNIT PO TABS: ORAL_TABLET | Freq: Two times a day (BID) | ORAL | Status: DC

## 2016-05-12 MED ORDER — HYDROMORPHONE HCL 1 MG/ML IJ SOLN
0.2500 mg | INTRAMUSCULAR | Status: DC | PRN
  Administered 2016-05-12 (×2): 0.5 mg via INTRAVENOUS

## 2016-05-12 MED ORDER — LACTATED RINGERS IV SOLN: INTRAVENOUS | Status: DC

## 2016-05-12 MED ORDER — SODIUM CHLORIDE 0.9 % IV SOLN: INTRAVENOUS | Status: DC

## 2016-05-12 MED ORDER — PHENOBARBITAL 32.4 MG PO TABS
129.6000 mg | ORAL_TABLET | Freq: Every day | ORAL | Status: DC
  Administered 2016-05-12: 129.6 mg via ORAL
  Filled 2016-05-12: qty 4

## 2016-05-12 MED ORDER — MONTELUKAST SODIUM 10 MG PO TABS
10.0000 mg | ORAL_TABLET | Freq: Every day | ORAL | Status: DC
  Administered 2016-05-12: 10 mg via ORAL
  Filled 2016-05-12: qty 1

## 2016-05-12 MED ORDER — FERROUS SULFATE 325 (65 FE) MG PO TABS
325.0000 mg | ORAL_TABLET | Freq: Two times a day (BID) | ORAL | Status: DC
  Administered 2016-05-12 – 2016-05-13 (×2): 325 mg via ORAL
  Filled 2016-05-12 (×2): qty 1

## 2016-05-12 MED ORDER — ROSUVASTATIN CALCIUM 10 MG PO TABS
20.0000 mg | ORAL_TABLET | Freq: Every evening | ORAL | Status: DC
  Administered 2016-05-12: 20 mg via ORAL
  Filled 2016-05-12: qty 2

## 2016-05-12 MED ORDER — FENTANYL CITRATE (PF) 100 MCG/2ML IJ SOLN
INTRAMUSCULAR | Status: DC | PRN
  Administered 2016-05-12 (×2): 25 ug via INTRAVENOUS

## 2016-05-12 MED ORDER — ALBUTEROL SULFATE (2.5 MG/3ML) 0.083% IN NEBU: 2.5000 mg | INHALATION_SOLUTION | RESPIRATORY_TRACT | Status: DC | PRN

## 2016-05-12 MED ORDER — PANTOPRAZOLE SODIUM 40 MG PO TBEC
40.0000 mg | DELAYED_RELEASE_TABLET | Freq: Every day | ORAL | Status: DC
  Administered 2016-05-12 – 2016-05-13 (×2): 40 mg via ORAL
  Filled 2016-05-12 (×2): qty 1

## 2016-05-12 MED ORDER — MEPERIDINE HCL 25 MG/ML IJ SOLN: 6.2500 mg | INTRAMUSCULAR | Status: DC | PRN

## 2016-05-12 MED ORDER — PRAMIPEXOLE DIHYDROCHLORIDE ER 0.75 MG PO TB24
1.0000 | ORAL_TABLET | Freq: Every day | ORAL | Status: DC
Start: 2016-05-12 — End: 2016-05-12

## 2016-05-12 MED ORDER — LORATADINE 10 MG PO TABS
10.0000 mg | ORAL_TABLET | Freq: Every day | ORAL | Status: DC
  Administered 2016-05-13: 10 mg via ORAL
  Filled 2016-05-12: qty 1

## 2016-05-12 MED ORDER — PHENYLEPHRINE 40 MCG/ML (10ML) SYRINGE FOR IV PUSH (FOR BLOOD PRESSURE SUPPORT)
PREFILLED_SYRINGE | INTRAVENOUS | Status: DC | PRN
  Administered 2016-05-12: 40 ug via INTRAVENOUS
  Administered 2016-05-12: 80 ug via INTRAVENOUS

## 2016-05-12 MED ORDER — CHLORHEXIDINE GLUCONATE 4 % EX LIQD: 60.0000 mL | Freq: Once | CUTANEOUS | Status: DC

## 2016-05-12 MED ORDER — TRAMADOL HCL 50 MG PO TABS
50.0000 mg | ORAL_TABLET | Freq: Four times a day (QID) | ORAL | Status: DC | PRN
  Administered 2016-05-12 – 2016-05-13 (×3): 50 mg via ORAL
  Filled 2016-05-12 (×3): qty 1

## 2016-05-12 MED ORDER — BACITRACIN ZINC 500 UNIT/GM EX OINT
TOPICAL_OINTMENT | CUTANEOUS | Status: AC
  Filled 2016-05-12: qty 28.35

## 2016-05-12 MED ORDER — ALBUTEROL SULFATE HFA 108 (90 BASE) MCG/ACT IN AERS: 2.0000 | INHALATION_SPRAY | RESPIRATORY_TRACT | Status: DC | PRN

## 2016-05-12 MED ORDER — GABAPENTIN ENACARBIL ER 600 MG PO TBCR: 900.0000 mg | EXTENDED_RELEASE_TABLET | Freq: Two times a day (BID) | ORAL | Status: DC

## 2016-05-12 MED ORDER — HYDROMORPHONE HCL 1 MG/ML IJ SOLN
INTRAMUSCULAR | Status: AC
  Filled 2016-05-12: qty 0.5

## 2016-05-12 MED ORDER — CALCIUM CARBONATE-VITAMIN D 500-200 MG-UNIT PO TABS
1.0000 | ORAL_TABLET | Freq: Two times a day (BID) | ORAL | Status: DC
  Administered 2016-05-12 – 2016-05-13 (×2): 1 via ORAL
  Filled 2016-05-12 (×2): qty 1

## 2016-05-12 MED ORDER — PROPOFOL 10 MG/ML IV BOLUS
INTRAVENOUS | Status: DC | PRN
  Administered 2016-05-12: 20 mg via INTRAVENOUS
  Administered 2016-05-12: 180 mg via INTRAVENOUS

## 2016-05-12 MED ORDER — LEVOCETIRIZINE DIHYDROCHLORIDE 5 MG PO TABS: 5.0000 mg | ORAL_TABLET | Freq: Every evening | ORAL | Status: DC

## 2016-05-12 MED ORDER — FENTANYL CITRATE (PF) 100 MCG/2ML IJ SOLN
INTRAMUSCULAR | Status: AC
  Filled 2016-05-12: qty 2

## 2016-05-12 MED ORDER — PROPOFOL 10 MG/ML IV BOLUS
INTRAVENOUS | Status: AC
  Filled 2016-05-12: qty 20

## 2016-05-12 MED ORDER — ASPIRIN EC 81 MG PO TBEC
81.0000 mg | DELAYED_RELEASE_TABLET | Freq: Two times a day (BID) | ORAL | Status: DC
  Administered 2016-05-12 – 2016-05-13 (×2): 81 mg via ORAL
  Filled 2016-05-12 (×2): qty 1

## 2016-05-12 MED ORDER — ONDANSETRON HCL 4 MG/2ML IJ SOLN
INTRAMUSCULAR | Status: DC | PRN
  Administered 2016-05-12: 4 mg via INTRAVENOUS

## 2016-05-12 MED ORDER — DOXYCYCLINE 40 MG PO CPDR
40.0000 mg | DELAYED_RELEASE_CAPSULE | ORAL | Status: DC
Start: 2016-05-13 — End: 2016-05-12

## 2016-05-12 MED ORDER — ONDANSETRON HCL 4 MG PO TABS: 4.0000 mg | ORAL_TABLET | Freq: Four times a day (QID) | ORAL | Status: DC | PRN

## 2016-05-12 MED ORDER — PHENYLEPHRINE HCL 10 MG/ML IJ SOLN
INTRAVENOUS | Status: DC | PRN
  Administered 2016-05-12: 15 ug/min via INTRAVENOUS

## 2016-05-12 MED ORDER — DOXYCYCLINE CALCIUM 50 MG/5ML PO SYRP
40.0000 mg | ORAL_SOLUTION | Freq: Every day | ORAL | Status: DC
  Administered 2016-05-13: 40 mg via ORAL
  Filled 2016-05-12: qty 4

## 2016-05-12 MED ORDER — GABAPENTIN 300 MG PO CAPS
900.0000 mg | ORAL_CAPSULE | Freq: Two times a day (BID) | ORAL | Status: DC
  Administered 2016-05-12 – 2016-05-13 (×2): 900 mg via ORAL
  Filled 2016-05-12 (×2): qty 3

## 2016-05-12 MED ORDER — SODIUM CHLORIDE 0.9 % IR SOLN
Status: DC | PRN
  Administered 2016-05-12: 3000 mL

## 2016-05-12 MED ORDER — BIOTIN 5000 MCG PO CAPS: 5000.0000 ug | ORAL_CAPSULE | Freq: Two times a day (BID) | ORAL | Status: DC

## 2016-05-12 SURGICAL SUPPLY — 57 items
BANDAGE ACE 4X5 VEL STRL LF (GAUZE/BANDAGES/DRESSINGS) ×3 IMPLANT
BANDAGE ESMARK 6X9 LF (GAUZE/BANDAGES/DRESSINGS) ×1 IMPLANT
BLADE SURG 10 STRL SS (BLADE) ×3 IMPLANT
BNDG CMPR 9X6 STRL LF SNTH (GAUZE/BANDAGES/DRESSINGS) ×1
BNDG COHESIVE 4X5 TAN STRL (GAUZE/BANDAGES/DRESSINGS) ×1 IMPLANT
BNDG COHESIVE 6X5 TAN STRL LF (GAUZE/BANDAGES/DRESSINGS) ×1 IMPLANT
BNDG CONFORM 3 STRL LF (GAUZE/BANDAGES/DRESSINGS) ×3 IMPLANT
BNDG ESMARK 6X9 LF (GAUZE/BANDAGES/DRESSINGS) ×3
CANISTER SUCT 3000ML PPV (MISCELLANEOUS) ×3 IMPLANT
CHLORAPREP W/TINT 26ML (MISCELLANEOUS) ×1 IMPLANT
CONT SPECI 4OZ STER CLIK (MISCELLANEOUS) ×2 IMPLANT
COVER SURGICAL LIGHT HANDLE (MISCELLANEOUS) ×3 IMPLANT
CUFF TOURNIQUET SINGLE 34IN LL (TOURNIQUET CUFF) ×1 IMPLANT
CUFF TOURNIQUET SINGLE 44IN (TOURNIQUET CUFF) IMPLANT
DRAIN CHANNEL 10M FLAT 3/4 FLT (DRAIN) IMPLANT
DRAIN PENROSE 1/2X12 LTX STRL (WOUND CARE) IMPLANT
DRAPE U-SHAPE 47X51 STRL (DRAPES) ×3 IMPLANT
DRSG MEPITEL 4X7.2 (GAUZE/BANDAGES/DRESSINGS) ×3 IMPLANT
DRSG PAD ABDOMINAL 8X10 ST (GAUZE/BANDAGES/DRESSINGS) IMPLANT
DRSG VAC ATS SM SENSATRAC (GAUZE/BANDAGES/DRESSINGS) ×2 IMPLANT
ELECT REM PT RETURN 9FT ADLT (ELECTROSURGICAL) ×3
ELECTRODE REM PT RTRN 9FT ADLT (ELECTROSURGICAL) ×1 IMPLANT
EVACUATOR SILICONE 100CC (DRAIN) IMPLANT
GAUZE SPONGE 4X4 12PLY STRL (GAUZE/BANDAGES/DRESSINGS) IMPLANT
GLOVE BIO SURGEON STRL SZ8 (GLOVE) ×3 IMPLANT
GLOVE BIOGEL PI IND STRL 8 (GLOVE) ×2 IMPLANT
GLOVE BIOGEL PI INDICATOR 8 (GLOVE) ×4
GLOVE ECLIPSE 7.5 STRL STRAW (GLOVE) ×3 IMPLANT
GOWN STRL REUS W/ TWL XL LVL3 (GOWN DISPOSABLE) ×2 IMPLANT
GOWN STRL REUS W/TWL XL LVL3 (GOWN DISPOSABLE) ×6
KIT BASIN OR (CUSTOM PROCEDURE TRAY) ×3 IMPLANT
KIT ROOM TURNOVER OR (KITS) ×3 IMPLANT
NS IRRIG 1000ML POUR BTL (IV SOLUTION) ×3 IMPLANT
PACK ORTHO EXTREMITY (CUSTOM PROCEDURE TRAY) ×3 IMPLANT
PAD ARMBOARD 7.5X6 YLW CONV (MISCELLANEOUS) ×6 IMPLANT
PAD CAST 4YDX4 CTTN HI CHSV (CAST SUPPLIES) IMPLANT
PAD NEG PRESSURE SENSATRAC (MISCELLANEOUS) ×2 IMPLANT
PADDING CAST COTTON 4X4 STRL (CAST SUPPLIES) ×3
SET CYSTO W/LG BORE CLAMP LF (SET/KITS/TRAYS/PACK) ×3 IMPLANT
SOAP 2 % CHG 4 OZ (WOUND CARE) ×3 IMPLANT
SPONGE LAP 4X18 X RAY DECT (DISPOSABLE) ×3 IMPLANT
STAPLER VISISTAT 35W (STAPLE) IMPLANT
SUCTION FRAZIER HANDLE 10FR (MISCELLANEOUS) ×2
SUCTION TUBE FRAZIER 10FR DISP (MISCELLANEOUS) ×1 IMPLANT
SUT ETHILON 2 0 FS 18 (SUTURE) IMPLANT
SUT PROLENE 3 0 PS 2 (SUTURE) IMPLANT
SUT VIC AB 2-0 CT1 27 (SUTURE)
SUT VIC AB 2-0 CT1 TAPERPNT 27 (SUTURE) IMPLANT
SUT VIC AB 3-0 PS2 18 (SUTURE)
SUT VIC AB 3-0 PS2 18XBRD (SUTURE) IMPLANT
TOWEL OR 17X24 6PK STRL BLUE (TOWEL DISPOSABLE) ×3 IMPLANT
TOWEL OR 17X26 10 PK STRL BLUE (TOWEL DISPOSABLE) ×3 IMPLANT
TUBE CONNECTING 12'X1/4 (SUCTIONS) ×1
TUBE CONNECTING 12X1/4 (SUCTIONS) ×2 IMPLANT
TUBING CYSTO DISP (UROLOGICAL SUPPLIES) ×3 IMPLANT
WATER STERILE IRR 1000ML POUR (IV SOLUTION) ×3 IMPLANT
YANKAUER SUCT BULB TIP NO VENT (SUCTIONS) ×3 IMPLANT

## 2016-05-12 NOTE — Brief Op Note (Signed)
05/12/2016  3:05 PM  PATIENT:  [name]  66 y.o. female  PRE-OPERATIVE DIAGNOSIS:  Right ankle wound dehiscence   POST-OPERATIVE DIAGNOSIS:  right ankle wound dehiscence  Procedure(s): 1.  Irrigation and excisional debridement of right ankle wound including skin, subcut tissue and muscle 2.  Application of wound VAC to right ankle wound 40 mm x 26 mm x 12 mm  SURGEON:  Toni ArthursJohn Gagandeep Kossman, MD  ASSISTANT: n/a  ANESTHESIA:   General  EBL:  minimal   TOURNIQUET:  n/a  COMPLICATIONS:  None apparent  DISPOSITION:  Extubated, awake and stable to recovery.  DICTATION ID:  [id number]

## 2016-05-12 NOTE — OR Nursing (Signed)
Late entry for implant documentation. 
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
## 2016-05-12 NOTE — Transfer of Care (Signed)
Immediate Anesthesia Transfer of Care Note  Patient: [name]  Procedure(s) Performed: Procedure(s): IRRIGATION AND DEBRIDEMENT right ankle wound; application of wound vac (Right) APPLICATION OF WOUND VAC (Right)  Patient Location: PACU  Anesthesia Type:General  Level of Consciousness: awake, alert  and oriented  Airway & Oxygen Therapy: Patient Spontanous Breathing  Post-op Assessment: Report given to RN and Post -op Vital signs reviewed and stable  Post vital signs: Reviewed and stable  Last Vitals:  Vitals:   05/12/16 1138 05/12/16 1139  BP:  (!) 170/67  Pulse: 71   Resp: 18   Temp: 36.7 C     Last Pain:  Vitals:   05/12/16 1214  TempSrc:   PainSc: 3          Complications: No apparent anesthesia complications

## 2016-05-12 NOTE — Anesthesia Procedure Notes (Signed)
Procedure Name: LMA Insertion Date/Time: 05/12/2016 2:42 PM Performed by: Daiva EvesAVENEL, Graesyn Schreifels W Pre-anesthesia Checklist: Patient identified, Emergency Drugs available, Suction available, Patient being monitored and Timeout performed Patient Re-evaluated:Patient Re-evaluated prior to inductionOxygen Delivery Method: Circle system utilized Preoxygenation: Pre-oxygenation with 100% oxygen Intubation Type: IV induction Ventilation: Mask ventilation with difficulty LMA: LMA inserted LMA Size: 4.0 Tube type: Oral Placement Confirmation: positive ETCO2,  CO2 detector and breath sounds checked- equal and bilateral Tube secured with: Tape Dental Injury: Teeth and Oropharynx as per pre-operative assessment

## 2016-05-12 NOTE — Anesthesia Preprocedure Evaluation (Signed)
Anesthesia Evaluation  Patient identified by MRN, date of birth, ID band Patient awake    Reviewed: Allergy & Precautions, NPO status , Patient's Chart, lab work & pertinent test results  Airway Mallampati: III  TM Distance: >3 FB Neck ROM: Full    Dental  (+) Teeth Intact, Dental Advisory Given   Pulmonary asthma , sleep apnea , former smoker,    breath sounds clear to auscultation       Cardiovascular hypertension, Pt. on medications + Peripheral Vascular Disease   Rhythm:Regular Rate:Normal     Neuro/Psych  Headaches, Seizures -,   Neuromuscular disease negative psych ROS   GI/Hepatic Neg liver ROS, GERD  Medicated,  Endo/Other  negative endocrine ROS  Renal/GU negative Renal ROS  negative genitourinary   Musculoskeletal  (+) Arthritis ,   Abdominal   Peds negative pediatric ROS (+)  Hematology negative hematology ROS (+)   Anesthesia Other Findings   Reproductive/Obstetrics negative OB ROS                             Anesthesia Physical Anesthesia Plan  ASA: III  Anesthesia Plan: General   Post-op Pain Management:    Induction: Intravenous  Airway Management Planned: LMA  Additional Equipment:   Intra-op Plan:   Post-operative Plan: Extubation in OR  Informed Consent: I have reviewed the patients History and Physical, chart, labs and discussed the procedure including the risks, benefits and alternatives for the proposed anesthesia with the patient or authorized representative who has indicated his/her understanding and acceptance.   Dental advisory given  Plan Discussed with: CRNA  Anesthesia Plan Comments:         Anesthesia Quick Evaluation

## 2016-05-12 NOTE — Discharge Instructions (Signed)
Shelley ArthursJohn Tytan Sandate, MD [facility]  Please read the following information regarding your care after surgery.  Medications  You only need a prescription for the narcotic pain medicine (ex. oxycodone, Percocet, Norco).  All of the other medicines listed below are available over the counter. X acetominophen (Tylenol) 650 mg every 4-6 hours as you need for minor pain X tramadol as prescribed for moderate to severe pain  X To help prevent blood clots, take a baby aspirin (81 mg) twice a day for two weeks after surgery.  You should also get up every hour while you are awake to move around.    Weight Bearing X Bear weight when you are able on your operated leg or foot in the CAM boot. ? Bear weight only on the heel of your operated foot in the post-op shoe. ? Do not bear any weight on the operated leg or foot.  Cast / Splint / Dressing X wound VAC changes by home health or the wound center. ? Remove your dressing 3 days after surgery and cover the incisions with dry dressings.    After your dressing, cast or splint is removed; you may shower, but do not soak or scrub the wound.  Allow the water to run over it, and then gently pat it dry.  Swelling It is normal for you to have swelling where you had surgery.  To reduce swelling and pain, keep your toes above your nose for at least 3 days after surgery.  It may be necessary to keep your foot or leg elevated for several weeks.  If it hurts, it should be elevated.  Follow Up Call my office at [phone] when you are discharged from the hospital or surgery center to schedule an appointment to be seen two weeks after surgery.  Call my office at [phone] if you develop a fever >101.5 F, nausea, vomiting, bleeding from the surgical site or severe pain.

## 2016-05-13 DIAGNOSIS — T8130XA Disruption of wound, unspecified, initial encounter: Secondary | ICD-10-CM | POA: Diagnosis not present

## 2016-05-13 MED ORDER — TRAMADOL HCL 50 MG PO TABS: 50.0000 mg | ORAL_TABLET | Freq: Four times a day (QID) | ORAL | 0 refills | Status: DC | PRN

## 2016-05-13 NOTE — Progress Notes (Signed)
Reviewed discharge instructions/medications with patient and patient's husband.  Hooked her up to her [employer] wound vac.  Case Manager set up for home health to come out to patient's home for wound vac care.  Patient is ready for discharge.

## 2016-05-13 NOTE — Evaluation (Signed)
Physical Therapy Evaluation Patient Details Name: [name] MRN: [id number] DOB: [birth year] Today's Date: 05/13/2016   History of Present Illness  Pt is a 66 y.o. female admitted with rt ankle wound dehiscence after ankle arthrodesis. Pt now s/p I&D and application of wound VAC. PMH: syncope, seizures, restless leg syndrome, Lt knee pain, ORIF rt ankle.   Clinical Impression  Pt reports that PTA she was not ambulating but able to perform stand pivot transfers with her rw from recliner<>BSC. Following initial PT session, the pt states that she is near her prior level of function. Her weight bearing has been progressed to 75 pounds and the pt states that she is interested in beginning some HHPT to work on ambulation and improved mobility and strength. PT will continue to follow to progress mobility and safety in anticipation of D/C to home.     Follow Up Recommendations Home health PT;Supervision for mobility/OOB    Equipment Recommendations  None recommended by PT    Recommendations for Other Services       Precautions / Restrictions Precautions Precautions: Fall Required Braces or Orthoses: Other Brace/Splint Other Brace/Splint: CAM boot Restrictions Weight Bearing Restrictions: Yes RLE Weight Bearing: Partial weight bearing RLE Partial Weight Bearing Percentage or Pounds: 75 pounds in CAM boot      Mobility  Bed Mobility Overal bed mobility: Needs Assistance Bed Mobility: Supine to Sit     Supine to sit: Min assist     General bed mobility comments: supine to sitting with HOB elevated approx. 20 degrees  Transfers Overall transfer level: Needs assistance Equipment used: Rolling walker (2 wheeled) Transfers: Sit to/from [employer] Sit to Stand: Min guard Stand pivot transfers: Min guard       General transfer comment: bed>BSC>chair, reminder cues for 75 lb weightbearing.   Ambulation/Gait                Stairs             Wheelchair Mobility    Modified Rankin (Stroke Patients Only)       Balance Overall balance assessment: Needs assistance Sitting-balance support: No upper extremity supported Sitting balance-Leahy Scale: Good     Standing balance support: Bilateral upper extremity supported Standing balance-Leahy Scale: Poor Standing balance comment: using rw                             Pertinent Vitals/Pain Pain Assessment: 0-10 Pain Score: 2  Pain Location: head Pain Descriptors / Indicators: Aching Pain Intervention(s): Limited activity within patient's tolerance;Monitored during session    Home Living Family/patient expects to be discharged to:: Private residence Living Arrangements: Spouse/significant other Available Help at Discharge: Family;Available PRN/intermittently Type of Home: House Home Access: Ramped entrance     Home Layout: Two level;Able to live on main level with bedroom/bathroom Home Equipment: Dan Humphreys - 2 wheels;Bedside commode;Wheelchair - manual Additional Comments: Pt has been sleeping in recliner, spouse works during the day. Pt has been able to do her own transfers to [facility] as needed.     Prior Function Level of Independence: Independent with assistive device(s);Needs assistance         Comments: Pt reports that she has not been ambulating, using rw for transfers. At most she has been taking a couple steps.      Hand Dominance        Extremity/Trunk Assessment   Upper Extremity Assessment Upper Extremity Assessment: Generalized weakness  Lower Extremity Assessment Lower Extremity Assessment: Generalized weakness       Communication   Communication: No difficulties  Cognition Arousal/Alertness: Awake/alert Behavior During Therapy: WFL for tasks assessed/performed Overall Cognitive Status: Within Functional Limits for tasks assessed                      General Comments      Exercises     Assessment/Plan    PT  Assessment Patient needs continued PT services  PT Problem List Decreased strength;Decreased range of motion;Decreased activity tolerance;Decreased balance;Decreased mobility          PT Treatment Interventions DME instruction;Gait training;Functional mobility training;Therapeutic activities;Therapeutic exercise;Patient/family education    PT Goals (Current goals can be found in the Care Plan section)  Acute Rehab PT Goals Patient Stated Goal: get back home, eventually walk PT Goal Formulation: With patient Time For Goal Achievement: 05/27/16 Potential to Achieve Goals: Good    Frequency Min 3X/week   Barriers to discharge        Co-evaluation               End of Session Equipment Utilized During Treatment: Gait belt (CAM boot) Activity Tolerance: Patient tolerated treatment well Patient left: in chair;with call bell/phone within reach Nurse Communication: Mobility status;Weight bearing status    Functional Assessment Tool Used: clinical judgment Functional Limitation: Mobility: Walking and moving around Mobility: Walking and Moving Around Current Status [phone](G8978): At least 40 percent but less than 60 percent impaired, limited or restricted Mobility: Walking and Moving Around Goal Status [phone](G8979): At least 20 percent but less than 40 percent impaired, limited or restricted    Time: 1005-1039 PT Time Calculation (min) (ACUTE ONLY): 34 min   Charges:   PT Evaluation $PT Eval Moderate Complexity: 1 Procedure PT Treatments $Therapeutic Activity: 8-22 mins   PT G Codes:   PT G-Codes **NOT FOR INPATIENT CLASS** Functional Assessment Tool Used: clinical judgment Functional Limitation: Mobility: Walking and moving around Mobility: Walking and Moving Around Current Status ([id number](G8978): At least 40 percent but less than 60 percent impaired, limited or restricted Mobility: Walking and Moving Around Goal Status [phone](G8979): At least 20 percent but less than 40 percent impaired,  limited or restricted    Christiane HaBenjamin J. Analeya Luallen, PT, CSCS Pager [phone] Office [phone]  05/13/2016, 10:51 AM

## 2016-05-13 NOTE — Op Note (Signed)
NAME:  [name], [name]                     ACCOUNT NO.:  MEDICAL RECORD NO.:  [id number]  LOCATION:                                 FACILITY:  PHYSICIAN:  Toni ArthursJohn Brytnee Bechler, MD        DATE OF BIRTH:  07-02-1950  DATE OF PROCEDURE:  05/12/2016 DATE OF DISCHARGE:                              OPERATIVE REPORT   PREOPERATIVE DIAGNOSIS:  Right ankle lateral wound dehiscence.  POSTOPERATIVE DIAGNOSIS:  Right ankle lateral wound dehiscence.  PROCEDURES: 1. Irrigation and excisional debridement of right ankle wound     including skin, subcutaneous tissue and muscle. 2. Application of wound VAC to right ankle wound measuring 40 mm x 26     mm x 12 mm.  SURGEON:  Toni ArthursJohn Merleen Picazo, MD  ANESTHESIA:  General.  ESTIMATED BLOOD LOSS:  Minimal.  COMPLICATIONS:  None apparent.  DISPOSITION:  Extubated, awake, and stable to recovery.  INDICATION FOR PROCEDURES:  The patient is a 66 year old woman, who has a history of right ankle fracture complicated by delayed wound healing. She has gone on to ankle fusion 2 months ago.  The proximal end of the wound opened up in the postoperative period.  She has a chronic wound that has not healed despite extensive treatment at the Wound Center by Dr. Meyer RusselBritto.  She presents now for operative treatment of this wound. She understands the risks and benefits of the alternative treatment options and elects surgical treatment.  She specifically understands risks of bleeding, infection, nerve damage, blood clots, need for additional surgery, continued pain, nonunion, amputation and death.  PROCEDURE IN DETAIL:  After preoperative consent was obtained and the correct operative site was identified, the patient was brought to the operating room and placed supine on the operating table.  General anesthesia was induced.  Preoperative antibiotics were administered. Surgical time-out was taken.  The right lower extremity was prepped and draped in standard sterile fashion.  The  lateral wound was identified. Excisional debridement was then performed with a scalpel and curette. Circumferential debridement of the wound was carried out from the level of the skin down through the subcutaneous tissue to the level of muscle. There was no evident bone or metal at the base of the wound.  The wound measured 40 mm in length by 26 mm in depth and 12 mm in width.  Three liters of normal saline were used to irrigate the wound copiously.  A small wound VAC sponge was cut to fit the wound.  It was inserted to the full depth of the wound and covered with an occlusive dressing. Negative pressure was applied at 125 mmHg and appropriate seal was achieved.  Sterile dressings were applied followed by an Ace wrap.  The patient was awakened from anesthesia and transported to the recovery room in stable condition.  Deep tissue obtained from the wound was sent as a specimen to Microbiology for aerobic and anaerobic cultures. Specimen was obtained prior to administering IV antibiotics.  FOLLOWUP PLAN:  The patient will be partial weightbearing on the right lower extremity.  We will admit her for observation and start physical therapy tomorrow.  We will hold  DVT prophylaxis due to the presence of the wound VAC.     [name], MD     JH/MEDQ  D:  05/12/2016  T:  05/12/2016  Job:  [id number]  cc:   Evlyn Kanner, M.D.

## 2016-05-13 NOTE — Care Management Note (Signed)
Case Management Note  Patient Details  Name: Denver FasterDale H Lavoy MRN: [id number] Date of Birth: 1950/12/16  Subjective/Objective:    66 yr old female admitted with right ankle wound dehiscence after ankle arthrodesis, s/p I & D with application of wound VAC.                  Action/Plan: Case manager spoke with patient concerning discharge needs. Choice was offered for Home Health agency. Referral was called to Janeice RobinsonKaren Nusbaumm, [facility]. Case manager had spoken with Dr. Victorino DikeHewitt on 05/12/16 and sent order for wound VAC to rickie Toye with [employer]. Vac has been delivered to patient's room this morning. Patient will discharge with family support.     Expected Discharge Date:    05/13/16              Expected Discharge Plan:  Home w Home Health Services  In-House Referral:  NA  Discharge planning Services  CM Consult  Post Acute Care Choice:  Durable Medical Equipment, Home Health Choice offered to:  Patient  DME Arranged:  Vac DME Agency:  [employer]  HH Arranged:  RN, PT HH Agency:  [facility]  Status of Service:  Completed, signed off  If discussed at Long Length of Stay Meetings, dates discussed:    Additional Comments:  Durenda GuthrieBrady, Sami Froh Naomi, RN 05/13/2016, 12:21 PM

## 2016-05-13 NOTE — Anesthesia Postprocedure Evaluation (Signed)
Anesthesia Post Note  Patient: Tiannah H Groseclose  PrDenver Fasterocedure(s) Performed: Procedure(s) (LRB): IRRIGATION AND DEBRIDEMENT right ankle wound; application of wound vac (Right) APPLICATION OF WOUND VAC (Right)  Patient location during evaluation: PACU Anesthesia Type: General Level of consciousness: awake and alert Pain management: pain level controlled Vital Signs Assessment: post-procedure vital signs reviewed and stable Respiratory status: spontaneous breathing, nonlabored ventilation, respiratory function stable and patient connected to nasal cannula oxygen Cardiovascular status: blood pressure returned to baseline and stable Postop Assessment: no signs of nausea or vomiting Anesthetic complications: no       Last Vitals:  Vitals:   05/13/16 0020 05/13/16 0608  BP: (!) 114/55 120/68  Pulse: 72 63  Resp: 16 16  Temp: 36.1 C 36.1 C    Last Pain:  Vitals:   05/13/16 [id number]  TempSrc: Oral  PainSc:                  Shelton SilvasKevin D Jenesis Suchy

## 2016-05-13 NOTE — Discharge Summary (Signed)
Physician Discharge Summary  Patient ID: [name] MRN: [id number] DOB/AGE: 1950/05/22 66 y.o.  Admit date: 05/12/2016 Discharge date: 05/13/2016  Admission Diagnoses:  Htn, obesity, allergies, right ankle wound dehiscence after ankle arthrodesis  Discharge Diagnoses:  Active Problems:   Wound dehiscence, surgical, subsequent encounter as above. S/p I and D and application of wound VAC  Discharged Condition: stable  Hospital Course: the patient was admitted on 1/2 and taken to the OR for I and D and application of a wound VAc.  She tolerated the procedure well and denies any pain this morning.  She is tolerating a regular diet.  She is ready for discharge after PT today.  She will have wound VAC changes 3x / wk by [facility] RN.  F/u with Dr. Meyer Russel and myself in the next few weeks.  Consults: PT  Significant Diagnostic Studies: none  Treatments: surgery: as above  Discharge Exam: Blood pressure 120/68, pulse 63, temperature 97 F (36.1 C), temperature source Oral, resp. rate 16, height 5\' 2"  (1.575 m), weight 129.3 kg (285 lb), SpO2 95 %. wn wd woman in nad.  WV in place.  scant SS drainage in canister.  NVI at R foot.  Disposition: 06-Home-Health Care Svc  Discharge Instructions    Call MD / Call 911    Complete by:  As directed    If you experience chest pain or shortness of breath, CALL 911 and be transported to the hospital emergency room.  If you develope a fever above 101 F, pus (white drainage) or increased drainage or redness at the wound, or calf pain, call your surgeon's office.   Constipation Prevention    Complete by:  As directed    Drink plenty of fluids.  Prune juice may be helpful.  You may use a stool softener, such as Colace (over the counter) 100 mg twice a day.  Use MiraLax (over the counter) for constipation as needed.   Diet - low sodium heart healthy    Complete by:  As directed    Increase activity slowly as tolerated    Complete by:  As directed    Partial weight bearing    Complete by:  As directed    75#.   Laterality:  right   Extremity:  Lower     Allergies as of 05/13/2016      Reactions   Compazine Shortness Of Breath   Prochlorperazine Maleate Shortness Of Breath   Topamax [topiramate] Hives, Rash   Codeine Sulfate Nausea Only   Seasonal Ic [cholestatin]    Adhesive [tape] Rash   Iodinated Diagnostic Agents Rash   Uncoded Allergy. Allergen: contrast dyes      Medication List    STOP taking these medications   meloxicam 15 MG tablet Commonly known as:  MOBIC   nabumetone 750 MG tablet Commonly known as:  RELAFEN     TAKE these medications   acetaminophen 500 MG tablet Commonly known as:  TYLENOL Take 1,500 mg by mouth 4 (four) times daily as needed for moderate pain or headache.   albuterol 108 (90 Base) MCG/ACT inhaler Commonly known as:  PROVENTIL HFA;VENTOLIN HFA Inhale 2 puffs into the lungs every 4 (four) hours as needed for wheezing.   amLODipine 10 MG tablet Commonly known as:  NORVASC Take 20 mg by mouth every evening.   aspirin EC 81 MG tablet Take 1 tablet (81 mg total) by mouth 2 (two) times daily.   Biotin 5000 MCG Caps Take 5,000 mcg  by mouth 2 (two) times daily.   CALCIUM 600+D PO Take 1 tablet by mouth 2 (two) times daily.   cholecalciferol 1000 units tablet Commonly known as:  VITAMIN D Take 6,000 Units by mouth daily.   doxycycline 40 MG capsule Commonly known as:  ORACEA Take 40 mg by mouth every morning.   DRISTAN NA Place 1 spray into the nose daily as needed (allergies).   ferrous sulfate 325 (65 FE) MG tablet Take 325 mg by mouth 2 (two) times daily with a meal.   fluticasone 50 MCG/ACT nasal spray Commonly known as:  FLONASE Place 2 sprays into the nose 2 (two) times daily.   furosemide 40 MG tablet Commonly known as:  LASIX Take 1 tablet (40 mg total) by mouth daily.   HORIZANT 600 MG Tbcr Generic drug:  Gabapentin Enacarbil TAKE 1 AND 1/2 TABLETS EVERY DAY    KLOR-CON M10 10 MEQ tablet Generic drug:  potassium chloride TAKE 1 TABLET BY MOUTH TWICE A DAY   levocetirizine 5 MG tablet Commonly known as:  XYZAL Take 5 mg by mouth every evening.   montelukast 10 MG tablet Commonly known as:  SINGULAIR Take 10 mg by mouth at bedtime.   ondansetron 4 MG tablet Commonly known as:  ZOFRAN Take 1 tablet (4 mg total) by mouth daily as needed for nausea or vomiting.   pantoprazole 40 MG tablet Commonly known as:  PROTONIX Take 40 mg by mouth daily.   PHENobarbital 64.8 MG tablet Commonly known as:  LUMINAL TAKE 2 TABLETS AT BEDTIME   pramipexole 0.25 MG tablet Commonly known as:  MIRAPEX Take 1 tablet (0.25 mg total) by mouth daily after supper.   Pramipexole Dihydrochloride 0.75 MG Tb24 Take 1 tablet (0.75 mg total) by mouth at bedtime.   rosuvastatin 20 MG tablet Commonly known as:  CRESTOR Take 1 tablet (20 mg total) by mouth daily. Reported on 07/02/2015 What changed:  when to take this  additional instructions   spironolactone 25 MG tablet Commonly known as:  ALDACTONE Take 12.5 mg by mouth daily.   traMADol 50 MG tablet Commonly known as:  ULTRAM Take 1 tablet (50 mg total) by mouth every 6 (six) hours as needed. 1-2 tabs   valsartan 80 MG tablet Commonly known as:  DIOVAN Take 2 tablets (160 mg total) by mouth daily. What changed:  how much to take      Follow-up Information    Jatavion Peaster, MD Follow up in 2 week(s).   Specialty:  Orthopedic Surgery Contact information: [address] [place] [address] [phone]           Signed: Toni ArthursHEWITT, Trase Bunda 05/13/2016, 6:58 AM

## 2016-05-13 NOTE — Plan of Care (Signed)
Problem: Safety: Goal: Ability to remain free from injury will improve Outcome: Progressing Safety precautions and fall preventions noted  Problem: Pain Managment: Goal: General experience of comfort will improve Outcome: Progressing Medicated for pain with moderate relief  Problem: Physical Regulation: Goal: Will remain free from infection Outcome: Progressing No s/s of infection noted  Problem: Tissue Perfusion: Goal: Risk factors for ineffective tissue perfusion will decrease Outcome: Progressing n s/s of dvt noted  Problem: Activity: Goal: Risk for activity intolerance will decrease Outcome: Progressing OOB to BR with assistance and tolerates well  Problem: Bowel/Gastric: Goal: Will not experience complications related to bowel motility Outcome: Progressing NO GASTRIC OR BOWEL ISSUES NOTED

## 2016-05-14 ENCOUNTER — Encounter (HOSPITAL_COMMUNITY): Payer: Self-pay | Admitting: Orthopedic Surgery

## 2016-05-19 LAB — AEROBIC/ANAEROBIC CULTURE (SURGICAL/DEEP WOUND)

## 2016-05-19 LAB — AEROBIC/ANAEROBIC CULTURE W GRAM STAIN (SURGICAL/DEEP WOUND)

## 2016-05-20 ENCOUNTER — Encounter (HOSPITAL_BASED_OUTPATIENT_CLINIC_OR_DEPARTMENT_OTHER): Payer: BC Managed Care – PPO | Attending: Surgery

## 2016-05-20 DIAGNOSIS — I10 Essential (primary) hypertension: Secondary | ICD-10-CM | POA: Insufficient documentation

## 2016-05-20 DIAGNOSIS — T8131XA Disruption of external operation (surgical) wound, not elsewhere classified, initial encounter: Secondary | ICD-10-CM | POA: Insufficient documentation

## 2016-05-20 DIAGNOSIS — L97313 Non-pressure chronic ulcer of right ankle with necrosis of muscle: Secondary | ICD-10-CM | POA: Insufficient documentation

## 2016-05-20 DIAGNOSIS — Y839 Surgical procedure, unspecified as the cause of abnormal reaction of the patient, or of later complication, without mention of misadventure at the time of the procedure: Secondary | ICD-10-CM | POA: Insufficient documentation

## 2016-05-20 DIAGNOSIS — G40909 Epilepsy, unspecified, not intractable, without status epilepticus: Secondary | ICD-10-CM | POA: Insufficient documentation

## 2016-05-20 DIAGNOSIS — G4730 Sleep apnea, unspecified: Secondary | ICD-10-CM | POA: Insufficient documentation

## 2016-05-20 DIAGNOSIS — I890 Lymphedema, not elsewhere classified: Secondary | ICD-10-CM | POA: Insufficient documentation

## 2016-05-20 DIAGNOSIS — D649 Anemia, unspecified: Secondary | ICD-10-CM | POA: Insufficient documentation

## 2016-05-26 ENCOUNTER — Other Ambulatory Visit: Payer: Self-pay | Admitting: Neurology

## 2016-05-29 ENCOUNTER — Telehealth: Payer: Self-pay | Admitting: Cardiology

## 2016-05-29 DIAGNOSIS — Z79899 Other long term (current) drug therapy: Secondary | ICD-10-CM

## 2016-05-29 NOTE — Telephone Encounter (Signed)
RX for luminal faxed to [facility] [address]. Received a receipt of confirmation.

## 2016-05-29 NOTE — Telephone Encounter (Signed)
Agree with extra 20 mg of Lasix daily.  Check BMET next week.

## 2016-05-29 NOTE — Telephone Encounter (Signed)
New Message     Ankles are grossly swollen, wound is not healing, pt is on water pill   pt has had surgery on leg and seen orthopedic last week, but swelling is getting worse, pt is in recliner   is there anything they can do for swelling  Lungs clear, bp good, heart rate is good , they just want to make sure there is not something they are missing

## 2016-05-29 NOTE — Telephone Encounter (Signed)
[facility]. Left my direct line to dial if after 5pm. Last BMET stable 2 weeks ago, pt on 40mg  furosemide daily - swelling ?surgery related - reasonable to try extra 1/2 tab furosemide daily over the weekend and track improvement?   Routed to [employer]

## 2016-06-01 ENCOUNTER — Other Ambulatory Visit: Payer: Self-pay

## 2016-06-01 MED ORDER — POTASSIUM CHLORIDE CRYS ER 10 MEQ PO TBCR: 10.0000 meq | EXTENDED_RELEASE_TABLET | Freq: Two times a day (BID) | ORAL | 1 refills | Status: DC

## 2016-06-01 NOTE — Telephone Encounter (Signed)
Spoke to [name] who informed me pt did take extra lasix over weekend - she'll recheck her BMET w home draw and send results via EPIC. Aware to call if further concerns.

## 2016-06-01 NOTE — Telephone Encounter (Signed)
Left msg advising on recommendations and to call if questions/clarification needed, BMET to [facility] following dose increase. [facility].

## 2016-06-03 DIAGNOSIS — L97313 Non-pressure chronic ulcer of right ankle with necrosis of muscle: Secondary | ICD-10-CM | POA: Diagnosis not present

## 2016-06-10 DIAGNOSIS — L97313 Non-pressure chronic ulcer of right ankle with necrosis of muscle: Secondary | ICD-10-CM | POA: Diagnosis not present

## 2016-06-17 ENCOUNTER — Encounter (HOSPITAL_BASED_OUTPATIENT_CLINIC_OR_DEPARTMENT_OTHER): Payer: BC Managed Care – PPO | Attending: Surgery

## 2016-06-17 DIAGNOSIS — T8131XA Disruption of external operation (surgical) wound, not elsewhere classified, initial encounter: Secondary | ICD-10-CM | POA: Diagnosis present

## 2016-06-17 DIAGNOSIS — Z6841 Body Mass Index (BMI) 40.0 and over, adult: Secondary | ICD-10-CM | POA: Insufficient documentation

## 2016-06-17 DIAGNOSIS — I890 Lymphedema, not elsewhere classified: Secondary | ICD-10-CM | POA: Diagnosis not present

## 2016-06-17 DIAGNOSIS — I10 Essential (primary) hypertension: Secondary | ICD-10-CM | POA: Insufficient documentation

## 2016-06-17 DIAGNOSIS — Y838 Other surgical procedures as the cause of abnormal reaction of the patient, or of later complication, without mention of misadventure at the time of the procedure: Secondary | ICD-10-CM | POA: Insufficient documentation

## 2016-06-17 DIAGNOSIS — G4730 Sleep apnea, unspecified: Secondary | ICD-10-CM | POA: Insufficient documentation

## 2016-06-24 DIAGNOSIS — T8131XA Disruption of external operation (surgical) wound, not elsewhere classified, initial encounter: Secondary | ICD-10-CM | POA: Diagnosis not present

## 2016-07-01 DIAGNOSIS — T8131XA Disruption of external operation (surgical) wound, not elsewhere classified, initial encounter: Secondary | ICD-10-CM | POA: Diagnosis not present

## 2016-07-08 DIAGNOSIS — T8131XA Disruption of external operation (surgical) wound, not elsewhere classified, initial encounter: Secondary | ICD-10-CM | POA: Diagnosis not present

## 2016-07-12 ENCOUNTER — Other Ambulatory Visit: Payer: Self-pay | Admitting: Neurology

## 2016-07-18 ENCOUNTER — Other Ambulatory Visit: Payer: Self-pay | Admitting: Cardiology

## 2016-07-29 ENCOUNTER — Encounter: Payer: Self-pay | Admitting: Podiatry

## 2016-07-29 ENCOUNTER — Ambulatory Visit (INDEPENDENT_AMBULATORY_CARE_PROVIDER_SITE_OTHER): Payer: BC Managed Care – PPO | Admitting: Podiatry

## 2016-07-29 VITALS — Ht 62.0 in | Wt 274.0 lb

## 2016-07-29 DIAGNOSIS — M79676 Pain in unspecified toe(s): Secondary | ICD-10-CM

## 2016-07-29 DIAGNOSIS — B351 Tinea unguium: Secondary | ICD-10-CM

## 2016-07-29 NOTE — Progress Notes (Signed)
   Subjective:    Patient ID: [name], female    DOB: 07-04-1950, 66 y.o.   MRN: [id number]  HPI this patient presents to the office with chief complaint of long, ingrowing toenails, especially big toes on both feet. .  She states that the nails are painful and ingrown causing pain when she walks and wears her shoes. She presents the office today for an evaluation and treatment of this condition    Review of Systems  Constitutional: Positive for unexpected weight change.  Cardiovascular: Positive for leg swelling.  Neurological: Positive for seizures.  Hematological: Bruises/bleeds easily.       Objective:   Physical Exam GENERAL APPEARANCE: Alert, conversant. Appropriately groomed. No acute distress.  VASCULAR: Pedal pulses are  palpable at  [facility] bilateral.  Capillary refill time is immediate to all digits,  Normal temperature gradient.  Digital hair growth is present bilateral  NEUROLOGIC: sensation is normal to 5.07 monofilament at 5/5 sites bilateral.  Light touch is intact bilateral, Muscle strength normal.  MUSCULOSKELETAL: acceptable muscle strength, tone and stability bilateral.  Intrinsic muscluature intact bilateral.  Rectus appearance of foot and digits noted bilateral.   DERMATOLOGIC: skin color, texture, and turgor are within normal limits.  No preulcerative lesions or ulcers  are seen, no interdigital maceration noted.  No open lesions present.   No drainage noted.  NAILS  Long thick disfigured pincer toenails hallux B/L         Assessment & Plan:  Onychomycosis B/L  IE  Debride nails  RTC 10 weeks   Helane GuntherGregory Braxdon Gappa DPM

## 2016-08-17 ENCOUNTER — Encounter (HOSPITAL_BASED_OUTPATIENT_CLINIC_OR_DEPARTMENT_OTHER): Payer: BC Managed Care – PPO | Attending: Surgery

## 2016-08-17 DIAGNOSIS — Y838 Other surgical procedures as the cause of abnormal reaction of the patient, or of later complication, without mention of misadventure at the time of the procedure: Secondary | ICD-10-CM | POA: Diagnosis not present

## 2016-08-17 DIAGNOSIS — I872 Venous insufficiency (chronic) (peripheral): Secondary | ICD-10-CM | POA: Diagnosis not present

## 2016-08-17 DIAGNOSIS — L97313 Non-pressure chronic ulcer of right ankle with necrosis of muscle: Secondary | ICD-10-CM | POA: Diagnosis not present

## 2016-08-17 DIAGNOSIS — G4730 Sleep apnea, unspecified: Secondary | ICD-10-CM | POA: Diagnosis not present

## 2016-08-17 DIAGNOSIS — I890 Lymphedema, not elsewhere classified: Secondary | ICD-10-CM | POA: Insufficient documentation

## 2016-08-17 DIAGNOSIS — T8131XA Disruption of external operation (surgical) wound, not elsewhere classified, initial encounter: Secondary | ICD-10-CM | POA: Insufficient documentation

## 2016-08-17 DIAGNOSIS — Y9289 Other specified places as the place of occurrence of the external cause: Secondary | ICD-10-CM | POA: Diagnosis not present

## 2016-08-17 DIAGNOSIS — Z87891 Personal history of nicotine dependence: Secondary | ICD-10-CM | POA: Insufficient documentation

## 2016-08-17 DIAGNOSIS — I10 Essential (primary) hypertension: Secondary | ICD-10-CM | POA: Diagnosis not present

## 2016-08-17 DIAGNOSIS — L03115 Cellulitis of right lower limb: Secondary | ICD-10-CM | POA: Insufficient documentation

## 2016-08-18 ENCOUNTER — Encounter (HOSPITAL_BASED_OUTPATIENT_CLINIC_OR_DEPARTMENT_OTHER): Payer: BC Managed Care – PPO | Attending: Internal Medicine

## 2016-08-19 LAB — AEROBIC CULTURE W GRAM STAIN (SUPERFICIAL SPECIMEN): Culture: NO GROWTH

## 2016-08-19 LAB — AEROBIC CULTURE  (SUPERFICIAL SPECIMEN)

## 2016-08-21 DIAGNOSIS — L97313 Non-pressure chronic ulcer of right ankle with necrosis of muscle: Secondary | ICD-10-CM | POA: Diagnosis not present

## 2016-08-28 DIAGNOSIS — L97313 Non-pressure chronic ulcer of right ankle with necrosis of muscle: Secondary | ICD-10-CM | POA: Diagnosis not present

## 2016-09-01 ENCOUNTER — Other Ambulatory Visit: Payer: Self-pay | Admitting: Neurology

## 2016-09-03 DIAGNOSIS — L97313 Non-pressure chronic ulcer of right ankle with necrosis of muscle: Secondary | ICD-10-CM | POA: Diagnosis not present

## 2016-09-08 ENCOUNTER — Ambulatory Visit (INDEPENDENT_AMBULATORY_CARE_PROVIDER_SITE_OTHER): Payer: BC Managed Care – PPO | Admitting: Adult Health

## 2016-09-08 ENCOUNTER — Encounter: Payer: Self-pay | Admitting: Adult Health

## 2016-09-08 ENCOUNTER — Telehealth: Payer: Self-pay

## 2016-09-08 VITALS — BP 136/66 | HR 70 | Ht 62.0 in

## 2016-09-08 DIAGNOSIS — G2581 Restless legs syndrome: Secondary | ICD-10-CM | POA: Diagnosis not present

## 2016-09-08 DIAGNOSIS — R569 Unspecified convulsions: Secondary | ICD-10-CM | POA: Diagnosis not present

## 2016-09-08 DIAGNOSIS — Z9989 Dependence on other enabling machines and devices: Secondary | ICD-10-CM | POA: Diagnosis not present

## 2016-09-08 DIAGNOSIS — G4733 Obstructive sleep apnea (adult) (pediatric): Secondary | ICD-10-CM

## 2016-09-08 DIAGNOSIS — Z5181 Encounter for therapeutic drug level monitoring: Secondary | ICD-10-CM

## 2016-09-08 MED ORDER — PHENOBARBITAL 64.8 MG PO TABS: ORAL_TABLET | ORAL | 5 refills | Status: DC

## 2016-09-08 NOTE — Telephone Encounter (Signed)
Dr. Vickey Huger completed pt's DMV form and signed it. I gave to MR to process.

## 2016-09-08 NOTE — Progress Notes (Signed)
I agree with the assessment and plan as directed by NP .The patient is known to me .   Asser Lucena, MD  
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
## 2016-09-08 NOTE — Patient Instructions (Signed)
Continue phenobarbital Continue mirapex and horizant for RLS Use CPAP nightly  Blood work today If you have any seizure events please let us know.

## 2016-09-09 ENCOUNTER — Telehealth: Payer: Self-pay | Admitting: *Deleted

## 2016-09-09 ENCOUNTER — Telehealth: Payer: Self-pay | Admitting: Neurology

## 2016-09-09 DIAGNOSIS — Z0289 Encounter for other administrative examinations: Secondary | ICD-10-CM

## 2016-09-09 LAB — COMPREHENSIVE METABOLIC PANEL
A/G RATIO: 1.5 (ref 1.2–2.2)
ALT: 46 IU/L — AB (ref 0–32)
AST: 46 IU/L — AB (ref 0–40)
Albumin: 4 g/dL (ref 3.6–4.8)
Alkaline Phosphatase: 114 IU/L (ref 39–117)
BILIRUBIN TOTAL: 0.3 mg/dL (ref 0.0–1.2)
BUN/Creatinine Ratio: 25 (ref 12–28)
BUN: 18 mg/dL (ref 8–27)
CO2: 25 mmol/L (ref 18–29)
Calcium: 9.8 mg/dL (ref 8.7–10.3)
Chloride: 104 mmol/L (ref 96–106)
Creatinine, Ser: 0.71 mg/dL (ref 0.57–1.00)
GFR calc Af Amer: 104 mL/min/{1.73_m2} (ref >59)
GFR calc non Af Amer: 90 mL/min/{1.73_m2} (ref >59)
GLOBULIN, TOTAL: 2.7 g/dL (ref 1.5–4.5)
Glucose: 92 mg/dL (ref 65–99)
Potassium: 4.3 mmol/L (ref 3.5–5.2)
SODIUM: 143 mmol/L (ref 134–144)
Total Protein: 6.7 g/dL (ref 6.0–8.5)

## 2016-09-09 LAB — CBC WITH DIFFERENTIAL/PLATELET
BASOS ABS: 0 10*3/uL (ref 0.0–0.2)
Basos: 0 %
EOS (ABSOLUTE): 0.2 10*3/uL (ref 0.0–0.4)
EOS: 3 %
Hematocrit: 35 % (ref 34.0–46.6)
Hemoglobin: 12.2 g/dL (ref 11.1–15.9)
IMMATURE GRANULOCYTES: 0 %
Immature Grans (Abs): 0 10*3/uL (ref 0.0–0.1)
Lymphocytes Absolute: 2.4 10*3/uL (ref 0.7–3.1)
Lymphs: 35 %
MCH: 29.9 pg (ref 26.6–33.0)
MCHC: 34.9 g/dL (ref 31.5–35.7)
MCV: 86 fL (ref 79–97)
MONOS ABS: 0.5 10*3/uL (ref 0.1–0.9)
Monocytes: 8 %
NEUTROS PCT: 54 %
Neutrophils Absolute: 3.6 10*3/uL (ref 1.4–7.0)
PLATELETS: 193 10*3/uL (ref 150–379)
RBC: 4.08 x10E6/uL (ref 3.77–5.28)
RDW: 15.4 % (ref 12.3–15.4)
WBC: 6.8 10*3/uL (ref 3.4–10.8)

## 2016-09-09 LAB — PHENOBARBITAL LEVEL: PHENOBARBITAL, SERUM: 14 ug/mL — AB (ref 15–40)

## 2016-09-09 NOTE — Telephone Encounter (Signed)
-----   Message from Butch Penny, NP sent at 09/09/2016  3:13 PM EDT ----- Lab work relatively unremarkable. AST and ALT slightly elevated. We will recheck liver enzymes in 3 months. Please call the patient with results.

## 2016-09-09 NOTE — Telephone Encounter (Signed)
Called and spoke with patient about results per MM,NP note. Pt verbalized understanding.

## 2016-09-10 ENCOUNTER — Encounter (HOSPITAL_BASED_OUTPATIENT_CLINIC_OR_DEPARTMENT_OTHER): Payer: BC Managed Care – PPO | Attending: Internal Medicine

## 2016-09-10 DIAGNOSIS — I890 Lymphedema, not elsewhere classified: Secondary | ICD-10-CM | POA: Diagnosis not present

## 2016-09-10 DIAGNOSIS — G4730 Sleep apnea, unspecified: Secondary | ICD-10-CM | POA: Insufficient documentation

## 2016-09-10 DIAGNOSIS — L03115 Cellulitis of right lower limb: Secondary | ICD-10-CM | POA: Insufficient documentation

## 2016-09-10 DIAGNOSIS — G40909 Epilepsy, unspecified, not intractable, without status epilepticus: Secondary | ICD-10-CM | POA: Diagnosis not present

## 2016-09-10 DIAGNOSIS — L97313 Non-pressure chronic ulcer of right ankle with necrosis of muscle: Secondary | ICD-10-CM | POA: Diagnosis not present

## 2016-09-10 DIAGNOSIS — T8131XA Disruption of external operation (surgical) wound, not elsewhere classified, initial encounter: Secondary | ICD-10-CM | POA: Insufficient documentation

## 2016-09-10 DIAGNOSIS — D649 Anemia, unspecified: Secondary | ICD-10-CM | POA: Insufficient documentation

## 2016-09-10 DIAGNOSIS — I10 Essential (primary) hypertension: Secondary | ICD-10-CM | POA: Insufficient documentation

## 2016-09-10 DIAGNOSIS — Y839 Surgical procedure, unspecified as the cause of abnormal reaction of the patient, or of later complication, without mention of misadventure at the time of the procedure: Secondary | ICD-10-CM | POA: Insufficient documentation

## 2016-09-10 NOTE — Telephone Encounter (Signed)
I have faxed the luminal RX to the [facility] pharmacy. Received a receipt of confirmation.   I called pt to advise her of this. No answer, left a message asking her to call me back. If pt calls back, please advise her of this information.

## 2016-09-10 NOTE — Telephone Encounter (Signed)
Pt calling for an update on this medication, please call

## 2016-09-10 NOTE — Telephone Encounter (Signed)
Patient called office and has been notified of RN's message below. Patient voiced understanding and appreciation.

## 2016-09-17 DIAGNOSIS — L97313 Non-pressure chronic ulcer of right ankle with necrosis of muscle: Secondary | ICD-10-CM | POA: Diagnosis not present

## 2016-09-27 ENCOUNTER — Other Ambulatory Visit: Payer: Self-pay | Admitting: Neurology

## 2016-10-06 ENCOUNTER — Other Ambulatory Visit: Payer: Self-pay | Admitting: Neurology

## 2016-10-09 NOTE — Anesthesia Postprocedure Evaluation (Signed)
Anesthesia Post Note  Patient: [name]  Procedure(s) Performed: Procedure(s) (LRB): IRRIGATION AND DEBRIDEMENT right ankle wound; application of wound vac (Right) APPLICATION OF WOUND VAC (Right)     Anesthesia Post Evaluation  Last Vitals:  Vitals:   05/13/16 0020 05/13/16 0608  BP: (!) 114/55 120/68  Pulse: 72 63  Resp: 16 16  Temp: 36.1 C 36.1 C    Last Pain:  Vitals:   05/13/16 0934  TempSrc:   PainSc: 3                  Shelton SilvasKevin D Yun Gutierrez

## 2016-10-09 NOTE — Addendum Note (Signed)
Addendum  created 10/09/16 0823 by Santosha Jividen D, MD   Sign clinical note    

## 2016-10-20 ENCOUNTER — Other Ambulatory Visit: Payer: Self-pay | Admitting: Cardiology

## 2016-10-21 ENCOUNTER — Ambulatory Visit: Payer: BC Managed Care – PPO | Admitting: Podiatry

## 2016-10-22 ENCOUNTER — Ambulatory Visit (INDEPENDENT_AMBULATORY_CARE_PROVIDER_SITE_OTHER): Payer: BC Managed Care – PPO | Admitting: Cardiology

## 2016-10-22 ENCOUNTER — Encounter: Payer: Self-pay | Admitting: Cardiology

## 2016-10-22 VITALS — BP 150/72 | HR 76 | Ht 62.0 in | Wt 294.0 lb

## 2016-10-22 DIAGNOSIS — G4733 Obstructive sleep apnea (adult) (pediatric): Secondary | ICD-10-CM

## 2016-10-22 DIAGNOSIS — R55 Syncope and collapse: Secondary | ICD-10-CM

## 2016-10-22 DIAGNOSIS — I10 Essential (primary) hypertension: Secondary | ICD-10-CM | POA: Diagnosis not present

## 2016-10-22 DIAGNOSIS — R600 Localized edema: Secondary | ICD-10-CM | POA: Diagnosis not present

## 2016-10-22 DIAGNOSIS — I890 Lymphedema, not elsewhere classified: Secondary | ICD-10-CM | POA: Diagnosis not present

## 2016-10-22 DIAGNOSIS — Z9989 Dependence on other enabling machines and devices: Secondary | ICD-10-CM

## 2016-10-22 LAB — COMPREHENSIVE METABOLIC PANEL
ALT: 39 IU/L — ABNORMAL HIGH (ref 0–32)
AST: 38 IU/L (ref 0–40)
Albumin/Globulin Ratio: 1.6 (ref 1.2–2.2)
Albumin: 4.2 g/dL (ref 3.6–4.8)
Alkaline Phosphatase: 126 IU/L — ABNORMAL HIGH (ref 39–117)
BUN/Creatinine Ratio: 19 (ref 12–28)
BUN: 12 mg/dL (ref 8–27)
Bilirubin Total: 0.2 mg/dL (ref 0.0–1.2)
CALCIUM: 9.5 mg/dL (ref 8.7–10.3)
CO2: 22 mmol/L (ref 20–29)
CREATININE: 0.62 mg/dL (ref 0.57–1.00)
Chloride: 103 mmol/L (ref 96–106)
GFR calc Af Amer: 110 mL/min/{1.73_m2} (ref >59)
GFR, EST NON AFRICAN AMERICAN: 96 mL/min/{1.73_m2} (ref >59)
Globulin, Total: 2.6 g/dL (ref 1.5–4.5)
Glucose: 96 mg/dL (ref 65–99)
Potassium: 4.1 mmol/L (ref 3.5–5.2)
Sodium: 141 mmol/L (ref 134–144)
Total Protein: 6.8 g/dL (ref 6.0–8.5)

## 2016-10-22 LAB — PRO B NATRIURETIC PEPTIDE: NT-Pro BNP: 53 pg/mL (ref 0–287)

## 2016-10-22 MED ORDER — AMLODIPINE BESYLATE 10 MG PO TABS: 10.0000 mg | ORAL_TABLET | Freq: Every day | ORAL | 6 refills | Status: DC

## 2016-10-22 MED ORDER — SPIRONOLACTONE 25 MG PO TABS: 25.0000 mg | ORAL_TABLET | Freq: Every day | ORAL | 6 refills | Status: DC

## 2016-10-22 MED ORDER — VALSARTAN 160 MG PO TABS: 160.0000 mg | ORAL_TABLET | Freq: Every day | ORAL | 6 refills | Status: DC

## 2016-10-22 MED ORDER — METOLAZONE 5 MG PO TABS: 5.0000 mg | ORAL_TABLET | Freq: Every day | ORAL | 6 refills | Status: DC

## 2016-10-22 NOTE — Patient Instructions (Addendum)
medication changes   - DECREASE AMLODIPINE 10 MG  BY  MOUTH DAILY  --INCREASE VASALSARTAN 160 MG BY MOUTH DAILY  --INCREASE SPIROLACTONE 25 MG BY MOUTH DAILY   Wait until you received instruction after lab reuslts  Take metolazone 5 mg - 30 minutes before taking morning dose of fluid pill ( diuretic)    Labs today- [facility] BNP WILL CALL WITH MORE INSTRUCTIONS    Your physician recommends that you schedule a follow-up appointment in 2 WEEKS WITH AN EXTENDER.
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
## 2016-10-24 ENCOUNTER — Encounter: Payer: Self-pay | Admitting: Cardiology

## 2016-10-24 NOTE — Assessment & Plan Note (Signed)
Very difficult to diuresis. She still needs to wear compression stockings or at least use the Ace wraps. Until her swelling has decreased somewhat think we have to use the Ace wraps. Continue to wrap them with increased dose of Lasix. Continue foot elevation.  She will return in 2 weeks to see an APP - at that time plan will be to determine if she stays on 40 mg twice a day Lasix and maybe backs off on Zaroxolyn to 3 days a week or stays daily. We'll also ask the PA/NP to provide brochure for the leg elevation pillow (patented by Dr. Woodfin Ganjahris Dixon)

## 2016-10-24 NOTE — Assessment & Plan Note (Signed)
Significant edema that seems to be worse than before. She is no longer able to wear compression stockings, using Ace wraps now. I'm not sure how much of this is related to her heart, probably more related to lymphedema.  If it is cardiac, be more right heart related and left heart related which remain related to pulmonary hypertension, but this has not been borne out by evaluation and past.  Plan: We will check a BNP level to determine if there is any heart failure component, but for now we will find a prescription for when necessary Zaroxolyn - but will have her hold off on taking additional diuretic until I can see what her chemistry panel looks like.  For now plan will be to increase spironolactone to 20 mg daily. We will also reduce her amlodipine to 10 mg daily because I don't know that there is much benefit from 20 mg. - Increase valsartan to 160 mg for better blood pressure control.  Pending BMP & BNP results will likely increase her Lasix to 40 twice a day with Zaroxolyn prior to the first dose. - I would continue this for about a week, and then reassess labs..[name]

## 2016-10-24 NOTE — Assessment & Plan Note (Signed)
I suspect that some of her edema is related to obesity ventilation syndrome with some elevated pressures. We could consider checking an echocardiogram of her BNP is elevated.

## 2016-10-24 NOTE — Assessment & Plan Note (Signed)
Poorly controlled again. I'm not sure I fully understand her medications. Next I plan: Reduce amlodipine to 10 mg, increase valsartan to 160 mg (would potentially even eventually increased to 320 mg in 2 week f/u) Increased spironolactone to 25 mg daily. Hopefully with increased Lasix dose, this will also help her blood pressure.

## 2016-10-24 NOTE — Assessment & Plan Note (Signed)
Some of her Weight is probably fluid related, but she still remains morbidly obese. Until she can get up walk around, she is not able to burn calories. She needs to reduce by mouth intake. Probably would benefit from nutrition counseling

## 2016-10-24 NOTE — Assessment & Plan Note (Signed)
Still no clear etiology, but was probably related to active UTI at the time. No further episodes.

## 2016-10-27 ENCOUNTER — Telehealth: Payer: Self-pay | Admitting: *Deleted

## 2016-10-27 MED ORDER — POTASSIUM CHLORIDE CRYS ER 10 MEQ PO TBCR
20.0000 meq | EXTENDED_RELEASE_TABLET | Freq: Two times a day (BID) | ORAL | 6 refills | Status: DC
Start: 2016-10-27 — End: 2016-11-06

## 2016-10-27 MED ORDER — METOLAZONE 5 MG PO TABS: 5.0000 mg | ORAL_TABLET | Freq: Every day | ORAL | 6 refills | Status: DC

## 2016-10-27 MED ORDER — POTASSIUM CHLORIDE CRYS ER 10 MEQ PO TBCR: 20.0000 meq | EXTENDED_RELEASE_TABLET | Freq: Two times a day (BID) | ORAL | 6 refills | Status: DC

## 2016-10-27 NOTE — Telephone Encounter (Signed)
-----   Message from Marykay Lexavid W Harding, MD sent at 10/24/2016 10:47 PM EDT ----- ProBNP level was quite low. This would argue against the swelling being related to heart failure. It would suggest is more related  to a noncardiac cause.  Chemistry panel looks pretty good. Renal function (BUN/creatinine) is stable. Potassium is also stable.  For now, lets increase Lasix to 40 mg PO BID - take the new med (Zaroxolyn) 30 min before 1st dose. Do this daily for the 1st week, then reduce to 3 days per week until seen in follow-up. Will need to take double dose of Klor-con daily (2 tab twice daily).  Also, I am asking Jasmine DecemberSharon to provide the information on the Leg Elevation pillow - ask for the brochure @ 2 week f/u.  Bryan Lemmaavid Harding, MD

## 2016-10-27 NOTE — Telephone Encounter (Signed)
Spoke to patient. Result given . Verbalized understanding E-SENT PRESCRIPTION PAMPHLET MAILED TO PATIENT. PATIENT STATES SHE IS USING ACE BANDAGE ON HER LEGS.

## 2016-11-05 ENCOUNTER — Ambulatory Visit (INDEPENDENT_AMBULATORY_CARE_PROVIDER_SITE_OTHER): Payer: BC Managed Care – PPO | Admitting: Physician Assistant

## 2016-11-05 ENCOUNTER — Encounter: Payer: Self-pay | Admitting: Physician Assistant

## 2016-11-05 VITALS — BP 106/60 | HR 70 | Ht 62.0 in | Wt 287.0 lb

## 2016-11-05 DIAGNOSIS — I10 Essential (primary) hypertension: Secondary | ICD-10-CM | POA: Diagnosis not present

## 2016-11-05 DIAGNOSIS — G4733 Obstructive sleep apnea (adult) (pediatric): Secondary | ICD-10-CM

## 2016-11-05 DIAGNOSIS — R600 Localized edema: Secondary | ICD-10-CM | POA: Diagnosis not present

## 2016-11-05 DIAGNOSIS — Z79899 Other long term (current) drug therapy: Secondary | ICD-10-CM | POA: Diagnosis not present

## 2016-11-05 LAB — BASIC METABOLIC PANEL
BUN / CREAT RATIO: 32 — AB (ref 12–28)
BUN: 31 mg/dL — AB (ref 8–27)
CALCIUM: 9.8 mg/dL (ref 8.7–10.3)
CHLORIDE: 90 mmol/L — AB (ref 96–106)
CO2: 27 mmol/L (ref 20–29)
CREATININE: 0.98 mg/dL (ref 0.57–1.00)
GFR, EST AFRICAN AMERICAN: 71 mL/min/{1.73_m2} (ref >59)
GFR, EST NON AFRICAN AMERICAN: 61 mL/min/{1.73_m2} (ref >59)
Glucose: 103 mg/dL — ABNORMAL HIGH (ref 65–99)
Potassium: 3 mmol/L — ABNORMAL LOW (ref 3.5–5.2)
Sodium: 136 mmol/L (ref 134–144)

## 2016-11-05 MED ORDER — METOLAZONE 5 MG PO TABS: 5.0000 mg | ORAL_TABLET | ORAL | 6 refills | Status: DC

## 2016-11-05 NOTE — Patient Instructions (Signed)
Medication Instructions:  DECREASE METOLAZONE TO Monday, Wednesday AND Friday ONLY  If you need a refill on your cardiac medications before your next appointment, please call your pharmacy.  Labwork: BMP TODAY HERE IN OUR OFFICE AT [facility]  Follow-Up: Your physician wants you to follow-up in: 2-3 MONTHS WITH [facility]   [facility]
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
## 2016-11-06 ENCOUNTER — Telehealth: Payer: Self-pay | Admitting: *Deleted

## 2016-11-06 DIAGNOSIS — Z79899 Other long term (current) drug therapy: Secondary | ICD-10-CM

## 2016-11-06 MED ORDER — POTASSIUM CHLORIDE CRYS ER 10 MEQ PO TBCR: 20.0000 meq | EXTENDED_RELEASE_TABLET | Freq: Three times a day (TID) | ORAL | 5 refills | Status: DC

## 2016-11-06 NOTE — Telephone Encounter (Signed)
-----   Message from AndrewsHao Meng, [place] sent at 11/06/2016 12:59 PM EDT ----- Potassium low, likely related to aggressive diuresis recently, increase KCl to 20 meq TID and repeat BMET in 1 week

## 2016-11-06 NOTE — Telephone Encounter (Signed)
Results and recommendations discussed with patient, who verbalized understanding and thanks. BMET ordered and Potassium Rx called to her preferred pharmacy for new dosage.

## 2016-11-08 ENCOUNTER — Other Ambulatory Visit: Payer: Self-pay | Admitting: Neurology

## 2016-11-08 DIAGNOSIS — G2581 Restless legs syndrome: Secondary | ICD-10-CM

## 2016-11-09 ENCOUNTER — Other Ambulatory Visit: Payer: Self-pay | Admitting: Neurology

## 2016-11-09 DIAGNOSIS — G2581 Restless legs syndrome: Secondary | ICD-10-CM

## 2016-11-12 LAB — BASIC METABOLIC PANEL
BUN / CREAT RATIO: 29 — AB (ref 12–28)
BUN: 27 mg/dL (ref 8–27)
CO2: 25 mmol/L (ref 20–29)
CREATININE: 0.93 mg/dL (ref 0.57–1.00)
Calcium: 9.8 mg/dL (ref 8.7–10.3)
Chloride: 95 mmol/L — ABNORMAL LOW (ref 96–106)
GFR calc non Af Amer: 65 mL/min/{1.73_m2} (ref >59)
GFR, EST AFRICAN AMERICAN: 75 mL/min/{1.73_m2} (ref >59)
GLUCOSE: 102 mg/dL — AB (ref 65–99)
Potassium: 3.3 mmol/L — ABNORMAL LOW (ref 3.5–5.2)
SODIUM: 139 mmol/L (ref 134–144)

## 2016-11-13 ENCOUNTER — Encounter: Payer: Self-pay | Admitting: *Deleted

## 2016-11-13 ENCOUNTER — Telehealth: Payer: Self-pay | Admitting: *Deleted

## 2016-11-13 DIAGNOSIS — E876 Hypokalemia: Secondary | ICD-10-CM

## 2016-11-13 DIAGNOSIS — Z79899 Other long term (current) drug therapy: Secondary | ICD-10-CM

## 2016-11-13 NOTE — Telephone Encounter (Signed)
Pt advised on recommendations via telephone, letter w instructions, results, and future lab orders mailed to her home address.

## 2016-11-13 NOTE — Telephone Encounter (Signed)
-----   Message from GallinaHao Meng, [place] sent at 11/13/2016  1:24 PM EDT ----- Potassium still low, increase KCl to 40 meq BID (from 20meq TID) and recheck in 1-2 month.

## 2016-11-16 ENCOUNTER — Telehealth: Payer: Self-pay | Admitting: Cardiology

## 2016-11-16 ENCOUNTER — Other Ambulatory Visit: Payer: Self-pay | Admitting: *Deleted

## 2016-11-16 DIAGNOSIS — Z79899 Other long term (current) drug therapy: Secondary | ICD-10-CM

## 2016-11-16 MED ORDER — POTASSIUM CHLORIDE CRYS ER 10 MEQ PO TBCR: 40.0000 meq | EXTENDED_RELEASE_TABLET | Freq: Two times a day (BID) | ORAL | 3 refills | Status: DC

## 2016-11-16 NOTE — Telephone Encounter (Signed)
Spoke with pt she states that s for the last 2 days she has been taking lasix 40mg  daily she states that her weight is up 5 # and she will increase to 80mg  and call back if this does not produce results for her. She states that she is taking metolazone today as ordered and will take lasix 80mg  today also.

## 2016-11-16 NOTE — Telephone Encounter (Signed)
Yes for today, but if recurrent volume overload would attempt a trial to increase lasix permanently follow by 1 week BMET

## 2016-11-16 NOTE — Telephone Encounter (Signed)
Pt notified she will be in next week for blood draw

## 2016-11-16 NOTE — Telephone Encounter (Signed)
Pt went back to 40 mg QD,Pt is going to take 80mg  in the am and see how that works for her. Please advise if this is ok

## 2016-11-16 NOTE — Telephone Encounter (Signed)
I recently reduced her metolazone to three time weekly. Sounds like she may need more diuretic, however I would agree increase lasix is better than increase metolazone.  Please verify if she is taking lasix today or BID, if she takes it daily, she will need to increase it to 40mg  BID,and check BMET in 1 week. However if she is already on 40mg  BID at home recently, then will need to increase to 80mg  BID and recheck BMET in 1 week.

## 2016-11-16 NOTE — Telephone Encounter (Signed)
Please call,concerning the dose of Lasix.Also needs to talk about her weight gain,Meng told her to keep a chart on her weight gain.

## 2016-11-20 ENCOUNTER — Telehealth: Payer: Self-pay | Admitting: Cardiology

## 2016-11-20 NOTE — Telephone Encounter (Signed)
Pt says she is on Lasix and was told to monitor her weight. Her weight is going up and down as much 3or4 pounds a day.

## 2016-11-20 NOTE — Telephone Encounter (Signed)
PATIENT WANTED TO CONFIRM MEDICATION  POTASSIUM 40 MEQ  TWICE A DAY.  PATIENT IS TAKING FUROSEMIDE 80 MG ( 2 X 40 MG) AT THE SAME TIME IN THE MORNINGS METOLAZONE M-W-F.  INFORM PATIENT TO TAKE FUROSEMIDE 40 MG IN THE MORNING AND 40 MG EARLY AFTERNOON. TAKE POTASSIUM AT THAT TIME.  CONTINUE WITH CURRENT MEDICATION- WILL DEFER TO DR HARDING OR MENG IF MEDICATION NEED TO BE CHANGE DUE TO SMALL WEIGHT CHANGES.  RN INFORMED TO CONTINUE - WEIGHT FLUCTUATE ONLY 3 TO 4 LBS

## 2016-11-22 NOTE — Telephone Encounter (Signed)
Is just as easy to take the medicine 80 mg once a day versus 40 twice a day. The intention was to monitor weights. If weight goes up more than 4 pounds, she would take 80 mg the morning and 40 in the afternoon until the weight goes back down to baseline.  If there is no response to the increased dose of Lasix, the next morning (regardless of whether his schedule not) she should take the metolazone 30 minutes prior to her Lasix.  Bryan Lemmaavid Harding, MD

## 2016-11-23 NOTE — Telephone Encounter (Signed)
Spoke to patient aware of changes --aware of needing BMET this week --will come on Thursday.

## 2016-11-26 LAB — BASIC METABOLIC PANEL
BUN / CREAT RATIO: 14 (ref 12–28)
BUN: 12 mg/dL (ref 8–27)
CHLORIDE: 98 mmol/L (ref 96–106)
CO2: 21 mmol/L (ref 20–29)
Calcium: 9.3 mg/dL (ref 8.7–10.3)
Creatinine, Ser: 0.87 mg/dL (ref 0.57–1.00)
GFR calc non Af Amer: 71 mL/min/{1.73_m2} (ref >59)
GFR, EST AFRICAN AMERICAN: 81 mL/min/{1.73_m2} (ref >59)
GLUCOSE: 102 mg/dL — AB (ref 65–99)
POTASSIUM: 3.8 mmol/L (ref 3.5–5.2)
Sodium: 139 mmol/L (ref 134–144)

## 2017-01-05 ENCOUNTER — Telehealth: Payer: Self-pay | Admitting: Cardiology

## 2017-01-05 NOTE — Telephone Encounter (Signed)
Spoke with pt she states that she has gained 19# in 8 days. She states that she has been taking her weight daily, taking lasix 40mg  BID and metolazone 5mg  3x Qweek(all other medications as ordered) despite taking the listed medication. She denies any SOB or any other sx. Please advise

## 2017-01-05 NOTE — Telephone Encounter (Signed)
Pt notified appt scheduled 01-22-17 at 8am to discus with Wynema Birch

## 2017-01-05 NOTE — Telephone Encounter (Signed)
New message    Pt is calling.   Pt c/o swelling: STAT is pt has developed SOB within 24 hours  1. How long have you been experiencing swelling? A while. Pt states that she has gained 19 lbs in 8 days.  2. Where is the swelling located? Mainly in right leg  3.  Are you currently taking a "fluid pill"? Yes   4.  Are you currently SOB? No   5.  Have you traveled recently? No

## 2017-01-05 NOTE — Telephone Encounter (Signed)
Increase metolazone to every other day, schedule close outpatient visit with me. Bring weight diary and medication bottles.

## 2017-01-08 ENCOUNTER — Telehealth: Payer: Self-pay | Admitting: Cardiology

## 2017-01-08 DIAGNOSIS — R635 Abnormal weight gain: Secondary | ICD-10-CM | POA: Insufficient documentation

## 2017-01-08 DIAGNOSIS — R0609 Other forms of dyspnea: Principal | ICD-10-CM

## 2017-01-08 DIAGNOSIS — Z79899 Other long term (current) drug therapy: Secondary | ICD-10-CM | POA: Insufficient documentation

## 2017-01-08 NOTE — Telephone Encounter (Signed)
PATIENT AWARE - BMP ORDERED  - WILL OBTAIN AFTER APPOINTMENT ON 01/14/17

## 2017-01-08 NOTE — Telephone Encounter (Signed)
New Message  Pt c/o Shortness Of Breath: STAT if SOB developed within the last 24 hours or pt is noticeably SOB on the phone  1. Are you currently SOB (can you hear that pt is SOB on the phone)? Yes   2. How long have you been experiencing SOB? today  3. Are you SOB when sitting or when up moving around? Moving around  4. Are you currently experiencing any other symptoms? no

## 2017-01-08 NOTE — Telephone Encounter (Signed)
Agree, I need to see her early to make further medication adjustment. She will have a BMET on the day of visit.

## 2017-01-08 NOTE — Telephone Encounter (Signed)
PATIENT STATES SHE HAS NOTICE BEING SHORTNESS OF BREATH WITH ANY WALKING FIR THE LAST 4 DAYS  CURRENT WEIGHT 302 LBS, YESTERDAYS 298 LBS  NO CHEST PAIN  SOME DIARRHEA  CURRENTLY  TAKING LASIX 40 MG TWICE A DAY AND 5 MG  METOLAZONE  EVERY OTHER DAY.   TODAY IS THE DAY SHE TOOK METOLAZONE.    PATIENT AWARE WILL DEFER  to EXTENDER FOR FURTHER  INSTRUCTION move appointment  Up to next week 01/14/17

## 2017-01-09 HISTORY — PX: NM MYOVIEW LTD: HXRAD82

## 2017-01-14 ENCOUNTER — Ambulatory Visit (INDEPENDENT_AMBULATORY_CARE_PROVIDER_SITE_OTHER): Payer: Medicare Other | Admitting: Cardiology

## 2017-01-14 ENCOUNTER — Encounter: Payer: Self-pay | Admitting: Cardiology

## 2017-01-14 VITALS — BP 138/70 | HR 72 | Ht 62.0 in | Wt 297.0 lb

## 2017-01-14 DIAGNOSIS — I890 Lymphedema, not elsewhere classified: Secondary | ICD-10-CM | POA: Diagnosis not present

## 2017-01-14 DIAGNOSIS — I10 Essential (primary) hypertension: Secondary | ICD-10-CM

## 2017-01-14 DIAGNOSIS — R0609 Other forms of dyspnea: Secondary | ICD-10-CM

## 2017-01-14 DIAGNOSIS — R600 Localized edema: Secondary | ICD-10-CM | POA: Diagnosis not present
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
## 2017-01-14 NOTE — Patient Instructions (Addendum)
SCHEDULE  Your physician has requested that you have a lower extremity venous duplex for DVT and reflux. This test is an ultrasound of the veins in the legs. It looks at venous blood flow that carries blood from the heart to the legs. Allow one hour for a Lower Venous exam.  There are no restrictions or special instructions.  Schedule  Your physician has requested that you have a lexiscan myoview. For further information please visit [email or web address]. Please follow instruction sheet, as given.    Your physician recommends that you schedule a follow-up appointment SEPT 27 2018 WITH DR HARDING.

## 2017-01-15 LAB — BASIC METABOLIC PANEL
BUN/Creatinine Ratio: 30 — ABNORMAL HIGH (ref 12–28)
BUN: 25 mg/dL (ref 8–27)
CALCIUM: 9.9 mg/dL (ref 8.7–10.3)
CHLORIDE: 93 mmol/L — AB (ref 96–106)
CO2: 26 mmol/L (ref 20–29)
Creatinine, Ser: 0.82 mg/dL (ref 0.57–1.00)
GFR calc non Af Amer: 75 mL/min/{1.73_m2} (ref >59)
GFR, EST AFRICAN AMERICAN: 87 mL/min/{1.73_m2} (ref >59)
Glucose: 93 mg/dL (ref 65–99)
Potassium: 3.3 mmol/L — ABNORMAL LOW (ref 3.5–5.2)
Sodium: 136 mmol/L (ref 134–144)

## 2017-01-18 ENCOUNTER — Other Ambulatory Visit: Payer: Self-pay

## 2017-01-18 DIAGNOSIS — E876 Hypokalemia: Secondary | ICD-10-CM

## 2017-01-19 ENCOUNTER — Telehealth (HOSPITAL_COMMUNITY): Payer: Self-pay | Admitting: *Deleted

## 2017-01-19 NOTE — Telephone Encounter (Signed)
Close encounter 

## 2017-01-20 ENCOUNTER — Telehealth: Payer: Self-pay | Admitting: *Deleted

## 2017-01-20 ENCOUNTER — Encounter: Payer: Self-pay | Admitting: Cardiology

## 2017-01-20 NOTE — Assessment & Plan Note (Signed)
He still has his whole edema. The BNP level was not all that elevated which argues against this being heart failure related. Will check for DVT and/or venous insufficiency.  - she needs to be of use if not compression stockings at least support socks.  She did not toleratet the increase of valsartan in addition to spironolactone. I recommend using Zaroxolyn every other day - need to reassess with chemistry panel to determine the best options for diuretic.

## 2017-01-20 NOTE — Assessment & Plan Note (Signed)
lood pressure is actually a little bit  Better on current dose of amlodipine and valsartan -> she is actually only taking 80 mg BIDalong with spironolactone 25 mg.she didn't tolerate increasing the dose of valsartan.  When I see her back, we will need to discuss converting from valsartan to a different ARB because the recall issues.

## 2017-01-20 NOTE — Assessment & Plan Note (Signed)
Gain this continues to be difficult to treat with diuretic. We'll get her into some type of support stocking even if she still uses Ace wrap, perhaps the zipper support. Will help.  We are checking for any possible targets for ablation if this is potentially related to venous stasis.

## 2017-01-20 NOTE — Assessment & Plan Note (Signed)
Probably playing a role with her dyspnea, although her weight is also affected by edema. We need her to be able to ambulate to lose weight and also help with venous drainage.

## 2017-01-20 NOTE — Assessment & Plan Note (Signed)
This is a limit was actually a new finding for her that I was surprised about. It's probably because of deconditioning, however she says that when she feels short of breath she feels like her chest is tight and I'm concerned that this could be potentially related to ischemia. As I'm sure she would not walk on treadmill, we're limited to imaging studies for stress testing and therefore will use Lexiscan Myoview.

## 2017-01-20 NOTE — Telephone Encounter (Signed)
Started PA for [employer] on [employer].

## 2017-01-21 ENCOUNTER — Ambulatory Visit (HOSPITAL_COMMUNITY)
Admission: RE | Admit: 2017-01-21 | Discharge: 2017-01-21 | Disposition: A | Discharge status: Home / Self Care | Payer: Medicare Other | Source: Ambulatory Visit | Attending: Cardiology | Admitting: Cardiology

## 2017-01-21 DIAGNOSIS — I10 Essential (primary) hypertension: Secondary | ICD-10-CM | POA: Diagnosis not present

## 2017-01-21 DIAGNOSIS — G4733 Obstructive sleep apnea (adult) (pediatric): Secondary | ICD-10-CM | POA: Diagnosis not present

## 2017-01-21 DIAGNOSIS — I2510 Atherosclerotic heart disease of native coronary artery without angina pectoris: Secondary | ICD-10-CM | POA: Diagnosis not present

## 2017-01-21 DIAGNOSIS — R5383 Other fatigue: Secondary | ICD-10-CM | POA: Diagnosis not present

## 2017-01-21 DIAGNOSIS — I779 Disorder of arteries and arterioles, unspecified: Secondary | ICD-10-CM | POA: Diagnosis not present

## 2017-01-21 DIAGNOSIS — E669 Obesity, unspecified: Secondary | ICD-10-CM | POA: Diagnosis not present

## 2017-01-21 DIAGNOSIS — Z6841 Body Mass Index (BMI) 40.0 and over, adult: Secondary | ICD-10-CM | POA: Insufficient documentation

## 2017-01-21 DIAGNOSIS — R600 Localized edema: Secondary | ICD-10-CM | POA: Diagnosis not present

## 2017-01-21 DIAGNOSIS — R002 Palpitations: Secondary | ICD-10-CM | POA: Insufficient documentation

## 2017-01-21 DIAGNOSIS — R0609 Other forms of dyspnea: Secondary | ICD-10-CM | POA: Insufficient documentation

## 2017-01-21 DIAGNOSIS — Z87891 Personal history of nicotine dependence: Secondary | ICD-10-CM | POA: Insufficient documentation

## 2017-01-21 MED ORDER — REGADENOSON 0.4 MG/5ML IV SOLN
0.4000 mg | Freq: Once | INTRAVENOUS | Status: AC
  Administered 2017-01-21: 0.4 mg via INTRAVENOUS

## 2017-01-21 MED ORDER — TECHNETIUM TC 99M TETROFOSMIN IV KIT
30.4000 | PACK | Freq: Once | INTRAVENOUS | Status: AC | PRN
  Administered 2017-01-21: 30.4 via INTRAVENOUS
  Filled 2017-01-21: qty 31

## 2017-01-21 NOTE — Telephone Encounter (Signed)
[facility] stating information received on CMM was incomplete. Papers completed [facility].

## 2017-01-22 ENCOUNTER — Ambulatory Visit (HOSPITAL_COMMUNITY)
Admission: RE | Admit: 2017-01-22 | Discharge: 2017-01-22 | Disposition: A | Discharge status: Home / Self Care | Payer: Medicare Other | Source: Ambulatory Visit | Attending: Cardiology | Admitting: Cardiology

## 2017-01-22 ENCOUNTER — Ambulatory Visit (HOSPITAL_COMMUNITY): Admission: RE | Admit: 2017-01-22 | Payer: Medicare Other | Source: Ambulatory Visit

## 2017-01-22 ENCOUNTER — Ambulatory Visit: Payer: BC Managed Care – PPO | Admitting: Physician Assistant

## 2017-01-22 DIAGNOSIS — I10 Essential (primary) hypertension: Secondary | ICD-10-CM | POA: Insufficient documentation

## 2017-01-22 DIAGNOSIS — R600 Localized edema: Secondary | ICD-10-CM | POA: Insufficient documentation

## 2017-01-22 DIAGNOSIS — R0609 Other forms of dyspnea: Secondary | ICD-10-CM | POA: Diagnosis not present

## 2017-01-22 DIAGNOSIS — E785 Hyperlipidemia, unspecified: Secondary | ICD-10-CM | POA: Insufficient documentation

## 2017-01-22 DIAGNOSIS — M7989 Other specified soft tissue disorders: Secondary | ICD-10-CM | POA: Diagnosis not present

## 2017-01-22 LAB — MYOCARDIAL PERFUSION IMAGING
LV sys vol: 24 mL
LVDIAVOL: 88 mL (ref 46–106)
Peak HR: 78 {beats}/min
Rest HR: 66 {beats}/min
SDS: 4
SRS: 5
SSS: 9
TID: 0.94

## 2017-01-22 MED ORDER — TECHNETIUM TC 99M TETROFOSMIN IV KIT
30.1000 | PACK | Freq: Once | INTRAVENOUS | Status: AC | PRN
  Administered 2017-01-22: 30.1 via INTRAVENOUS

## 2017-01-25 ENCOUNTER — Other Ambulatory Visit: Payer: Self-pay | Admitting: Neurology

## 2017-01-25 DIAGNOSIS — G2581 Restless legs syndrome: Secondary | ICD-10-CM

## 2017-01-25 MED ORDER — PRAMIPEXOLE DIHYDROCHLORIDE 0.25 MG PO TABS: 0.2500 mg | ORAL_TABLET | ORAL | 3 refills | Status: DC

## 2017-01-26 NOTE — Progress Notes (Signed)
Stress Test looked good!! No sign of significant Heart Artery Disease.  Pump function is normal.  Good news!!.  [name] W, MD 

## 2017-01-28 ENCOUNTER — Telehealth: Payer: Self-pay | Admitting: Cardiology

## 2017-01-28 NOTE — Telephone Encounter (Signed)
Spoke to patient. Preliminary report given -doppler

## 2017-01-28 NOTE — Telephone Encounter (Signed)
Called patient and advised her that a PA was done but denied. Advised her an appeal was sent today, reply will be in 5 business days. Advised she should hear back from this office or [facility]. She verbalized understanding, appreciation.

## 2017-01-28 NOTE — Telephone Encounter (Signed)
Pt called in she has rec'd a phone call and then information in the mail reg the PA from [employer]. She is wanting to know the status, she said this is all new territory for her.

## 2017-01-28 NOTE — Telephone Encounter (Signed)
New message ° ° °Pt is returning call to Sharon. °

## 2017-02-01 NOTE — Telephone Encounter (Signed)
[employer] has approved the appeal for [employer]. PA is good from Sept 12,2018 until 05/10/2017. Authorization number: [id number]

## 2017-02-02 LAB — BASIC METABOLIC PANEL
BUN/Creatinine Ratio: 26 (ref 12–28)
BUN: 22 mg/dL (ref 8–27)
CHLORIDE: 97 mmol/L (ref 96–106)
CO2: 23 mmol/L (ref 20–29)
Calcium: 9.8 mg/dL (ref 8.7–10.3)
Creatinine, Ser: 0.85 mg/dL (ref 0.57–1.00)
GFR calc Af Amer: 83 mL/min/{1.73_m2} (ref >59)
GFR calc non Af Amer: 72 mL/min/{1.73_m2} (ref >59)
Glucose: 101 mg/dL — ABNORMAL HIGH (ref 65–99)
Potassium: 3.9 mmol/L (ref 3.5–5.2)
Sodium: 138 mmol/L (ref 134–144)

## 2017-02-03 ENCOUNTER — Encounter: Payer: Self-pay | Admitting: Podiatry

## 2017-02-03 ENCOUNTER — Ambulatory Visit (INDEPENDENT_AMBULATORY_CARE_PROVIDER_SITE_OTHER): Payer: Medicare Other | Admitting: Podiatry

## 2017-02-03 DIAGNOSIS — B351 Tinea unguium: Secondary | ICD-10-CM | POA: Diagnosis not present

## 2017-02-03 DIAGNOSIS — M79676 Pain in unspecified toe(s): Secondary | ICD-10-CM | POA: Diagnosis not present

## 2017-02-03 NOTE — Progress Notes (Signed)
   Subjective:    Patient ID: [name], female    DOB: June 08, 1950, 66 y.o.   MRN: [id number]  HPI this patient presents to the office with chief complaint of long, ingrowing toenails, especially big toes on both feet. .  She states that the nails are painful and ingrown causing pain when she walks and wears her shoes. She presents the office today for an evaluation and treatment of this condition    Review of Systems  Constitutional: Positive for unexpected weight change.  Cardiovascular: Positive for leg swelling.  Neurological: Positive for seizures.  Hematological: Bruises/bleeds easily.       Objective:   Physical Exam GENERAL APPEARANCE: Alert, conversant. Appropriately groomed. No acute distress.  VASCULAR: Pedal pulses are  palpable at  [facility] and PT bilateral.  Capillary refill time is immediate to all digits,  Normal temperature gradient.  Digital hair growth is present bilateral  NEUROLOGIC: sensation is normal to 5.07 monofilament at 5/5 sites bilateral.  Light touch is intact bilateral, Muscle strength normal.  MUSCULOSKELETAL: acceptable muscle strength, tone and stability bilateral.  Intrinsic muscluature intact bilateral.  Rectus appearance of foot and digits noted bilateral. Significant swelling  B/L  DERMATOLOGIC: skin color, texture, and turgor are within normal limits.  No preulcerative lesions or ulcers  are seen, no interdigital maceration noted.  No open lesions present.   No drainage noted.  NAILS  Long thick disfigured pincer toenails hallux B/L         Assessment & Plan:  Onychomycosis B/L  IE  Debride nails  RTC 6 months   Helane Gunther DPM

## 2017-02-04 ENCOUNTER — Ambulatory Visit: Payer: Medicare Other | Admitting: Cardiology

## 2017-02-04 ENCOUNTER — Ambulatory Visit: Payer: BC Managed Care – PPO | Admitting: Cardiology

## 2017-02-04 ENCOUNTER — Ambulatory Visit (INDEPENDENT_AMBULATORY_CARE_PROVIDER_SITE_OTHER): Payer: Medicare Other | Admitting: Cardiology

## 2017-02-04 ENCOUNTER — Encounter: Payer: Self-pay | Admitting: Cardiology

## 2017-02-04 VITALS — BP 102/54 | HR 82 | Ht 62.0 in | Wt 294.0 lb

## 2017-02-04 DIAGNOSIS — Z79899 Other long term (current) drug therapy: Secondary | ICD-10-CM

## 2017-02-04 DIAGNOSIS — R600 Localized edema: Secondary | ICD-10-CM | POA: Diagnosis not present

## 2017-02-04 DIAGNOSIS — R0609 Other forms of dyspnea: Secondary | ICD-10-CM

## 2017-02-04 DIAGNOSIS — I10 Essential (primary) hypertension: Secondary | ICD-10-CM

## 2017-02-04 DIAGNOSIS — R635 Abnormal weight gain: Secondary | ICD-10-CM

## 2017-02-04 MED ORDER — SPIRONOLACTONE 50 MG PO TABS: 50.0000 mg | ORAL_TABLET | Freq: Every day | ORAL | 3 refills | Status: DC

## 2017-02-04 MED ORDER — AMLODIPINE BESYLATE 5 MG PO TABS: 5.0000 mg | ORAL_TABLET | Freq: Every day | ORAL | 3 refills | Status: DC

## 2017-02-04 NOTE — Assessment & Plan Note (Addendum)
With her recent hypokalemia episodes I am leery of really pushing her diuretic any further, but still think she probably needs to double her initial dose the day at least 2-3 days a week. I will further titrate spironolactone and back off on the amlodipine to avoid any venous stasis exacerbation.  She continues to use daily metolazone with her Lasix.  Continue to do Ace wraps bilaterally. Unfortunately the zipper stockings do not go high enough to be of assistance.  Unfortunately, her Doppler did not show any evidence of a relatively reversible etiology superficial venous stasis changes. I suspect there is still some component of deep venous insufficiency. In the absence of PND or orthopnea, I do not think this is related to LV diastolic dysfunction.  Unfortunately, with her tendency for hypokalemia, we needed to close eye on her potassium levels as we adjust medications. I'll recheck labs in 2 weeks.

## 2017-02-04 NOTE — Patient Instructions (Addendum)
Medication  Increase Spirolactone 50 mg one tablet daily   Decrease amlodipine 5 mg daily   CONTINUE TO USE ACE WRAPS  Labs in 2 weeks  BMP  ( THURSDAY NOV [id number])   Your physician recommends that you schedule a follow-up appointment in Nov /Dec 2018 with DR [facility].
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
## 2017-02-04 NOTE — Assessment & Plan Note (Signed)
At this point, with negative stress test and think we need to treat most of her dyspnea to her obesity and edema. Continue to recommend increasing her exercise level as her ankle/foot heals

## 2017-02-04 NOTE — Assessment & Plan Note (Signed)
She actually is borderline hypotensive today on her combination of amlodipine, Diovan and spironolactone plus Lasix. Apparently her valsartan (is clear of the recall concern. Since her blood pressure seems to be better control like to do is back off on the amlodipine an increase spironolactone to 50 mg daily. This will help alleviate some of the side effects of edema from amlodipine and help counterbalance her hypokalemia

## 2017-02-04 NOTE — Assessment & Plan Note (Signed)
I don't think all the weight that she is up is related to edema. I think she is now more sedentary and therefore is probably put on some regular weight. Would like to set her dry weight for roughly 287 pounds.  Encouraged adjusting her by mouth intake.

## 2017-02-05 ENCOUNTER — Other Ambulatory Visit: Payer: Self-pay | Admitting: Cardiology

## 2017-02-08 ENCOUNTER — Telehealth: Payer: Self-pay | Admitting: Cardiology

## 2017-02-08 MED ORDER — AMLODIPINE BESYLATE 5 MG PO TABS: 5.0000 mg | ORAL_TABLET | Freq: Every day | ORAL | 3 refills | Status: DC

## 2017-02-08 NOTE — Telephone Encounter (Signed)
Returned call to patient.She requested 90 day refill for Amlodipine 5 mg.Refill sent to pharmacy.

## 2017-02-08 NOTE — Telephone Encounter (Signed)
New Message     Pt c/o medication issue:  1. Name of Medication: amlodipine   2. How are you currently taking this medication (dosage and times per day)?  1  tablet a day  3. Are you having a reaction (difficulty breathing--STAT)? no  4. What is your medication issue?  She needs a prescription called into [facility] [address] because she can not cut the  in half

## 2017-02-16 ENCOUNTER — Telehealth: Payer: Self-pay | Admitting: *Deleted

## 2017-02-16 DIAGNOSIS — Z79899 Other long term (current) drug therapy: Secondary | ICD-10-CM

## 2017-02-16 DIAGNOSIS — E876 Hypokalemia: Secondary | ICD-10-CM

## 2017-02-16 LAB — BASIC METABOLIC PANEL
BUN / CREAT RATIO: 27 (ref 12–28)
BUN: 29 mg/dL — AB (ref 8–27)
CALCIUM: 9.5 mg/dL (ref 8.7–10.3)
CHLORIDE: 94 mmol/L — AB (ref 96–106)
CO2: 24 mmol/L (ref 20–29)
Creatinine, Ser: 1.07 mg/dL — ABNORMAL HIGH (ref 0.57–1.00)
GFR calc Af Amer: 63 mL/min/{1.73_m2} (ref >59)
GFR calc non Af Amer: 55 mL/min/{1.73_m2} — ABNORMAL LOW (ref >59)
GLUCOSE: 97 mg/dL (ref 65–99)
POTASSIUM: 3.1 mmol/L — AB (ref 3.5–5.2)
Sodium: 138 mmol/L (ref 134–144)

## 2017-02-16 NOTE — Telephone Encounter (Signed)
Called patient since her potassium was 3.1 and advised to take an extra 20 meq potassium today and would call after Dr Herbie Baltimore reviewed. Patient stated she is taking same about of Spironolactone, Furosemide, and Metolazone as always but urinary output decreased. She does weigh herself daily with no weight gain, no change in breathing or swelling. Advised to continue same dose of medications and will make Dr Herbie Baltimore aware.

## 2017-02-16 NOTE — Telephone Encounter (Signed)
Labs reviewed - instructions noted on result note.Bryan Lemma, MD

## 2017-02-17 DIAGNOSIS — E876 Hypokalemia: Secondary | ICD-10-CM | POA: Insufficient documentation

## 2017-02-17 NOTE — Telephone Encounter (Signed)
-----   Message from Marykay Lex, MD sent at 02/16/2017 11:06 PM EDT ----- Take additional Potassium (2 additional tabs) x 2 days then increase maintenance dose by 1 tab.  Kidney function looks OK. (we are starting to look a bit dry), but OK.  Recheck BMP Friday.  Bryan Lemma, MD

## 2017-02-17 NOTE — Telephone Encounter (Signed)
Spoke to patient. She verbalized understanding.  she will take an extra 2 tabs today since stated she took 20 meq  Yesterday. Aware to add one dose 10 meq thereafter daily.  Patient routine medication  20 meq  three times a day. She will change to 30 meq  In the morning , and 20 meq at lunch and dinner.  Patient also states sh gained three pounds over night- per protocol instructed patient to take an extra 40 mg furosemide today.  labs on Friday - BMP - PATIENT STATES IT MAYBE UNTIL Tuesday BEFORE SHE ABLE TO GET LABWORK.

## 2017-02-19 ENCOUNTER — Ambulatory Visit: Payer: Medicare Other | Admitting: Cardiology

## 2017-02-25 LAB — BASIC METABOLIC PANEL
BUN / CREAT RATIO: 21 (ref 12–28)
BUN: 20 mg/dL (ref 8–27)
CO2: 24 mmol/L (ref 20–29)
CREATININE: 0.97 mg/dL (ref 0.57–1.00)
Calcium: 9.3 mg/dL (ref 8.7–10.3)
Chloride: 93 mmol/L — ABNORMAL LOW (ref 96–106)
GFR calc Af Amer: 71 mL/min/{1.73_m2} (ref >59)
GFR, EST NON AFRICAN AMERICAN: 61 mL/min/{1.73_m2} (ref >59)
Glucose: 92 mg/dL (ref 65–99)
Potassium: 3.1 mmol/L — ABNORMAL LOW (ref 3.5–5.2)
SODIUM: 136 mmol/L (ref 134–144)

## 2017-03-03 ENCOUNTER — Other Ambulatory Visit: Payer: Self-pay

## 2017-03-03 MED ORDER — POTASSIUM CHLORIDE ER 10 MEQ PO TBCR: EXTENDED_RELEASE_TABLET | ORAL | 3 refills | Status: DC

## 2017-03-03 NOTE — Telephone Encounter (Signed)
Pharmacy needs new rx for kdur, three times a day [facility] [address]. Please advise

## 2017-03-06 ENCOUNTER — Other Ambulatory Visit: Payer: Self-pay | Admitting: Adult Health

## 2017-03-08 ENCOUNTER — Other Ambulatory Visit: Payer: Self-pay | Admitting: *Deleted

## 2017-03-10 NOTE — Telephone Encounter (Signed)
Late entry: Faxed printed/signed rx phenobarbital to pt pharmacy yesterday at [phone]. Received fax confirmation.

## 2017-03-11 ENCOUNTER — Encounter: Payer: Self-pay | Admitting: Adult Health

## 2017-03-11 ENCOUNTER — Ambulatory Visit (INDEPENDENT_AMBULATORY_CARE_PROVIDER_SITE_OTHER): Payer: Medicare Other | Admitting: Adult Health

## 2017-03-11 VITALS — BP 123/69 | HR 96 | Wt 294.0 lb

## 2017-03-11 DIAGNOSIS — G4733 Obstructive sleep apnea (adult) (pediatric): Secondary | ICD-10-CM | POA: Diagnosis not present

## 2017-03-11 DIAGNOSIS — Z9989 Dependence on other enabling machines and devices: Secondary | ICD-10-CM | POA: Diagnosis not present

## 2017-03-11 DIAGNOSIS — Z5181 Encounter for therapeutic drug level monitoring: Secondary | ICD-10-CM

## 2017-03-11 DIAGNOSIS — G2581 Restless legs syndrome: Secondary | ICD-10-CM | POA: Diagnosis not present

## 2017-03-11 DIAGNOSIS — R569 Unspecified convulsions: Secondary | ICD-10-CM

## 2017-03-11 MED ORDER — PRAMIPEXOLE DIHYDROCHLORIDE ER 0.75 MG PO TB24: 1.0000 | ORAL_TABLET | Freq: Every day | ORAL | 2 refills | Status: DC

## 2017-03-11 MED ORDER — GABAPENTIN ENACARBIL ER 600 MG PO TBCR
EXTENDED_RELEASE_TABLET | ORAL | 1 refills | Status: DC
Start: 2017-03-11 — End: 2017-07-12

## 2017-03-11 NOTE — Patient Instructions (Signed)
Your Plan:  Continue mirapex and horizant for RLS Continue Phenobarbital for seizures  If your symptoms worsen or you develop new symptoms please let us know.   Thank you for coming to see [facility] at [facility]. I hope we have been able to provide you high quality care today.  You may receive a patient satisfaction survey over the next few weeks. We would appreciate your feedback and comments so that we may continue to improve ourselves and the health of our patients.
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
## 2017-03-12 LAB — CBC WITH DIFFERENTIAL/PLATELET
Basophils Absolute: 0 10*3/uL (ref 0.0–0.2)
Basos: 0 %
EOS (ABSOLUTE): 0 10*3/uL (ref 0.0–0.4)
EOS: 0 %
HEMATOCRIT: 35.7 % (ref 34.0–46.6)
Hemoglobin: 12.6 g/dL (ref 11.1–15.9)
IMMATURE GRANULOCYTES: 0 %
Immature Grans (Abs): 0 10*3/uL (ref 0.0–0.1)
LYMPHS ABS: 1.2 10*3/uL (ref 0.7–3.1)
Lymphs: 25 %
MCH: 31.1 pg (ref 26.6–33.0)
MCHC: 35.3 g/dL (ref 31.5–35.7)
MCV: 88 fL (ref 79–97)
MONOS ABS: 0.1 10*3/uL (ref 0.1–0.9)
Monocytes: 1 %
NEUTROS PCT: 74 %
Neutrophils Absolute: 3.6 10*3/uL (ref 1.4–7.0)
PLATELETS: 184 10*3/uL (ref 150–379)
RBC: 4.05 x10E6/uL (ref 3.77–5.28)
RDW: 13.7 % (ref 12.3–15.4)
WBC: 4.9 10*3/uL (ref 3.4–10.8)

## 2017-03-12 LAB — COMPREHENSIVE METABOLIC PANEL
ALBUMIN: 4.6 g/dL (ref 3.6–4.8)
ALT: 46 IU/L — ABNORMAL HIGH (ref 0–32)
AST: 46 IU/L — ABNORMAL HIGH (ref 0–40)
Albumin/Globulin Ratio: 1.6 (ref 1.2–2.2)
Alkaline Phosphatase: 112 IU/L (ref 39–117)
BUN / CREAT RATIO: 25 (ref 12–28)
BUN: 25 mg/dL (ref 8–27)
Bilirubin Total: 0.3 mg/dL (ref 0.0–1.2)
CALCIUM: 9.7 mg/dL (ref 8.7–10.3)
CO2: 25 mmol/L (ref 20–29)
CREATININE: 1 mg/dL (ref 0.57–1.00)
Chloride: 94 mmol/L — ABNORMAL LOW (ref 96–106)
GFR calc Af Amer: 68 mL/min/{1.73_m2} (ref >59)
GFR, EST NON AFRICAN AMERICAN: 59 mL/min/{1.73_m2} — AB (ref >59)
GLUCOSE: 157 mg/dL — AB (ref 65–99)
Globulin, Total: 2.8 g/dL (ref 1.5–4.5)
POTASSIUM: 3.6 mmol/L (ref 3.5–5.2)
SODIUM: 139 mmol/L (ref 134–144)
TOTAL PROTEIN: 7.4 g/dL (ref 6.0–8.5)

## 2017-03-12 LAB — PHENOBARBITAL LEVEL: Phenobarbital, Serum: 26 ug/mL (ref 15–40)

## 2017-03-12 NOTE — Progress Notes (Signed)
I agree with the assessment and plan as directed by NP .The patient is known to me . Alternate visits between NP and MD.   Melvyn NovasHMEIER,Delicia Berens, MD

## 2017-03-15 ENCOUNTER — Telehealth: Payer: Self-pay | Admitting: *Deleted

## 2017-03-15 NOTE — Telephone Encounter (Signed)
Spoke with patient and informed her that her lab work is relatively unremarkable. Did report that her liver enzymes, AST and ALT are slightly elevated. [name] will recheck in approximately 1-2 months. Patient has labs very regularly by Dr Herbie BaltimoreHarding; she has appointment with him the end of Nov. She requested this note be sent to him to include AST, ALT in her next lab draw in his office if possible.  She verbalized understanding, appreciation of call.

## 2017-03-19 ENCOUNTER — Telehealth: Payer: Self-pay | Admitting: *Deleted

## 2017-03-19 MED ORDER — POTASSIUM CHLORIDE ER 10 MEQ PO TBCR: 40.0000 meq | EXTENDED_RELEASE_TABLET | Freq: Two times a day (BID) | ORAL | 3 refills | Status: DC

## 2017-03-19 NOTE — Telephone Encounter (Signed)
-----   Message from Marykay Lexavid W Harding, MD sent at 03/16/2017  8:43 PM EST ----- The last few potassium levels have been quite low.  Would recommend taking a total of 40 mEq of potassium daily.  Can recheck @ f/u appt.  Bryan Lemmaavid Harding

## 2017-03-19 NOTE — Telephone Encounter (Signed)
REVIEWED WITH DR Herbie BaltimoreHARDING. patient is taking  20 meq three times a day per medication lisit , per v/o increase potassium to 40 meq twice a day  patient aware.  Will do CMP at next appointment [facility]  E-SENT NEW PRESCRIPTION TO BE PLACED ON FILE PER RE-QUEST. patient verbalized understanding

## 2017-03-22 ENCOUNTER — Other Ambulatory Visit: Payer: Self-pay | Admitting: *Deleted

## 2017-03-22 MED ORDER — SPIRONOLACTONE 50 MG PO TABS: 50.0000 mg | ORAL_TABLET | Freq: Every day | ORAL | 3 refills | Status: DC

## 2017-03-22 MED ORDER — VALSARTAN 160 MG PO TABS: 160.0000 mg | ORAL_TABLET | Freq: Every day | ORAL | 3 refills | Status: DC

## 2017-03-22 MED ORDER — METOLAZONE 5 MG PO TABS: 5.0000 mg | ORAL_TABLET | Freq: Every day | ORAL | 3 refills | Status: DC

## 2017-03-23 ENCOUNTER — Other Ambulatory Visit: Payer: Self-pay | Admitting: *Deleted

## 2017-03-23 MED ORDER — METOLAZONE 5 MG PO TABS: 5.0000 mg | ORAL_TABLET | Freq: Every day | ORAL | 3 refills | Status: DC

## 2017-04-08 ENCOUNTER — Encounter: Payer: Self-pay | Admitting: Cardiology

## 2017-04-08 ENCOUNTER — Ambulatory Visit: Payer: Medicare Other | Admitting: Cardiology

## 2017-04-08 VITALS — BP 120/62 | HR 70 | Ht 62.0 in | Wt 297.0 lb

## 2017-04-08 DIAGNOSIS — R600 Localized edema: Secondary | ICD-10-CM

## 2017-04-08 DIAGNOSIS — I890 Lymphedema, not elsewhere classified: Secondary | ICD-10-CM | POA: Diagnosis not present

## 2017-04-08 DIAGNOSIS — R55 Syncope and collapse: Secondary | ICD-10-CM | POA: Diagnosis not present

## 2017-04-08 DIAGNOSIS — G4733 Obstructive sleep apnea (adult) (pediatric): Secondary | ICD-10-CM

## 2017-04-08 DIAGNOSIS — I10 Essential (primary) hypertension: Secondary | ICD-10-CM | POA: Diagnosis not present

## 2017-04-08 DIAGNOSIS — E782 Mixed hyperlipidemia: Secondary | ICD-10-CM | POA: Diagnosis not present

## 2017-04-08 DIAGNOSIS — Z9989 Dependence on other enabling machines and devices: Secondary | ICD-10-CM | POA: Diagnosis not present

## 2017-04-08 DIAGNOSIS — G4730 Sleep apnea, unspecified: Secondary | ICD-10-CM | POA: Diagnosis not present
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
## 2017-04-08 NOTE — Patient Instructions (Signed)
NO CHANGE WITH CURRENT MEDICATIONS  CONTINUE WITH POTASSIUM AND DIURETICS SLIDING SCALE.   CONTINUE TO WEAR ACE WRAPS TO LEGS.        OFFICE WILL CONTACT YOU IN REGARDS TO A REFERRAL TO A LYMPHOEDEMA  CLINICAL.    Your physician recommends that you schedule a follow-up appointment in 3- 4 MONTHS WITH DR HARDING.

## 2017-04-10 ENCOUNTER — Encounter: Payer: Self-pay | Admitting: Cardiology

## 2017-04-10 NOTE — Assessment & Plan Note (Signed)
Blood pressure relatively well controlled on amlodipine, valsartan and spironolactone along with diuresis using Lasix and metolazone.

## 2017-04-10 NOTE — Assessment & Plan Note (Signed)
Continue rehab 

## 2017-04-10 NOTE — Assessment & Plan Note (Signed)
On Crestor.  Managed by PCP.

## 2017-04-10 NOTE — Assessment & Plan Note (Signed)
Very difficult to control even with diuretic and Ace wraps.  Unilateral swelling.  Again I think this may be more related to lymphedema.  We will try to have her referred to a lymphedema clinic.  Probably want to consider using the Unna boot wraps.

## 2017-04-10 NOTE — Assessment & Plan Note (Signed)
No recurrent episodes. 

## 2017-04-10 NOTE — Assessment & Plan Note (Signed)
Notably right greater than left edema now.  I am concerned this is probably more lymphedema based on the fact that there is no signs of venous insufficiency, and unilateral swelling is probably not heart failure related especially in the setting of no PND/orthopnea.  For now continue diuresis.  Also continue Ace wraps. Will refer to lymphedema clinic

## 2017-04-10 NOTE — Assessment & Plan Note (Signed)
She may have some component of OHS - Continue CPAP

## 2017-04-13 ENCOUNTER — Other Ambulatory Visit: Payer: Self-pay | Admitting: *Deleted

## 2017-04-13 MED ORDER — FUROSEMIDE 40 MG PO TABS: 80.0000 mg | ORAL_TABLET | Freq: Every day | ORAL | 3 refills | Status: DC

## 2017-04-14 ENCOUNTER — Telehealth: Payer: Self-pay | Admitting: *Deleted
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
## 2017-04-15 NOTE — Telephone Encounter (Signed)
Left message for pt to call.
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
## 2017-05-03 ENCOUNTER — Other Ambulatory Visit: Payer: Self-pay | Admitting: *Deleted

## 2017-05-03 MED ORDER — ROSUVASTATIN CALCIUM 20 MG PO TABS: 20.0000 mg | ORAL_TABLET | Freq: Every day | ORAL | 1 refills | Status: DC

## 2017-05-19 ENCOUNTER — Telehealth: Payer: Self-pay | Admitting: *Deleted

## 2017-05-19 DIAGNOSIS — G2581 Restless legs syndrome: Secondary | ICD-10-CM

## 2017-05-19 NOTE — Telephone Encounter (Signed)
Horizant PA initiated on CMM. [facility] should give reply within 72 hours.

## 2017-05-20 ENCOUNTER — Telehealth: Payer: Self-pay | Admitting: Adult Health

## 2017-05-20 DIAGNOSIS — R748 Abnormal levels of other serum enzymes: Secondary | ICD-10-CM

## 2017-05-20 NOTE — Telephone Encounter (Signed)
Called patient to advise she needs to come in for repeat lab, order is in. [facility] She verbalized understanding of call.

## 2017-05-20 NOTE — Telephone Encounter (Signed)
Liver enzymes was elevated at the last office visit.  We need to recheck lab work.  Order placed.  Please call patient.

## 2017-05-20 NOTE — Telephone Encounter (Signed)
Spoke with patient who stated she does not remember trying Ropinirole for RLS. She stated she is willing to try it for 3-4 weeks if that is what is required to get Horizant approved.  She stated she normally takes Pramipexole 0.25 mg at dinner time, then takes Pramipexole 0.75 mg with Horizant at bedtime. She stated her RLS is well controlled. She wanted to know if she can continue that schedule of Pramipexole with taking Ropinirole. This RN stated will let Aundra MilletMegan know to Rx [facility] to [facility] [address] and to address her question of taking it at bedtime with Pramipexole. This RN advised she will get a call back only if NP has further questions.  Patient verbalized understanding, appreciation.

## 2017-05-20 NOTE — Telephone Encounter (Signed)
LVM requesting call back to discuss tried and failed medications for RLS.

## 2017-05-21 ENCOUNTER — Encounter: Payer: Self-pay | Admitting: *Deleted

## 2017-05-21 NOTE — Telephone Encounter (Signed)
Can we try to get this approved without switching to requip? What she is on now is working. Perhaps appeal letter to better explain?

## 2017-05-21 NOTE — Telephone Encounter (Signed)
Received call from patient stating her Ropinirole Rx has not been sent in. This RN advised her that [name] wants this RN to appeal the denial for Horizant before Rxing Ropinirole. Patient stated she has been out of Horizant x 2 days. She then stated that at times she takes Pramipexole 0.25 mg tabs, two at suppertime because 1 tab is not enough. She is asking for approval of NP.  Will discuss with NP and call her back today. Patient verbalized understanding, appreciation.

## 2017-05-21 NOTE — Telephone Encounter (Addendum)
Called [employer] appeals dept to initiate appeal pa for horizant. Spoke with Liborio NixonJanice, received information, instructions. Letter composed, on Megan's desk for review , signature.

## 2017-05-24 ENCOUNTER — Other Ambulatory Visit (INDEPENDENT_AMBULATORY_CARE_PROVIDER_SITE_OTHER): Payer: Self-pay

## 2017-05-24 DIAGNOSIS — R748 Abnormal levels of other serum enzymes: Secondary | ICD-10-CM

## 2017-05-24 DIAGNOSIS — Z0289 Encounter for other administrative examinations: Secondary | ICD-10-CM

## 2017-05-24 NOTE — Telephone Encounter (Signed)
Appeal letter and documents successfully faxed to [facility]. Turn around for urgent appeal is 72 hours.

## 2017-05-25 LAB — COMPREHENSIVE METABOLIC PANEL
ALK PHOS: 109 IU/L (ref 39–117)
ALT: 50 IU/L — ABNORMAL HIGH (ref 0–32)
AST: 50 IU/L — ABNORMAL HIGH (ref 0–40)
Albumin/Globulin Ratio: 1.4 (ref 1.2–2.2)
Albumin: 4.2 g/dL (ref 3.6–4.8)
BILIRUBIN TOTAL: 0.2 mg/dL (ref 0.0–1.2)
BUN/Creatinine Ratio: 26 (ref 12–28)
BUN: 18 mg/dL (ref 8–27)
CO2: 25 mmol/L (ref 20–29)
CREATININE: 0.68 mg/dL (ref 0.57–1.00)
Calcium: 9.7 mg/dL (ref 8.7–10.3)
Chloride: 104 mmol/L (ref 96–106)
GFR calc non Af Amer: 92 mL/min/{1.73_m2} (ref >59)
GFR, EST AFRICAN AMERICAN: 106 mL/min/{1.73_m2} (ref >59)
GLOBULIN, TOTAL: 2.9 g/dL (ref 1.5–4.5)
Glucose: 90 mg/dL (ref 65–99)
Potassium: 4.4 mmol/L (ref 3.5–5.2)
SODIUM: 141 mmol/L (ref 134–144)
TOTAL PROTEIN: 7.1 g/dL (ref 6.0–8.5)

## 2017-05-25 MED ORDER — PRAMIPEXOLE DIHYDROCHLORIDE 0.25 MG PO TABS: 0.2500 mg | ORAL_TABLET | ORAL | 3 refills | Status: DC

## 2017-05-25 NOTE — Telephone Encounter (Signed)
LVM informing patient that Rx for Mirapex was sent in.

## 2017-05-25 NOTE — Telephone Encounter (Signed)
Shelley PihJackie with [employer] says Shelley DuHorizant has been approved from 05-19-17 through 05-10-18 Auth#[id number]. A returned call is not needed unless there are questions.

## 2017-05-25 NOTE — Telephone Encounter (Signed)
Patient calling to advise she [facility] will not [facility] for Gabapentin Enacarbil (HORIZANT) 600 MG TBCR. Can another drug be called in for RLS and can she increase dosage ofpramipexole (MIRAPEX) 0.25 MG tablet. Please call and advise. [facility] [name]

## 2017-05-25 NOTE — Telephone Encounter (Addendum)
Spoke with patient and advised her of horizant appeal sent yesterday. Advised her the turn around for decision can be 72 hours. She then asked if her question of taking extra Mirapex 0.25 mg was addressed. Patient is taking Mirapex 0.25 mg at suppertime and at times she repeats this dose if her RLS are not controlled. She then waits as long as possible before bedtime to take the 0.75 mg dose to get her through the night. This RN stated will discuss with NP and call her back.  She is asking if this is okay and if so, she may need a new Rx. This RN will route to NP for reply.

## 2017-05-25 NOTE — Addendum Note (Signed)
Addended by: Enedina FinnerMILLIKAN, Tatem Fesler P on: 05/25/2017 04:35 PM   Modules accepted: Orders

## 2017-05-25 NOTE — Telephone Encounter (Signed)
Called patient and advised her of horizant approved. She stated she had received phone call about the same. This RN gave her app # to take to pharmacy. Patient then asked about Mirapex dose, stated she only has a few tabs left. She last filled Rx on 04/08/17, received 90 tabs. This RN advised will call her after discussing with NP. Patient verbalized understanding, appreciation.

## 2017-05-27 ENCOUNTER — Telehealth: Payer: Self-pay | Admitting: *Deleted

## 2017-05-27 NOTE — Telephone Encounter (Signed)
Spoke with patient and informed her labs showed AST and ALT slightly elevated. [name] will recheck at the next office visit.  Patient asked if she could do anything to lower liver enzymes; this RN advised not that she is aware of. However NP will check again to be sure the results are improving. Patient verbalized understanding, appreciation of call.

## 2017-07-12 ENCOUNTER — Ambulatory Visit: Payer: Medicare Other | Admitting: Cardiology

## 2017-07-12 ENCOUNTER — Encounter: Payer: Self-pay | Admitting: Cardiology

## 2017-07-12 VITALS — BP 141/67 | HR 61 | Ht 62.0 in | Wt 289.8 lb

## 2017-07-12 DIAGNOSIS — R0609 Other forms of dyspnea: Secondary | ICD-10-CM | POA: Diagnosis not present

## 2017-07-12 DIAGNOSIS — R600 Localized edema: Secondary | ICD-10-CM

## 2017-07-12 DIAGNOSIS — I890 Lymphedema, not elsewhere classified: Secondary | ICD-10-CM | POA: Diagnosis not present

## 2017-07-12 DIAGNOSIS — E782 Mixed hyperlipidemia: Secondary | ICD-10-CM | POA: Diagnosis not present

## 2017-07-12 DIAGNOSIS — I10 Essential (primary) hypertension: Secondary | ICD-10-CM

## 2017-07-12 DIAGNOSIS — E876 Hypokalemia: Secondary | ICD-10-CM | POA: Diagnosis not present

## 2017-07-12 LAB — COMPREHENSIVE METABOLIC PANEL
ALT: 34 IU/L — ABNORMAL HIGH (ref 0–32)
AST: 34 IU/L (ref 0–40)
Albumin/Globulin Ratio: 1.8 (ref 1.2–2.2)
Albumin: 4.3 g/dL (ref 3.6–4.8)
Alkaline Phosphatase: 107 IU/L (ref 39–117)
BUN/Creatinine Ratio: 19 (ref 12–28)
BUN: 14 mg/dL (ref 8–27)
Bilirubin Total: 0.2 mg/dL (ref 0.0–1.2)
CALCIUM: 9.5 mg/dL (ref 8.7–10.3)
CO2: 20 mmol/L (ref 20–29)
CREATININE: 0.72 mg/dL (ref 0.57–1.00)
Chloride: 107 mmol/L — ABNORMAL HIGH (ref 96–106)
GFR, EST AFRICAN AMERICAN: 102 mL/min/{1.73_m2} (ref >59)
GFR, EST NON AFRICAN AMERICAN: 88 mL/min/{1.73_m2} (ref >59)
Globulin, Total: 2.4 g/dL (ref 1.5–4.5)
Glucose: 88 mg/dL (ref 65–99)
Potassium: 4.5 mmol/L (ref 3.5–5.2)
SODIUM: 143 mmol/L (ref 134–144)
TOTAL PROTEIN: 6.7 g/dL (ref 6.0–8.5)

## 2017-07-12 LAB — LIPID PANEL
Chol/HDL Ratio: 3.6 ratio (ref 0.0–4.4)
Cholesterol, Total: 148 mg/dL (ref 100–199)
HDL: 41 mg/dL (ref >39)
LDL CALC: 75 mg/dL (ref 0–99)
TRIGLYCERIDES: 162 mg/dL — AB (ref 0–149)
VLDL Cholesterol Cal: 32 mg/dL (ref 5–40)

## 2017-07-12 NOTE — Patient Instructions (Signed)
MEDICATION INSTRUCTIONS  NO CHANGES      LABS-- DO NOT EAT OR DRINK THE MORNING OF THE TESTS LIPID CMP   [facility] [place]     Your physician wants you to follow-up in 6 MONTHS WITH DR HARDING. You will receive a reminder letter in the mail two months in advance. If you don't receive a letter, please call our office to schedule the follow-up appointment.   If you need a refill on your cardiac medications before your next appointment, please call your pharmacy.
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
## 2017-07-14 ENCOUNTER — Encounter: Payer: Self-pay | Admitting: Cardiology

## 2017-07-14 NOTE — Assessment & Plan Note (Signed)
Quite significantly right greater than left edema.  The left leg is pretty much controlled with current diuretic dosing and wraps.  The right leg has improved, but is still tense and swollen.  I do think she would benefit from a lymphedema clinic because there is not been any significant ultrasound evidence of venous insufficiency and the swelling is unilateral.  Probably related to multiple traumas or injuries. Unfortunately now she will probably need recurrent surgery for which we will have to reassess based on level of swelling but for now would be stable.

## 2017-07-14 NOTE — Assessment & Plan Note (Signed)
On Crestor.  Managed by PCP.  Had lipids checked today pretty much at goal for her risk factors. Continue current dose of Crestor.  No myalgias.

## 2017-07-14 NOTE — Assessment & Plan Note (Signed)
Continue Ace wraps, but will refer to lymphedema clinic for additional assistance.

## 2017-07-14 NOTE — Assessment & Plan Note (Signed)
Actually stable.  No changes to her current regimen for significant edema and what amounts to being minor part hypertensive heart disease.

## 2017-07-14 NOTE — Assessment & Plan Note (Addendum)
We will recheck chemistry panel today (will be noted as part of clinic note).. Talked about the importance of maintaining potassium levels with all her diuretic.  She is on spironolactone and ARB along with Lasix and Zaroxolyn.  Needs to stay adequately monitored every so often with chemistry panel checked.  We are checking one today as part of reassessment.

## 2017-07-14 NOTE — Assessment & Plan Note (Signed)
Blood pressure has not really been a big issue for her on current medications.  I really would like to not do as much amlodipine, but her blood pressure looks great so I am reluctant to change.  She is on 5 mg amlodipine along with spironolactone, valsartan 10 and standing dose of Lasix.    Watch out for signs of dehydration with multiple diuretics.

## 2017-07-15 ENCOUNTER — Encounter: Payer: Self-pay | Admitting: Occupational Therapy

## 2017-07-15 ENCOUNTER — Ambulatory Visit: Payer: Medicare Other | Attending: Cardiology | Admitting: Occupational Therapy

## 2017-07-15 DIAGNOSIS — I890 Lymphedema, not elsewhere classified: Secondary | ICD-10-CM

## 2017-07-15 NOTE — Patient Instructions (Signed)

## 2017-07-19 ENCOUNTER — Ambulatory Visit: Payer: Medicare Other | Admitting: Occupational Therapy

## 2017-07-19 DIAGNOSIS — I890 Lymphedema, not elsewhere classified: Secondary | ICD-10-CM
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
## 2017-07-20 ENCOUNTER — Ambulatory Visit: Payer: Medicare Other | Admitting: Occupational Therapy

## 2017-07-20 DIAGNOSIS — I890 Lymphedema, not elsewhere classified: Secondary | ICD-10-CM | POA: Diagnosis not present
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
## 2017-07-22 ENCOUNTER — Ambulatory Visit: Payer: Medicare Other | Admitting: Occupational Therapy

## 2017-07-22 DIAGNOSIS — I890 Lymphedema, not elsewhere classified: Secondary | ICD-10-CM | POA: Diagnosis not present
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
## 2017-07-26 ENCOUNTER — Ambulatory Visit: Payer: Medicare Other | Admitting: Occupational Therapy

## 2017-07-26 DIAGNOSIS — I890 Lymphedema, not elsewhere classified: Secondary | ICD-10-CM | POA: Diagnosis not present
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
## 2017-07-27 ENCOUNTER — Ambulatory Visit: Payer: Medicare Other | Admitting: Occupational Therapy

## 2017-07-27 DIAGNOSIS — I890 Lymphedema, not elsewhere classified: Secondary | ICD-10-CM | POA: Diagnosis not present
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
## 2017-07-28 ENCOUNTER — Ambulatory Visit: Payer: Medicare Other | Admitting: Occupational Therapy

## 2017-07-29 ENCOUNTER — Ambulatory Visit: Payer: Medicare Other | Admitting: Occupational Therapy

## 2017-07-29 DIAGNOSIS — I890 Lymphedema, not elsewhere classified: Secondary | ICD-10-CM | POA: Diagnosis not present
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
## 2017-08-02 ENCOUNTER — Ambulatory Visit: Payer: Medicare Other | Admitting: Occupational Therapy

## 2017-08-03 ENCOUNTER — Ambulatory Visit: Payer: Medicare Other | Admitting: Occupational Therapy

## 2017-08-03 DIAGNOSIS — I890 Lymphedema, not elsewhere classified: Secondary | ICD-10-CM

## 2017-08-04 ENCOUNTER — Encounter: Payer: Self-pay | Admitting: Occupational Therapy

## 2017-08-04 ENCOUNTER — Ambulatory Visit: Payer: Medicare Other | Admitting: Podiatry
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
## 2017-08-05 ENCOUNTER — Ambulatory Visit: Payer: Medicare Other | Admitting: Occupational Therapy

## 2017-08-05 DIAGNOSIS — I890 Lymphedema, not elsewhere classified: Secondary | ICD-10-CM | POA: Diagnosis not present
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
## 2017-08-09 ENCOUNTER — Ambulatory Visit: Payer: Medicare Other | Admitting: Occupational Therapy

## 2017-08-10 ENCOUNTER — Ambulatory Visit: Payer: Medicare Other | Attending: Cardiology | Admitting: Occupational Therapy

## 2017-08-10 DIAGNOSIS — I890 Lymphedema, not elsewhere classified: Secondary | ICD-10-CM
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
## 2017-08-12 ENCOUNTER — Ambulatory Visit: Payer: Medicare Other | Admitting: Occupational Therapy

## 2017-08-12 DIAGNOSIS — I890 Lymphedema, not elsewhere classified: Secondary | ICD-10-CM
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
## 2017-08-16 ENCOUNTER — Ambulatory Visit: Payer: Medicare Other | Admitting: Occupational Therapy

## 2017-08-17 ENCOUNTER — Ambulatory Visit: Payer: Medicare Other | Admitting: Occupational Therapy

## 2017-08-17 DIAGNOSIS — I890 Lymphedema, not elsewhere classified: Secondary | ICD-10-CM | POA: Diagnosis not present

## 2017-08-18 ENCOUNTER — Ambulatory Visit: Payer: Medicare Other | Admitting: Occupational Therapy

## 2017-08-18 DIAGNOSIS — I890 Lymphedema, not elsewhere classified: Secondary | ICD-10-CM | POA: Diagnosis not present
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
## 2017-08-19 ENCOUNTER — Ambulatory Visit: Payer: Medicare Other | Admitting: Occupational Therapy

## 2017-08-19 DIAGNOSIS — I890 Lymphedema, not elsewhere classified: Secondary | ICD-10-CM | POA: Diagnosis not present
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
## 2017-08-23 ENCOUNTER — Ambulatory Visit: Payer: Medicare Other | Admitting: Occupational Therapy

## 2017-08-23 DIAGNOSIS — I890 Lymphedema, not elsewhere classified: Secondary | ICD-10-CM | POA: Diagnosis not present
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
## 2017-08-25 ENCOUNTER — Ambulatory Visit: Payer: Medicare Other | Admitting: Occupational Therapy

## 2017-08-25 DIAGNOSIS — I890 Lymphedema, not elsewhere classified: Secondary | ICD-10-CM
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
## 2017-08-26 ENCOUNTER — Ambulatory Visit: Payer: Medicare Other | Admitting: Occupational Therapy

## 2017-08-26 DIAGNOSIS — I890 Lymphedema, not elsewhere classified: Secondary | ICD-10-CM
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
## 2017-08-30 ENCOUNTER — Ambulatory Visit: Payer: Medicare Other | Admitting: Occupational Therapy

## 2017-08-30 DIAGNOSIS — I890 Lymphedema, not elsewhere classified: Secondary | ICD-10-CM
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
## 2017-09-01 ENCOUNTER — Ambulatory Visit: Payer: Medicare Other | Admitting: Occupational Therapy

## 2017-09-01 DIAGNOSIS — I890 Lymphedema, not elsewhere classified: Secondary | ICD-10-CM | POA: Diagnosis not present
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
## 2017-09-02 ENCOUNTER — Ambulatory Visit: Payer: Medicare Other | Admitting: Occupational Therapy

## 2017-09-02 DIAGNOSIS — I890 Lymphedema, not elsewhere classified: Secondary | ICD-10-CM | POA: Diagnosis not present
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
## 2017-09-06 ENCOUNTER — Ambulatory Visit: Payer: Medicare Other | Admitting: Occupational Therapy

## 2017-09-06 DIAGNOSIS — I890 Lymphedema, not elsewhere classified: Secondary | ICD-10-CM

## 2017-09-07 ENCOUNTER — Ambulatory Visit: Payer: Medicare Other | Admitting: Occupational Therapy

## 2017-09-07 DIAGNOSIS — I890 Lymphedema, not elsewhere classified: Secondary | ICD-10-CM | POA: Diagnosis not present
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
## 2017-09-08 ENCOUNTER — Ambulatory Visit: Payer: Medicare Other | Admitting: Adult Health

## 2017-09-09 ENCOUNTER — Ambulatory Visit: Payer: Medicare Other | Attending: Cardiology | Admitting: Occupational Therapy

## 2017-09-09 DIAGNOSIS — I890 Lymphedema, not elsewhere classified: Secondary | ICD-10-CM
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
## 2017-09-10 ENCOUNTER — Other Ambulatory Visit: Payer: Self-pay | Admitting: Adult Health

## 2017-09-10 NOTE — Telephone Encounter (Signed)
MM/NP out.

## 2017-09-10 NOTE — Telephone Encounter (Signed)
Fax confirmation received [phone]. sy

## 2017-09-13 ENCOUNTER — Ambulatory Visit: Payer: Medicare Other | Admitting: Occupational Therapy

## 2017-09-15 ENCOUNTER — Ambulatory Visit: Payer: Medicare Other | Admitting: Occupational Therapy

## 2017-09-16 ENCOUNTER — Ambulatory Visit: Payer: Medicare Other | Admitting: Occupational Therapy

## 2017-09-18 ENCOUNTER — Other Ambulatory Visit: Payer: Self-pay | Admitting: Cardiology

## 2017-09-18 ENCOUNTER — Other Ambulatory Visit: Payer: Self-pay | Admitting: Adult Health

## 2017-09-20 ENCOUNTER — Ambulatory Visit: Payer: Medicare Other | Admitting: Occupational Therapy

## 2017-09-20 DIAGNOSIS — I890 Lymphedema, not elsewhere classified: Secondary | ICD-10-CM | POA: Diagnosis not present
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
## 2017-09-22 ENCOUNTER — Ambulatory Visit: Payer: Medicare Other | Admitting: Occupational Therapy

## 2017-09-22 DIAGNOSIS — I890 Lymphedema, not elsewhere classified: Secondary | ICD-10-CM | POA: Diagnosis not present
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
## 2017-09-23 ENCOUNTER — Ambulatory Visit: Payer: Medicare Other | Admitting: Occupational Therapy

## 2017-09-23 ENCOUNTER — Other Ambulatory Visit: Payer: Self-pay

## 2017-09-23 DIAGNOSIS — I890 Lymphedema, not elsewhere classified: Secondary | ICD-10-CM | POA: Diagnosis not present
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
## 2017-09-27 ENCOUNTER — Ambulatory Visit: Payer: Medicare Other | Admitting: Occupational Therapy

## 2017-09-27 DIAGNOSIS — I890 Lymphedema, not elsewhere classified: Secondary | ICD-10-CM
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
## 2017-09-29 ENCOUNTER — Encounter: Payer: Medicare Other | Admitting: Occupational Therapy

## 2017-09-30 ENCOUNTER — Encounter: Payer: Medicare Other | Admitting: Occupational Therapy

## 2017-10-05 ENCOUNTER — Encounter: Payer: Medicare Other | Admitting: Occupational Therapy

## 2017-10-06 ENCOUNTER — Ambulatory Visit: Payer: Medicare Other | Admitting: Occupational Therapy

## 2017-10-06 DIAGNOSIS — I890 Lymphedema, not elsewhere classified: Secondary | ICD-10-CM | POA: Diagnosis not present
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
## 2017-10-07 ENCOUNTER — Encounter: Payer: Medicare Other | Admitting: Occupational Therapy

## 2017-10-13 ENCOUNTER — Ambulatory Visit: Payer: Medicare Other | Attending: Cardiology | Admitting: Occupational Therapy

## 2017-10-13 DIAGNOSIS — I890 Lymphedema, not elsewhere classified: Secondary | ICD-10-CM | POA: Diagnosis not present
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
## 2017-10-20 ENCOUNTER — Ambulatory Visit: Payer: Medicare Other | Admitting: Occupational Therapy

## 2017-10-20 DIAGNOSIS — I890 Lymphedema, not elsewhere classified: Secondary | ICD-10-CM | POA: Diagnosis not present

## 2017-10-20 NOTE — Patient Instructions (Signed)
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
## 2017-10-27 ENCOUNTER — Encounter: Payer: Medicare Other | Admitting: Occupational Therapy

## 2017-11-03 ENCOUNTER — Encounter: Payer: Medicare Other | Admitting: Occupational Therapy

## 2017-11-07 ENCOUNTER — Other Ambulatory Visit: Payer: Self-pay | Admitting: Cardiology

## 2017-11-24 ENCOUNTER — Ambulatory Visit: Payer: Medicare Other | Admitting: Occupational Therapy

## 2017-12-01 ENCOUNTER — Ambulatory Visit: Payer: Medicare Other | Attending: Cardiology | Admitting: Occupational Therapy

## 2017-12-01 DIAGNOSIS — I890 Lymphedema, not elsewhere classified: Secondary | ICD-10-CM | POA: Diagnosis not present
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
## 2017-12-13 ENCOUNTER — Encounter: Payer: Self-pay | Admitting: Adult Health

## 2017-12-13 ENCOUNTER — Ambulatory Visit: Payer: Medicare Other | Admitting: Adult Health

## 2017-12-13 VITALS — Ht 62.0 in

## 2017-12-13 DIAGNOSIS — G4733 Obstructive sleep apnea (adult) (pediatric): Secondary | ICD-10-CM | POA: Diagnosis not present

## 2017-12-13 DIAGNOSIS — G2581 Restless legs syndrome: Secondary | ICD-10-CM

## 2017-12-13 DIAGNOSIS — R569 Unspecified convulsions: Secondary | ICD-10-CM | POA: Diagnosis not present

## 2017-12-13 DIAGNOSIS — Z9989 Dependence on other enabling machines and devices: Secondary | ICD-10-CM

## 2017-12-13 DIAGNOSIS — Z5181 Encounter for therapeutic drug level monitoring: Secondary | ICD-10-CM | POA: Diagnosis not present

## 2017-12-13 MED ORDER — PHENOBARBITAL 64.8 MG PO TABS: 129.6000 mg | ORAL_TABLET | Freq: Every day | ORAL | 5 refills | Status: DC

## 2017-12-13 MED ORDER — PRAMIPEXOLE DIHYDROCHLORIDE ER 0.75 MG PO TB24: 1.0000 | ORAL_TABLET | Freq: Every day | ORAL | 3 refills | Status: DC

## 2017-12-13 NOTE — Patient Instructions (Addendum)
Your Plan:  Continue phenobarbital for seizures Blood work today Engineer, civil (consulting)Continue Mirapex and Horizant Important to use CPAP nightly and greater than 4 hours each night. If your symptoms worsen or you develop new symptoms please let us know.    [facility] you [facility]. I hope we have been able to provide you high quality care today.  You may receive a patient satisfaction survey over the next few weeks. We would appreciate your feedback and comments so that we may continue to improve ourselves and the health of our patients.
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
## 2017-12-14 ENCOUNTER — Telehealth: Payer: Self-pay | Admitting: *Deleted

## 2017-12-14 LAB — CBC WITH DIFFERENTIAL/PLATELET
BASOS ABS: 0 10*3/uL (ref 0.0–0.2)
Basos: 0 %
EOS (ABSOLUTE): 0.2 10*3/uL (ref 0.0–0.4)
Eos: 4 %
Hematocrit: 34.9 % (ref 34.0–46.6)
Hemoglobin: 12.2 g/dL (ref 11.1–15.9)
IMMATURE GRANS (ABS): 0 10*3/uL (ref 0.0–0.1)
Immature Granulocytes: 0 %
LYMPHS: 45 %
Lymphocytes Absolute: 2.8 10*3/uL (ref 0.7–3.1)
MCH: 31 pg (ref 26.6–33.0)
MCHC: 35 g/dL (ref 31.5–35.7)
MCV: 89 fL (ref 79–97)
Monocytes Absolute: 0.4 10*3/uL (ref 0.1–0.9)
Monocytes: 7 %
NEUTROS PCT: 44 %
Neutrophils Absolute: 2.7 10*3/uL (ref 1.4–7.0)
PLATELETS: 180 10*3/uL (ref 150–450)
RBC: 3.93 x10E6/uL (ref 3.77–5.28)
RDW: 13.5 % (ref 12.3–15.4)
WBC: 6.1 10*3/uL (ref 3.4–10.8)

## 2017-12-14 LAB — COMPREHENSIVE METABOLIC PANEL
A/G RATIO: 1.8 (ref 1.2–2.2)
ALK PHOS: 105 IU/L (ref 39–117)
ALT: 50 IU/L — AB (ref 0–32)
AST: 56 IU/L — AB (ref 0–40)
Albumin: 4.3 g/dL (ref 3.6–4.8)
BILIRUBIN TOTAL: 0.3 mg/dL (ref 0.0–1.2)
BUN/Creatinine Ratio: 19 (ref 12–28)
BUN: 15 mg/dL (ref 8–27)
CHLORIDE: 103 mmol/L (ref 96–106)
CO2: 20 mmol/L (ref 20–29)
Calcium: 9.6 mg/dL (ref 8.7–10.3)
Creatinine, Ser: 0.8 mg/dL (ref 0.57–1.00)
GFR calc non Af Amer: 78 mL/min/{1.73_m2} (ref >59)
GFR, EST AFRICAN AMERICAN: 89 mL/min/{1.73_m2} (ref >59)
Globulin, Total: 2.4 g/dL (ref 1.5–4.5)
Glucose: 91 mg/dL (ref 65–99)
POTASSIUM: 4.5 mmol/L (ref 3.5–5.2)
Sodium: 139 mmol/L (ref 134–144)
Total Protein: 6.7 g/dL (ref 6.0–8.5)

## 2017-12-14 LAB — PHENOBARBITAL LEVEL: PHENOBARBITAL, SERUM: 19 ug/mL (ref 15–40)

## 2017-12-14 NOTE — Telephone Encounter (Signed)
LVM informing patient that her lab work is relatively unremarkable. Advised her liver enzymes, AST and ALT are slightly elevated. [name] will recheck this in 1 to 2 months.   Advised she doesn't need an appointment for repeat labs. Left number for any questions.
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
## 2018-01-12 NOTE — Progress Notes (Signed)
I agree with the assessment and plan as directed by NP .The patient is known to me .   Kalliope Riesen, MD  

## 2018-02-09 ENCOUNTER — Telehealth: Payer: Self-pay | Admitting: *Deleted

## 2018-02-09 DIAGNOSIS — Z5181 Encounter for therapeutic drug level monitoring: Secondary | ICD-10-CM

## 2018-02-09 NOTE — Telephone Encounter (Signed)
Spoke with patient and reminded her that NP wanted her to come in 2 months after Aug labs for repeat CMP to check liver enzymes. Advised no appointment needed, come during regular office hours with exception of lunch hour. She stated she will plan to come tomorrow. Reminded her this office closes at noon on Fridays. She verbalized understanding, appreciation for reminder call.  Order placed for CMP, future.

## 2018-02-10 ENCOUNTER — Other Ambulatory Visit (INDEPENDENT_AMBULATORY_CARE_PROVIDER_SITE_OTHER): Payer: Self-pay

## 2018-02-10 ENCOUNTER — Other Ambulatory Visit: Payer: Self-pay | Admitting: Cardiology

## 2018-02-10 DIAGNOSIS — Z5181 Encounter for therapeutic drug level monitoring: Secondary | ICD-10-CM

## 2018-02-10 DIAGNOSIS — Z0289 Encounter for other administrative examinations: Secondary | ICD-10-CM

## 2018-02-11 ENCOUNTER — Telehealth: Payer: Self-pay | Admitting: *Deleted

## 2018-02-11 ENCOUNTER — Other Ambulatory Visit: Payer: Self-pay | Admitting: Cardiology

## 2018-02-11 LAB — COMPREHENSIVE METABOLIC PANEL
A/G RATIO: 1.9 (ref 1.2–2.2)
ALK PHOS: 103 IU/L (ref 39–117)
ALT: 51 IU/L — ABNORMAL HIGH (ref 0–32)
AST: 46 IU/L — AB (ref 0–40)
Albumin: 4.3 g/dL (ref 3.6–4.8)
BILIRUBIN TOTAL: 0.3 mg/dL (ref 0.0–1.2)
BUN / CREAT RATIO: 15 (ref 12–28)
BUN: 12 mg/dL (ref 8–27)
CHLORIDE: 106 mmol/L (ref 96–106)
CO2: 21 mmol/L (ref 20–29)
CREATININE: 0.81 mg/dL (ref 0.57–1.00)
Calcium: 9.5 mg/dL (ref 8.7–10.3)
GFR calc Af Amer: 88 mL/min/{1.73_m2} (ref >59)
GFR calc non Af Amer: 76 mL/min/{1.73_m2} (ref >59)
GLOBULIN, TOTAL: 2.3 g/dL (ref 1.5–4.5)
Glucose: 87 mg/dL (ref 65–99)
POTASSIUM: 4.4 mmol/L (ref 3.5–5.2)
SODIUM: 144 mmol/L (ref 134–144)
Total Protein: 6.6 g/dL (ref 6.0–8.5)

## 2018-02-11 NOTE — Telephone Encounter (Signed)
-----   Message from Butch Penny, NP sent at 02/11/2018  9:29 AM EDT ----- Ast has decreased. We will continue to monitor. Please call patient with results.

## 2018-02-11 NOTE — Telephone Encounter (Signed)
-----   Message from Megan Millikan, NP sent at 02/11/2018  9:29 AM EDT ----- Ast has decreased. We will continue to monitor. Please call patient with results. 

## 2018-02-11 NOTE — Telephone Encounter (Signed)
Spoke to pt and relayed that the lab results per MM/NP AST had decreased. (better),  We will continue to monitor these overtime.  She verbalized understanding.

## 2018-03-01 ENCOUNTER — Ambulatory Visit: Payer: Medicare Other | Attending: Cardiology | Admitting: Occupational Therapy

## 2018-03-01 DIAGNOSIS — I890 Lymphedema, not elsewhere classified: Secondary | ICD-10-CM | POA: Diagnosis present
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
## 2018-03-30 ENCOUNTER — Other Ambulatory Visit: Payer: Self-pay | Admitting: Cardiology

## 2018-04-04 ENCOUNTER — Ambulatory Visit: Payer: Medicare Other | Attending: Cardiology | Admitting: Occupational Therapy

## 2018-04-04 DIAGNOSIS — I890 Lymphedema, not elsewhere classified: Secondary | ICD-10-CM | POA: Diagnosis present
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
## 2018-04-26 ENCOUNTER — Other Ambulatory Visit: Payer: Self-pay | Admitting: Cardiology

## 2018-05-16 ENCOUNTER — Encounter: Payer: Self-pay | Admitting: Adult Health

## 2018-05-17 ENCOUNTER — Ambulatory Visit: Payer: Medicare Other | Admitting: Adult Health

## 2018-05-17 ENCOUNTER — Encounter: Payer: Self-pay | Admitting: Adult Health

## 2018-05-17 VITALS — BP 136/74 | HR 73 | Ht 62.0 in | Wt 291.4 lb

## 2018-05-17 DIAGNOSIS — R569 Unspecified convulsions: Secondary | ICD-10-CM

## 2018-05-17 DIAGNOSIS — G2581 Restless legs syndrome: Secondary | ICD-10-CM

## 2018-05-17 DIAGNOSIS — Z9989 Dependence on other enabling machines and devices: Secondary | ICD-10-CM

## 2018-05-17 DIAGNOSIS — G4733 Obstructive sleep apnea (adult) (pediatric): Secondary | ICD-10-CM
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
## 2018-05-17 NOTE — Patient Instructions (Signed)
Your Plan:  Continue phenobarbital  Continue Mirapex and Horizant Continue using CPAP nightly and greater than 4 hours each night If your symptoms worsen or you develop new symptoms please let us know.   Thank you for coming to see [place] at [facility]. I hope we have been able to provide you high quality care today.  You may receive a patient satisfaction survey over the next few weeks. We would appreciate your feedback and comments so that we may continue to improve ourselves and the health of our patients.

## 2018-05-17 NOTE — Addendum Note (Signed)
Addended by: Enedina Finner on: 05/17/2018 03:21 PM   Modules accepted: Level of Service

## 2018-05-24 ENCOUNTER — Ambulatory Visit: Payer: Medicare Other | Attending: Cardiology | Admitting: Occupational Therapy

## 2018-05-24 DIAGNOSIS — I890 Lymphedema, not elsewhere classified: Secondary | ICD-10-CM
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
## 2018-05-24 NOTE — Patient Instructions (Signed)

## 2018-05-25 ENCOUNTER — Telehealth: Payer: Self-pay | Admitting: *Deleted

## 2018-05-25 NOTE — Telephone Encounter (Signed)
Started PA for [employer] on CMM, key :  [id number].

## 2018-05-25 NOTE — Telephone Encounter (Signed)
[facility]- Horizant 600 mg ER approved through 05/11/19, Medicare PArt D.  Approval letter successfully faxed to [facility], [address]

## 2018-06-09 ENCOUNTER — Other Ambulatory Visit: Payer: Self-pay | Admitting: Adult Health

## 2018-06-09 ENCOUNTER — Telehealth: Payer: Self-pay | Admitting: Adult Health

## 2018-06-09 DIAGNOSIS — G2581 Restless legs syndrome: Secondary | ICD-10-CM

## 2018-06-09 NOTE — Telephone Encounter (Signed)
Pt has called for a refill on her Pramipexole Dihydrochloride 0.75 MG TB24 [facility] [phone]

## 2018-06-10 NOTE — Telephone Encounter (Signed)
Refilled yesterday. 

## 2018-07-04 ENCOUNTER — Other Ambulatory Visit: Payer: Self-pay | Admitting: Adult Health

## 2018-07-05 NOTE — Telephone Encounter (Signed)
[place] Database Verified LR: 06/03/2018  Qty: 60 Pending appointment: 12/07/2018

## 2018-08-13 ENCOUNTER — Other Ambulatory Visit: Payer: Self-pay | Admitting: Cardiology

## 2018-08-15 NOTE — Telephone Encounter (Signed)
Amlodipine refilled.

## 2018-08-29 ENCOUNTER — Other Ambulatory Visit: Payer: Self-pay | Admitting: Cardiology

## 2018-08-29 NOTE — Telephone Encounter (Signed)
klor- con refilled. 

## 2018-10-29 ENCOUNTER — Other Ambulatory Visit: Payer: Self-pay | Admitting: Cardiology

## 2018-11-10 ENCOUNTER — Other Ambulatory Visit: Payer: Self-pay | Admitting: Adult Health

## 2018-11-10 DIAGNOSIS — G2581 Restless legs syndrome: Secondary | ICD-10-CM

## 2018-12-04 ENCOUNTER — Other Ambulatory Visit: Payer: Self-pay | Admitting: Adult Health

## 2018-12-07 ENCOUNTER — Ambulatory Visit: Payer: Medicare Other | Admitting: Adult Health

## 2018-12-15 ENCOUNTER — Other Ambulatory Visit: Payer: Self-pay

## 2018-12-15 MED ORDER — ROSUVASTATIN CALCIUM 20 MG PO TABS: 20.0000 mg | ORAL_TABLET | Freq: Every day | ORAL | 0 refills | Status: DC

## 2018-12-15 NOTE — Telephone Encounter (Signed)
Rx(s) sent to pharmacy electronically.  

## 2018-12-19 ENCOUNTER — Inpatient Hospital Stay (HOSPITAL_COMMUNITY)
Admission: EM | Admit: 2018-12-19 | Discharge: 2018-12-26 | DRG: 603 | Disposition: A | Discharge status: Home Health | Payer: Medicare Other | Attending: Internal Medicine | Admitting: Internal Medicine

## 2018-12-19 ENCOUNTER — Other Ambulatory Visit: Payer: Self-pay

## 2018-12-19 ENCOUNTER — Encounter (HOSPITAL_COMMUNITY): Payer: Self-pay

## 2018-12-19 ENCOUNTER — Emergency Department (HOSPITAL_COMMUNITY): Payer: Medicare Other

## 2018-12-19 DIAGNOSIS — G4733 Obstructive sleep apnea (adult) (pediatric): Secondary | ICD-10-CM

## 2018-12-19 DIAGNOSIS — E871 Hypo-osmolality and hyponatremia: Secondary | ICD-10-CM | POA: Diagnosis present

## 2018-12-19 DIAGNOSIS — Z6841 Body Mass Index (BMI) 40.0 and over, adult: Secondary | ICD-10-CM

## 2018-12-19 DIAGNOSIS — I890 Lymphedema, not elsewhere classified: Secondary | ICD-10-CM | POA: Diagnosis present

## 2018-12-19 DIAGNOSIS — K219 Gastro-esophageal reflux disease without esophagitis: Secondary | ICD-10-CM | POA: Diagnosis present

## 2018-12-19 DIAGNOSIS — Z7982 Long term (current) use of aspirin: Secondary | ICD-10-CM

## 2018-12-19 DIAGNOSIS — Z90710 Acquired absence of both cervix and uterus: Secondary | ICD-10-CM

## 2018-12-19 DIAGNOSIS — Z79899 Other long term (current) drug therapy: Secondary | ICD-10-CM

## 2018-12-19 DIAGNOSIS — I10 Essential (primary) hypertension: Secondary | ICD-10-CM | POA: Diagnosis present

## 2018-12-19 DIAGNOSIS — L03116 Cellulitis of left lower limb: Principal | ICD-10-CM | POA: Diagnosis present

## 2018-12-19 DIAGNOSIS — J45909 Unspecified asthma, uncomplicated: Secondary | ICD-10-CM | POA: Diagnosis present

## 2018-12-19 DIAGNOSIS — Z87891 Personal history of nicotine dependence: Secondary | ICD-10-CM

## 2018-12-19 DIAGNOSIS — G40909 Epilepsy, unspecified, not intractable, without status epilepticus: Secondary | ICD-10-CM

## 2018-12-19 DIAGNOSIS — G2581 Restless legs syndrome: Secondary | ICD-10-CM | POA: Diagnosis present

## 2018-12-19 DIAGNOSIS — E782 Mixed hyperlipidemia: Secondary | ICD-10-CM | POA: Diagnosis present

## 2018-12-19 DIAGNOSIS — E785 Hyperlipidemia, unspecified: Secondary | ICD-10-CM | POA: Diagnosis present

## 2018-12-19 DIAGNOSIS — L0390 Cellulitis, unspecified: Secondary | ICD-10-CM | POA: Diagnosis present

## 2018-12-19 DIAGNOSIS — E7800 Pure hypercholesterolemia, unspecified: Secondary | ICD-10-CM | POA: Diagnosis present

## 2018-12-19 DIAGNOSIS — E876 Hypokalemia: Secondary | ICD-10-CM | POA: Diagnosis present

## 2018-12-19 DIAGNOSIS — Z20828 Contact with and (suspected) exposure to other viral communicable diseases: Secondary | ICD-10-CM | POA: Diagnosis present

## 2018-12-19 DIAGNOSIS — Z7951 Long term (current) use of inhaled steroids: Secondary | ICD-10-CM

## 2018-12-19 LAB — COMPREHENSIVE METABOLIC PANEL
ALT: 74 U/L — ABNORMAL HIGH (ref 0–44)
AST: 113 U/L — ABNORMAL HIGH (ref 15–41)
Albumin: 2.7 g/dL — ABNORMAL LOW (ref 3.5–5.0)
Alkaline Phosphatase: 137 U/L — ABNORMAL HIGH (ref 38–126)
Anion gap: 12 (ref 5–15)
BUN: 10 mg/dL (ref 8–23)
CO2: 22 mmol/L (ref 22–32)
Calcium: 9.2 mg/dL (ref 8.9–10.3)
Chloride: 98 mmol/L (ref 98–111)
Creatinine, Ser: 0.95 mg/dL (ref 0.44–1.00)
GFR calc Af Amer: >60 mL/min (ref >60)
GFR calc non Af Amer: >60 mL/min (ref >60)
Glucose, Bld: 101 mg/dL — ABNORMAL HIGH (ref 70–99)
Potassium: 4 mmol/L (ref 3.5–5.1)
Sodium: 132 mmol/L — ABNORMAL LOW (ref 135–145)
Total Bilirubin: 0.6 mg/dL (ref 0.3–1.2)
Total Protein: 7.4 g/dL (ref 6.5–8.1)

## 2018-12-19 LAB — CBC WITH DIFFERENTIAL/PLATELET
Abs Immature Granulocytes: 0.41 10*3/uL — ABNORMAL HIGH (ref 0.00–0.07)
Basophils Absolute: 0 10*3/uL (ref 0.0–0.1)
Basophils Relative: 0 %
Eosinophils Absolute: 0.1 10*3/uL (ref 0.0–0.5)
Eosinophils Relative: 1 %
HCT: 31.9 % — ABNORMAL LOW (ref 36.0–46.0)
Hemoglobin: 10.4 g/dL — ABNORMAL LOW (ref 12.0–15.0)
Immature Granulocytes: 4 %
Lymphocytes Relative: 17 %
Lymphs Abs: 1.9 10*3/uL (ref 0.7–4.0)
MCH: 31.2 pg (ref 26.0–34.0)
MCHC: 32.6 g/dL (ref 30.0–36.0)
MCV: 95.8 fL (ref 80.0–100.0)
Monocytes Absolute: 0.9 10*3/uL (ref 0.1–1.0)
Monocytes Relative: 8 %
Neutro Abs: 8.3 10*3/uL — ABNORMAL HIGH (ref 1.7–7.7)
Neutrophils Relative %: 70 %
Platelets: 350 10*3/uL (ref 150–400)
RBC: 3.33 MIL/uL — ABNORMAL LOW (ref 3.87–5.11)
RDW: 13.2 % (ref 11.5–15.5)
WBC: 11.7 10*3/uL — ABNORMAL HIGH (ref 4.0–10.5)
nRBC: 0 % (ref 0.0–0.2)

## 2018-12-19 LAB — LACTIC ACID, PLASMA: Lactic Acid, Venous: 1 mmol/L (ref 0.5–1.9)

## 2018-12-19 LAB — PROTIME-INR
INR: 1.3 — ABNORMAL HIGH (ref 0.8–1.2)
Prothrombin Time: 15.6 seconds — ABNORMAL HIGH (ref 11.4–15.2)

## 2018-12-19 MED ORDER — ONDANSETRON HCL 4 MG PO TABS: 4.0000 mg | ORAL_TABLET | Freq: Four times a day (QID) | ORAL | Status: DC | PRN

## 2018-12-19 MED ORDER — PANTOPRAZOLE SODIUM 40 MG PO TBEC
40.0000 mg | DELAYED_RELEASE_TABLET | Freq: Every day | ORAL | Status: DC
  Administered 2018-12-20: 01:00:00 40 mg via ORAL
  Filled 2018-12-19: qty 1

## 2018-12-19 MED ORDER — PRAMIPEXOLE DIHYDROCHLORIDE 0.25 MG PO TABS
0.2500 mg | ORAL_TABLET | Freq: Every day | ORAL | Status: DC
  Administered 2018-12-20 – 2018-12-25 (×6): 0.25 mg via ORAL
  Filled 2018-12-19 (×9): qty 1

## 2018-12-19 MED ORDER — GABAPENTIN ENACARBIL ER 600 MG PO TBCR: 900.0000 mg | EXTENDED_RELEASE_TABLET | Freq: Every day | ORAL | Status: DC

## 2018-12-19 MED ORDER — KETOROLAC TROMETHAMINE 30 MG/ML IJ SOLN
30.0000 mg | Freq: Once | INTRAMUSCULAR | Status: AC
  Administered 2018-12-19: 22:00:00 30 mg via INTRAVENOUS
  Filled 2018-12-19: qty 1

## 2018-12-19 MED ORDER — ALBUTEROL SULFATE (2.5 MG/3ML) 0.083% IN NEBU
3.0000 mL | INHALATION_SOLUTION | RESPIRATORY_TRACT | Status: DC | PRN
  Administered 2018-12-20: 3 mL via RESPIRATORY_TRACT
  Filled 2018-12-19: qty 3

## 2018-12-19 MED ORDER — IRBESARTAN 150 MG PO TABS
150.0000 mg | ORAL_TABLET | Freq: Every day | ORAL | Status: DC
  Administered 2018-12-20 – 2018-12-26 (×7): 150 mg via ORAL
  Filled 2018-12-19 (×7): qty 1

## 2018-12-19 MED ORDER — SPIRONOLACTONE 25 MG PO TABS
25.0000 mg | ORAL_TABLET | Freq: Every day | ORAL | Status: DC
  Administered 2018-12-20 – 2018-12-21 (×2): 25 mg via ORAL
  Filled 2018-12-19 (×2): qty 1

## 2018-12-19 MED ORDER — MONTELUKAST SODIUM 10 MG PO TABS
10.0000 mg | ORAL_TABLET | Freq: Every day | ORAL | Status: DC
  Administered 2018-12-20 – 2018-12-25 (×7): 10 mg via ORAL
  Filled 2018-12-19 (×7): qty 1

## 2018-12-19 MED ORDER — ENOXAPARIN SODIUM 40 MG/0.4ML ~~LOC~~ SOLN: 40.0000 mg | SUBCUTANEOUS | Status: DC

## 2018-12-19 MED ORDER — AMLODIPINE BESYLATE 5 MG PO TABS
5.0000 mg | ORAL_TABLET | Freq: Every day | ORAL | Status: DC
  Administered 2018-12-20 – 2018-12-26 (×7): 5 mg via ORAL
  Filled 2018-12-19 (×7): qty 1

## 2018-12-19 MED ORDER — CLINDAMYCIN PHOSPHATE 900 MG/50ML IV SOLN
900.0000 mg | Freq: Once | INTRAVENOUS | Status: AC
  Administered 2018-12-19: 21:00:00 900 mg via INTRAVENOUS
  Filled 2018-12-19: qty 50

## 2018-12-19 MED ORDER — ROSUVASTATIN CALCIUM 20 MG PO TABS
20.0000 mg | ORAL_TABLET | Freq: Every day | ORAL | Status: DC
  Administered 2018-12-20 – 2018-12-25 (×7): 20 mg via ORAL
  Filled 2018-12-19 (×7): qty 1

## 2018-12-19 MED ORDER — FLUCONAZOLE 100 MG PO TABS
200.0000 mg | ORAL_TABLET | Freq: Once | ORAL | Status: AC
  Administered 2018-12-19: 22:00:00 200 mg via ORAL
  Filled 2018-12-19: qty 2

## 2018-12-19 MED ORDER — ACETAMINOPHEN 325 MG PO TABS
650.0000 mg | ORAL_TABLET | Freq: Four times a day (QID) | ORAL | Status: DC | PRN
  Administered 2018-12-20 – 2018-12-26 (×12): 650 mg via ORAL
  Filled 2018-12-19 (×12): qty 2

## 2018-12-19 MED ORDER — ONDANSETRON HCL 4 MG/2ML IJ SOLN: 4.0000 mg | Freq: Four times a day (QID) | INTRAMUSCULAR | Status: DC | PRN

## 2018-12-19 MED ORDER — PRAMIPEXOLE DIHYDROCHLORIDE ER 0.75 MG PO TB24
0.7500 mg | ORAL_TABLET | Freq: Every day | ORAL | Status: DC
  Administered 2018-12-20 – 2018-12-25 (×6): 0.75 mg via ORAL
  Filled 2018-12-19 (×7): qty 1

## 2018-12-19 MED ORDER — PHENOBARBITAL 32.4 MG PO TABS
129.6000 mg | ORAL_TABLET | Freq: Every day | ORAL | Status: DC
  Administered 2018-12-20 – 2018-12-25 (×7): 129.6 mg via ORAL
  Filled 2018-12-19 (×7): qty 4

## 2018-12-19 MED ORDER — SODIUM CHLORIDE 0.9 % IV SOLN
2.0000 g | INTRAVENOUS | Status: DC
  Administered 2018-12-20 – 2018-12-25 (×7): 2 g via INTRAVENOUS
  Filled 2018-12-19 (×7): qty 20

## 2018-12-19 MED ORDER — ACETAMINOPHEN 650 MG RE SUPP: 650.0000 mg | Freq: Four times a day (QID) | RECTAL | Status: DC | PRN
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
## 2018-12-19 NOTE — ED Provider Notes (Signed)
I saw and evaluated the patient, reviewed the resident's note and I agree with the findings and plan.  EKG:   68 year old female presents with left lower extremity cellulitis with fever.  Lactate normal.  Blood pressure stable.  Started on IV antibiotics and will admitted to the hospital service.   Lacretia Leigh, MD 12/19/18 2235

## 2018-12-19 NOTE — ED Triage Notes (Signed)
Pt arrives POV for eval of blt leg cellulitis onset 1 week PTA. Pt reports hx of lymphedema, denies known injury or cuts to legs. Febrile in triage, redness and swelling obviously to blt legs.
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
## 2018-12-20 DIAGNOSIS — G4733 Obstructive sleep apnea (adult) (pediatric): Secondary | ICD-10-CM

## 2018-12-20 DIAGNOSIS — E785 Hyperlipidemia, unspecified: Secondary | ICD-10-CM | POA: Diagnosis present

## 2018-12-20 DIAGNOSIS — Z9989 Dependence on other enabling machines and devices: Secondary | ICD-10-CM

## 2018-12-20 DIAGNOSIS — Z7982 Long term (current) use of aspirin: Secondary | ICD-10-CM | POA: Diagnosis not present

## 2018-12-20 DIAGNOSIS — E871 Hypo-osmolality and hyponatremia: Secondary | ICD-10-CM | POA: Diagnosis present

## 2018-12-20 DIAGNOSIS — K219 Gastro-esophageal reflux disease without esophagitis: Secondary | ICD-10-CM | POA: Diagnosis present

## 2018-12-20 DIAGNOSIS — G40909 Epilepsy, unspecified, not intractable, without status epilepticus: Secondary | ICD-10-CM | POA: Diagnosis present

## 2018-12-20 DIAGNOSIS — I10 Essential (primary) hypertension: Secondary | ICD-10-CM | POA: Diagnosis present

## 2018-12-20 DIAGNOSIS — E7800 Pure hypercholesterolemia, unspecified: Secondary | ICD-10-CM | POA: Diagnosis present

## 2018-12-20 DIAGNOSIS — Z87891 Personal history of nicotine dependence: Secondary | ICD-10-CM | POA: Diagnosis not present

## 2018-12-20 DIAGNOSIS — Z7951 Long term (current) use of inhaled steroids: Secondary | ICD-10-CM | POA: Diagnosis not present

## 2018-12-20 DIAGNOSIS — Z79899 Other long term (current) drug therapy: Secondary | ICD-10-CM | POA: Diagnosis not present

## 2018-12-20 DIAGNOSIS — E782 Mixed hyperlipidemia: Secondary | ICD-10-CM | POA: Diagnosis not present

## 2018-12-20 DIAGNOSIS — L03116 Cellulitis of left lower limb: Secondary | ICD-10-CM | POA: Diagnosis present

## 2018-12-20 DIAGNOSIS — Z20828 Contact with and (suspected) exposure to other viral communicable diseases: Secondary | ICD-10-CM | POA: Diagnosis present

## 2018-12-20 DIAGNOSIS — G2581 Restless legs syndrome: Secondary | ICD-10-CM | POA: Diagnosis present

## 2018-12-20 DIAGNOSIS — J45909 Unspecified asthma, uncomplicated: Secondary | ICD-10-CM | POA: Diagnosis present

## 2018-12-20 DIAGNOSIS — L0390 Cellulitis, unspecified: Secondary | ICD-10-CM | POA: Diagnosis present

## 2018-12-20 DIAGNOSIS — E876 Hypokalemia: Secondary | ICD-10-CM | POA: Diagnosis present

## 2018-12-20 DIAGNOSIS — Z90710 Acquired absence of both cervix and uterus: Secondary | ICD-10-CM | POA: Diagnosis not present

## 2018-12-20 DIAGNOSIS — I890 Lymphedema, not elsewhere classified: Secondary | ICD-10-CM | POA: Diagnosis present

## 2018-12-20 DIAGNOSIS — Z6841 Body Mass Index (BMI) 40.0 and over, adult: Secondary | ICD-10-CM | POA: Diagnosis not present

## 2018-12-20 LAB — CBC
HCT: 30.1 % — ABNORMAL LOW (ref 36.0–46.0)
Hemoglobin: 9.9 g/dL — ABNORMAL LOW (ref 12.0–15.0)
MCH: 31.2 pg (ref 26.0–34.0)
MCHC: 32.9 g/dL (ref 30.0–36.0)
MCV: 95 fL (ref 80.0–100.0)
Platelets: 330 10*3/uL (ref 150–400)
RBC: 3.17 MIL/uL — ABNORMAL LOW (ref 3.87–5.11)
RDW: 13.2 % (ref 11.5–15.5)
WBC: 11.6 10*3/uL — ABNORMAL HIGH (ref 4.0–10.5)
nRBC: 0 % (ref 0.0–0.2)

## 2018-12-20 LAB — COMPREHENSIVE METABOLIC PANEL
ALT: 72 U/L — ABNORMAL HIGH (ref 0–44)
AST: 106 U/L — ABNORMAL HIGH (ref 15–41)
Albumin: 2.4 g/dL — ABNORMAL LOW (ref 3.5–5.0)
Alkaline Phosphatase: 130 U/L — ABNORMAL HIGH (ref 38–126)
Anion gap: 9 (ref 5–15)
BUN: 10 mg/dL (ref 8–23)
CO2: 22 mmol/L (ref 22–32)
Calcium: 8.9 mg/dL (ref 8.9–10.3)
Chloride: 100 mmol/L (ref 98–111)
Creatinine, Ser: 0.87 mg/dL (ref 0.44–1.00)
GFR calc Af Amer: >60 mL/min (ref >60)
GFR calc non Af Amer: >60 mL/min (ref >60)
Glucose, Bld: 109 mg/dL — ABNORMAL HIGH (ref 70–99)
Potassium: 3.6 mmol/L (ref 3.5–5.1)
Sodium: 131 mmol/L — ABNORMAL LOW (ref 135–145)
Total Bilirubin: 0.8 mg/dL (ref 0.3–1.2)
Total Protein: 6.9 g/dL (ref 6.5–8.1)

## 2018-12-20 LAB — URINALYSIS, ROUTINE W REFLEX MICROSCOPIC
Bilirubin Urine: NEGATIVE
Glucose, UA: NEGATIVE mg/dL
Hgb urine dipstick: NEGATIVE
Ketones, ur: NEGATIVE mg/dL
Leukocytes,Ua: NEGATIVE
Nitrite: NEGATIVE
Protein, ur: 100 mg/dL — AB
Specific Gravity, Urine: 1.011 (ref 1.005–1.030)
pH: 6 (ref 5.0–8.0)

## 2018-12-20 LAB — SURGICAL PCR SCREEN
MRSA, PCR: NEGATIVE
Staphylococcus aureus: POSITIVE — AB

## 2018-12-20 LAB — MAGNESIUM: Magnesium: 1.8 mg/dL (ref 1.7–2.4)

## 2018-12-20 LAB — SARS CORONAVIRUS 2 (TAT 6-24 HRS): SARS Coronavirus 2: NEGATIVE

## 2018-12-20 LAB — HIV ANTIBODY (ROUTINE TESTING W REFLEX): HIV Screen 4th Generation wRfx: NONREACTIVE

## 2018-12-20 MED ORDER — POTASSIUM CHLORIDE CRYS ER 20 MEQ PO TBCR
40.0000 meq | EXTENDED_RELEASE_TABLET | Freq: Once | ORAL | Status: AC
  Administered 2018-12-20: 40 meq via ORAL
  Filled 2018-12-20: qty 2

## 2018-12-20 MED ORDER — CHLORHEXIDINE GLUCONATE CLOTH 2 % EX PADS
6.0000 | MEDICATED_PAD | Freq: Every day | CUTANEOUS | Status: AC
  Administered 2018-12-20 – 2018-12-22 (×3): 6 via TOPICAL

## 2018-12-20 MED ORDER — LORATADINE 10 MG PO TABS
10.0000 mg | ORAL_TABLET | Freq: Every day | ORAL | Status: DC
  Administered 2018-12-20 – 2018-12-25 (×6): 10 mg via ORAL
  Filled 2018-12-20 (×6): qty 1

## 2018-12-20 MED ORDER — TRAMADOL HCL 50 MG PO TABS
50.0000 mg | ORAL_TABLET | Freq: Four times a day (QID) | ORAL | Status: DC | PRN
  Administered 2018-12-20 – 2018-12-23 (×6): 50 mg via ORAL
  Filled 2018-12-20 (×6): qty 1

## 2018-12-20 MED ORDER — ENOXAPARIN SODIUM 80 MG/0.8ML ~~LOC~~ SOLN
70.0000 mg | SUBCUTANEOUS | Status: DC
  Administered 2018-12-20 – 2018-12-26 (×7): 70 mg via SUBCUTANEOUS
  Filled 2018-12-20 (×7): qty 0.7

## 2018-12-20 MED ORDER — VITAMIN D 25 MCG (1000 UNIT) PO TABS
3000.0000 [IU] | ORAL_TABLET | Freq: Every day | ORAL | Status: DC
  Administered 2018-12-20 – 2018-12-26 (×7): 3000 [IU] via ORAL
  Filled 2018-12-20 (×8): qty 3

## 2018-12-20 MED ORDER — GABAPENTIN 300 MG PO CAPS
900.0000 mg | ORAL_CAPSULE | Freq: Every day | ORAL | Status: AC
  Administered 2018-12-20: 900 mg via ORAL
  Filled 2018-12-20: qty 3

## 2018-12-20 MED ORDER — LORATADINE 10 MG PO TABS: 5.0000 mg | ORAL_TABLET | Freq: Every day | ORAL | Status: DC

## 2018-12-20 MED ORDER — FERROUS SULFATE 325 (65 FE) MG PO TABS
650.0000 mg | ORAL_TABLET | Freq: Every day | ORAL | Status: DC
  Administered 2018-12-21 – 2018-12-26 (×6): 650 mg via ORAL
  Filled 2018-12-20 (×6): qty 2

## 2018-12-20 MED ORDER — MUPIROCIN 2 % EX OINT
1.0000 "application " | TOPICAL_OINTMENT | Freq: Two times a day (BID) | CUTANEOUS | Status: AC
  Administered 2018-12-20 – 2018-12-24 (×9): 1 via NASAL
  Filled 2018-12-20 (×2): qty 22

## 2018-12-20 MED ORDER — HYDROCORTISONE 1 % EX OINT
1.0000 "application " | TOPICAL_OINTMENT | Freq: Two times a day (BID) | CUTANEOUS | Status: DC | PRN
  Administered 2018-12-25 (×2): 1 via TOPICAL
  Filled 2018-12-20 (×2): qty 28

## 2018-12-20 MED ORDER — HYDROCERIN EX CREA
TOPICAL_CREAM | Freq: Two times a day (BID) | CUTANEOUS | Status: DC
  Administered 2018-12-20 – 2018-12-21 (×4): via TOPICAL
  Administered 2018-12-22: 1 via TOPICAL
  Administered 2018-12-22 – 2018-12-23 (×2): via TOPICAL
  Administered 2018-12-23: 1 via TOPICAL
  Administered 2018-12-24: 10:00:00 via TOPICAL
  Administered 2018-12-24: 1 via TOPICAL
  Administered 2018-12-25 – 2018-12-26 (×3): via TOPICAL
  Filled 2018-12-20: qty 113

## 2018-12-20 MED ORDER — PANTOPRAZOLE SODIUM 40 MG PO TBEC
40.0000 mg | DELAYED_RELEASE_TABLET | Freq: Every day | ORAL | Status: DC
  Administered 2018-12-20 – 2018-12-26 (×7): 40 mg via ORAL
  Filled 2018-12-20 (×7): qty 1

## 2018-12-20 MED ORDER — FLUTICASONE PROPIONATE 50 MCG/ACT NA SUSP
2.0000 | Freq: Every day | NASAL | Status: DC
  Administered 2018-12-20 – 2018-12-26 (×7): 2 via NASAL
  Filled 2018-12-20: qty 16

## 2018-12-20 MED ORDER — FUROSEMIDE 40 MG PO TABS
40.0000 mg | ORAL_TABLET | Freq: Every day | ORAL | Status: DC
  Administered 2018-12-20 – 2018-12-21 (×2): 40 mg via ORAL
  Filled 2018-12-20 (×2): qty 1

## 2018-12-20 MED ORDER — FLUCONAZOLE 200 MG PO TABS
200.0000 mg | ORAL_TABLET | Freq: Once | ORAL | Status: AC
  Administered 2018-12-20: 200 mg via ORAL
  Filled 2018-12-20: qty 1

## 2018-12-20 MED ORDER — MAGNESIUM SULFATE 2 GM/50ML IV SOLN
2.0000 g | Freq: Once | INTRAVENOUS | Status: AC
  Administered 2018-12-20: 12:00:00 2 g via INTRAVENOUS
  Filled 2018-12-20: qty 50

## 2018-12-20 MED ORDER — ASPIRIN EC 81 MG PO TBEC
81.0000 mg | DELAYED_RELEASE_TABLET | Freq: Two times a day (BID) | ORAL | Status: DC
  Administered 2018-12-20 – 2018-12-26 (×13): 81 mg via ORAL
  Filled 2018-12-20 (×13): qty 1

## 2018-12-20 MED ORDER — BIOTIN 5000 MCG PO CAPS: 5000.0000 ug | ORAL_CAPSULE | Freq: Two times a day (BID) | ORAL | Status: DC

## 2018-12-20 NOTE — Plan of Care (Signed)
  Problem: Skin Integrity: Goal: Risk for impaired skin integrity will decrease Outcome: Progressing   Problem: Elimination: Goal: Will not experience complications related to bowel motility Outcome: Progressing   Problem: Safety: Goal: Ability to remain free from injury will improve Outcome: Progressing

## 2018-12-20 NOTE — Progress Notes (Addendum)
[facility] PROGRESS NOTE  Denver FasterDale H Knabe JXB:[id number] DOB: 1950-07-24 DOA: 12/19/2018 PCP: Mila PalmerWolters, Sharon, MD  Assessment/Plan:  Cellulitis of left lower extremity in setting of chronic bilateral lower extremity lymphedema: max temp 100.8. mild leukocytosis lactic acid within limits of normal. Failed outpatient treatment with oral Bactrim.  Received IV clindamycin in the ED. -Switch to IV ceftriaxone 2 g daily -keep elevated -monitor  Hypertension: fair control. Home meds include spironolactone, amlodipine, and valsartan, lasix, zaroxolyn, HCTZ. -continuing spironolactone, amlodipine and valsartan -will resume lasix at 1/2 home dose as BP low end of normal -will hold home zaroxolyn and hctz -monitor closely  Hyperlipidemia:  Continue rosuvastatin.  Seizure disorder: Reportedly secondary to cerebral aneurysm status post clipping.  She states she cannot have MRI imaging because of clips in place.  Patient denies any seizures in many years. - Continue phenobarbital. -of note patient wants it noted in chart that she is unable to have MRI's due to clips  Restless leg syndrome: patient reports her medication not available here. Husband will bring home med.  Lymphedema: chart review indicates referred to lymphedema clinic. Reports has not been since covid. Home meds include several diuretics. See #2  Obesity: BMI 54.4 -nutritional consult  OSA on CPAP: Continue CPAP nightly.  Code Status: full Family Communication: patient updated Disposition Plan: home when ready    Consultants:    Procedures:    Antibiotics:  Clindamycin 1 dose  Rocephin 12/19/18>>  HPI/Subjective: Sitting up in bed watching tv. Denies pain/discomfort  Objective: Vitals:   12/20/18 0339 12/20/18 0839  BP: (!) 132/54 (!) 121/43  Pulse: 81 86  Resp: 18 16  Temp: 98.8 F (37.1 C) 99.3 F (37.4 C)  SpO2: 96% 95%    Intake/Output Summary (Last 24 hours) at 12/20/2018 [id number] Last  data filed at 12/20/2018 0100 Gross per 24 hour  Intake 390 ml  Output -  Net 390 ml   Filed Weights   12/19/18 1351  Weight: 135 kg    Exam:   General:  Obese awake no acute distress, cooperative  Cardiovascular: rrr +murmur bilateral LE edema left leg with erythema  Respiratory: normal effort BS distant but clear no crackles   Abdomen: obese soft +BS no guarding or rebounding  Musculoskeletal: joints without swelling/erythema  Skin: left leg with erythema and heat up to just above the knee. Healing abrasion on shin. No drainage.    Data Reviewed: Basic Metabolic Panel: Recent Labs  Lab 12/19/18 1447 12/20/18 0112  NA 132* 131*  K 4.0 3.6  CL 98 100  CO2 22 22  GLUCOSE 101* 109*  BUN 10 10  CREATININE 0.95 0.87  CALCIUM 9.2 8.9   Liver Function Tests: Recent Labs  Lab 12/19/18 1447 12/20/18 0112  AST 113* 106*  ALT 74* 72*  ALKPHOS 137* 130*  BILITOT 0.6 0.8  PROT 7.4 6.9  ALBUMIN 2.7* 2.4*   No results for input(s): LIPASE, AMYLASE in the last 168 hours. No results for input(s): AMMONIA in the last 168 hours. CBC: Recent Labs  Lab 12/19/18 1447 12/20/18 0112  WBC 11.7* 11.6*  NEUTROABS 8.3*  --   HGB 10.4* 9.9*  HCT 31.9* 30.1*  MCV 95.8 95.0  PLT 350 330   Cardiac Enzymes: No results for input(s): CKTOTAL, CKMB, CKMBINDEX, TROPONINI in the last 168 hours. BNP (last 3 results) No results for input(s): BNP in the last 8760 hours.  ProBNP (last 3 results) No results for input(s): PROBNP in the last 8760 hours.  CBG: No results for input(s): GLUCAP in the last 168 hours.  Recent Results (from the past 240 hour(s))  Surgical pcr screen     Status: Abnormal   Collection Time: 12/20/18 12:28 AM   Specimen: Aptima Multi Swab; Nasal Swab  Result Value Ref Range Status   MRSA, PCR NEGATIVE NEGATIVE Final   Staphylococcus aureus POSITIVE (A) NEGATIVE Final    Comment: (NOTE) The Xpert SA Assay (FDA approved for NASAL specimens in patients  59 years of age and older), is one component of a comprehensive surveillance program. It is not intended to diagnose infection nor to guide or monitor treatment. Performed at [facility], [place] [address], [place], [place] [address]      Studies: Dg Chest 2 View  Result Date: 12/19/2018 CLINICAL DATA:  Fever EXAM: CHEST - 2 VIEW COMPARISON:  July 02, 2015 FINDINGS: No edema or consolidation. Heart size and pulmonary vascularity are normal. No adenopathy. No bone lesions. There is aortic atherosclerosis. IMPRESSION: No edema or consolidation. Heart size within normal limits. Aortic Atherosclerosis (ICD10-I70.0). Electronically Signed   By: Lowella Grip III M.D.   On: 12/19/2018 15:41    Scheduled Meds: . amLODipine  5 mg Oral Daily  . enoxaparin (LOVENOX) injection  70 mg Subcutaneous Q24H  . fluconazole  200 mg Oral Once  . Gabapentin Enacarbil  1,200 mg Oral QHS  . irbesartan  150 mg Oral Daily  . montelukast  10 mg Oral QHS  . pantoprazole  40 mg Oral QHS  . PHENobarbital  129.6 mg Oral QHS  . potassium chloride  40 mEq Oral Once  . pramipexole  0.25 mg Oral q1800  . Pramipexole Dihydrochloride  0.75 mg Oral QHS  . rosuvastatin  20 mg Oral QHS  . spironolactone  25 mg Oral Daily   Continuous Infusions: . cefTRIAXone (ROCEPHIN)  IV 2 g (12/20/18 0059)    Principal Problem:   Cellulitis of left lower extremity Active Problems:   Essential hypertension   Restless legs syndrome (RLS)   Seizure disorder (HCC)   HLD (hyperlipidemia)   OSA on CPAP   Morbid obesity due to excess calories (HCC)   Hyponatremia    Time spent: 40 minutes    Otero NP [facility]  If 7PM-7AM, please contact night-coverage at [email or web address], password [facility] 12/20/2018, 9:21 AM  LOS: 0 days

## 2018-12-20 NOTE — Progress Notes (Signed)
PHARMACIST - PHYSICIAN ORDER COMMUNICATION  CONCERNING: P&T Medication Policy on Herbal Medications  DESCRIPTION:  This patient's order for:  Biotin  has been noted.  This product(s) is classified as an "herbal" or natural product. Due to a lack of definitive safety studies or FDA approval, nonstandard manufacturing practices, plus the potential risk of unknown drug-drug interactions while on inpatient medications, the Pharmacy and Therapeutics Committee does not permit the use of "herbal" or natural products of this type within [facility]   ACTION TAKEN: The pharmacy department is unable to verify this order at this time. Please reevaluate patient's clinical condition at discharge and address if the herbal or natural product(s) should be resumed at that time.  Arty Baumgartner, [place] Pager: [phone] or phone: [phone] 12/20/2018 10:27 AM

## 2018-12-21 LAB — CBC WITH DIFFERENTIAL/PLATELET
Abs Immature Granulocytes: 0.25 10*3/uL — ABNORMAL HIGH (ref 0.00–0.07)
Basophils Absolute: 0 10*3/uL (ref 0.0–0.1)
Basophils Relative: 0 %
Eosinophils Absolute: 0.2 10*3/uL (ref 0.0–0.5)
Eosinophils Relative: 2 %
HCT: 29 % — ABNORMAL LOW (ref 36.0–46.0)
Hemoglobin: 9.5 g/dL — ABNORMAL LOW (ref 12.0–15.0)
Immature Granulocytes: 3 %
Lymphocytes Relative: 24 %
Lymphs Abs: 2.4 10*3/uL (ref 0.7–4.0)
MCH: 31.5 pg (ref 26.0–34.0)
MCHC: 32.8 g/dL (ref 30.0–36.0)
MCV: 96 fL (ref 80.0–100.0)
Monocytes Absolute: 0.8 10*3/uL (ref 0.1–1.0)
Monocytes Relative: 8 %
Neutro Abs: 6 10*3/uL (ref 1.7–7.7)
Neutrophils Relative %: 63 %
Platelets: 354 10*3/uL (ref 150–400)
RBC: 3.02 MIL/uL — ABNORMAL LOW (ref 3.87–5.11)
RDW: 13.4 % (ref 11.5–15.5)
WBC: 9.6 10*3/uL (ref 4.0–10.5)
nRBC: 0 % (ref 0.0–0.2)

## 2018-12-21 LAB — HEPATITIS PANEL, ACUTE
HCV Ab: <0.1 s/co ratio (ref 0.0–0.9)
Hep A IgM: NEGATIVE
Hep B C IgM: NEGATIVE
Hepatitis B Surface Ag: NEGATIVE

## 2018-12-21 LAB — BASIC METABOLIC PANEL
Anion gap: 10 (ref 5–15)
BUN: 8 mg/dL (ref 8–23)
CO2: 24 mmol/L (ref 22–32)
Calcium: 8.8 mg/dL — ABNORMAL LOW (ref 8.9–10.3)
Chloride: 101 mmol/L (ref 98–111)
Creatinine, Ser: 0.72 mg/dL (ref 0.44–1.00)
GFR calc Af Amer: >60 mL/min (ref >60)
GFR calc non Af Amer: >60 mL/min (ref >60)
Glucose, Bld: 90 mg/dL (ref 70–99)
Potassium: 3.5 mmol/L (ref 3.5–5.1)
Sodium: 135 mmol/L (ref 135–145)

## 2018-12-21 LAB — MAGNESIUM: Magnesium: 2.1 mg/dL (ref 1.7–2.4)

## 2018-12-21 MED ORDER — HYDROCHLOROTHIAZIDE 25 MG PO TABS
50.0000 mg | ORAL_TABLET | Freq: Every day | ORAL | Status: DC
  Administered 2018-12-21 – 2018-12-26 (×6): 50 mg via ORAL
  Filled 2018-12-21 (×6): qty 2

## 2018-12-21 MED ORDER — FUROSEMIDE 40 MG PO TABS
80.0000 mg | ORAL_TABLET | Freq: Every day | ORAL | Status: DC
  Administered 2018-12-22 – 2018-12-26 (×5): 80 mg via ORAL
  Filled 2018-12-21 (×6): qty 2

## 2018-12-21 MED ORDER — NABUMETONE 500 MG PO TABS
750.0000 mg | ORAL_TABLET | Freq: Two times a day (BID) | ORAL | Status: DC
  Administered 2018-12-21 – 2018-12-26 (×10): 750 mg via ORAL
  Filled 2018-12-21 (×11): qty 2

## 2018-12-21 MED ORDER — SPIRONOLACTONE 25 MG PO TABS
50.0000 mg | ORAL_TABLET | Freq: Every day | ORAL | Status: DC
  Administered 2018-12-21 – 2018-12-26 (×6): 50 mg via ORAL
  Filled 2018-12-21 (×6): qty 2

## 2018-12-21 MED ORDER — GABAPENTIN 300 MG PO CAPS
600.0000 mg | ORAL_CAPSULE | Freq: Every day | ORAL | Status: DC
  Administered 2018-12-22 – 2018-12-25 (×5): 600 mg via ORAL
  Filled 2018-12-21 (×5): qty 2

## 2018-12-21 MED ORDER — METOLAZONE 5 MG PO TABS
5.0000 mg | ORAL_TABLET | Freq: Every day | ORAL | Status: DC
  Administered 2018-12-21 – 2018-12-26 (×6): 5 mg via ORAL
  Filled 2018-12-21 (×6): qty 1

## 2018-12-21 NOTE — Plan of Care (Signed)
  Problem: Skin Integrity: Goal: Risk for impaired skin integrity will decrease Outcome: Progressing   Problem: Pain Managment: Goal: General experience of comfort will improve Outcome: Progressing   Problem: Activity: Goal: Risk for activity intolerance will decrease Outcome: Progressing

## 2018-12-21 NOTE — Progress Notes (Signed)
PT stated that tramadol was not helping her pain, MD notified to see if we can get something else ordered awaiting response.

## 2018-12-21 NOTE — Progress Notes (Signed)
[facility] PROGRESS NOTE  JILLIANA BURKES DXA:[id number] DOB: May 15, 1950 DOA: 12/19/2018 PCP: [name] Jordan, MD  HPI: [name] is a 68 y.o. female with medical history significant for hypertension, hyperlipidemia, seizure disorder, chronic lymphedema of both legs, GERD, restless leg syndrome, and OSA on CPAP who presents to the ED for cellulitis of left lower extremity. Patient stated that about little more than 1 week ago she had itching at her left lower leg she tried to scratch with a "spaghetti spoon."  She says she was a little too enthusiastic and did break skin.  Over the next week she has noticed redness of her left leg with increased warmth to touch.  She saw her primary care [facility] physicians) on 12/16/2018 was prescribed Bactrim which she started taking same day.  Despite antibiotics she had progressive erythema which increased from below the knee to her lower thigh.  She therefore presented to the ED for further evaluation and management. She denies any subjective fevers, chills, diaphoresis, chest pain, dyspnea, cough, abdominal pain, or dysuria. In ED: Initial vitals showed BP 153/68, pulse 80, RR 18, temp 101 Fahrenheit, SPO2 94% on room air. Labs notable for WBC 11.7, hemoglobin 10.4, platelets 350,000, sodium 132, potassium 4.0, bicarb 22, creatinine 0.95, AST 113, ALT 34, alk phos 137, T bili 0.6, lactic acid 1.0.  1 of 2 blood cultures obtained and pending. 2 view chest x-ray was without focal consolidation, edema, or effusion. Patient was given IV clindamycin and Toradol and the hospitalist service was consulted to admit for further evaluation management.   Assessment/Plan:  Cellulitis of left lower extremity in setting of chronic bilateral lower extremity lymphedema:  max temp 100.8.  mild leukocytosis 11.7 Failed outpatient treatment with oral Bactrim.   Received IV clindamycin in the ED. Switch to IV ceftriaxone 2 g daily Nurse to outline erythema today - if  worsening tomorrow consider escalating treatment to vancomycin  Ambulatory dysfunction, multifactorial -Patient indicates more acutely unable to walk due to pain and swelling secondary to above -Patient indicates a year ago she would be able to ambulate a few 100 feet without difficulty however over the past few months has been becoming more weak and debilitated likely secondary to poor exercise regimen  Hypertension, essential, somewhat poorly controlled -Hhome meds include spironolactone, amlodipine, and valsartan(irbesartan equivalent in house), lasix, zaroxolyn, HCTZ. -Resume all home medications at previous home doses given poor control of blood pressure currently and ongoing edema of lower extremities -follow clinically  Hyperlipidemia:  Continue rosuvastatin.  Seizure disorder: Reportedly secondary to cerebral aneurysm status post clipping.  She states she cannot have MRI imaging because of clips in place.  Patient denies any seizures in many years. - Continue phenobarbital. -of note patient wants it noted in chart that she is unable to have MRI's due to clips  Restless leg syndrome:  Resume home Mirapex  Lymphedema:  chart review indicates referred to lymphedema clinic. Reports has not been since covid. Home meds include several diuretics.  Obesity: BMI 54.4 -nutritional consult -Lengthy discussion at bedside about need for increased exercise tolerance and calorie restriction  OSA on CPAP: Continue CPAP nightly.  Code Status: full Disposition Plan: Patient continues to require IV antibiotics with questionably worsening erythema and cellulitis.  Continue to follow clinically, once patient's infection is under control consider transition to p.o. antibiotics and discharge home at that time.  Likely require additional 24 to 48 hours of close monitoring, IV antibiotics given failure of outpatient therapy previously.  Antibiotics:  Clindamycin 1 dose  Rocephin 12/19/18>>  ongoing  HPI/Subjective: No acute issues or events overnight, indicates previously felt feverish yesterday but this is improved.. Denies pain/discomfort, chest pain, shortness breath, nausea, vomiting, diarrhea, constipation, headache, fevers, chills   Objective: Vitals:   12/21/18 0420 12/21/18 0420  BP: (!) 158/63 (!) 158/63  Pulse: 74 74  Resp: 18 18  Temp: 98.8 F (37.1 C) 98.8 F (37.1 C)  SpO2: 94% 94%    Intake/Output Summary (Last 24 hours) at 12/21/2018 1319 Last data filed at 12/21/2018 0900 Gross per 24 hour  Intake 1211.31 ml  Output -  Net 1211.31 ml   Filed Weights   12/19/18 1351 12/21/18 0420  Weight: 135 kg 134.8 kg    Exam:   General:  Obese awake no acute distress, cooperative  Cardiovascular: rrr +murmur; BLE 2+ pitting edema to the knee  Respiratory: normal effort BS distant but clear no crackles   Abdomen: obese soft +BS no guarding or rebounding  Musculoskeletal: joints without swelling/erythema  Skin: left leg skin shiny with blanching erythema and tenderness to just above the knee. Healing abrasion on shin. No drainage.    Data Reviewed: Basic Metabolic Panel: Recent Labs  Lab 12/19/18 1447 12/20/18 0112 12/20/18 0854 12/21/18 0303  NA 132* 131*  --  135  K 4.0 3.6  --  3.5  CL 98 100  --  101  CO2 22 22  --  24  GLUCOSE 101* 109*  --  90  BUN 10 10  --  8  CREATININE 0.95 0.87  --  0.72  CALCIUM 9.2 8.9  --  8.8*  MG  --   --  1.8 2.1   Liver Function Tests: Recent Labs  Lab 12/19/18 1447 12/20/18 0112  AST 113* 106*  ALT 74* 72*  ALKPHOS 137* 130*  BILITOT 0.6 0.8  PROT 7.4 6.9  ALBUMIN 2.7* 2.4*   No results for input(s): LIPASE, AMYLASE in the last 168 hours. No results for input(s): AMMONIA in the last 168 hours. CBC: Recent Labs  Lab 12/19/18 1447 12/20/18 0112 12/21/18 0303  WBC 11.7* 11.6* 9.6  NEUTROABS 8.3*  --  6.0  HGB 10.4* 9.9* 9.5*  HCT 31.9* 30.1* 29.0*  MCV 95.8 95.0 96.0  PLT 350 330 354    Cardiac Enzymes: No results for input(s): CKTOTAL, CKMB, CKMBINDEX, TROPONINI in the last 168 hours. BNP (last 3 results) No results for input(s): BNP in the last 8760 hours.  ProBNP (last 3 results) No results for input(s): PROBNP in the last 8760 hours.  CBG: No results for input(s): GLUCAP in the last 168 hours.  Recent Results (from the past 240 hour(s))  Culture, blood (Routine x 2)     Status: None (Preliminary result)   Collection Time: 12/19/18  1:54 PM   Specimen: BLOOD  Result Value Ref Range Status   Specimen Description BLOOD SITE NOT SPECIFIED  Final   Special Requests   Final    BOTTLES DRAWN AEROBIC AND ANAEROBIC Blood Culture adequate volume   Culture   Final    NO GROWTH 2 DAYS Performed at [facility], [address], [place], [place] [address]    Report Status PENDING  Incomplete  SARS CORONAVIRUS 2 Nasal Swab Aptima Multi Swab     Status: None   Collection Time: 12/19/18 11:57 PM   Specimen: Aptima Multi Swab; Nasal Swab  Result Value Ref Range Status   SARS Coronavirus 2 NEGATIVE NEGATIVE Final  Comment: (NOTE) SARS-CoV-2 target nucleic acids are NOT DETECTED. The SARS-CoV-2 RNA is generally detectable in upper and lower respiratory specimens during the acute phase of infection. Negative results do not preclude SARS-CoV-2 infection, do not rule out co-infections with other pathogens, and should not be used as the sole basis for treatment or other patient management decisions. Negative results must be combined with clinical observations, patient history, and epidemiological information. The expected result is Negative. Fact Sheet for Patients: SugarRoll.be Fact Sheet for Healthcare Providers: https://www.woods-mathews.com/ This test is not yet approved or cleared by the [place] FDA and  has been authorized for detection and/or diagnosis of SARS-CoV-2 by FDA under an Emergency Use Authorization  (EUA). This EUA will remain  in effect (meaning this test can be used) for the duration of the COVID-19 declaration under Section 56 4(b)(1) of the Act, 21 U.S.C. section 360bbb-3(b)(1), unless the authorization is terminated or revoked sooner. Performed at [facility], [place] [address] [place], [place] [address]   Surgical pcr screen     Status: Abnormal   Collection Time: 12/20/18 12:28 AM   Specimen: Aptima Multi Swab; Nasal Swab  Result Value Ref Range Status   MRSA, PCR NEGATIVE NEGATIVE Final   Staphylococcus aureus POSITIVE (A) NEGATIVE Final    Comment: (NOTE) The Xpert SA Assay (FDA approved for NASAL specimens in patients 82 years of age and older), is one component of a comprehensive surveillance program. It is not intended to diagnose infection nor to guide or monitor treatment. Performed at [facility], [place] [address] [place], [place] [address]   Culture, blood (Routine x 2)     Status: None (Preliminary result)   Collection Time: 12/20/18  1:08 AM   Specimen: BLOOD  Result Value Ref Range Status   Specimen Description BLOOD RIGHT ANTECUBITAL  Final   Special Requests   Final    BOTTLES DRAWN AEROBIC AND ANAEROBIC Blood Culture results may not be optimal due to an inadequate volume of blood received in culture bottles   Culture   Final    NO GROWTH 1 DAY Performed at [facility], [place] [address], [place], [place] [address]    Report Status PENDING  Incomplete     Studies: Dg Chest 2 View  Result Date: 12/19/2018 CLINICAL DATA:  Fever EXAM: CHEST - 2 VIEW COMPARISON:  July 02, 2015 FINDINGS: No edema or consolidation. Heart size and pulmonary vascularity are normal. No adenopathy. No bone lesions. There is aortic atherosclerosis. IMPRESSION: No edema or consolidation. Heart size within normal limits. Aortic Atherosclerosis (ICD10-I70.0). Electronically Signed   By: Lowella Grip III M.D.   On: 12/19/2018 15:41    Scheduled Meds: .  amLODipine  5 mg Oral Daily  . aspirin EC  81 mg Oral BID  . Chlorhexidine Gluconate Cloth  6 each Topical Daily  . cholecalciferol  3,000 Units Oral Daily  . enoxaparin (LOVENOX) injection  70 mg Subcutaneous Q24H  . ferrous sulfate  650 mg Oral Q breakfast  . fluticasone  2 spray Each Nare Daily  . furosemide  40 mg Oral Daily  . Gabapentin Enacarbil  1,200 mg Oral QHS  . hydrocerin   Topical BID  . irbesartan  150 mg Oral Daily  . loratadine  10 mg Oral QHS  . montelukast  10 mg Oral QHS  . mupirocin ointment  1 application Nasal BID  . pantoprazole  40 mg Oral Daily  . PHENobarbital  129.6 mg  Oral QHS  . pramipexole  0.25 mg Oral q1800  . Pramipexole Dihydrochloride  0.75 mg Oral QHS  . rosuvastatin  20 mg Oral QHS  . spironolactone  25 mg Oral Daily   Continuous Infusions: . cefTRIAXone (ROCEPHIN)  IV Stopped (12/20/18 2221)   Time spent: 30 minutes  [facility]  If 7PM-7AM, please contact night-coverage at [email or web address], password [facility] 12/21/2018, 1:19 PM  LOS: 1 day

## 2018-12-22 ENCOUNTER — Inpatient Hospital Stay (HOSPITAL_COMMUNITY): Payer: Medicare Other

## 2018-12-22 NOTE — Plan of Care (Signed)
  Problem: Skin Integrity: Goal: Risk for impaired skin integrity will decrease Outcome: Progressing   

## 2018-12-22 NOTE — Progress Notes (Signed)
Visit made to patients room to place patient on CPAP.  Patient was awake and stated she administer CPAP when she is ready for bed.  I advised patient if she has issues throughout the night to have RN call RT.

## 2018-12-22 NOTE — Progress Notes (Signed)
After patients visit to the BR, noted LLE around knee area started to drain. Cleansed area and applied 4x4's and gauze.   Drainage white/brownish thick moderate amount noted.

## 2018-12-22 NOTE — Plan of Care (Signed)

## 2018-12-22 NOTE — Progress Notes (Signed)
[facility] PROGRESS NOTE  [name] TMH:[id number] DOB: 12-12-1950 DOA: 12/19/2018 PCP: [name] Jordan, MD  HPI: [name] is a 69 y.o. female with medical history significant for hypertension, hyperlipidemia, seizure disorder, chronic lymphedema of both legs, GERD, restless leg syndrome, and OSA on CPAP who presents to the ED for cellulitis of left lower extremity. Patient stated that about little more than 1 week ago she had itching at her left lower leg she tried to scratch with a "spaghetti spoon."  She says she was a little too enthusiastic and did break skin.  Over the next week she has noticed redness of her left leg with increased warmth to touch.  She saw her primary care [facility] physicians) on 12/16/2018 was prescribed Bactrim which she started taking same day.  Despite antibiotics she had progressive erythema which increased from below the knee to her lower thigh.  She therefore presented to the ED for further evaluation and management. She denies any subjective fevers, chills, diaphoresis, chest pain, dyspnea, cough, abdominal pain, or dysuria. In ED: Initial vitals showed BP 153/68, pulse 80, RR 18, temp 101 Fahrenheit, SPO2 94% on room air. Labs notable for WBC 11.7, hemoglobin 10.4, platelets 350,000, sodium 132, potassium 4.0, bicarb 22, creatinine 0.95, AST 113, ALT 34, alk phos 137, T bili 0.6, lactic acid 1.0.  1 of 2 blood cultures obtained and pending. 2 view chest x-ray was without focal consolidation, edema, or effusion. Patient was given IV clindamycin and Toradol and the hospitalist service was consulted to admit for further evaluation management.   Assessment/Plan: Principal Problem:   Cellulitis of left lower extremity Active Problems:   Restless legs syndrome (RLS)   Seizure disorder (HCC)   Essential hypertension   HLD (hyperlipidemia)   OSA on CPAP   Morbid obesity due to excess calories (HCC)   Hyponatremia   Cellulitis   Cellulitis of left lower  extremity in setting of chronic bilateral lower extremity lymphedema:  Rule out phlegmon versus abscess overlying left knee max temp 100.8.  mild leukocytosis 11.7 Failed outpatient treatment with oral Bactrim.   Received IV clindamycin in the ED. Switch to IV ceftriaxone 2 g daily Erythema appears to be receding from previously drawn line on 12/21/2018 CT left knee to evaluate fluid drainage location given overnight reports of somewhat purulent appearing output  Ambulatory dysfunction, multifactorial Acute on chronic, unclear baseline -Patient indicates more acutely unable to walk due to pain and swelling secondary to above -Patient indicates a year ago she would be able to ambulate a few 100 feet without difficulty however over the past few months has been becoming more weak and debilitated likely secondary to poor exercise regimen  Hypertension, essential, somewhat poorly controlled -Hhome meds include spironolactone, amlodipine, and valsartan(irbesartan equivalent in house), lasix, zaroxolyn, HCTZ. -Resume all home medications at previous home doses given poor control of blood pressure currently and ongoing edema of lower extremities -follow clinically  Hyperlipidemia:  Continue rosuvastatin.  Seizure disorder: Reportedly secondary to cerebral aneurysm status post clipping.  She states she cannot have MRI imaging because of clips in place.  Patient denies any seizures in many years. - Continue phenobarbital. -of note patient wants it noted in chart that she is unable to have MRI's due to clips  Restless leg syndrome:  Resume home Mirapex  Lymphedema:  chart review indicates referred to lymphedema clinic. Reports has not been since covid. Home meds include several diuretics.  Obesity: BMI 54.4 -nutritional consult -Lengthy discussion at bedside  about need for increased exercise tolerance and calorie restriction  OSA on CPAP: Continue CPAP nightly.  Code Status:  full Disposition Plan: Patient continues to require IV antibiotics with ongoing cellulitis and concern for deep tissue infection.  Continue to follow clinically, once patient's infection is under control consider transition to p.o. antibiotics and discharge home at that time.  Likely require additional 24 to 48 hours of close monitoring, IV antibiotics given failure of outpatient therapy previously.  Antibiotics:  Clindamycin 1 dose  Rocephin 12/19/18>> ongoing  HPI/Subjective: Reports of purulent drainage from the left knee, unclear etiology, indicates pain and erythema appear to be improving, denies chest pain, shortness breath, nausea, vomiting, diarrhea, constipation, headache, fevers, chills  Objective: Vitals:   12/21/18 1944 12/22/18 0412  BP: (!) 138/55 (!) 144/70  Pulse: 74 78  Resp: 18 19  Temp: 99.2 F (37.3 C) 98.2 F (36.8 C)  SpO2: 95% 99%    Intake/Output Summary (Last 24 hours) at 12/22/2018 0809 Last data filed at 12/22/2018 0636 Gross per 24 hour  Intake 1160 ml  Output -  Net 1160 ml   Filed Weights   12/19/18 1351 12/21/18 0420 12/22/18 0500  Weight: 135 kg 134.8 kg 133.6 kg    Exam:   General:  Obese awake no acute distress, cooperative  Cardiovascular: rrr +murmur; BLE 2+ pitting edema to the knee  Respiratory: normal effort BS distant but clear no crackles   Abdomen: obese soft +BS no guarding or rebounding  Musculoskeletal: joints without swelling/erythema  Skin: left leg skin shiny with blanching erythema and tenderness to just above the knee. Healing abrasion on shin. No drainage.  Bandage over left lateral anterior knee containing bloody somewhat cloudy fluid.  Data Reviewed: Basic Metabolic Panel: Recent Labs  Lab 12/19/18 1447 12/20/18 0112 12/20/18 0854 12/21/18 0303  NA 132* 131*  --  135  K 4.0 3.6  --  3.5  CL 98 100  --  101  CO2 22 22  --  24  GLUCOSE 101* 109*  --  90  BUN 10 10  --  8  CREATININE 0.95 0.87  --  0.72   CALCIUM 9.2 8.9  --  8.8*  MG  --   --  1.8 2.1   Liver Function Tests: Recent Labs  Lab 12/19/18 1447 12/20/18 0112  AST 113* 106*  ALT 74* 72*  ALKPHOS 137* 130*  BILITOT 0.6 0.8  PROT 7.4 6.9  ALBUMIN 2.7* 2.4*   No results for input(s): LIPASE, AMYLASE in the last 168 hours. No results for input(s): AMMONIA in the last 168 hours. CBC: Recent Labs  Lab 12/19/18 1447 12/20/18 0112 12/21/18 0303  WBC 11.7* 11.6* 9.6  NEUTROABS 8.3*  --  6.0  HGB 10.4* 9.9* 9.5*  HCT 31.9* 30.1* 29.0*  MCV 95.8 95.0 96.0  PLT 350 330 354    Recent Results (from the past 240 hour(s))  Culture, blood (Routine x 2)     Status: None (Preliminary result)   Collection Time: 12/19/18  1:54 PM   Specimen: BLOOD  Result Value Ref Range Status   Specimen Description BLOOD SITE NOT SPECIFIED  Final   Special Requests   Final    BOTTLES DRAWN AEROBIC AND ANAEROBIC Blood Culture adequate volume   Culture   Final    NO GROWTH 2 DAYS Performed at [facility], [address], [place], [place] [address]    Report Status PENDING  Incomplete  SARS CORONAVIRUS 2 Nasal  Swab Aptima Multi Swab     Status: None   Collection Time: 12/19/18 11:57 PM   Specimen: Aptima Multi Swab; Nasal Swab  Result Value Ref Range Status   SARS Coronavirus 2 NEGATIVE NEGATIVE Final    Comment: (NOTE) SARS-CoV-2 target nucleic acids are NOT DETECTED. The SARS-CoV-2 RNA is generally detectable in upper and lower respiratory specimens during the acute phase of infection. Negative results do not preclude SARS-CoV-2 infection, do not rule out co-infections with other pathogens, and should not be used as the sole basis for treatment or other patient management decisions. Negative results must be combined with clinical observations, patient history, and epidemiological information. The expected result is Negative. Fact Sheet for Patients: SugarRoll.be Fact Sheet for Healthcare  Providers: https://www.woods-mathews.com/ This test is not yet approved or cleared by the [place] FDA and  has been authorized for detection and/or diagnosis of SARS-CoV-2 by FDA under an Emergency Use Authorization (EUA). This EUA will remain  in effect (meaning this test can be used) for the duration of the COVID-19 declaration under Section 56 4(b)(1) of the Act, 21 U.S.C. section 360bbb-3(b)(1), unless the authorization is terminated or revoked sooner. Performed at [facility], [place] [address] [place], [place] [address]   Surgical pcr screen     Status: Abnormal   Collection Time: 12/20/18 12:28 AM   Specimen: Aptima Multi Swab; Nasal Swab  Result Value Ref Range Status   MRSA, PCR NEGATIVE NEGATIVE Final   Staphylococcus aureus POSITIVE (A) NEGATIVE Final    Comment: (NOTE) The Xpert SA Assay (FDA approved for NASAL specimens in patients 68 years of age and older), is one component of a comprehensive surveillance program. It is not intended to diagnose infection nor to guide or monitor treatment. Performed at [facility], [place] [address], [place], [place] [address]   Culture, blood (Routine x 2)     Status: None (Preliminary result)   Collection Time: 12/20/18  1:08 AM   Specimen: BLOOD  Result Value Ref Range Status   Specimen Description BLOOD RIGHT ANTECUBITAL  Final   Special Requests   Final    BOTTLES DRAWN AEROBIC AND ANAEROBIC Blood Culture results may not be optimal due to an inadequate volume of blood received in culture bottles   Culture   Final    NO GROWTH 1 DAY Performed at [facility], [place] [address], [place], [place] [address]    Report Status PENDING  Incomplete     Studies: No results found.  Scheduled Meds: . amLODipine  5 mg Oral Daily  . aspirin EC  81 mg Oral BID  . Chlorhexidine Gluconate Cloth  6 each Topical Daily  . cholecalciferol  3,000 Units Oral Daily  . enoxaparin (LOVENOX) injection  70 mg  Subcutaneous Q24H  . ferrous sulfate  650 mg Oral Q breakfast  . fluticasone  2 spray Each Nare Daily  . furosemide  80 mg Oral Daily  . gabapentin  600 mg Oral QHS  . hydrocerin   Topical BID  . hydrochlorothiazide  50 mg Oral Daily  . irbesartan  150 mg Oral Daily  . loratadine  10 mg Oral QHS  . metolazone  5 mg Oral Daily  . montelukast  10 mg Oral QHS  . mupirocin ointment  1 application Nasal BID  . nabumetone  750 mg Oral BID WC  . pantoprazole  40 mg Oral Daily  . PHENobarbital  129.6 mg Oral QHS  .  pramipexole  0.25 mg Oral q1800  . Pramipexole Dihydrochloride  0.75 mg Oral QHS  . rosuvastatin  20 mg Oral QHS  . spironolactone  50 mg Oral Daily   Continuous Infusions: . cefTRIAXone (ROCEPHIN)  IV Stopped (12/21/18 2312)   Time spent: 70 minutes  [facility]  If 7PM-7AM, please contact night-coverage at [email or web address], password [facility] 12/22/2018, 8:09 AM  LOS: 2 days

## 2018-12-22 NOTE — Progress Notes (Signed)
Pt states she can place CPAP machine on herself when ready for bed. Advised pt to notify for RT if any further assistance is needed.

## 2018-12-22 NOTE — Evaluation (Signed)
Physical Therapy Evaluation Patient Details Name: [name] MRN: [id number] DOB: [birth year] Today's Date: 12/22/2018   History of Present Illness  Pt is a 68 year old female admitted with L LE cellulitis. PMH of HTN, hyperlipidemia, seizure disorder, chronic lymphedema, restless leg syndrome and arthritis. Relevant PSH includes ankle fusion in 2017.  Clinical Impression    Pt admitted with problem above and deficits below. At present, Pt using RW for mobility tasks and required min G/min A. Pt with increased SOB, however, O2 sat at 93% on RA following gait. Pt reports husband can assist at discharge. Pt will benefit from skilled acute physical therapy to improve functional mobility independence and safety.    Follow Up Recommendations Home health PT;Supervision for mobility/OOB    Equipment Recommendations  None recommended by PT    Recommendations for Other Services       Precautions / Restrictions Precautions Precautions: None Restrictions Weight Bearing Restrictions: No      Mobility  Bed Mobility               General bed mobility comments: sitting EOB upon entry  Transfers Overall transfer level: Needs assistance Equipment used: Rolling walker (2 wheeled) Transfers: Sit to/from Stand Sit to Stand: Min assist;From elevated surface         General transfer comment: Pt requires lift assistance with STS with elevated bed height. Pt required cues for upright posture.  Ambulation/Gait Ambulation/Gait assistance: Min guard Gait Distance (Feet): 125 Feet Assistive device: Rolling walker (2 wheeled) Gait Pattern/deviations: Step-through pattern;Decreased step length - left;Decreased stance time - left;Decreased stride length;Antalgic Gait velocity: decreased Gait velocity interpretation: <1.8 ft/sec, indicate of risk for recurrent falls General Gait Details: Pt demonstrates SOB while ambulating. O2 sat at 93% on RA following gait training. Pt uses a step  through pattern and demonstrates decrease step length on the R and stance time on the :.  Stairs            Wheelchair Mobility    Modified Rankin (Stroke Patients Only)       Balance Overall balance assessment: Needs assistance Sitting-balance support: No upper extremity supported Sitting balance-Leahy Scale: Good     Standing balance support: Bilateral upper extremity supported Standing balance-Leahy Scale: Poor Standing balance comment: reliant on RW for steadying                             Pertinent Vitals/Pain Pain Assessment: 0-10 Pain Score: 5  Pain Location: L LE, knee to lower thigh Pain Descriptors / Indicators: Aching;Throbbing Pain Intervention(s): Limited activity within patient's tolerance;Monitored during session    [place] expects to be discharged to:: Private residence Living Arrangements: Spouse/significant other Available Help at Discharge: Family Type of Home: House Home Access: Level entry     Home Layout: Two level;Able to live on main level with bedroom/bathroom Home Equipment: Gilford Rile - 2 wheels;Cane - single point;Bedside commode;Tub bench      Prior Function Level of Independence: Independent with assistive device(s)         Comments: Pt was ambulating with SPC and RW.     Hand Dominance        Extremity/Trunk Assessment   Upper Extremity Assessment Upper Extremity Assessment: Overall WFL for tasks assessed    Lower Extremity Assessment Lower Extremity Assessment: Generalized weakness;LLE deficits/detail LLE Deficits / Details: Pain in L LE around knee and lower thigh with drainage at L knee. LLE: Unable  to fully assess due to pain    Cervical / Trunk Assessment Cervical / Trunk Assessment: Kyphotic  Communication   Communication: No difficulties  Cognition Arousal/Alertness: Awake/alert Behavior During Therapy: WFL for tasks assessed/performed Overall Cognitive Status: Within  Functional Limits for tasks assessed                                        General Comments      Exercises     Assessment/Plan    PT Assessment Patient needs continued PT services  PT Problem List Decreased strength;Decreased activity tolerance;Decreased mobility;Pain;Decreased balance;Decreased range of motion       PT Treatment Interventions Gait training;Stair training;Functional mobility training;Therapeutic activities;Therapeutic exercise;Balance training;Patient/family education    PT Goals (Current goals can be found in the Care Plan section)  Acute Rehab PT Goals Patient Stated Goal: increase distance walked PT Goal Formulation: With patient Time For Goal Achievement: 01/05/19 Potential to Achieve Goals: Good    Frequency Min 3X/week   Barriers to discharge        Co-evaluation               AM-PAC PT "6 Clicks" Mobility  Outcome Measure Help needed turning from your back to your side while in a flat bed without using bedrails?: A Little Help needed moving from lying on your back to sitting on the side of a flat bed without using bedrails?: A Little Help needed moving to and from a bed to a chair (including a wheelchair)?: A Little Help needed standing up from a chair using your arms (e.g., wheelchair or bedside chair)?: A Little Help needed to walk in hospital room?: A Little Help needed climbing 3-5 steps with a railing? : A Lot 6 Click Score: 17    End of Session Equipment Utilized During Treatment: Gait belt Activity Tolerance: Patient limited by fatigue;Patient limited by pain Patient left: in chair;with call bell/phone within reach;with family/visitor present Nurse Communication: Mobility status PT Visit Diagnosis: Muscle weakness (generalized) (M62.81);Pain Pain - Right/Left: Left Pain - part of body: Leg    Time: 1102-1120 PT Time Calculation (min) (ACUTE ONLY): 18 min   Charges:   PT Evaluation $PT Eval Low Complexity:  1 Low          Ellin GoodieMegan Valeree Leidy, SPT  Revin Corker 12/22/2018, 1:08 PM

## 2018-12-23 LAB — BASIC METABOLIC PANEL
Anion gap: 13 (ref 5–15)
BUN: 5 mg/dL — ABNORMAL LOW (ref 8–23)
CO2: 28 mmol/L (ref 22–32)
Calcium: 8.9 mg/dL (ref 8.9–10.3)
Chloride: 95 mmol/L — ABNORMAL LOW (ref 98–111)
Creatinine, Ser: 0.61 mg/dL (ref 0.44–1.00)
GFR calc Af Amer: >60 mL/min (ref >60)
GFR calc non Af Amer: >60 mL/min (ref >60)
Glucose, Bld: 93 mg/dL (ref 70–99)
Potassium: 3 mmol/L — ABNORMAL LOW (ref 3.5–5.1)
Sodium: 136 mmol/L (ref 135–145)

## 2018-12-23 LAB — CBC
HCT: 30.9 % — ABNORMAL LOW (ref 36.0–46.0)
Hemoglobin: 10.2 g/dL — ABNORMAL LOW (ref 12.0–15.0)
MCH: 31.3 pg (ref 26.0–34.0)
MCHC: 33 g/dL (ref 30.0–36.0)
MCV: 94.8 fL (ref 80.0–100.0)
Platelets: 414 10*3/uL — ABNORMAL HIGH (ref 150–400)
RBC: 3.26 MIL/uL — ABNORMAL LOW (ref 3.87–5.11)
RDW: 13.2 % (ref 11.5–15.5)
WBC: 7.2 10*3/uL (ref 4.0–10.5)
nRBC: 0 % (ref 0.0–0.2)

## 2018-12-23 NOTE — Plan of Care (Signed)

## 2018-12-23 NOTE — Progress Notes (Signed)
[facility] PROGRESS NOTE  [name] IHK:[id number] DOB: 07/07/1950 DOA: 12/19/2018 PCP: [name] Jordan, MD  HPI: [name] is a 68 y.o. female with medical history significant for hypertension, hyperlipidemia, seizure disorder, chronic lymphedema of both legs, GERD, restless leg syndrome, and OSA on CPAP who presents to the ED for cellulitis of left lower extremity. Patient stated that about little more than 1 week ago she had itching at her left lower leg she tried to scratch with a "spaghetti spoon."  She says she was a little too enthusiastic and did break skin.  Over the next week she has noticed redness of her left leg with increased warmth to touch.  She saw her primary care [facility] physicians) on 12/16/2018 was prescribed Bactrim which she started taking same day.  Despite antibiotics she had progressive erythema which increased from below the knee to her lower thigh.  She therefore presented to the ED for further evaluation and management. She denies any subjective fevers, chills, diaphoresis, chest pain, dyspnea, cough, abdominal pain, or dysuria. In ED: Initial vitals showed BP 153/68, pulse 80, RR 18, temp 101 Fahrenheit, SPO2 94% on room air. Labs notable for WBC 11.7, hemoglobin 10.4, platelets 350,000, sodium 132, potassium 4.0, bicarb 22, creatinine 0.95, AST 113, ALT 34, alk phos 137, T bili 0.6, lactic acid 1.0.  1 of 2 blood cultures obtained and pending. 2 view chest x-ray was without focal consolidation, edema, or effusion. Patient was given IV clindamycin and Toradol and the hospitalist service was consulted to admit for further evaluation management.   Assessment/Plan: Principal Problem:   Cellulitis of left lower extremity Active Problems:   Restless legs syndrome (RLS)   Seizure disorder (HCC)   Essential hypertension   HLD (hyperlipidemia)   OSA on CPAP   Morbid obesity due to excess calories (HCC)   Hyponatremia   Cellulitis  Cellulitis of left lower  extremity in setting of chronic bilateral lower extremity lymphedema:  Concurrent fluid collection/abscess overlying left knee Patient remains afebrile, leukocytosis resolved CT remarkable for 10 x 10 x 2 cm fluid collection over the left knee Surgery sidelined indicated Ortho would be a more appropriate surgical approach given its proximity to the knee -Ortho has been consulted, defer to their expertise for further evaluation and possible procedure Continue ceftriaxone 2 g daily Erythema appears to be receding from previously drawn line on 12/21/2018 Left knee continues to have purulent drainage -bandage this morning somewhat saturated  Ambulatory dysfunction, multifactorial Acute on chronic, unclear baseline -Patient indicates more acutely unable to walk due to pain and swelling secondary to above -Patient indicates a year ago she would be able to ambulate a few 100 feet without difficulty however over the past few months has been becoming more weak and debilitated likely secondary to poor exercise regimen  Hypertension, essential, somewhat poorly controlled -Home meds include spironolactone, amlodipine, and valsartan(irbesartan equivalent in house), lasix, zaroxolyn, HCTZ. -Resume all home medications at previous home doses given poor control of blood pressure currently and ongoing edema of lower extremities -follow clinically  Hyperlipidemia:  Continue rosuvastatin.  Seizure disorder: Reportedly secondary to cerebral aneurysm status post clipping.  She states she cannot have MRI imaging because of clips in place.  Patient denies any seizures in many years. - Continue phenobarbital. -of note patient wants it noted in chart that she is unable to have MRI's due to clips  Restless leg syndrome:  Resume home Mirapex  Lymphedema:  Previously referred to lymphedema clinic. Reports has  not been since covid. Home meds include several diuretics. Order compression stockings for right lower  extremity, hold for left given infection  Obesity: BMI 54.4 -nutritional consult -Lengthy discussion at bedside about need for increased exercise tolerance and calorie restriction  OSA on CPAP: Continue CPAP nightly.  Code Status: full Disposition Plan: Patient continues to require IV antibiotics with ongoing cellulitis and concern for deep tissue infection given imaging.  Possible surgical evaluation/procedure pending.  Antibiotics:  Clindamycin 1 dose  Rocephin 12/19/18>> ongoing  Subjective: Reports ongoing purulent drainage from the left knee, unclear etiology, indicates pain and erythema appear to be improving, denies chest pain, shortness breath, nausea, vomiting, diarrhea, constipation, headache, fevers, chills  Objective: Vitals:   12/22/18 1956 12/23/18 0414  BP: (!) 136/56 126/61  Pulse: 67 64  Resp: 16 16  Temp: 97.9 F (36.6 C) 98.2 F (36.8 C)  SpO2: 95% 94%    Intake/Output Summary (Last 24 hours) at 12/23/2018 0750 Last data filed at 12/23/2018 0038 Gross per 24 hour  Intake 540 ml  Output -  Net 540 ml   Filed Weights   12/21/18 0420 12/22/18 0500 12/23/18 0414  Weight: 134.8 kg 133.6 kg 132.6 kg    Exam:   General:  Obese awake no acute distress, cooperative  Cardiovascular: rrr +murmur; BLE 2+ pitting edema to the knee  Respiratory: normal effort BS distant but clear no crackles   Abdomen: obese soft +BS no guarding or rebounding  Musculoskeletal: joints without swelling/erythema  Skin: left leg skin shiny with blanching erythema and tenderness to just above the knee. Healing abrasion on shin. No drainage.  Bandage over left lateral anterior knee containing bloody somewhat cloudy fluid.  Data Reviewed: Basic Metabolic Panel: Recent Labs  Lab 12/19/18 1447 12/20/18 0112 12/20/18 0854 12/21/18 0303 12/23/18 0301  NA 132* 131*  --  135 136  K 4.0 3.6  --  3.5 3.0*  CL 98 100  --  101 95*  CO2 22 22  --  24 28  GLUCOSE 101* 109*  --   90 93  BUN 10 10  --  8 5*  CREATININE 0.95 0.87  --  0.72 0.61  CALCIUM 9.2 8.9  --  8.8* 8.9  MG  --   --  1.8 2.1  --    Liver Function Tests: Recent Labs  Lab 12/19/18 1447 12/20/18 0112  AST 113* 106*  ALT 74* 72*  ALKPHOS 137* 130*  BILITOT 0.6 0.8  PROT 7.4 6.9  ALBUMIN 2.7* 2.4*   CBC: Recent Labs  Lab 12/19/18 1447 12/20/18 0112 12/21/18 0303 12/23/18 0301  WBC 11.7* 11.6* 9.6 7.2  NEUTROABS 8.3*  --  6.0  --   HGB 10.4* 9.9* 9.5* 10.2*  HCT 31.9* 30.1* 29.0* 30.9*  MCV 95.8 95.0 96.0 94.8  PLT 350 330 354 414*    Recent Results (from the past 240 hour(s))  Culture, blood (Routine x 2)     Status: None (Preliminary result)   Collection Time: 12/19/18  1:54 PM   Specimen: BLOOD  Result Value Ref Range Status   Specimen Description BLOOD SITE NOT SPECIFIED  Final   Special Requests   Final    BOTTLES DRAWN AEROBIC AND ANAEROBIC Blood Culture adequate volume   Culture   Final    NO GROWTH 3 DAYS Performed at [facility], [place] [address] [place], [place] [address]    Report Status PENDING  Incomplete  SARS CORONAVIRUS 2 Nasal Swab Aptima  Multi Swab     Status: None   Collection Time: 12/19/18 11:57 PM   Specimen: Aptima Multi Swab; Nasal Swab  Result Value Ref Range Status   SARS Coronavirus 2 NEGATIVE NEGATIVE Final    Comment: (NOTE) SARS-CoV-2 target nucleic acids are NOT DETECTED. The SARS-CoV-2 RNA is generally detectable in upper and lower respiratory specimens during the acute phase of infection. Negative results do not preclude SARS-CoV-2 infection, do not rule out co-infections with other pathogens, and should not be used as the sole basis for treatment or other patient management decisions. Negative results must be combined with clinical observations, patient history, and epidemiological information. The expected result is Negative. Fact Sheet for Patients: SugarRoll.be Fact Sheet for Healthcare  Providers: https://www.woods-mathews.com/ This test is not yet approved or cleared by the [place] FDA and  has been authorized for detection and/or diagnosis of SARS-CoV-2 by FDA under an Emergency Use Authorization (EUA). This EUA will remain  in effect (meaning this test can be used) for the duration of the COVID-19 declaration under Section 56 4(b)(1) of the Act, 21 U.S.C. section 360bbb-3(b)(1), unless the authorization is terminated or revoked sooner. Performed at [facility], [place] [address], [place], [place] [address]   Surgical pcr screen     Status: Abnormal   Collection Time: 12/20/18 12:28 AM   Specimen: Aptima Multi Swab; Nasal Swab  Result Value Ref Range Status   MRSA, PCR NEGATIVE NEGATIVE Final   Staphylococcus aureus POSITIVE (A) NEGATIVE Final    Comment: (NOTE) The Xpert SA Assay (FDA approved for NASAL specimens in patients 42 years of age and older), is one component of a comprehensive surveillance program. It is not intended to diagnose infection nor to guide or monitor treatment. Performed at [facility], [place] [address] [place], [place] [address]   Culture, blood (Routine x 2)     Status: None (Preliminary result)   Collection Time: 12/20/18  1:08 AM   Specimen: BLOOD  Result Value Ref Range Status   Specimen Description BLOOD RIGHT ANTECUBITAL  Final   Special Requests   Final    BOTTLES DRAWN AEROBIC AND ANAEROBIC Blood Culture results may not be optimal due to an inadequate volume of blood received in culture bottles   Culture   Final    NO GROWTH 2 DAYS Performed at  Hospital Lab, [place] [address], [place],  [address]    Report Status PENDING  Incomplete     Studies: Ct Knee Left Wo Contrast  Result Date: 12/22/2018 CLINICAL DATA:  The pain and swelling with cellulitis EXAM: CT OF THE left KNEE WITHOUT CONTRAST TECHNIQUE: Multidetector CT imaging of the left knee was performed according to the standard  protocol. Multiplanar CT image reconstructions were also generated. COMPARISON:  None. FINDINGS: Bones/Joint/Cartilage No fracture or dislocation. No areas of cortical destruction or bony erosion. There is advanced tricompartmental osteoarthritis, most notable in the medial and patellofemoral compartment. There is subchondral sclerosis and marginal osteophyte formation seen. Ligaments Suboptimally assessed by CT. Muscles and Tendons There is mild fatty atrophy noted within the musculature. No areas of intramuscular fluid collection or tear is seen. The patellar and quadriceps tendon appear to be intact. However the there is attenuated appearance to the lateral aspect of the quadriceps tendon with a 8 mm heterotopic calcification which could be from a prior partial injury. Soft tissues There is diffuse prepatellar subcutaneous edema seen. A ovoid shaped loculated fluid collection extending from the anterolateral  distal femur to the proximal tibia. The collection measuring approximately 10.7 x 2.6 x10.3 cm. The collection does not extend to the deeper fascial layers. No large knee joint effusion is seen. There is overlying skin irregularity and skin thickening seen. IMPRESSION: 1. No definite evidence of osteomyelitis 2. Subcutaneous loculated fluid collection measuring 10.7 x 2.6 x 10.3 cm overlying the anterolateral aspect of the knee, likely abscess given the patient's history. 3. Overlying skin irregularity and cellulitis Electronically Signed   By: Prudencio Pair M.D.   On: 12/22/2018 20:28    Scheduled Meds: . amLODipine  5 mg Oral Daily  . aspirin EC  81 mg Oral BID  . Chlorhexidine Gluconate Cloth  6 each Topical Daily  . cholecalciferol  3,000 Units Oral Daily  . enoxaparin (LOVENOX) injection  70 mg Subcutaneous Q24H  . ferrous sulfate  650 mg Oral Q breakfast  . fluticasone  2 spray Each Nare Daily  . furosemide  80 mg Oral Daily  . gabapentin  600 mg Oral QHS  . hydrocerin   Topical BID  .  hydrochlorothiazide  50 mg Oral Daily  . irbesartan  150 mg Oral Daily  . loratadine  10 mg Oral QHS  . metolazone  5 mg Oral Daily  . montelukast  10 mg Oral QHS  . mupirocin ointment  1 application Nasal BID  . nabumetone  750 mg Oral BID WC  . pantoprazole  40 mg Oral Daily  . PHENobarbital  129.6 mg Oral QHS  . pramipexole  0.25 mg Oral q1800  . Pramipexole Dihydrochloride  0.75 mg Oral QHS  . rosuvastatin  20 mg Oral QHS  . spironolactone  50 mg Oral Daily   Continuous Infusions: . cefTRIAXone (ROCEPHIN)  IV 2 g (12/22/18 2237)   Time spent: 9 minutes  [facility]  If 7PM-7AM, please contact night-coverage at [email or web address], password [facility] 12/23/2018, 7:50 AM  LOS: 3 days

## 2018-12-23 NOTE — Plan of Care (Signed)

## 2018-12-23 NOTE — Plan of Care (Signed)

## 2018-12-23 NOTE — Progress Notes (Signed)
Left leg dressing changedx2.

## 2018-12-24 LAB — CBC
HCT: 34.6 % — ABNORMAL LOW (ref 36.0–46.0)
Hemoglobin: 11.4 g/dL — ABNORMAL LOW (ref 12.0–15.0)
MCH: 31.1 pg (ref 26.0–34.0)
MCHC: 32.9 g/dL (ref 30.0–36.0)
MCV: 94.3 fL (ref 80.0–100.0)
Platelets: 425 10*3/uL — ABNORMAL HIGH (ref 150–400)
RBC: 3.67 MIL/uL — ABNORMAL LOW (ref 3.87–5.11)
RDW: 13.2 % (ref 11.5–15.5)
WBC: 7.9 10*3/uL (ref 4.0–10.5)
nRBC: 0.3 % — ABNORMAL HIGH (ref 0.0–0.2)

## 2018-12-24 LAB — BASIC METABOLIC PANEL
Anion gap: 11 (ref 5–15)
BUN: 6 mg/dL — ABNORMAL LOW (ref 8–23)
CO2: 31 mmol/L (ref 22–32)
Calcium: 9 mg/dL (ref 8.9–10.3)
Chloride: 91 mmol/L — ABNORMAL LOW (ref 98–111)
Creatinine, Ser: 0.79 mg/dL (ref 0.44–1.00)
GFR calc Af Amer: >60 mL/min (ref >60)
GFR calc non Af Amer: >60 mL/min (ref >60)
Glucose, Bld: 101 mg/dL — ABNORMAL HIGH (ref 70–99)
Potassium: 2.8 mmol/L — ABNORMAL LOW (ref 3.5–5.1)
Sodium: 133 mmol/L — ABNORMAL LOW (ref 135–145)

## 2018-12-24 LAB — CULTURE, BLOOD (ROUTINE X 2)
Culture: NO GROWTH
Special Requests: ADEQUATE
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
## 2018-12-24 NOTE — Plan of Care (Signed)

## 2018-12-24 NOTE — Progress Notes (Signed)
Dressing changex2. Clear and serosanguineous drainage. Gauze and foam dressing.

## 2018-12-24 NOTE — Progress Notes (Signed)
RT to pt's room to assist with CPAP. Pt requesting RN check her leg dressing before she puts on her mask. RT explained to pt that machine is set to automatically turn on when it senses breath. Pt states she should be able to do home nasal pillows herself. RT will continue to monitor.

## 2018-12-24 NOTE — Plan of Care (Signed)

## 2018-12-24 NOTE — Progress Notes (Signed)
[facility] PROGRESS NOTE  [name] VEL:[id number] DOB: 1950-05-13 DOA: 12/19/2018 PCP: [name] Jordan, MD  HPI: [name] is a 68 y.o. female with medical history significant for hypertension, hyperlipidemia, seizure disorder, chronic lymphedema of both legs, GERD, restless leg syndrome, and OSA on CPAP who presents to the ED for cellulitis of left lower extremity. Patient stated that about little more than 1 week ago she had itching at her left lower leg she tried to scratch with a "spaghetti spoon."  She says she was a little too enthusiastic and did break skin.  Over the next week she has noticed redness of her left leg with increased warmth to touch.  She saw her primary care [facility] physicians) on 12/16/2018 was prescribed Bactrim which she started taking same day.  Despite antibiotics she had progressive erythema which increased from below the knee to her lower thigh.  She therefore presented to the ED for further evaluation and management. She denies any subjective fevers, chills, diaphoresis, chest pain, dyspnea, cough, abdominal pain, or dysuria. In ED: Initial vitals showed BP 153/68, pulse 80, RR 18, temp 101 Fahrenheit, SPO2 94% on room air. Labs notable for WBC 11.7, hemoglobin 10.4, platelets 350,000, sodium 132, potassium 4.0, bicarb 22, creatinine 0.95, AST 113, ALT 34, alk phos 137, T bili 0.6, lactic acid 1.0.  1 of 2 blood cultures obtained and pending. 2 view chest x-ray was without focal consolidation, edema, or effusion. Patient was given IV clindamycin and Toradol and the hospitalist service was consulted to admit for further evaluation management.   Assessment/Plan: Principal Problem:   Cellulitis of left lower extremity Active Problems:   Restless legs syndrome (RLS)   Seizure disorder (HCC)   Essential hypertension   HLD (hyperlipidemia)   OSA on CPAP   Morbid obesity due to excess calories (HCC)   Hyponatremia   Cellulitis  Cellulitis of left lower  extremity in setting of chronic bilateral lower extremity lymphedema: POA, improving Concurrent fluid collection/abscess overlying left knee POA, improving Patient remains afebrile, leukocytosis resolved CT remarkable for 10 x 10 x 2 cm fluid collection over the left knee Ortho recommending no further intervention at this time, continue antibiotics for 2 weeks, will follow in the outpatient setting. Continue ceftriaxone 2 g daily Erythema continues to regress from previously drawn line on 12/21/2018 Left knee continues to have drainage, but decreasing amount daily - bandage this morning somewhat saturated with bloody purulent material  Ambulatory dysfunction, multifactorial, improving Acute on chronic, unclear baseline -Patient indicates more acutely unable to walk due to pain and swelling secondary to above -Patient indicates a year ago she would be able to ambulate a few 100 feet without difficulty however over the past few months has been becoming more weak and debilitated likely secondary to poor exercise regimen  Hypertension, essential -Continue home meds including spironolactone, amlodipine, and valsartan(irbesartan equivalent in house), lasix, zaroxolyn, HCTZ. -Currently well controlled  Hyperlipidemia:  Continue rosuvastatin.  Seizure disorder: Reportedly secondary to cerebral aneurysm status post clipping.  She states she cannot have MRI imaging because of clips in place.  Patient denies any seizures in many years. - Continue phenobarbital. -of note patient wants it noted in chart that she is unable to have MRI's due to clips  Restless leg syndrome:  Resume home Mirapex  Lymphedema:  Previously referred to lymphedema clinic. Reports she has not been since covid. Home meds include several diuretics. Order compression stockings for right lower extremity, hold for left given infection/purulent drainage  Obesity: BMI 54.4 -nutritional consult -Lengthy discussion at bedside  about need for increased exercise tolerance and calorie restriction  OSA on CPAP: Continue CPAP nightly.  Code Status: full Disposition Plan: Patient continues to require IV antibiotics with ongoing cellulitis and concern for deep tissue infection given imaging.  Tentative disposition home in the next 24 to 48 hours pending clinical course tolerance of p.o., ambulation and cultures  Antibiotics:  Clindamycin 1 dose  Rocephin 12/19/18>> ongoing  Subjective: Reports ongoing purulent drainage from the left knee, unclear etiology, indicates pain and erythema appear to be improving, denies chest pain, shortness breath, nausea, vomiting, diarrhea, constipation, headache, fevers, chills  Objective: Vitals:   12/24/18 0410 12/24/18 0841  BP: (!) 128/93 (!) 129/57  Pulse: 66 87  Resp: 16 18  Temp: 98.2 F (36.8 C) 98 F (36.7 C)  SpO2: 92% 95%    Intake/Output Summary (Last 24 hours) at 12/24/2018 1219 Last data filed at 12/24/2018 0900 Gross per 24 hour  Intake 600 ml  Output -  Net 600 ml   Filed Weights   12/22/18 0500 12/23/18 0414 12/24/18 0500  Weight: 133.6 kg 132.6 kg 132 kg    Exam:   General:  Obese awake no acute distress, cooperative  Cardiovascular: rrr +murmur; BLE 2+ pitting edema to the knee  Respiratory: normal effort BS distant but clear no crackles   Abdomen: obese soft +BS no guarding or rebounding  Musculoskeletal: joints without swelling/erythema  Skin: left leg skin shiny with blanching erythema and tenderness to just above the knee. Healing abrasion on shin. No drainage.  Bandage over left lateral anterior knee containing bloody somewhat cloudy fluid.  Data Reviewed: Basic Metabolic Panel: Recent Labs  Lab 12/19/18 1447 12/20/18 0112 12/20/18 0854 12/21/18 0303 12/23/18 0301 12/24/18 0517  NA 132* 131*  --  135 136 133*  K 4.0 3.6  --  3.5 3.0* 2.8*  CL 98 100  --  101 95* 91*  CO2 22 22  --  _0 GLUCOSE 101* 109*  --  90 93  101*  BUN 10 10  --  8 5* 6*  CREATININE 0.95 0.87  --  0.72 0.61 0.79  CALCIUM 9.2 8.9  --  8.8* 8.9 9.0  MG  --   --  1.8 2.1  --   --    Liver Function Tests: Recent Labs  Lab 12/19/18 1447 12/20/18 0112  AST 113* 106*  ALT 74* 72*  ALKPHOS 137* 130*  BILITOT 0.6 0.8  PROT 7.4 6.9  ALBUMIN 2.7* 2.4*   CBC: Recent Labs  Lab 12/19/18 1447 12/20/18 0112 12/21/18 0303 12/23/18 0301 12/24/18 0517  WBC 11.7* 11.6* 9.6 7.2 7.9  NEUTROABS 8.3*  --  6.0  --   --   HGB 10.4* 9.9* 9.5* 10.2* 11.4*  HCT 31.9* 30.1* 29.0* 30.9* 34.6*  MCV 95.8 95.0 96.0 94.8 94.3  PLT 350 330 354 414* 425*    Recent Results (from the past 240 hour(s))  Culture, blood (Routine x 2)     Status: None   Collection Time: 12/19/18  1:54 PM   Specimen: BLOOD  Result Value Ref Range Status   Specimen Description BLOOD SITE NOT SPECIFIED  Final   Special Requests   Final    BOTTLES DRAWN AEROBIC AND ANAEROBIC Blood Culture adequate volume   Culture   Final    NO GROWTH 5 DAYS Performed at [facility], [address] [place], [place] [address]  Report Status 12/24/2018 FINAL  Final  SARS CORONAVIRUS 2 Nasal Swab Aptima Multi Swab     Status: None   Collection Time: 12/19/18 11:57 PM   Specimen: Aptima Multi Swab; Nasal Swab  Result Value Ref Range Status   SARS Coronavirus 2 NEGATIVE NEGATIVE Final    Comment: (NOTE) SARS-CoV-2 target nucleic acids are NOT DETECTED. The SARS-CoV-2 RNA is generally detectable in upper and lower respiratory specimens during the acute phase of infection. Negative results do not preclude SARS-CoV-2 infection, do not rule out co-infections with other pathogens, and should not be used as the sole basis for treatment or other patient management decisions. Negative results must be combined with clinical observations, patient history, and epidemiological information. The expected result is Negative. Fact Sheet for  Patients: SugarRoll.be Fact Sheet for Healthcare Providers: https://www.woods-mathews.com/ This test is not yet approved or cleared by the [place] FDA and  has been authorized for detection and/or diagnosis of SARS-CoV-2 by FDA under an Emergency Use Authorization (EUA). This EUA will remain  in effect (meaning this test can be used) for the duration of the COVID-19 declaration under Section 56 4(b)(1) of the Act, 21 U.S.C. section 360bbb-3(b)(1), unless the authorization is terminated or revoked sooner. Performed at [facility], [employer] [address], [place], [place] [address]   Surgical pcr screen     Status: Abnormal   Collection Time: 12/20/18 12:28 AM   Specimen: Aptima Multi Swab; Nasal Swab  Result Value Ref Range Status   MRSA, PCR NEGATIVE NEGATIVE Final   Staphylococcus aureus POSITIVE (A) NEGATIVE Final    Comment: (NOTE) The Xpert SA Assay (FDA approved for NASAL specimens in patients 54 years of age and older), is one component of a comprehensive surveillance program. It is not intended to diagnose infection nor to guide or monitor treatment. Performed at [facility], [place] [address] [place], [place] [address]   Culture, blood (Routine x 2)     Status: None (Preliminary result)   Collection Time: 12/20/18  1:08 AM   Specimen: BLOOD  Result Value Ref Range Status   Specimen Description BLOOD RIGHT ANTECUBITAL  Final   Special Requests   Final    BOTTLES DRAWN AEROBIC AND ANAEROBIC Blood Culture results may not be optimal due to an inadequate volume of blood received in culture bottles   Culture   Final    NO GROWTH 4 DAYS Performed at [facility], [place] [address], [place], [place] [address]    Report Status PENDING  Incomplete     Studies: Ct Knee Left Wo Contrast  Result Date: 12/22/2018 CLINICAL DATA:  The pain and swelling with cellulitis EXAM: CT OF THE left KNEE WITHOUT CONTRAST TECHNIQUE:  Multidetector CT imaging of the left knee was performed according to the standard protocol. Multiplanar CT image reconstructions were also generated. COMPARISON:  None. FINDINGS: Bones/Joint/Cartilage No fracture or dislocation. No areas of cortical destruction or bony erosion. There is advanced tricompartmental osteoarthritis, most notable in the medial and patellofemoral compartment. There is subchondral sclerosis and marginal osteophyte formation seen. Ligaments Suboptimally assessed by CT. Muscles and Tendons There is mild fatty atrophy noted within the musculature. No areas of intramuscular fluid collection or tear is seen. The patellar and quadriceps tendon appear to be intact. However the there is attenuated appearance to the lateral aspect of the quadriceps tendon with a 8 mm heterotopic calcification which could be from a prior partial injury. Soft tissues There is diffuse prepatellar  subcutaneous edema seen. A ovoid shaped loculated fluid collection extending from the anterolateral distal femur to the proximal tibia. The collection measuring approximately 10.7 x 2.6 x10.3 cm. The collection does not extend to the deeper fascial layers. No large knee joint effusion is seen. There is overlying skin irregularity and skin thickening seen. IMPRESSION: 1. No definite evidence of osteomyelitis 2. Subcutaneous loculated fluid collection measuring 10.7 x 2.6 x 10.3 cm overlying the anterolateral aspect of the knee, likely abscess given the patient's history. 3. Overlying skin irregularity and cellulitis Electronically Signed   By: Prudencio Pair M.D.   On: 12/22/2018 20:28    Scheduled Meds: . amLODipine  5 mg Oral Daily  . aspirin EC  81 mg Oral BID  . Chlorhexidine Gluconate Cloth  6 each Topical Daily  . cholecalciferol  3,000 Units Oral Daily  . enoxaparin (LOVENOX) injection  70 mg Subcutaneous Q24H  . ferrous sulfate  650 mg Oral Q breakfast  . fluticasone  2 spray Each Nare Daily  . furosemide  80  mg Oral Daily  . gabapentin  600 mg Oral QHS  . hydrocerin   Topical BID  . hydrochlorothiazide  50 mg Oral Daily  . irbesartan  150 mg Oral Daily  . loratadine  10 mg Oral QHS  . metolazone  5 mg Oral Daily  . montelukast  10 mg Oral QHS  . mupirocin ointment  1 application Nasal BID  . nabumetone  750 mg Oral BID WC  . pantoprazole  40 mg Oral Daily  . PHENobarbital  129.6 mg Oral QHS  . pramipexole  0.25 mg Oral q1800  . Pramipexole Dihydrochloride  0.75 mg Oral QHS  . rosuvastatin  20 mg Oral QHS  . spironolactone  50 mg Oral Daily   Continuous Infusions: . cefTRIAXone (ROCEPHIN)  IV 2 g (12/23/18 2234)   Time spent: 40 minutes  [facility]  If 7PM-7AM, please contact night-coverage at [email or web address], password [facility] 12/24/2018, 12:19 PM  LOS: 4 days

## 2018-12-25 LAB — BASIC METABOLIC PANEL
Anion gap: 14 (ref 5–15)
BUN: 13 mg/dL (ref 8–23)
CO2: 29 mmol/L (ref 22–32)
Calcium: 9 mg/dL (ref 8.9–10.3)
Chloride: 88 mmol/L — ABNORMAL LOW (ref 98–111)
Creatinine, Ser: 0.86 mg/dL (ref 0.44–1.00)
GFR calc Af Amer: >60 mL/min (ref >60)
GFR calc non Af Amer: >60 mL/min (ref >60)
Glucose, Bld: 103 mg/dL — ABNORMAL HIGH (ref 70–99)
Potassium: 2.7 mmol/L — CL (ref 3.5–5.1)
Sodium: 131 mmol/L — ABNORMAL LOW (ref 135–145)

## 2018-12-25 LAB — CBC
HCT: 35.4 % — ABNORMAL LOW (ref 36.0–46.0)
Hemoglobin: 11.6 g/dL — ABNORMAL LOW (ref 12.0–15.0)
MCH: 30.4 pg (ref 26.0–34.0)
MCHC: 32.8 g/dL (ref 30.0–36.0)
MCV: 92.9 fL (ref 80.0–100.0)
Platelets: 464 10*3/uL — ABNORMAL HIGH (ref 150–400)
RBC: 3.81 MIL/uL — ABNORMAL LOW (ref 3.87–5.11)
RDW: 13.2 % (ref 11.5–15.5)
WBC: 7.7 10*3/uL (ref 4.0–10.5)
nRBC: 0 % (ref 0.0–0.2)

## 2018-12-25 LAB — CULTURE, BLOOD (ROUTINE X 2): Culture: NO GROWTH

## 2018-12-25 LAB — MAGNESIUM: Magnesium: 1.7 mg/dL (ref 1.7–2.4)

## 2018-12-25 MED ORDER — POTASSIUM CHLORIDE 10 MEQ/100ML IV SOLN
10.0000 meq | INTRAVENOUS | Status: DC
  Administered 2018-12-25: 09:00:00 10 meq via INTRAVENOUS
  Filled 2018-12-25: qty 100

## 2018-12-25 MED ORDER — POTASSIUM CHLORIDE 20 MEQ PO PACK
40.0000 meq | PACK | ORAL | Status: AC
  Administered 2018-12-25 (×3): 40 meq via ORAL
  Filled 2018-12-25 (×3): qty 2

## 2018-12-25 MED ORDER — POTASSIUM CHLORIDE CRYS ER 10 MEQ PO TBCR
10.0000 meq | EXTENDED_RELEASE_TABLET | Freq: Every day | ORAL | Status: DC
  Administered 2018-12-25 – 2018-12-26 (×2): 10 meq via ORAL
  Filled 2018-12-25 (×2): qty 1

## 2018-12-25 NOTE — Evaluation (Signed)
Occupational Therapy Evaluation Patient Details Name: [name] MRN: [id number] DOB: 1951/01/01 Today's Date: 12/25/2018    History of Present Illness Pt is a 68 year old female admitted with L LE cellulitis. PMH of HTN, hyperlipidemia, seizure disorder, chronic lymphedema, restless leg syndrome and arthritis. Relevant PSH includes ankle fusion in 2017.   Clinical Impression   Pt PTA: living with spouse, mostly independent and performing own mobility with AD and ADL with little to no assist. Pt currently limited by pain in L LE, decreased ability to perform ADL and decreased activity tolerance. Pt performing ADL functional transfers and ADL functional mobility with RW and minguardA and heavy reliance on RW for support. Pt performing ADL tasks with set-upA for UB and min to modA for LB ADL due to cellulitis, wound dressing and edema. Pt would greatly benefit from continued OT skilled services for ADL, mobility and safety in [facility] setting. Ot following acutely.     Follow Up Recommendations  Home health OT;Supervision - Intermittent    Equipment Recommendations  None recommended by OT    Recommendations for Other Services       Precautions / Restrictions Precautions Precautions: None Restrictions Weight Bearing Restrictions: No      Mobility Bed Mobility Overal bed mobility: Needs Assistance Bed Mobility: Sidelying to Sit   Sidelying to sit: Supervision          Transfers Overall transfer level: Needs assistance Equipment used: Rolling walker (2 wheeled) Transfers: Sit to/from Stand Sit to Stand: Min guard;From elevated surface         General transfer comment: Pt using RW for power up and minguardA due to pain in LLE    Balance Overall balance assessment: Needs assistance Sitting-balance support: No upper extremity supported Sitting balance-Leahy Scale: Good     Standing balance support: Single extremity supported;During functional activity Standing  balance-Leahy Scale: Fair Standing balance comment: pericare with only 1UE supported                           ADL either performed or assessed with clinical judgement   ADL Overall ADL's : Needs assistance/impaired Eating/Feeding: Set up;Sitting   Grooming: Set up;Sitting   Upper Body Bathing: Set up;Sitting   Lower Body Bathing: Moderate assistance;Sitting/lateral leans;Sit to/from stand   Upper Body Dressing : Set up;Sitting   Lower Body Dressing: Moderate assistance;Cueing for safety;Cueing for sequencing;Sit to/from stand;Sitting/lateral leans   Toilet Transfer: Min guard;Ambulation;RW   Toileting- Clothing Manipulation and Hygiene: Minimal assistance;Sitting/lateral lean;Sit to/from stand       Functional mobility during ADLs: Min guard;Rolling walker General ADL Comments: Set-up A for UB ADL. Pt with large body habitus having trouble with LB ADL. pt reports her spouse can assist as needed. Pt received education on toilet aid as pt was having diffiulty in standing     Vision Baseline Vision/History: Wears glasses Wears Glasses: At all times Vision Assessment?: No apparent visual deficits     Perception     Praxis      Pertinent Vitals/Pain Pain Assessment: Faces Faces Pain Scale: Hurts a little bit Pain Location: L LE, knee to lower thigh Pain Descriptors / Indicators: Aching;Throbbing Pain Intervention(s): Monitored during session     Hand Dominance Right   Extremity/Trunk Assessment Upper Extremity Assessment Upper Extremity Assessment: Generalized weakness   Lower Extremity Assessment Lower Extremity Assessment: Defer to PT evaluation;LLE deficits/detail LLE Deficits / Details: drainage and bandage below knee   Cervical /  Trunk Assessment Cervical / Trunk Assessment: Kyphotic   Communication Communication Communication: No difficulties   Cognition Arousal/Alertness: Awake/alert Behavior During Therapy: WFL for tasks  assessed/performed Overall Cognitive Status: Within Functional Limits for tasks assessed                                     General Comments  Pt reports that eventually she will have stairmaster installed to be able to use her upstairs.    Exercises     Shoulder Instructions      Home Living Family/patient expects to be discharged to:: Private residence Living Arrangements: Spouse/significant other Available Help at Discharge: Family Type of Home: House Home Access: Level entry     Home Layout: Two level;Able to live on main level with bedroom/bathroom     Bathroom Shower/Tub: Tub/shower unit   Bathroom Toilet: Handicapped height     Home Equipment: Environmental consultantWalker - 2 wheels;Cane - single point;Bedside commode;Tub bench          Prior Functioning/Environment Level of Independence: Independent with assistive device(s)        Comments: Pt was ambulating with SPC and RW.        OT Problem List: Decreased strength;Decreased activity tolerance;Impaired balance (sitting and/or standing);Decreased coordination;Decreased safety awareness;Pain      OT Treatment/Interventions: Self-care/ADL training;Therapeutic exercise;Energy conservation;DME and/or AE instruction;Therapeutic activities;Patient/family education;Balance training    OT Goals(Current goals can be found in the care plan section) Acute Rehab OT Goals Patient Stated Goal: to be able to wipe myself well OT Goal Formulation: With patient Time For Goal Achievement: 01/08/19 Potential to Achieve Goals: Good ADL Goals Pt Will Perform Toileting - Clothing Manipulation and hygiene: with modified independence;with adaptive equipment;sitting/lateral leans;sit to/from stand Additional ADL Goal #1: Pt will increase to modified independence with proper AE for OOB ADL in standing with RW.  OT Frequency: Min 2X/week   Barriers to D/C:            Co-evaluation              AM-PAC OT "6 Clicks" Daily  Activity     Outcome Measure Help from another person eating meals?: None Help from another person taking care of personal grooming?: A Little Help from another person toileting, which includes using toliet, bedpan, or urinal?: A Little Help from another person bathing (including washing, rinsing, drying)?: A Little Help from another person to put on and taking off regular upper body clothing?: A Lot Help from another person to put on and taking off regular lower body clothing?: A Lot 6 Click Score: 17   End of Session Equipment Utilized During Treatment: Gait belt;Rolling walker Nurse Communication: Mobility status  Activity Tolerance: Patient tolerated treatment well Patient left: in chair;with call bell/phone within reach;with nursing/sitter in room  OT Visit Diagnosis: Unsteadiness on feet (R26.81);Muscle weakness (generalized) (M62.81);Pain Pain - Right/Left: Left Pain - part of body: Leg                Time: [id number] OT Time Calculation (min): 24 min Charges:  OT General Charges $OT Visit: 1 Visit OT Evaluation $OT Eval Moderate Complexity: 1 Mod OT Treatments $Self Care/Home Management : 8-22 mins  Revonda StandardAllison Cecil Cranker(Jelenek) [place] OTR/L Acute Rehabilitation Services Pager: [phone] Office: [phone]   Lonzo CloudLLISON J Dinh Ayotte 12/25/2018, 4:14 PM

## 2018-12-25 NOTE — Progress Notes (Signed)
Patient has unit at bedside and states she does not need any help with placing it on. RT checked water chamber. RT informed patient if she has any trouble have RN contact RT.

## 2018-12-25 NOTE — Progress Notes (Signed)
[facility] PROGRESS NOTE  [name] WEX:[id number] DOB: [birth year] DOA: 12/19/2018 PCP: [name] Jordan, MD  HPI: [name] is a 68 y.o. female with medical history significant for hypertension, hyperlipidemia, seizure disorder, chronic lymphedema of both legs, GERD, restless leg syndrome, and OSA on CPAP who presents to the ED for cellulitis of left lower extremity. Patient stated that about little more than 1 week ago she had itching at her left lower leg she tried to scratch with a "spaghetti spoon."  She says she was a little too enthusiastic and did break skin.  Over the next week she has noticed redness of her left leg with increased warmth to touch.  She saw her primary care [facility] physicians) on 12/16/2018 was prescribed Bactrim which she started taking same day.  Despite antibiotics she had progressive erythema which increased from below the knee to her lower thigh.  She therefore presented to the ED for further evaluation and management. She denies any subjective fevers, chills, diaphoresis, chest pain, dyspnea, cough, abdominal pain, or dysuria. In ED: Initial vitals showed BP 153/68, pulse 80, RR 18, temp 101 Fahrenheit, SPO2 94% on room air. Labs notable for WBC 11.7, hemoglobin 10.4, platelets 350,000, sodium 132, potassium 4.0, bicarb 22, creatinine 0.95, AST 113, ALT 34, alk phos 137, T bili 0.6, lactic acid 1.0.  1 of 2 blood cultures obtained and pending. 2 view chest x-ray was without focal consolidation, edema, or effusion. Patient was given IV clindamycin and Toradol and the hospitalist service was consulted to admit for further evaluation management.   Assessment/Plan: Principal Problem:   Cellulitis of left lower extremity Active Problems:   Restless legs syndrome (RLS)   Seizure disorder (HCC)   Essential hypertension   HLD (hyperlipidemia)   OSA on CPAP   Morbid obesity due to excess calories (HCC)   Hyponatremia   Cellulitis   Cellulitis of left lower  extremity in setting of chronic bilateral lower extremity lymphedema: POA, improving Concurrent fluid collection/abscess overlying left knee POA, improving Patient remains afebrile, leukocytosis resolved CT remarkable for 10 x 10 x 2 cm fluid collection over the left knee Ortho recommending no further intervention at this time, continue antibiotics for 2 weeks, will follow in the outpatient setting. Continue ceftriaxone 2 g daily Erythema continues to regress from previously drawn line on 12/21/2018 Left knee continues to have drainage, but decreasing amount daily - bandage this morning much less saturated than previously  Hypokalemia, severe  -In the setting of diuresis -Replete potassium as this area, resume home potassium but this seems to be inadequate -Pending repeat labs likely transition patient's home potassium to a higher dose  Ambulatory dysfunction, multifactorial, improving Acute on chronic, unclear baseline -Patient indicates more acutely unable to walk due to pain and swelling secondary to above -Patient indicates a year ago she would be able to ambulate a few 100 feet without difficulty however over the past few months has been becoming more weak and debilitated likely secondary to poor exercise regimen  Hypertension, essential -Continue home meds including spironolactone, amlodipine, and valsartan(irbesartan equivalent in house), lasix, zaroxolyn, HCTZ. -Currently well controlled  Hyperlipidemia:  -Continue rosuvastatin.  Seizure disorder: -Reportedly secondary to cerebral aneurysm status post clipping.  She states she cannot have MRI imaging because of clips in place.  Patient denies any seizures in many years. - Continue phenobarbital. -of note patient wants it noted in chart that she is unable to have MRI's due to clips  Restless leg syndrome:  -Resume  home Mirapex  Lymphedema:  -Previously referred to lymphedema clinic. Reports she has not been since covid.  Home meds include several diuretics. -Order compression stockings for right lower extremity, hold for left given infection/purulent drainage  Obesity, morbid: BMI 54.4 -nutritional consult -Lengthy discussion at bedside about need for increased exercise tolerance and calorie restriction  OSA on CPAP: Continue CPAP nightly.  Code Status: full Disposition Plan: Tentative disposition home in the next 24 to 48 hours pending clinical course tolerance of p.o., ambulation and cultures  Antibiotics:  Clindamycin 1 dose  Rocephin 12/19/18>>8/16  Augmentin 8/16>>Ongoing  Subjective: Reports ongoing purulent drainage from the left knee, unclear etiology, indicates pain and erythema appear to be improving, denies chest pain, shortness breath, nausea, vomiting, diarrhea, constipation, headache, fevers, chills  Objective: Vitals:   12/24/18 2045 12/25/18 0517  BP:  137/63  Pulse: 78 70  Resp: 16 16  Temp:  97.9 F (36.6 C)  SpO2: 95% 91%    Intake/Output Summary (Last 24 hours) at 12/25/2018 0819 Last data filed at 12/25/2018 0617 Gross per 24 hour  Intake 1260 ml  Output -  Net 1260 ml   Filed Weights   12/22/18 0500 12/23/18 0414 12/24/18 0500  Weight: 133.6 kg 132.6 kg 132 kg    Exam:   General:  Obese awake no acute distress, cooperative  Cardiovascular: rrr +murmur; BLE 2+ pitting edema to the knee  Respiratory: normal effort BS distant but clear no crackles   Abdomen: obese soft +BS no guarding or rebounding  Musculoskeletal: joints without swelling/erythema  Skin: left leg with minimal blanching erythema mid left lower leg anteriorly; tenderness minimal.  Bandage over left lateral anterior knee clean, dry, intact.  Data Reviewed: Basic Metabolic Panel: Recent Labs  Lab 12/20/18 0112 12/20/18 0854 12/21/18 0303 12/23/18 0301 12/24/18 0517 12/25/18 0511  NA 131*  --  135 136 133* 131*  K 3.6  --  3.5 3.0* 2.8* 2.7*  CL 100  --  101 95* 91* 88*  CO2 22   --  _0 GLUCOSE 109*  --  90 93 101* 103*  BUN 10  --  8 5* 6* 13  CREATININE 0.87  --  0.72 0.61 0.79 0.86  CALCIUM 8.9  --  8.8* 8.9 9.0 9.0  MG  --  1.8 2.1  --   --   --    Liver Function Tests: Recent Labs  Lab 12/19/18 1447 12/20/18 0112  AST 113* 106*  ALT 74* 72*  ALKPHOS 137* 130*  BILITOT 0.6 0.8  PROT 7.4 6.9  ALBUMIN 2.7* 2.4*   CBC: Recent Labs  Lab 12/19/18 1447 12/20/18 0112 12/21/18 0303 12/23/18 0301 12/24/18 0517 12/25/18 0511  WBC 11.7* 11.6* 9.6 7.2 7.9 7.7  NEUTROABS 8.3*  --  6.0  --   --   --   HGB 10.4* 9.9* 9.5* 10.2* 11.4* 11.6*  HCT 31.9* 30.1* 29.0* 30.9* 34.6* 35.4*  MCV 95.8 95.0 96.0 94.8 94.3 92.9  PLT 350 330 354 414* 425* 464*    Recent Results (from the past 240 hour(s))  Culture, blood (Routine x 2)     Status: None   Collection Time: 12/19/18  1:54 PM   Specimen: BLOOD  Result Value Ref Range Status   Specimen Description BLOOD SITE NOT SPECIFIED  Final   Special Requests   Final    BOTTLES DRAWN AEROBIC AND ANAEROBIC Blood Culture adequate volume   Culture   Final  NO GROWTH 5 DAYS Performed at [facility], [place] [address], [place], [place] [address]    Report Status 12/24/2018 FINAL  Final  SARS CORONAVIRUS 2 Nasal Swab Aptima Multi Swab     Status: None   Collection Time: 12/19/18 11:57 PM   Specimen: Aptima Multi Swab; Nasal Swab  Result Value Ref Range Status   SARS Coronavirus 2 NEGATIVE NEGATIVE Final    Comment: (NOTE) SARS-CoV-2 target nucleic acids are NOT DETECTED. The SARS-CoV-2 RNA is generally detectable in upper and lower respiratory specimens during the acute phase of infection. Negative results do not preclude SARS-CoV-2 infection, do not rule out co-infections with other pathogens, and should not be used as the sole basis for treatment or other patient management decisions. Negative results must be combined with clinical observations, patient history, and epidemiological information.  The expected result is Negative. Fact Sheet for Patients: SugarRoll.be Fact Sheet for Healthcare Providers: https://www.woods-mathews.com/ This test is not yet approved or cleared by the [place] FDA and  has been authorized for detection and/or diagnosis of SARS-CoV-2 by FDA under an Emergency Use Authorization (EUA). This EUA will remain  in effect (meaning this test can be used) for the duration of the COVID-19 declaration under Section 56 4(b)(1) of the Act, 21 U.S.C. section 360bbb-3(b)(1), unless the authorization is terminated or revoked sooner. Performed at [facility], [place] [address] [place], [place] [address]   Surgical pcr screen     Status: Abnormal   Collection Time: 12/20/18 12:28 AM   Specimen: Aptima Multi Swab; Nasal Swab  Result Value Ref Range Status   MRSA, PCR NEGATIVE NEGATIVE Final   Staphylococcus aureus POSITIVE (A) NEGATIVE Final    Comment: (NOTE) The Xpert SA Assay (FDA approved for NASAL specimens in patients 52 years of age and older), is one component of a comprehensive surveillance program. It is not intended to diagnose infection nor to guide or monitor treatment. Performed at [facility], [place] [address], [place], [place] [address]   Culture, blood (Routine x 2)     Status: None (Preliminary result)   Collection Time: 12/20/18  1:08 AM   Specimen: BLOOD  Result Value Ref Range Status   Specimen Description BLOOD RIGHT ANTECUBITAL  Final   Special Requests   Final    BOTTLES DRAWN AEROBIC AND ANAEROBIC Blood Culture results may not be optimal due to an inadequate volume of blood received in culture bottles   Culture   Final    NO GROWTH 4 DAYS Performed at [facility], [place] [address], [place], [place] [address]    Report Status PENDING  Incomplete     Studies: No results found.  Scheduled Meds: . amLODipine  5 mg Oral Daily  . aspirin EC  81 mg Oral BID  .  Chlorhexidine Gluconate Cloth  6 each Topical Daily  . cholecalciferol  3,000 Units Oral Daily  . enoxaparin (LOVENOX) injection  70 mg Subcutaneous Q24H  . ferrous sulfate  650 mg Oral Q breakfast  . fluticasone  2 spray Each Nare Daily  . furosemide  80 mg Oral Daily  . gabapentin  600 mg Oral QHS  . hydrocerin   Topical BID  . hydrochlorothiazide  50 mg Oral Daily  . irbesartan  150 mg Oral Daily  . loratadine  10 mg Oral QHS  . metolazone  5 mg Oral Daily  . montelukast  10 mg Oral QHS  . mupirocin ointment  1 application Nasal BID  . nabumetone  750 mg Oral BID WC  . pantoprazole  40 mg Oral Daily  . PHENobarbital  129.6 mg Oral QHS  . potassium chloride  10 mEq Oral Daily  . pramipexole  0.25 mg Oral q1800  . Pramipexole Dihydrochloride  0.75 mg Oral QHS  . rosuvastatin  20 mg Oral QHS  . spironolactone  50 mg Oral Daily   Continuous Infusions: . cefTRIAXone (ROCEPHIN)  IV 2 g (12/24/18 2254)  . potassium chloride     Time spent: 35 minutes  [facility]  If 7PM-7AM, please contact night-coverage at [email or web address], password [facility] 12/25/2018, 8:19 AM  LOS: 5 days

## 2018-12-25 NOTE — Plan of Care (Signed)

## 2018-12-26 LAB — BASIC METABOLIC PANEL
Anion gap: 13 (ref 5–15)
BUN: 18 mg/dL (ref 8–23)
CO2: 26 mmol/L (ref 22–32)
Calcium: 9.2 mg/dL (ref 8.9–10.3)
Chloride: 91 mmol/L — ABNORMAL LOW (ref 98–111)
Creatinine, Ser: 0.95 mg/dL (ref 0.44–1.00)
GFR calc Af Amer: >60 mL/min (ref >60)
GFR calc non Af Amer: >60 mL/min (ref >60)
Glucose, Bld: 114 mg/dL — ABNORMAL HIGH (ref 70–99)
Potassium: 3.9 mmol/L (ref 3.5–5.1)
Sodium: 130 mmol/L — ABNORMAL LOW (ref 135–145)

## 2018-12-26 LAB — CBC
HCT: 35.6 % — ABNORMAL LOW (ref 36.0–46.0)
Hemoglobin: 11.8 g/dL — ABNORMAL LOW (ref 12.0–15.0)
MCH: 30.8 pg (ref 26.0–34.0)
MCHC: 33.1 g/dL (ref 30.0–36.0)
MCV: 93 fL (ref 80.0–100.0)
Platelets: 474 10*3/uL — ABNORMAL HIGH (ref 150–400)
RBC: 3.83 MIL/uL — ABNORMAL LOW (ref 3.87–5.11)
RDW: 13.2 % (ref 11.5–15.5)
WBC: 8.8 10*3/uL (ref 4.0–10.5)
nRBC: 0 % (ref 0.0–0.2)

## 2018-12-26 MED ORDER — CLINDAMYCIN HCL 300 MG PO CAPS: 300.0000 mg | ORAL_CAPSULE | Freq: Three times a day (TID) | ORAL | 0 refills | Status: AC

## 2018-12-26 MED ORDER — FLUCONAZOLE 150 MG PO TABS
150.0000 mg | ORAL_TABLET | Freq: Once | ORAL | Status: AC
  Administered 2018-12-26: 150 mg via ORAL
  Filled 2018-12-26: qty 1

## 2018-12-26 NOTE — Care Management Important Message (Signed)
Important Message  Patient Details  Name: [name] MRN: [id number] Date of Birth: 1950-09-02   Medicare Important Message Given:  Yes     Orbie Pyo 12/26/2018, 2:56 PM

## 2018-12-26 NOTE — Progress Notes (Signed)
Physical Therapy Treatment Patient Details Name: [name] MRN: [id number] DOB: May 07, 1951 Today's Date: 12/26/2018    History of Present Illness Pt is a 68 year old female admitted with L LE cellulitis. PMH of HTN, hyperlipidemia, seizure disorder, chronic lymphedema, restless leg syndrome and arthritis. Relevant PSH includes ankle fusion in 2017.    PT Comments    Pt performed gt training and functional mobility during session.  Pt remains to present with flexed posture and slow waddling gt.  Progressed to stair training in which she required min assistance.  Pt is slow and guarded.  She reports she will stay downstairs until strength improves.  Continue to recommend HHPT.    Follow Up Recommendations  Home health PT;Supervision for mobility/OOB     Equipment Recommendations  None recommended by PT    Recommendations for Other Services       Precautions / Restrictions Precautions Precautions: None Restrictions Weight Bearing Restrictions: No    Mobility  Bed Mobility Overal bed mobility: Modified Independent Bed Mobility: Supine to Sit              Transfers Overall transfer level: Needs assistance Equipment used: Rolling walker (2 wheeled) Transfers: Sit to/from Stand Sit to Stand: Supervision         General transfer comment: Cues to back entirely to seated surface.  Ambulation/Gait Ambulation/Gait assistance: Min guard Gait Distance (Feet): 120 Feet(+ 7060ft) Assistive device: Rolling walker (2 wheeled) Gait Pattern/deviations: Step-through pattern;Decreased step length - left;Decreased stance time - left;Decreased stride length;Antalgic Gait velocity: decreased   General Gait Details: Pt required seated rest break after 120 ft of ambulation.  Pt required cues for safety to stay close to RW.   Stairs Stairs: Yes Stairs assistance: Min assist Stair Management: Two rails;Forwards Number of Stairs: 5 General stair comments: Cues for sequencing  and safety.   Wheelchair Mobility    Modified Rankin (Stroke Patients Only)       Balance Overall balance assessment: Needs assistance Sitting-balance support: No upper extremity supported Sitting balance-Leahy Scale: Good     Standing balance support: Single extremity supported;During functional activity Standing balance-Leahy Scale: Fair                              Cognition Arousal/Alertness: Awake/alert Behavior During Therapy: WFL for tasks assessed/performed Overall Cognitive Status: Within Functional Limits for tasks assessed                                        Exercises      General Comments        Pertinent Vitals/Pain Pain Assessment: Faces Pain Score: 5  Faces Pain Scale: Hurts a little bit Pain Location: L LE, knee to lower thigh Pain Descriptors / Indicators: Aching;Throbbing Pain Intervention(s): Monitored during session;Repositioned    Home Living                      Prior Function            PT Goals (current goals can now be found in the care plan section) Acute Rehab PT Goals Patient Stated Goal: To go home Potential to Achieve Goals: Good Progress towards PT goals: Progressing toward goals    Frequency    Min 3X/week      PT Plan Current plan remains appropriate  Co-evaluation              AM-PAC PT "6 Clicks" Mobility   Outcome Measure  Help needed turning from your back to your side while in a flat bed without using bedrails?: A Little Help needed moving from lying on your back to sitting on the side of a flat bed without using bedrails?: A Little Help needed moving to and from a bed to a chair (including a wheelchair)?: A Little Help needed standing up from a chair using your arms (e.g., wheelchair or bedside chair)?: A Little Help needed to walk in hospital room?: A Little Help needed climbing 3-5 steps with a railing? : A Lot 6 Click Score: 17    End of Session  Equipment Utilized During Treatment: Gait belt Activity Tolerance: Patient limited by fatigue;Patient limited by pain Patient left: in chair;with call bell/phone within reach;with family/visitor present Nurse Communication: Mobility status PT Visit Diagnosis: Muscle weakness (generalized) (M62.81);Pain Pain - Right/Left: Left Pain - part of body: Leg     Time: 7564-3329 PT Time Calculation (min) (ACUTE ONLY): 18 min  Charges:  $Gait Training: 8-22 mins                     Governor Rooks, PTA Acute Rehabilitation Services Pager [phone] Office [phone]     Verl Whitmore Eli Hose 12/26/2018, 11:34 AM

## 2018-12-26 NOTE — Progress Notes (Signed)
Patient discharging home. Discharge instructions explained to patient and she verbalized understanding. Took all her personal belongings. No further questions or concerns voiced.  

## 2018-12-26 NOTE — TOC Transition Note (Signed)
Transition of Care [facility]) - CM/SW Discharge Note   Patient Details  Name: BOOTS MCGLOWN MRN: [id number] Date of Birth: December 22, 1950  Transition of Care [facility]) CM/SW Contact:  Alberteen Sam, LCSW Phone Number: 12/26/2018, 10:42 AM   Clinical Narrative:     Patient set up with [facility] for Home Health PT, patient agreeable. CSW confirmed address in chart is correct and best number to be reached at for [facility] is patient's home number at [phone]. Patient reports spouse is picking up for discharge. No further discharge needs identified.   Final next level of care: Home w Home Health Services Barriers to Discharge: No Barriers Identified   Patient Goals and CMS Choice   CMS Medicare.gov Compare Post Acute Care list provided to:: Patient Choice offered to / list presented to : Patient  Discharge Placement                Patient to be transferred to facility by: home via spouse      Discharge Plan and Services                          HH Arranged: PT [facility] Agency: [facility] Date [place]: 12/26/18 Time HH Agency Contacted: 1000 Representative spoke with at [place]: Cassie  Social Determinants of Health ([place]) Interventions     Readmission Risk Interventions No flowsheet data found.

## 2018-12-26 NOTE — Plan of Care (Signed)
  Problem: Education: Goal: Knowledge of General Education information will improve Description: Including pain rating scale, medication(s)/side effects and non-pharmacologic comfort measures Outcome: Progressing   Problem: Health Behavior/Discharge Planning: Goal: Ability to manage health-related needs will improve Outcome: Progressing   Problem: Clinical Measurements: Goal: Ability to maintain clinical measurements within normal limits will improve Outcome: Progressing Goal: Will remain free from infection Outcome: Progressing Goal: Diagnostic test results will improve Outcome: Progressing   Problem: Activity: Goal: Risk for activity intolerance will decrease Outcome: Progressing   Problem: Elimination: Goal: Will not experience complications related to bowel motility Outcome: Progressing Goal: Will not experience complications related to urinary retention Outcome: Progressing   Problem: Pain Managment: Goal: General experience of comfort will improve Outcome: Progressing   Problem: Safety: Goal: Ability to remain free from injury will improve Outcome: Progressing   Problem: Skin Integrity: Goal: Risk for impaired skin integrity will decrease Outcome: Progressing   

## 2018-12-26 NOTE — Discharge Summary (Addendum)
Physician Discharge Summary  Sierah H Nickell MRN:[id number] DOB: 02/09/1951 DOA: 12/19/2018  PCP: Wolters, Sharon, MD  Admit date: 12/19/2018 Discharge date: 12/26/2018  Admitted From: Home Disposition: Home  Recommendations for Outpatient Follow-up:  1. Follow up with PCP in 1-2 weeks; orthopedic surgery in 1 week 2. Please obtain BMP/CBC in one week to follow potassium levels  Home Health: Yes Equipment/Devices: None  Discharge Condition: Stable CODE STATUS: Full Diet recommendation: Cardiac prudent, salt restricted low-fat diet  Brief/Interim Summary: Nataley H Ahnis a 67 y.o.femalewith medical history significant forhypertension, hyperlipidemia, seizure disorder, chronic lymphedema of both legs, GERD, restless leg syndrome, and OSA on CPAP who presents to the ED forcellulitis of left lower extremity. Patient stated that about little more than 1 week ago she had itching at her left lower leg she tried to scratch with a "spaghetti spoon." She says she was a little tooenthusiastic and did break skin. Over the next week she has noticed redness of her left leg with increased warmth to touch. She saw her primary care ([facility]) on 8/7/2020was prescribed Bactrim which she started taking same day. Despite antibiotics she had progressive erythema which increased from below the knee to her lower thigh. She therefore presented to the ED for further evaluation and management. She denies any subjective fevers, chills, diaphoresis, chest pain, dyspnea, cough, abdominal pain, or dysuria. In ED: Initial vitals showed BP 153/68, pulse 80, RR 18, temp 101 Fahrenheit, SPO2 94% on room air. Labs notable for WBC 11.7, hemoglobin 10.4, platelets 350,000, sodium 132, potassium 4.0, bicarb 22, creatinine 0.95, AST 113, ALT 34, alk phos 137, T bili 0.6, lactic acid 1.0.1 of 2 blood cultures obtained and pending. 2 view chest x-ray was without focal consolidation, edema, or effusion. Patient was  given IV clindamycin and Toradol and the hospitalist service was consulted to admit for further evaluation management.  Patient admitted as above with left lower extremity cellulitis having failed outpatient treatment with Bactrim.  Patient's erythema was ongoing minimally improving but not as expected on IV antibiotics, subsequent CT showed abscess/fluid collection around the knee, orthopedics was consulted, appreciate insight and recommendations.  Given patient's wound was draining on its own with improving erythema cellulitis and pain no procedure was warranted.  Patient's erythema is resolving, tolerating p.o. well, has been transitioned to p.o. antibiotics as below with clindamycin until erythema resolves and wound drainage has subsided.  Patient has repeat follow-up with orthopedics to ensure resolution of fluid collection.  During patient's hospital stay patient became somewhat hypokalemic after reinitiation of home diuretics, now resolved, will need repeat BMP with PCP within 1 week of discharge to ensure resolution of hypokalemia as well.  Patient otherwise stable and agreeable for discharge home with home health as above.  Discharge Diagnoses:  Principal Problem:   Cellulitis of left lower extremity Active Problems:   Restless legs syndrome (RLS)   Seizure disorder (HCC)   Essential hypertension   HLD (hyperlipidemia)   OSA on CPAP   Morbid obesity due to excess calories (HCC)   Hyponatremia   Cellulitis  Discharge Instructions   Allergies as of 12/26/2018      Reactions   Compazine Shortness Of Breath   Prochlorperazine Maleate Shortness Of Breath   Topamax [topiramate] Hives, Rash   Codeine Sulfate Nausea Only   Seasonal Ic [cholestatin] Hives   Adhesive [tape] Rash   Iodinated Diagnostic Agents Rash   Uncoded Allergy. Allergen: contrast dyes      Medication List    STOP   taking these medications   sulfamethoxazole-trimethoprim 800-160 MG tablet Commonly known as:  BACTRIM DS     TAKE these medications   acetaminophen 500 MG tablet Commonly known as: TYLENOL Take 2,000 mg by mouth daily as needed for moderate pain or headache.   albuterol 108 (90 Base) MCG/ACT inhaler Commonly known as: VENTOLIN HFA Inhale 2 puffs into the lungs every 4 (four) hours as needed for wheezing.   amLODipine 5 MG tablet Commonly known as: NORVASC TAKE 1 TABLET BY MOUTH EVERY DAY   aspirin EC 81 MG tablet Take 1 tablet (81 mg total) by mouth 2 (two) times daily.   Biotin 5000 MCG Caps Take 5,000 mcg by mouth 2 (two) times daily.   CALCIUM 600+D PO Take 1 tablet by mouth daily.   cholecalciferol 1000 units tablet Commonly known as: VITAMIN D Take 3,000 Units by mouth daily.   clindamycin 300 MG capsule Commonly known as: CLEOCIN Take 1 capsule (300 mg total) by mouth 3 (three) times daily for 10 days.   doxycycline 40 MG capsule Commonly known as: ORACEA Take 40 mg by mouth every morning. For rosacea   ferrous sulfate 325 (65 FE) MG tablet Take 650 mg by mouth daily with breakfast.   fluticasone 50 MCG/ACT nasal spray Commonly known as: FLONASE Place 2 sprays into the nose 2 (two) times daily.   furosemide 40 MG tablet Commonly known as: LASIX Take 2 tablets (80 mg total) by mouth daily.   Horizant 600 MG Tbcr Generic drug: Gabapentin Enacarbil TAKE 1 AND 1/2 TABLETS EVERY DAY What changed: See the new instructions.   hydrochlorothiazide 50 MG tablet Commonly known as: HYDRODIURIL Please specify directions, refills and quantity   hydrocortisone 1 % ointment Apply 1 application topically 2 (two) times daily as needed for itching (rash on hands).   Klor-Con M10 10 MEQ tablet Generic drug: potassium chloride TAKE 4 TABLETS (40 MEQ TOTAL) BY MOUTH 2 (TWO) TIMES DAILY. What changed: See the new instructions.   levocetirizine 5 MG tablet Commonly known as: XYZAL Take 5 mg by mouth at bedtime.   metolazone 5 MG tablet Commonly known as:  ZAROXOLYN Take 5 mg by mouth daily.   montelukast 10 MG tablet Commonly known as: SINGULAIR Take 10 mg by mouth at bedtime.   nabumetone 750 MG tablet Commonly known as: RELAFEN Take 750 mg by mouth 2 (two) times daily with a meal.   pantoprazole 40 MG tablet Commonly known as: PROTONIX Take 40 mg by mouth at bedtime.   PHENobarbital 64.8 MG tablet Commonly known as: LUMINAL TAKE 2 TABLETS (129.6 MG TOTAL) BY MOUTH AT BEDTIME.   Pramipexole Dihydrochloride 0.75 MG Tb24 Take 1 tablet (0.75 mg total) by mouth at bedtime. What changed: additional instructions   pramipexole 0.25 MG tablet Commonly known as: MIRAPEX TAKE 1 TABLET (0.25 MG TOTAL) BY MOUTH DAILY AFTER SUPPER. What changed:   when to take this  additional instructions   PRESCRIPTION MEDICATION Inhale into the lungs at bedtime. CPAP   rosuvastatin 20 MG tablet Commonly known as: CRESTOR Take 1 tablet (20 mg total) by mouth daily. NEEDS APPOINTMENT FOR FUTURE REFILLS What changed: when to take this   spironolactone 25 MG tablet Commonly known as: ALDACTONE Take 25 mg by mouth daily.   spironolactone 50 MG tablet Commonly known as: ALDACTONE Take 1 tablet (50 mg total) daily by mouth.   traMADol 50 MG tablet Commonly known as: ULTRAM Take 50 mg by mouth every 6 (six) hours as  needed (pain).   valsartan 160 MG tablet Commonly known as: DIOVAN TAKE 1 TABLET (160 MG TOTAL) DAILY BY MOUTH.   VITAMIN A PO Take 1 capsule by mouth daily.   zinc gluconate 50 MG tablet Take 50 mg by mouth daily.       Allergies  Allergen Reactions  . Compazine Shortness Of Breath  . Prochlorperazine Maleate Shortness Of Breath  . Topamax [Topiramate] Hives and Rash  . Codeine Sulfate Nausea Only  . Seasonal Ic [Cholestatin] Hives  . Adhesive [Tape] Rash  . Iodinated Diagnostic Agents Rash    Uncoded Allergy. Allergen: contrast dyes    Consultations:  Orthopedics   Procedures/Studies: Dg Chest 2  View  Result Date: 12/19/2018 CLINICAL DATA:  Fever EXAM: CHEST - 2 VIEW COMPARISON:  July 02, 2015 FINDINGS: No edema or consolidation. Heart size and pulmonary vascularity are normal. No adenopathy. No bone lesions. There is aortic atherosclerosis. IMPRESSION: No edema or consolidation. Heart size within normal limits. Aortic Atherosclerosis (ICD10-I70.0). Electronically Signed   By: Lowella Grip III M.D.   On: 12/19/2018 15:41   Ct Knee Left Wo Contrast  Result Date: 12/22/2018 CLINICAL DATA:  The pain and swelling with cellulitis EXAM: CT OF THE left KNEE WITHOUT CONTRAST TECHNIQUE: Multidetector CT imaging of the left knee was performed according to the standard protocol. Multiplanar CT image reconstructions were also generated. COMPARISON:  None. FINDINGS: Bones/Joint/Cartilage No fracture or dislocation. No areas of cortical destruction or bony erosion. There is advanced tricompartmental osteoarthritis, most notable in the medial and patellofemoral compartment. There is subchondral sclerosis and marginal osteophyte formation seen. Ligaments Suboptimally assessed by CT. Muscles and Tendons There is mild fatty atrophy noted within the musculature. No areas of intramuscular fluid collection or tear is seen. The patellar and quadriceps tendon appear to be intact. However the there is attenuated appearance to the lateral aspect of the quadriceps tendon with a 8 mm heterotopic calcification which could be from a prior partial injury. Soft tissues There is diffuse prepatellar subcutaneous edema seen. A ovoid shaped loculated fluid collection extending from the anterolateral distal femur to the proximal tibia. The collection measuring approximately 10.7 x 2.6 x10.3 cm. The collection does not extend to the deeper fascial layers. No large knee joint effusion is seen. There is overlying skin irregularity and skin thickening seen. IMPRESSION: 1. No definite evidence of osteomyelitis 2. Subcutaneous  loculated fluid collection measuring 10.7 x 2.6 x 10.3 cm overlying the anterolateral aspect of the knee, likely abscess given the patient's history. 3. Overlying skin irregularity and cellulitis Electronically Signed   By: Prudencio Pair M.D.   On: 12/22/2018 20:28    Subjective: No acute issues or events overnight, tolerating p.o. well, ambulating without moderate difficulty or pain.  Denies chest pain, shortness of breath, nausea, vomiting, diarrhea, constipation, headache, fevers, chills.   Discharge Exam: Vitals:   12/25/18 1937 12/26/18 0446  BP: 132/81 131/67  Pulse: 81 73  Resp: 14 16  Temp: 98.4 F (36.9 C) 98 F (36.7 C)  SpO2: 97% 92%   Vitals:   12/25/18 0853 12/25/18 1937 12/26/18 0446 12/26/18 0500  BP: (!) 113/59 132/81 131/67   Pulse: 75 81 73   Resp: _0 Temp: 98.1 F (36.7 C) 98.4 F (36.9 C) 98 F (36.7 C)   TempSrc:  Oral Oral   SpO2: 93% 97% 92%   Weight:    127.7 kg  Height:         General:  Obese awake no acute distress, cooperative  Cardiovascular: rrr +murmur; BLE 2+ pitting edema to the knee  Respiratory: normal effort BS distant but clear no crackles   Abdomen: obese soft +BS no guarding or rebounding  Musculoskeletal: joints without swelling/erythema, strength 5 out of 5 globally  Skin: left leg with minimal blanching erythema mid left lower leg anteriorly; tenderness minimal.  Bandage over left lateral anterior knee clean, dry, intact.  The results of significant diagnostics from this hospitalization (including imaging, microbiology, ancillary and laboratory) are listed below for reference.     Microbiology: Recent Results (from the past 240 hour(s))  Culture, blood (Routine x 2)     Status: None   Collection Time: 12/19/18  1:54 PM   Specimen: BLOOD  Result Value Ref Range Status   Specimen Description BLOOD SITE NOT SPECIFIED  Final   Special Requests   Final    BOTTLES DRAWN AEROBIC AND ANAEROBIC Blood Culture adequate  volume   Culture   Final    NO GROWTH 5 DAYS Performed at [facility], [address] [place], [place] [address]    Report Status 12/24/2018 FINAL  Final  SARS CORONAVIRUS 2 Nasal Swab Aptima Multi Swab     Status: None   Collection Time: 12/19/18 11:57 PM   Specimen: Aptima Multi Swab; Nasal Swab  Result Value Ref Range Status   SARS Coronavirus 2 NEGATIVE NEGATIVE Final    Comment: (NOTE) SARS-CoV-2 target nucleic acids are NOT DETECTED. The SARS-CoV-2 RNA is generally detectable in upper and lower respiratory specimens during the acute phase of infection. Negative results do not preclude SARS-CoV-2 infection, do not rule out co-infections with other pathogens, and should not be used as the sole basis for treatment or other patient management decisions. Negative results must be combined with clinical observations, patient history, and epidemiological information. The expected result is Negative. Fact Sheet for Patients: SugarRoll.be Fact Sheet for Healthcare Providers: https://www.woods-mathews.com/ This test is not yet approved or cleared by the [place] FDA and  has been authorized for detection and/or diagnosis of SARS-CoV-2 by FDA under an Emergency Use Authorization (EUA). This EUA will remain  in effect (meaning this test can be used) for the duration of the COVID-19 declaration under Section 56 4(b)(1) of the Act, 21 U.S.C. section 360bbb-3(b)(1), unless the authorization is terminated or revoked sooner. Performed at [facility], [place] [address] [place], [place] [address]   Surgical pcr screen     Status: Abnormal   Collection Time: 12/20/18 12:28 AM   Specimen: Aptima Multi Swab; Nasal Swab  Result Value Ref Range Status   MRSA, PCR NEGATIVE NEGATIVE Final   Staphylococcus aureus POSITIVE (A) NEGATIVE Final    Comment: (NOTE) The Xpert SA Assay (FDA approved for NASAL specimens in patients 73 years of  age and older), is one component of a comprehensive surveillance program. It is not intended to diagnose infection nor to guide or monitor treatment. Performed at [facility], [place] [address] [place], [place] [address]   Culture, blood (Routine x 2)     Status: None   Collection Time: 12/20/18  1:08 AM   Specimen: BLOOD  Result Value Ref Range Status   Specimen Description BLOOD RIGHT ANTECUBITAL  Final   Special Requests   Final    BOTTLES DRAWN AEROBIC AND ANAEROBIC Blood Culture results may not be optimal due to an inadequate volume of blood received in culture bottles   Culture  Final    NO GROWTH 5 DAYS Performed at [facility], [place] [address] [place], [place] [address]    Report Status 12/25/2018 FINAL  Final     Labs:  Basic Metabolic Panel: Recent Labs  Lab 12/20/18 0854 12/21/18 0303 12/23/18 0301 12/24/18 0517 12/25/18 0511 12/26/18 0249  NA  --  135 136 133* 131* 130*  K  --  3.5 3.0* 2.8* 2.7* 3.9  CL  --  101 95* 91* 88* 91*  CO2  --  _0 GLUCOSE  --  90 93 101* 103* 114*  BUN  --  8 5* 6* 13 18  CREATININE  --  0.72 0.61 0.79 0.86 0.95  CALCIUM  --  8.8* 8.9 9.0 9.0 9.2  MG 1.8 2.1  --   --  1.7  --    Liver Function Tests: Recent Labs  Lab 12/19/18 1447 12/20/18 0112  AST 113* 106*  ALT 74* 72*  ALKPHOS 137* 130*  BILITOT 0.6 0.8  PROT 7.4 6.9  ALBUMIN 2.7* 2.4*   CBC: Recent Labs  Lab 12/19/18 1447  12/21/18 0303 12/23/18 0301 12/24/18 0517 12/25/18 0511 12/26/18 0249  WBC 11.7*   < > 9.6 7.2 7.9 7.7 8.8  NEUTROABS 8.3*  --  6.0  --   --   --   --   HGB 10.4*   < > 9.5* 10.2* 11.4* 11.6* 11.8*  HCT 31.9*   < > 29.0* 30.9* 34.6* 35.4* 35.6*  MCV 95.8   < > 96.0 94.8 94.3 92.9 93.0  PLT 350   < > 354 414* 425* 464* 474*   < > = values in this interval not displayed.    Urinalysis    Component Value Date/Time   COLORURINE YELLOW 12/20/2018 0446   APPEARANCEUR CLEAR 12/20/2018 0446   LABSPEC 1.011  12/20/2018 0446   PHURINE 6.0 12/20/2018 0446   GLUCOSEU NEGATIVE 12/20/2018 0446   HGBUR NEGATIVE 12/20/2018 0446   BILIRUBINUR NEGATIVE 12/20/2018 0446   KETONESUR NEGATIVE 12/20/2018 0446   PROTEINUR 100 (A) 12/20/2018 0446   NITRITE NEGATIVE 12/20/2018 0446   LEUKOCYTESUR NEGATIVE 12/20/2018 0446   Microbiology Recent Results (from the past 240 hour(s))  Culture, blood (Routine x 2)     Status: None   Collection Time: 12/19/18  1:54 PM   Specimen: BLOOD  Result Value Ref Range Status   Specimen Description BLOOD SITE NOT SPECIFIED  Final   Special Requests   Final    BOTTLES DRAWN AEROBIC AND ANAEROBIC Blood Culture adequate volume   Culture   Final    NO GROWTH 5 DAYS Performed at [facility], [address], [place], [place] [address]    Report Status 12/24/2018 FINAL  Final  SARS CORONAVIRUS 2 Nasal Swab Aptima Multi Swab     Status: None   Collection Time: 12/19/18 11:57 PM   Specimen: Aptima Multi Swab; Nasal Swab  Result Value Ref Range Status   SARS Coronavirus 2 NEGATIVE NEGATIVE Final    Comment: (NOTE) SARS-CoV-2 target nucleic acids are NOT DETECTED. The SARS-CoV-2 RNA is generally detectable in upper and lower respiratory specimens during the acute phase of infection. Negative results do not preclude SARS-CoV-2 infection, do not rule out co-infections with other pathogens, and should not be used as the sole basis for treatment or other patient management decisions. Negative results must be combined with clinical observations, patient history, and epidemiological information. The expected result is  Negative. Fact Sheet for Patients: SugarRoll.be Fact Sheet for Healthcare Providers: https://www.woods-mathews.com/ This test is not yet approved or cleared by the [place] FDA and  has been authorized for detection and/or diagnosis of SARS-CoV-2 by FDA under an Emergency Use Authorization (EUA). This EUA will  remain  in effect (meaning this test can be used) for the duration of the COVID-19 declaration under Section 56 4(b)(1) of the Act, 21 U.S.C. section 360bbb-3(b)(1), unless the authorization is terminated or revoked sooner. Performed at [facility], [place] [address] [place], [place] [address]   Surgical pcr screen     Status: Abnormal   Collection Time: 12/20/18 12:28 AM   Specimen: Aptima Multi Swab; Nasal Swab  Result Value Ref Range Status   MRSA, PCR NEGATIVE NEGATIVE Final   Staphylococcus aureus POSITIVE (A) NEGATIVE Final    Comment: (NOTE) The Xpert SA Assay (FDA approved for NASAL specimens in patients 14 years of age and older), is one component of a comprehensive surveillance program. It is not intended to diagnose infection nor to guide or monitor treatment. Performed at [facility], [place] [address], [place], [place] [address]   Culture, blood (Routine x 2)     Status: None   Collection Time: 12/20/18  1:08 AM   Specimen: BLOOD  Result Value Ref Range Status   Specimen Description BLOOD RIGHT ANTECUBITAL  Final   Special Requests   Final    BOTTLES DRAWN AEROBIC AND ANAEROBIC Blood Culture results may not be optimal due to an inadequate volume of blood received in culture bottles   Culture   Final    NO GROWTH 5 DAYS Performed at [facility], [place] [address], [place], [place] [address]    Report Status 12/25/2018 FINAL  Final    Time coordinating discharge: Over 30 minutes  SIGNED:   Little Ishikawa, DO [facility] 12/26/2018, 7:51 AM Pager   If 7PM-7AM, please contact night-coverage [email or web address] Password [email or web address]

## 2019-01-04 ENCOUNTER — Ambulatory Visit (INDEPENDENT_AMBULATORY_CARE_PROVIDER_SITE_OTHER): Payer: Medicare Other | Admitting: Adult Health

## 2019-01-04 ENCOUNTER — Encounter: Payer: Self-pay | Admitting: Adult Health

## 2019-01-04 ENCOUNTER — Other Ambulatory Visit: Payer: Self-pay

## 2019-01-04 VITALS — Temp 96.8°F | Ht 62.0 in | Wt 298.8 lb

## 2019-01-04 DIAGNOSIS — R569 Unspecified convulsions: Secondary | ICD-10-CM | POA: Diagnosis not present

## 2019-01-04 DIAGNOSIS — G4733 Obstructive sleep apnea (adult) (pediatric): Secondary | ICD-10-CM

## 2019-01-04 DIAGNOSIS — Z9989 Dependence on other enabling machines and devices: Secondary | ICD-10-CM

## 2019-01-04 DIAGNOSIS — G2581 Restless legs syndrome: Secondary | ICD-10-CM | POA: Diagnosis not present

## 2019-01-04 DIAGNOSIS — Z5181 Encounter for therapeutic drug level monitoring: Secondary | ICD-10-CM | POA: Diagnosis not present

## 2019-01-04 NOTE — Patient Instructions (Signed)
Your Plan:  Continue Horizant and Mirapex for RLS Continue phenobarbital for seizures Use CPAP nightly  If your symptoms worsen or you develop new symptoms please let us know.   Thank you for coming to see [place] at [facility]. I hope we have been able to provide you high quality care today.  You may receive a patient satisfaction survey over the next few weeks. We would appreciate your feedback and comments so that we may continue to improve ourselves and the health of our patients.
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
## 2019-01-05 LAB — CBC WITH DIFFERENTIAL/PLATELET
Basophils Absolute: 0.1 10*3/uL (ref 0.0–0.2)
Basos: 1 %
EOS (ABSOLUTE): 0.2 10*3/uL (ref 0.0–0.4)
Eos: 2 %
Hematocrit: 36.9 % (ref 34.0–46.6)
Hemoglobin: 12.3 g/dL (ref 11.1–15.9)
Immature Grans (Abs): 0 10*3/uL (ref 0.0–0.1)
Immature Granulocytes: 0 %
Lymphocytes Absolute: 4.3 10*3/uL — ABNORMAL HIGH (ref 0.7–3.1)
Lymphs: 47 %
MCH: 29.9 pg (ref 26.6–33.0)
MCHC: 33.3 g/dL (ref 31.5–35.7)
MCV: 90 fL (ref 79–97)
Monocytes Absolute: 0.7 10*3/uL (ref 0.1–0.9)
Monocytes: 8 %
Neutrophils Absolute: 3.9 10*3/uL (ref 1.4–7.0)
Neutrophils: 42 %
Platelets: 321 10*3/uL (ref 150–450)
RBC: 4.11 x10E6/uL (ref 3.77–5.28)
RDW: 13.1 % (ref 11.7–15.4)
WBC: 9.2 10*3/uL (ref 3.4–10.8)

## 2019-01-05 LAB — COMPREHENSIVE METABOLIC PANEL
ALT: 33 IU/L — ABNORMAL HIGH (ref 0–32)
AST: 31 IU/L (ref 0–40)
Albumin/Globulin Ratio: 1.3 (ref 1.2–2.2)
Albumin: 4.2 g/dL (ref 3.8–4.8)
Alkaline Phosphatase: 107 IU/L (ref 39–117)
BUN/Creatinine Ratio: 38 — ABNORMAL HIGH (ref 12–28)
BUN: 36 mg/dL — ABNORMAL HIGH (ref 8–27)
Bilirubin Total: 0.3 mg/dL (ref 0.0–1.2)
CO2: 21 mmol/L (ref 20–29)
Calcium: 9.4 mg/dL (ref 8.7–10.3)
Chloride: 94 mmol/L — ABNORMAL LOW (ref 96–106)
Creatinine, Ser: 0.94 mg/dL (ref 0.57–1.00)
GFR calc Af Amer: 73 mL/min/{1.73_m2} (ref >59)
GFR calc non Af Amer: 63 mL/min/{1.73_m2} (ref >59)
Globulin, Total: 3.3 g/dL (ref 1.5–4.5)
Glucose: 88 mg/dL (ref 65–99)
Potassium: 3.6 mmol/L (ref 3.5–5.2)
Sodium: 136 mmol/L (ref 134–144)
Total Protein: 7.5 g/dL (ref 6.0–8.5)

## 2019-01-05 LAB — PHENOBARBITAL LEVEL: Phenobarbital, Serum: 22 ug/mL (ref 15–40)

## 2019-01-09 ENCOUNTER — Telehealth: Payer: Self-pay

## 2019-01-09 NOTE — Telephone Encounter (Signed)
-----   Message from Ward Givens, NP sent at 01/05/2019  5:03 PM EDT ----- Lab work relatively unremarkable.  Consistent with previous blood work.  Please call patient with results

## 2019-01-09 NOTE — Telephone Encounter (Signed)
Spoke with the patient and she verbalized understanding her results. No questions or concerns at this time.   

## 2019-01-21 ENCOUNTER — Other Ambulatory Visit: Payer: Self-pay | Admitting: Cardiology

## 2019-01-21 ENCOUNTER — Other Ambulatory Visit: Payer: Self-pay | Admitting: Adult Health

## 2019-01-21 DIAGNOSIS — G2581 Restless legs syndrome: Secondary | ICD-10-CM

## 2019-01-28 ENCOUNTER — Other Ambulatory Visit: Payer: Self-pay | Admitting: Cardiology

## 2019-01-30 ENCOUNTER — Telehealth: Payer: Self-pay | Admitting: Adult Health

## 2019-01-30 MED ORDER — PHENOBARBITAL 64.8 MG PO TABS: 129.6000 mg | ORAL_TABLET | Freq: Every day | ORAL | 5 refills | Status: DC

## 2019-01-30 NOTE — Telephone Encounter (Signed)
Pt stated she needed refill for Phenobarbital due to Rx expired - Per Pharmacist, called office requesting return call and have not heard back regarding refilling med.  Also states her form for DMV that [employer] completed and she mailed to [facility] and they stated form was missing information.

## 2019-01-30 NOTE — Telephone Encounter (Signed)
Summer called from [facility] wanting to know if RN has received any of the refill requests that have been sent in for the pt's PHENobarbital (LUMINAL) 64.8 MG tablet Please advise.

## 2019-01-30 NOTE — Telephone Encounter (Signed)
I faxed to pts pharmacy (after MM/NP signing).  Fax confirmation received.

## 2019-01-31 NOTE — Telephone Encounter (Signed)
I called pt and she did receive that her phenobarbital prescription was done and at pharmacy.  She stated that last sz noted 2018 which is not right.  (last syncope episode she said was 2017 via Cardiologist).  She received letter from [facility] states she needs letter, she will bring to office tomorrow.

## 2019-02-14 ENCOUNTER — Telehealth: Payer: Self-pay | Admitting: *Deleted

## 2019-02-14 NOTE — Telephone Encounter (Signed)
Received old DMV form from pt.  Made changes and to MM/NP to sign, (who is out until Wednesday).  Placed in office for signature of addended form

## 2019-02-22 ENCOUNTER — Telehealth: Payer: Self-pay | Admitting: *Deleted

## 2019-02-22 NOTE — Telephone Encounter (Signed)
DMV for faxed on 02/22/19 to [phone]

## 2019-04-03 ENCOUNTER — Other Ambulatory Visit: Payer: Self-pay | Admitting: Cardiology

## 2019-04-04 ENCOUNTER — Other Ambulatory Visit: Payer: Self-pay | Admitting: Neurology

## 2019-04-04 DIAGNOSIS — G2581 Restless legs syndrome: Secondary | ICD-10-CM

## 2019-04-05 ENCOUNTER — Other Ambulatory Visit: Payer: Self-pay

## 2019-04-05 DIAGNOSIS — G2581 Restless legs syndrome: Secondary | ICD-10-CM

## 2019-04-05 MED ORDER — PRAMIPEXOLE DIHYDROCHLORIDE 0.25 MG PO TABS: 0.2500 mg | ORAL_TABLET | Freq: Every day | ORAL | 1 refills | Status: DC

## 2019-04-11 ENCOUNTER — Other Ambulatory Visit: Payer: Self-pay | Admitting: Cardiology

## 2019-04-19 ENCOUNTER — Telehealth: Payer: Self-pay | Admitting: *Deleted

## 2019-04-19 NOTE — Telephone Encounter (Signed)
Horizant PA completed on CMM. Key: [id number]. Awaiting [facility] Rx determination.

## 2019-04-24 ENCOUNTER — Encounter: Payer: Self-pay | Admitting: *Deleted

## 2019-04-24 NOTE — Telephone Encounter (Signed)
Appeal letter signed by MM, NP. I faxed the letter, denial notice, and last office note to [employer] for a standard 7 day appeal (current auth goes through 05/11/19). Received a receipt of confirmation.

## 2019-04-24 NOTE — Telephone Encounter (Signed)
Per CMM, Horizant denied d/t pt not having tried/failed or had contraindication or intolerance to Ropinorole. I have an appeal letter ready for NP signature. Latest office note also printed to include with appeal.  Request Reference #: [id number] Standard Fax: [phone]

## 2019-04-27 ENCOUNTER — Other Ambulatory Visit: Payer: Self-pay | Admitting: Cardiology

## 2019-05-22 ENCOUNTER — Encounter: Payer: Self-pay | Admitting: *Deleted

## 2019-05-22 NOTE — Telephone Encounter (Signed)
We have not heard back from [facility]. I called them and spoke with Clarissa. She stated they do not have an active account for the patient. I reached out to the patient in mychart to see if insurance has changed. Waiting to hear back.

## 2019-05-29 ENCOUNTER — Other Ambulatory Visit: Payer: Self-pay | Admitting: Cardiology
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
## 2019-06-21 ENCOUNTER — Encounter: Payer: Self-pay | Admitting: Adult Health

## 2019-06-21 ENCOUNTER — Telehealth (INDEPENDENT_AMBULATORY_CARE_PROVIDER_SITE_OTHER): Payer: Medicare PPO | Admitting: Adult Health

## 2019-06-21 VITALS — BP 130/80 | HR 92 | Ht 62.0 in | Wt 315.0 lb

## 2019-06-21 DIAGNOSIS — I10 Essential (primary) hypertension: Secondary | ICD-10-CM | POA: Diagnosis not present

## 2019-06-21 DIAGNOSIS — E7800 Pure hypercholesterolemia, unspecified: Secondary | ICD-10-CM

## 2019-06-21 DIAGNOSIS — I5032 Chronic diastolic (congestive) heart failure: Secondary | ICD-10-CM

## 2019-06-21 DIAGNOSIS — K219 Gastro-esophageal reflux disease without esophagitis: Secondary | ICD-10-CM

## 2019-06-21 DIAGNOSIS — G4733 Obstructive sleep apnea (adult) (pediatric): Secondary | ICD-10-CM

## 2019-06-21 DIAGNOSIS — I519 Heart disease, unspecified: Secondary | ICD-10-CM

## 2019-06-21 DIAGNOSIS — Z9989 Dependence on other enabling machines and devices: Secondary | ICD-10-CM

## 2019-06-21 NOTE — Patient Instructions (Signed)
Medication Instructions:  Continue current medications  *If you need a refill on your cardiac medications before your next appointment, please call your pharmacy*  Lab Work: None Ordered  Testing/Procedures: Your physician has requested that you have an echocardiogram. Echocardiography is a painless test that uses sound waves to create images of your heart. It provides your doctor with information about the size and shape of your heart and how well your heart's chambers and valves are working. This procedure takes approximately one hour. There are no restrictions for this procedure.  Follow-Up: At [facility], you and your health needs are our priority.  As part of our continuing mission to provide you with exceptional heart care, we have created designated Provider Care Teams.  These Care Teams include your primary Cardiologist (physician) and Advanced Practice Providers (APPs -  Physician Assistants and Nurse Practitioners) who all work together to provide you with the care you need, when you need it.  Your next appointment:   1 month(s)  The format for your next appointment:   In Person  Provider:   Joni Reining, DNP, ANP

## 2019-07-03 ENCOUNTER — Other Ambulatory Visit: Payer: Self-pay

## 2019-07-03 ENCOUNTER — Ambulatory Visit (HOSPITAL_COMMUNITY): Payer: Medicare PPO | Attending: Cardiovascular Disease

## 2019-07-03 DIAGNOSIS — I519 Heart disease, unspecified: Secondary | ICD-10-CM | POA: Insufficient documentation

## 2019-07-03 DIAGNOSIS — I5032 Chronic diastolic (congestive) heart failure: Secondary | ICD-10-CM | POA: Insufficient documentation

## 2019-07-03 HISTORY — PX: TRANSTHORACIC ECHOCARDIOGRAM: SHX275

## 2019-07-10 ENCOUNTER — Encounter: Payer: Self-pay | Admitting: Adult Health

## 2019-07-10 ENCOUNTER — Ambulatory Visit: Payer: Medicare PPO | Admitting: Adult Health

## 2019-07-10 ENCOUNTER — Other Ambulatory Visit: Payer: Self-pay

## 2019-07-10 VITALS — BP 127/64 | HR 80 | Temp 97.7°F | Ht 62.0 in | Wt 319.8 lb

## 2019-07-10 DIAGNOSIS — G2581 Restless legs syndrome: Secondary | ICD-10-CM

## 2019-07-10 DIAGNOSIS — R569 Unspecified convulsions: Secondary | ICD-10-CM

## 2019-07-10 DIAGNOSIS — G4733 Obstructive sleep apnea (adult) (pediatric): Secondary | ICD-10-CM | POA: Diagnosis not present

## 2019-07-10 DIAGNOSIS — Z9989 Dependence on other enabling machines and devices: Secondary | ICD-10-CM

## 2019-07-10 NOTE — Patient Instructions (Signed)
Your Plan:  Continue Horizant and Mirapex Continue Phenobarbital for seizures Continue using CPAP therapy If your symptoms worsen or you develop new symptoms please let us know.   Thank you for coming to see [place] at [facility]. I hope we have been able to provide you high quality care today.  You may receive a patient satisfaction survey over the next few weeks. We would appreciate your feedback and comments so that we may continue to improve ourselves and the health of our patients.
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
## 2019-07-11 DIAGNOSIS — S81801A Unspecified open wound, right lower leg, initial encounter: Secondary | ICD-10-CM | POA: Diagnosis not present

## 2019-07-11 DIAGNOSIS — E876 Hypokalemia: Secondary | ICD-10-CM | POA: Diagnosis not present

## 2019-07-11 DIAGNOSIS — I890 Lymphedema, not elsewhere classified: Secondary | ICD-10-CM | POA: Diagnosis not present

## 2019-07-11 DIAGNOSIS — Z79899 Other long term (current) drug therapy: Secondary | ICD-10-CM | POA: Diagnosis not present

## 2019-07-11 DIAGNOSIS — L0390 Cellulitis, unspecified: Secondary | ICD-10-CM | POA: Diagnosis not present

## 2019-07-17 ENCOUNTER — Other Ambulatory Visit: Payer: Self-pay | Admitting: Cardiology
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
## 2019-07-19 ENCOUNTER — Telehealth (INDEPENDENT_AMBULATORY_CARE_PROVIDER_SITE_OTHER): Payer: Medicare PPO | Admitting: Adult Health

## 2019-07-19 ENCOUNTER — Encounter: Payer: Self-pay | Admitting: Adult Health

## 2019-07-19 VITALS — BP 138/54 | HR 77 | Temp 97.0°F | Ht 62.0 in | Wt 305.0 lb

## 2019-07-19 DIAGNOSIS — G4733 Obstructive sleep apnea (adult) (pediatric): Secondary | ICD-10-CM

## 2019-07-19 DIAGNOSIS — I10 Essential (primary) hypertension: Secondary | ICD-10-CM | POA: Diagnosis not present

## 2019-07-19 DIAGNOSIS — I5032 Chronic diastolic (congestive) heart failure: Secondary | ICD-10-CM

## 2019-07-19 DIAGNOSIS — R600 Localized edema: Secondary | ICD-10-CM

## 2019-07-19 DIAGNOSIS — Z9989 Dependence on other enabling machines and devices: Secondary | ICD-10-CM

## 2019-07-19 NOTE — Patient Instructions (Signed)

## 2019-07-21 ENCOUNTER — Other Ambulatory Visit: Payer: Self-pay

## 2019-07-21 ENCOUNTER — Encounter (HOSPITAL_BASED_OUTPATIENT_CLINIC_OR_DEPARTMENT_OTHER): Payer: Medicare PPO | Attending: Internal Medicine | Admitting: Internal Medicine

## 2019-07-21 DIAGNOSIS — G40909 Epilepsy, unspecified, not intractable, without status epilepticus: Secondary | ICD-10-CM | POA: Insufficient documentation

## 2019-07-21 DIAGNOSIS — I890 Lymphedema, not elsewhere classified: Secondary | ICD-10-CM | POA: Insufficient documentation

## 2019-07-21 DIAGNOSIS — Z888 Allergy status to other drugs, medicaments and biological substances status: Secondary | ICD-10-CM | POA: Diagnosis not present

## 2019-07-21 DIAGNOSIS — Z885 Allergy status to narcotic agent status: Secondary | ICD-10-CM | POA: Insufficient documentation

## 2019-07-21 DIAGNOSIS — I110 Hypertensive heart disease with heart failure: Secondary | ICD-10-CM | POA: Diagnosis not present

## 2019-07-21 DIAGNOSIS — I5030 Unspecified diastolic (congestive) heart failure: Secondary | ICD-10-CM | POA: Insufficient documentation

## 2019-07-21 DIAGNOSIS — Z6841 Body Mass Index (BMI) 40.0 and over, adult: Secondary | ICD-10-CM | POA: Insufficient documentation

## 2019-07-21 DIAGNOSIS — G4733 Obstructive sleep apnea (adult) (pediatric): Secondary | ICD-10-CM | POA: Insufficient documentation
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
## 2019-07-21 NOTE — Progress Notes (Addendum)
Shelley, Green ([id number]) Visit Report for 07/21/2019 Allergy List Details Patient Name: Date of Service: [name], [name] 07/21/2019 2:45 PM Medical Record [id number] Patient Account Number: [id number] Date of Birth/Sex: 1950/05/23 (69 y.o. Female) Treating RN: Carlene Coria Primary Care Seville Downs: [name] Jordan Other Clinician: Referring Bunny Lowdermilk: Treating Demaya Hardge/Extender:Robson, Gilmer Mor, Mayford Knife in Treatment: 0 Allergies Active Allergies codeine Reaction: nausea iodinated diagnostic tests Compazine Reaction: SOB Severity: Moderate Allergy Notes Electronic Signature(s) Signed: 07/21/2019 5:30:50 PM By: Carlene Coria RN Entered By: Carlene Coria on 07/21/2019 15:08:16 -------------------------------------------------------------------------------- Arrival Information Details Patient Name: Date of Service: [name] 07/21/2019 2:45 PM Medical Record [id number] Patient Account Number: [id number] Date of Birth/Sex: 04-22-1951 (69 y.o. Female) Treating RN: Carlene Coria Primary Care Brandie Lopes: [name] Jordan Other Clinician: Referring Janneth Krasner: Treating Yuya Vanwingerden/Extender:Robson, Gilmer Mor, Mayford Knife in Treatment: 0 Visit Information Patient Arrived: Wheel Chair Arrival Time: 15:00 Accompanied By: husband Transfer Assistance: None Patient Identification Verified: Yes Sig Secondary Verification Process Completed: Yes vis Patient Requires Transmission-Based No Hos Precautions: Imp Patient Has Alerts: No cel sin Justus Memory History Since Last Visit All ordered tests and consults were completed: No Added or deleted any medications: No Any new allergies or adverse reactions: No Had a fall or experienced change in activities of daily living that may affect risk of falls: No ns or symptoms of abuse/neglect since last ito No pitalized since last visit: No lantable device outside of the clinic excluding lular tissue based products placed  in the center ce last visit: No Electronic Signature(s) Signed: 07/21/2019 5:30:50 PM By: Carlene Coria RN Entered By: Carlene Coria on 07/21/2019 15:07:17 -------------------------------------------------------------------------------- Clinic Level of Care Assessment Details Patient Name: Date of Service: Shelley, Green 07/21/2019 2:45 PM Medical Record [id number] Patient Account Number: [id number] Date of Birth/Sex: 08-30-1950 (69 y.o. Female) Treating RN: Kela Millin Primary Care Chanci Ojala: [name] Jordan Other Clinician: Referring Vista Sawatzky: Treating Tijuana Scheidegger/Extender:Robson, Gilmer Mor, Mayford Knife in Treatment: 0 Clinic Level of Care Assessment Items TOOL 2 Quantity Score X - Use when only an EandM is performed on the INITIAL visit 1 0 ASSESSMENTS - Nursing Assessment / Reassessment X - General Physical Exam (combine w/ comprehensive assessment (listed just below) 1 20 when performed on new pt. evals) X - Comprehensive Assessment (HX, ROS, Risk Assessments, Wounds Hx, etc.) 1 25 ASSESSMENTS - Wound and Skin Assessment / Reassessment X - Simple Wound Assessment / Reassessment - one wound 1 5 []  - Complex Wound Assessment / Reassessment - multiple wounds 0 []  - Dermatologic / Skin Assessment (not related to wound area) 0 ASSESSMENTS - Ostomy and/or Continence Assessment and Care []  - Incontinence Assessment and Management 0 []  - Ostomy Care Assessment and Management (repouching, etc.) 0 PROCESS - Coordination of Care X - Simple Patient / Family Education for ongoing care 1 15 []  - Complex (extensive) Patient / Family Education for ongoing care 0 X - Staff obtains Programmer, systems, Records, Test Results / Process Orders 1 10 []  - Staff telephones HHA, Nursing Homes / Clarify orders / etc 0 []  - Routine Transfer to another Facility (non-emergent condition) 0 []  - Routine Hospital Admission (non-emergent condition) 0 X - New Admissions / Biomedical engineer /  Ordering NPWT, Apligraf, etc. 1 15 []  - Emergency Hospital Admission (emergent condition) 0 X - Simple Discharge Coordination 1 10 []  - Complex (extensive) Discharge Coordination 0 PROCESS - Special Needs []  - Pediatric / Minor Patient Management 0 []  - Isolation Patient Management 0 []  - Hearing / Language / Visual special needs 0 []  -  Assessment of Community assistance (transportation, D/C planning, etc.) 0 []  - Additional assistance / Altered mentation 0 []  - Support Surface(s) Assessment (bed, cushion, seat, etc.) 0 INTERVENTIONS - Wound Cleansing / Measurement []  - Wound Imaging (photographs - any number of wounds) 0 []  - Wound Tracing (instead of photographs) 0 []  - Simple Wound Measurement - one wound 0 []  - Complex Wound Measurement - multiple wounds 0 []  - Simple Wound Cleansing - one wound 0 []  - Complex Wound Cleansing - multiple wounds 0 INTERVENTIONS - Wound Dressings []  - Small Wound Dressing one or multiple wounds 0 []  - Medium Wound Dressing one or multiple wounds 0 []  - Large Wound Dressing one or multiple wounds 0 []  - Application of Medications - injection 0 INTERVENTIONS - Miscellaneous []  - External ear exam 0 []  - Specimen Collection (cultures, biopsies, blood, body fluids, etc.) 0 []  - Specimen(s) / Culture(s) sent or taken to Lab for analysis 0 []  - Patient Transfer (multiple staff / / Similar devices) 0 []  - Simple Staple / Suture removal (25 or less) 0 []  - Complex Staple / Suture removal (26 or more) 0 []  - Hypo / Hyperglycemic Management (close monitor of Blood Glucose) 0 []  - Ankle / Brachial Index (ABI) - do not check if billed separately 0 Has the patient been seen at the hospital within the last three years: Yes Total Score: 100 Level Of Care: New/Established - Level 3 Electronic Signature(s) Signed: 07/24/2019 5:25:45 PM By: Entered By: on 07/24/2019  11:34:02 -------------------------------------------------------------------------------- Encounter Discharge Information Details Patient Name: Date of Service: 07/21/2019 2:45 PM Medical Record Patient Account Number: Date of Birth/Sex: 1950-05-30 (68 y.o. Female) Treating RN: Primary Care Linley Moskal: Other Clinician: Referring Oddie Kuhlmann: Treating Lael Wetherbee/Extender:Robson, , in Treatment: 0 Encounter Discharge Information Items Discharge Condition: Stable Ambulatory Status: Wheelchair Discharge Destination: Home Transportation: Private Auto Accompanied By: self Schedule Follow-up Appointment: Yes Clinical Summary of Care: Electronic Signature(s) Signed: 07/21/2019 5:45:00 PM By: Nurse, adult Entered By: on 07/21/2019 17:43:50 -------------------------------------------------------------------------------- Lower Extremity Assessment Details Patient Name: Date of Service: SHYIA, FILLINGIM 07/21/2019 2:45 PM Medical Record 07/26/2019 Patient Account Number: Cherylin Mylar Date of Birth/Sex: [birth year] (68 y.o. Female) Treating RN: Denver Faster Primary Care Arica Bevilacqua: 09/20/2019 Other Clinician: Referring Jacqueleen Pulver: Treating Benjerman Molinelli/Extender:Robson, [id number], [id number] in Treatment: 0 Edema Assessment Assessed: [Left: No] [Right: No] Edema: [Left: Ye] [Right: s] Calf Left: Right: Point of Measurement: 46 cm From Medial Instep cm 70 cm Ankle Left: Right: Point of Measurement: 11 cm From Medial Instep cm 31 cm Vascular Assessment Blood Pressure: Brachial: [Right:140] Ankle: [Right:Dorsalis Pedis: 130 0.93] Electronic Signature(s) Signed: 07/21/2019 5:30:50 PM By: 02-06-1989 RN Entered By: Shawn Stall on 07/21/2019 15:30:40 -------------------------------------------------------------------------------- Vitals Details Patient Name: Date of  Service: Era Skeen 07/21/2019 2:45 PM Medical Record 09/20/2019 Patient Account Number: Shawn Stall Date of Birth/Sex: 1950/08/24 (68 y.o. Female) Treating RN: Denver Faster Primary Care Lucious Zou: 09/20/2019 Other Clinician: Referring Neilson Oehlert: Treating Nardos Putnam/Extender:Robson, [id number], [id number] in Treatment: 0 Vital Signs Time Taken: 15:07 Temperature (F): 98.2 Height (in): 62 Pulse (bpm): 74 Source: Stated Respiratory Rate (breaths/min): 18 Weight (lbs): 305 Blood Pressure (mmHg): 140/51 Source: Stated Reference Range: 80 - 120 mg / dl Body Mass Index (BMI): 55.8 Electronic Signature(s) Signed: 07/21/2019 5:30:50 PM By: 02-06-1989 RN Entered By: Yevonne Pax on 07/21/2019 15:08:01

## 2019-07-24 NOTE — Progress Notes (Addendum)
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 incisional wounds are totally closed there is no open area. She continues to have poorly controlled edema which I research last time. At least in terms of her lower extremities I think this is partially venous insufficiency and lymphedema. She had zippered stockings but expresses her extreme reluctance to use these as they're too tight and difficult to put on. We gave her the name of elastic therapy and [place] as a good place to look at affordable reasonable quality stockings. I am doubtful she will follow-up on this READMISSION 07/30/2018 This is a patient that we had in clinic here in 2017 in 2018 with wounds on her right lateral ankle in the setting of previous surgery for right trimalleolar fractures. She had chronic venous insufficiency and lymphedema noted at that point in time. She is here for review and follow-up of her problems with her lower extremity lymphedema right greater than left. She says she has noted increased swelling in the right leg greater than the left to the point where she can no longer get her stockings on which are some form of standard over the toe stocking. She purchased some form of compression pump on [employer] but I think it only goes up to her mid calf by her description. And indicates she has not really been using the  seat either because she was scared about the leaking edema she has coming out of the lateral part of her right leg. She says it is puritic but not painful. She does not have an open wound in this area and does not have a history of lower extremity wounds which looking at her right leg especially is indeed fortunate. 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 Patient History Information obtained from Patient. Allergies codeine (Reaction: nausea), iodinated diagnostic tests, Compazine (Severity: Moderate, Reaction: SOB) Family History Cancer - Mother,Father, Heart Disease - Mother, Stroke, Thyroid Problems - Mother, No family history of Diabetes, Hereditary Spherocytosis, Hypertension, Kidney Disease, Lung Disease, Seizures, Tuberculosis. Social History Former smoker, Marital Status - Married, Alcohol Use - Rarely, Drug Use - No History, Caffeine Use - Daily - coke.  Medical History Eyes Denies history of Cataracts, Glaucoma, Optic Neuritis Ear/Nose/Mouth/Throat Denies history of Chronic sinus problems/congestion, Middle ear problems Hematologic/Lymphatic Patient has history of Anemia, Lymphedema Denies history of Hemophilia, Human Immunodeficiency Virus, Sickle Cell Disease Respiratory Patient has history of Sleep Apnea - CPAP Denies history of Aspiration, Asthma, Chronic Obstructive Pulmonary Disease (COPD), Pneumothorax, Tuberculosis Cardiovascular Patient has history of Hypertension Denies history of Angina, Arrhythmia, Congestive Heart Failure, Coronary Artery Disease, Deep Vein Thrombosis, Hypotension, Myocardial Infarction, Peripheral Arterial Disease, Peripheral Venous Disease, Phlebitis, Vasculitis Gastrointestinal Denies history of Cirrhosis , Colitis, Crohnoos, Hepatitis A, Hepatitis B, Hepatitis C Endocrine Denies history of Type I Diabetes,  Type II Diabetes Genitourinary Denies history of End Stage Renal Disease Immunological Denies history of Lupus Erythematosus, Raynaudoos, Scleroderma Integumentary (Skin) Denies history of History of Burn Neurologic Patient has history of Seizure Disorder - 1992 Denies history of Dementia, Neuropathy, Quadriplegia, Paraplegia Hospitalization/Surgery History - ankle repair. - hammer toe repair. Medical And Surgical History Notes Constitutional Symptoms (General Health) restless leg , bilat lower ext edema Musculoskeletal closed (R) ankle fx Review of Systems (ROS) Constitutional Symptoms (General Health) Denies complaints or symptoms of Fatigue, Fever, Chills, Marked Weight Change. Eyes Denies complaints or symptoms of Dry Eyes, Vision Changes, Glasses / Contacts. Ear/Nose/Mouth/Throat Denies complaints or symptoms of Chronic sinus problems or rhinitis. Respiratory Denies complaints or symptoms of Chronic or frequent coughs, Shortness of Breath. Cardiovascular Denies complaints or symptoms of Chest pain. Gastrointestinal Denies complaints or symptoms of Frequent diarrhea, Nausea, Vomiting. Endocrine Denies complaints or symptoms of Heat/cold intolerance. Genitourinary Denies complaints or symptoms of Frequent urination. Integumentary (Skin) Denies complaints or symptoms of Wounds. Musculoskeletal Denies complaints or symptoms of Muscle Pain, Muscle Weakness. Neurologic Denies complaints or symptoms of Numbness/parasthesias. Psychiatric Denies complaints or symptoms of Claustrophobia, Suicidal. Objective Constitutional Sitting or standing Blood Pressure is within target range for patient.. Pulse regular and within target range for patient.[name] Respirations regular, non-labored and within target range.. Temperature is normal and within the target range for the patient.[name] Appears in no distress. Vitals Time Taken: 3:07 PM, Height: 62 in, Source: Stated, Weight: 305 lbs, Source:  Stated, BMI: 55.8, Temperature: 98.2 F, Pulse: 74 bpm, Respiratory Rate: 18 breaths/min, Blood Pressure: 140/51 mmHg. Eyes Conjunctivae clear. No discharge.no icterus. Respiratory work of breathing is normal. Cardiovascular Heart rhythm and rate regular, without murmur or gallop. No evidence of CHF. [place] pulses are palpable. Psychiatric appears at normal baseline. General Notes: Wound exam; there is no open wound here. The extent of the lymphedema in the right lateral calf is more than enough to understand some degree of weeping edema. Nevertheless even under illumination there is no open wound. Integumentary (Hair, Skin) She has dry fissured scaling cobblestone skin compatible with stage IV lymphedema.. Assessment Active Problems ICD-10 Lymphedema, not elsewhere classified Morbid (severe) obesity due to excess calories Plan Discharge From [facility] Services: Discharge from [facility] Skin Barriers/Peri-Wound Care: Moisturizing lotion - Moisturize skin three times a day and before bed, Cetaphil Edema Control: Other: - Use Lymphedema pumps for 1 hour, three times a day. Services and Therapies ordered were: Lymphedema Clinic - Consult to Lymphedema Clinic for Bilateral Legs 1. I have asked the patient to use her pumps that she purchased on [employer]. Anything here is better than nothing 2. I have asked her to lotion her skin up to 3 times a day 3. The size of her right leg would preclude any easily applied stocking at this point. 4. We will refer her to  our lymphedema specialist Trevor Mace 5. The patient is at significant risk of skin breakdown in the right lateral leg. I suspect she will need at least upper thigh level compression pumps and probably lymphedema wraps to bring down the edema in the right leg. She will probably need above-knee stockings or lymphedema garments as well. 6. There was no evidence of cellulitis or DVT. She has diastolic congestive heart failure but no  evidence of this at the bedside. I spent 35 minutes in review of this patient's past history, face-to-face evaluation and preparation of this record Electronic Signature(s) Signed: 07/24/2019 8:43:01 AM By: Baltazar Najjar MD Entered By: Baltazar Najjar on 07/21/2019 17:26:05 -------------------------------------------------------------------------------- HxROS Details Patient Name: Date of Service: [name] 07/21/2019 2:45 PM Medical Record [id number] Patient Account Number: [id number] Date of Birth/Sex: [birth year] (68 y.o. Female) Treating RN: Yevonne Pax Primary Care Provider: [name] Other Clinician: Referring Provider: Treating Provider/Extender:Itzael Liptak, Era Skeen, Gardner Candle in Treatment: 0 Information Obtained From Patient Constitutional Symptoms (General Health) Complaints and Symptoms: Negative for: Fatigue; Fever; Chills; Marked Weight Change Medical History: Past Medical History Notes: restless leg , bilat lower ext edema Eyes Complaints and Symptoms: Negative for: Dry Eyes; Vision Changes; Glasses / Contacts Medical History: Negative for: Cataracts; Glaucoma; Optic Neuritis Ear/Nose/Mouth/Throat Complaints and Symptoms: Negative for: Chronic sinus problems or rhinitis Medical History: Negative for: Chronic sinus problems/congestion; Middle ear problems Respiratory Complaints and Symptoms: Negative for: Chronic or frequent coughs; Shortness of Breath Medical History: Positive for: Sleep Apnea - CPAP Negative for: Aspiration; Asthma; Chronic Obstructive Pulmonary Disease (COPD); Pneumothorax; Tuberculosis Cardiovascular Complaints and Symptoms: Negative for: Chest pain Medical History: Positive for: Hypertension Negative for: Angina; Arrhythmia; Congestive Heart Failure; Coronary Artery Disease; Deep Vein Thrombosis; Hypotension; Myocardial Infarction; Peripheral Arterial Disease; Peripheral Venous Disease; Phlebitis;  Vasculitis Gastrointestinal Complaints and Symptoms: Negative for: Frequent diarrhea; Nausea; Vomiting Medical History: Negative for: Cirrhosis ; Colitis; Crohns; Hepatitis A; Hepatitis B; Hepatitis C Endocrine Complaints and Symptoms: Negative for: Heat/cold intolerance Medical History: Negative for: Type I Diabetes; Type II Diabetes Genitourinary Complaints and Symptoms: Negative for: Frequent urination Medical History: Negative for: End Stage Renal Disease Integumentary (Skin) Complaints and Symptoms: Negative for: Wounds Medical History: Negative for: History of Burn Musculoskeletal Complaints and Symptoms: Negative for: Muscle Pain; Muscle Weakness Medical History: Past Medical History Notes: closed (R) ankle fx Neurologic Complaints and Symptoms: Negative for: Numbness/parasthesias Medical History: Positive for: Seizure Disorder - 1992 Negative for: Dementia; Neuropathy; Quadriplegia; Paraplegia Psychiatric Complaints and Symptoms: Negative for: Claustrophobia; Suicidal Hematologic/Lymphatic Medical History: Positive for: Anemia; Lymphedema Negative for: Hemophilia; Human Immunodeficiency Virus; Sickle Cell Disease Immunological Medical History: Negative for: Lupus Erythematosus; Raynauds; Scleroderma Oncologic Immunizations Pneumococcal Vaccine: Received Pneumococcal Vaccination: No Implantable Devices None Hospitalization / Surgery History Type of Hospitalization/Surgery ankle repair hammer toe repair Family and Social History Cancer: Yes - Mother,Father; Diabetes: No; Heart Disease: Yes - Mother; Hereditary Spherocytosis: No; Hypertension: No; Kidney Disease: No; Lung Disease: No; Seizures: No; Stroke: Yes; Thyroid Problems: Yes - Mother; Tuberculosis: No; Former smoker; Marital Status - Married; Alcohol Use: Rarely; Drug Use: No History; Caffeine Use: Daily - coke; Financial Concerns: No; Food, Clothing or Shelter Needs: No; Support System Lacking:  No; Transportation Concerns: No Electronic Signature(s) Signed: 07/21/2019 5:30:50 PM By: Yevonne Pax RN Signed: 07/24/2019 8:43:01 AM By: Baltazar Najjar MD Entered By: Yevonne Pax on 07/21/2019 15:09:47 -------------------------------------------------------------------------------- SuperBill Details Patient Name: Date of Service: [name] 07/21/2019 Medical Record [id number] Patient Account Number: [id number] Date of Birth/Sex: 1950-07-30 (68  y.o. Female) Treating RN: Cherylin Mylar Primary Care Provider: [name] Other Clinician: Referring Provider: Treating Provider/Extender:Makalya Nave, Era Skeen, Gardner Candle in Treatment: 0 Diagnosis Coding ICD-10 Codes Code Description I89.0 Lymphedema, not elsewhere classified E66.01 Morbid (severe) obesity due to excess calories Facility Procedures CPT4 Code: 16109604 Description: 99213 - WOUND CARE VISIT-LEV 3 EST PT Modifier: Quantity: 1 Physician Procedures CPT4 Code: 5409811 Description: 99214 - WC PHYS LEVEL 4 - EST PT ICD-10 Diagnosis Description I89.0 Lymphedema, not elsewhere classified E66.01 Morbid (severe) obesity due to excess calories Modifier: Quantity: 1 Electronic Signature(s) Signed: 07/24/2019 5:25:45 PM By: Cherylin Mylar Signed: 07/25/2019 10:32:20 AM By: Baltazar Najjar MD Previous Signature: 07/24/2019 8:43:01 AM Version By: Baltazar Najjar MD Entered By: Cherylin Mylar on 07/24/2019 11:34:18

## 2019-07-27 DIAGNOSIS — R790 Abnormal level of blood mineral: Secondary | ICD-10-CM | POA: Diagnosis not present

## 2019-07-27 DIAGNOSIS — E876 Hypokalemia: Secondary | ICD-10-CM | POA: Diagnosis not present

## 2019-08-10 ENCOUNTER — Other Ambulatory Visit: Payer: Self-pay | Admitting: Cardiology

## 2019-08-15 ENCOUNTER — Other Ambulatory Visit: Payer: Self-pay | Admitting: Adult Health

## 2019-08-16 ENCOUNTER — Other Ambulatory Visit: Payer: Self-pay | Admitting: Cardiology

## 2019-08-16 NOTE — Telephone Encounter (Signed)
*  STAT* If patient is at the pharmacy, call can be transferred to refill team.   1. Which medications need to be refilled? (please list name of each medication and dose if known)  metolazone (ZAROXOLYN) 5 MG tablet  2. Which pharmacy/location (including street and city if local pharmacy) is medication to be sent to? [id number] - [place], [place] - [address]  3. Do they need a 30 day or 90 day supply? 90 day supply  Patient is currently out of medication.

## 2019-08-18 MED ORDER — METOLAZONE 5 MG PO TABS: 5.0000 mg | ORAL_TABLET | Freq: Every day | ORAL | 0 refills | Status: DC

## 2019-08-18 NOTE — Telephone Encounter (Signed)
Rx request sent to pharmacy.  

## 2019-08-22 ENCOUNTER — Ambulatory Visit: Payer: Medicare PPO | Attending: Internal Medicine

## 2019-08-22 ENCOUNTER — Other Ambulatory Visit: Payer: Self-pay

## 2019-08-22 DIAGNOSIS — I890 Lymphedema, not elsewhere classified: Secondary | ICD-10-CM
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
## 2019-08-24 ENCOUNTER — Ambulatory Visit: Payer: Medicare PPO

## 2019-08-29 ENCOUNTER — Other Ambulatory Visit: Payer: Self-pay

## 2019-08-29 ENCOUNTER — Ambulatory Visit: Payer: Medicare PPO

## 2019-08-29 DIAGNOSIS — I890 Lymphedema, not elsewhere classified: Secondary | ICD-10-CM | POA: Diagnosis not present
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
## 2019-08-29 NOTE — Patient Instructions (Addendum)
Deep Effective Breath   Standing, sitting, or laying down place both hands on the belly. Take a deep breath IN, expanding the belly; then breath OUT, contracting the belly. Repeat __5__ times. Do __2-3__ sessions per day and before each self massage.  http://gt2.exer.us/866   Copyright  VHI. All rights reserved.  Inguinal Nodes to Axilla - Clear   On involved side, at armpit, make _5__ in-place circles. Then from hip proceed in sections to armpit with stationary circles or pumps _5_ times, this is your pathway. Do _1__ time per day.  Copyright  VHI. All rights reserved.

## 2019-08-31 ENCOUNTER — Other Ambulatory Visit: Payer: Self-pay

## 2019-08-31 ENCOUNTER — Ambulatory Visit: Payer: Medicare PPO

## 2019-08-31 DIAGNOSIS — I890 Lymphedema, not elsewhere classified: Secondary | ICD-10-CM
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
## 2019-09-05 ENCOUNTER — Ambulatory Visit: Payer: Medicare PPO

## 2019-09-05 ENCOUNTER — Other Ambulatory Visit: Payer: Self-pay

## 2019-09-05 DIAGNOSIS — I890 Lymphedema, not elsewhere classified: Secondary | ICD-10-CM
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
## 2019-09-06 ENCOUNTER — Other Ambulatory Visit: Payer: Self-pay | Admitting: Adult Health

## 2019-09-07 ENCOUNTER — Other Ambulatory Visit: Payer: Self-pay

## 2019-09-07 ENCOUNTER — Ambulatory Visit: Payer: Medicare PPO

## 2019-09-07 DIAGNOSIS — I890 Lymphedema, not elsewhere classified: Secondary | ICD-10-CM
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
## 2019-09-12 ENCOUNTER — Other Ambulatory Visit: Payer: Self-pay | Admitting: Adult Health

## 2019-09-12 ENCOUNTER — Ambulatory Visit: Payer: Medicare PPO | Attending: Internal Medicine

## 2019-09-12 ENCOUNTER — Other Ambulatory Visit: Payer: Self-pay

## 2019-09-12 DIAGNOSIS — G2581 Restless legs syndrome: Secondary | ICD-10-CM

## 2019-09-12 DIAGNOSIS — I890 Lymphedema, not elsewhere classified: Secondary | ICD-10-CM | POA: Diagnosis not present
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
## 2019-09-14 ENCOUNTER — Ambulatory Visit: Payer: Medicare PPO

## 2019-09-14 ENCOUNTER — Other Ambulatory Visit: Payer: Self-pay

## 2019-09-14 DIAGNOSIS — I890 Lymphedema, not elsewhere classified: Secondary | ICD-10-CM
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
## 2019-09-19 ENCOUNTER — Ambulatory Visit: Payer: Medicare PPO

## 2019-09-19 ENCOUNTER — Other Ambulatory Visit: Payer: Self-pay

## 2019-09-19 DIAGNOSIS — I890 Lymphedema, not elsewhere classified: Secondary | ICD-10-CM | POA: Diagnosis not present
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
## 2019-09-21 ENCOUNTER — Ambulatory Visit: Payer: Medicare PPO

## 2019-09-21 ENCOUNTER — Other Ambulatory Visit: Payer: Self-pay

## 2019-09-21 DIAGNOSIS — I890 Lymphedema, not elsewhere classified: Secondary | ICD-10-CM
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
## 2019-09-26 ENCOUNTER — Ambulatory Visit: Payer: Medicare PPO

## 2019-09-26 ENCOUNTER — Other Ambulatory Visit: Payer: Self-pay

## 2019-09-26 DIAGNOSIS — I890 Lymphedema, not elsewhere classified: Secondary | ICD-10-CM | POA: Diagnosis not present
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
## 2019-09-28 ENCOUNTER — Other Ambulatory Visit: Payer: Self-pay

## 2019-09-28 ENCOUNTER — Ambulatory Visit: Payer: Medicare PPO

## 2019-09-28 DIAGNOSIS — I890 Lymphedema, not elsewhere classified: Secondary | ICD-10-CM
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
## 2019-10-03 ENCOUNTER — Ambulatory Visit: Payer: Medicare PPO

## 2019-10-03 ENCOUNTER — Other Ambulatory Visit: Payer: Self-pay

## 2019-10-03 DIAGNOSIS — I890 Lymphedema, not elsewhere classified: Secondary | ICD-10-CM | POA: Diagnosis not present
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
## 2019-10-05 ENCOUNTER — Other Ambulatory Visit: Payer: Self-pay

## 2019-10-05 ENCOUNTER — Ambulatory Visit: Payer: Medicare PPO

## 2019-10-05 DIAGNOSIS — I890 Lymphedema, not elsewhere classified: Secondary | ICD-10-CM
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
## 2019-10-20 DIAGNOSIS — I890 Lymphedema, not elsewhere classified: Secondary | ICD-10-CM | POA: Diagnosis not present

## 2019-10-24 ENCOUNTER — Ambulatory Visit: Payer: Medicare PPO | Attending: Internal Medicine

## 2019-10-24 ENCOUNTER — Other Ambulatory Visit: Payer: Self-pay

## 2019-10-24 DIAGNOSIS — I890 Lymphedema, not elsewhere classified: Secondary | ICD-10-CM
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
## 2019-10-31 ENCOUNTER — Other Ambulatory Visit: Payer: Self-pay

## 2019-10-31 ENCOUNTER — Ambulatory Visit: Payer: Medicare PPO

## 2019-10-31 DIAGNOSIS — I890 Lymphedema, not elsewhere classified: Secondary | ICD-10-CM | POA: Diagnosis not present
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
## 2019-11-11 ENCOUNTER — Other Ambulatory Visit: Payer: Self-pay | Admitting: Cardiology

## 2019-11-21 ENCOUNTER — Other Ambulatory Visit: Payer: Self-pay | Admitting: *Deleted

## 2019-11-21 DIAGNOSIS — G2581 Restless legs syndrome: Secondary | ICD-10-CM

## 2019-11-21 MED ORDER — PRAMIPEXOLE DIHYDROCHLORIDE 0.25 MG PO TABS: ORAL_TABLET | ORAL | 1 refills | Status: DC

## 2019-11-21 NOTE — Telephone Encounter (Signed)
Received request pt asking for new rx with taking 1-2 tabs )of the 0.25mg , takes 0.75mg  qhs.

## 2019-11-22 DIAGNOSIS — L0390 Cellulitis, unspecified: Secondary | ICD-10-CM | POA: Diagnosis not present

## 2019-11-22 DIAGNOSIS — I8310 Varicose veins of unspecified lower extremity with inflammation: Secondary | ICD-10-CM | POA: Diagnosis not present

## 2019-11-22 DIAGNOSIS — M722 Plantar fascial fibromatosis: Secondary | ICD-10-CM | POA: Diagnosis not present

## 2019-11-22 DIAGNOSIS — E876 Hypokalemia: Secondary | ICD-10-CM | POA: Diagnosis not present

## 2019-11-22 DIAGNOSIS — S81809A Unspecified open wound, unspecified lower leg, initial encounter: Secondary | ICD-10-CM | POA: Diagnosis not present

## 2019-11-22 DIAGNOSIS — I890 Lymphedema, not elsewhere classified: Secondary | ICD-10-CM | POA: Diagnosis not present

## 2019-12-19 DIAGNOSIS — M722 Plantar fascial fibromatosis: Secondary | ICD-10-CM | POA: Diagnosis not present

## 2019-12-19 DIAGNOSIS — M79671 Pain in right foot: Secondary | ICD-10-CM | POA: Diagnosis not present

## 2019-12-20 ENCOUNTER — Ambulatory Visit
Admission: RE | Admit: 2019-12-20 | Discharge: 2019-12-20 | Disposition: A | Discharge status: Home / Self Care | Payer: Medicare PPO | Source: Ambulatory Visit | Attending: Physician Assistant | Admitting: Physician Assistant

## 2019-12-20 ENCOUNTER — Other Ambulatory Visit: Payer: Self-pay | Admitting: Physician Assistant

## 2019-12-20 DIAGNOSIS — M7989 Other specified soft tissue disorders: Secondary | ICD-10-CM | POA: Diagnosis not present

## 2019-12-20 DIAGNOSIS — M19071 Primary osteoarthritis, right ankle and foot: Secondary | ICD-10-CM | POA: Diagnosis not present

## 2019-12-20 DIAGNOSIS — Z981 Arthrodesis status: Secondary | ICD-10-CM | POA: Diagnosis not present

## 2019-12-20 DIAGNOSIS — M79671 Pain in right foot: Secondary | ICD-10-CM

## 2019-12-22 ENCOUNTER — Encounter (HOSPITAL_BASED_OUTPATIENT_CLINIC_OR_DEPARTMENT_OTHER): Payer: Medicare PPO | Attending: Internal Medicine | Admitting: Internal Medicine

## 2019-12-22 DIAGNOSIS — I872 Venous insufficiency (chronic) (peripheral): Secondary | ICD-10-CM | POA: Insufficient documentation

## 2019-12-22 DIAGNOSIS — L97211 Non-pressure chronic ulcer of right calf limited to breakdown of skin: Secondary | ICD-10-CM | POA: Diagnosis not present

## 2019-12-22 DIAGNOSIS — I671 Cerebral aneurysm, nonruptured: Secondary | ICD-10-CM | POA: Insufficient documentation

## 2019-12-22 DIAGNOSIS — I890 Lymphedema, not elsewhere classified: Secondary | ICD-10-CM | POA: Diagnosis not present

## 2019-12-22 DIAGNOSIS — I110 Hypertensive heart disease with heart failure: Secondary | ICD-10-CM | POA: Insufficient documentation

## 2019-12-22 DIAGNOSIS — G4733 Obstructive sleep apnea (adult) (pediatric): Secondary | ICD-10-CM | POA: Diagnosis not present

## 2019-12-22 DIAGNOSIS — Z87891 Personal history of nicotine dependence: Secondary | ICD-10-CM | POA: Diagnosis not present

## 2019-12-22 DIAGNOSIS — I5030 Unspecified diastolic (congestive) heart failure: Secondary | ICD-10-CM | POA: Diagnosis not present

## 2019-12-22 DIAGNOSIS — L97812 Non-pressure chronic ulcer of other part of right lower leg with fat layer exposed: Secondary | ICD-10-CM | POA: Diagnosis not present

## 2019-12-22 DIAGNOSIS — Z6841 Body Mass Index (BMI) 40.0 and over, adult: Secondary | ICD-10-CM | POA: Diagnosis not present

## 2019-12-22 DIAGNOSIS — I5032 Chronic diastolic (congestive) heart failure: Secondary | ICD-10-CM | POA: Insufficient documentation

## 2019-12-29 ENCOUNTER — Encounter (HOSPITAL_BASED_OUTPATIENT_CLINIC_OR_DEPARTMENT_OTHER): Payer: Medicare PPO | Admitting: Internal Medicine

## 2019-12-29 DIAGNOSIS — L97211 Non-pressure chronic ulcer of right calf limited to breakdown of skin: Secondary | ICD-10-CM | POA: Diagnosis not present

## 2019-12-29 DIAGNOSIS — I671 Cerebral aneurysm, nonruptured: Secondary | ICD-10-CM | POA: Diagnosis not present

## 2019-12-29 DIAGNOSIS — L97811 Non-pressure chronic ulcer of other part of right lower leg limited to breakdown of skin: Secondary | ICD-10-CM | POA: Diagnosis not present

## 2019-12-29 DIAGNOSIS — I5032 Chronic diastolic (congestive) heart failure: Secondary | ICD-10-CM | POA: Diagnosis not present

## 2019-12-29 DIAGNOSIS — I110 Hypertensive heart disease with heart failure: Secondary | ICD-10-CM | POA: Diagnosis not present

## 2019-12-29 DIAGNOSIS — I872 Venous insufficiency (chronic) (peripheral): Secondary | ICD-10-CM | POA: Diagnosis not present

## 2019-12-29 DIAGNOSIS — I890 Lymphedema, not elsewhere classified: Secondary | ICD-10-CM | POA: Diagnosis not present

## 2019-12-29 DIAGNOSIS — I5030 Unspecified diastolic (congestive) heart failure: Secondary | ICD-10-CM | POA: Diagnosis not present

## 2019-12-29 DIAGNOSIS — G4733 Obstructive sleep apnea (adult) (pediatric): Secondary | ICD-10-CM | POA: Diagnosis not present

## 2020-01-01 NOTE — Progress Notes (Signed)
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 swelling in the right leg greater than the left to the point where she can no longer get her stockings on which are some form of standard over the toe stocking. She purchased some form of compression pump on [employer] but I think it only goes up to her mid calf by her description. And indicates she has not really been using the seat either because she was scared about the leaking edema she has coming out of the lateral part of her right leg. She says it is puritic but not painful. She does not have an open wound in this area and does not have a history of lower extremity wounds which looking at her right leg especially is indeed fortunate. 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 READMISSION 12/22/2019- 69 year old morbidly obese female with OSA, diastolic heart failure, history of ICA aneurysm repair, seizure disorder, chronic venous insufficiency- return to clinic with increased swelling over the past 2 months, does attend the lymphedema clinic, was given external pumps and has been using it once a day for  the past 2 months, despite which swelling in her legs has been increasing, she has developed stasis dermatitis rash with itching and tends to scratch quite a bit, has been applying AandE to both legs daily. Developed open area on the right lateral aspect of her right leg recently. Denies any fevers or chills  ABIs were unobtainable due to significant edema but pulses are palpable 8/20; the patient we had in the clinic previously. She has severe bilateral stage III lymphedema. She has compression pumps as well as modified stockings which I think were designed for her by a lymphedema specialist. The wound on her right leg that she came in with last week has closed. She says she wears her external compression pumps for 2 hours daily for 5 out of 7 days a week. I am uncertain about her compliance with the stockings. Objective Constitutional Patient is hypertensive.. Pulse regular and within target range for patient.[name] Respirations regular, non-labored and within target range.. Temperature is normal and within the target range for the patient.[name] Appears in no distress. Vitals Time Taken: 8:30 AM, Height: 62 in, Weight: 315 lbs, BMI: 57.6, Temperature: 98.0 F, Pulse: 72 bpm, Respiratory Rate: 20 breaths/min, Blood Pressure: 182/73 mmHg. General Notes: Wound exam; severe bilateral lymphedema with stasis dermatitis, nodular polypoid skin changes. She does not have any open wounds however. However her edema is not even close to being controlled. I could not feel her peripheral pulses however her feet are warm. Integumentary (Hair, Skin) Wound #6 status is Healed - Epithelialized. Original cause of wound was Gradually Appeared. The wound is located on the Right,Lateral Lower Leg. The wound measures 0cm length x 0cm width x 0cm depth; 0cm^2 area and 0cm^3 volume. There is no tunneling or undermining noted. There is a none present amount of drainage noted. There is no granulation within the wound bed. There is  no necrotic tissue within the wound bed. Assessment Active Problems ICD-10 Non-pressure chronic ulcer of right calf limited to breakdown of skin Lymphedema, not elsewhere classified Unspecified diastolic (congestive) heart failure Obstructive sleep apnea (adult) (pediatric) Plan Discharge From [facility] Services: Discharge from Wound Care Center Edema Control: Avoid standing for long periods of time Elevate legs to the level of the heart or above for 30 minutes daily and/or when sitting, a frequency of: Segmental Compressive Device. - Use Lymphedema pumps everyday, 2 times a day for 1 hour each. Other: - Wear your stockings/shorts thats therapist gave you 1. I have recommended using her external compression pumps twice a day for 1 hour 2. T reliably use her compression stockings daily. These were provided by a therapist. o 3. She is on her way to extensive skin problems in her bilateral lower legs and have spent some time talking to her about this. The only solution is control of the lymphedema with aggressive daily treatment as outlined. For now she has no open wound. No reason to follow here Electronic Signature(s) Signed: 01/01/2020 7:15:26 AM By: Baltazar Najjar MD Entered By: Baltazar Najjar on 12/29/2019 09:59:54 -------------------------------------------------------------------------------- SuperBill Details Patient Name: Date of Service: Carlis Abbott 12/29/2019 Medical Record Number: [id number] Patient Account Number: [id number] Date of Birth/Sex: Treating RN: [birth year] (69 y.o. Harvest Dark Primary Care Provider: [name] Other Clinician: Referring Provider: Treating Provider/Extender: [name] in Treatment: 1 Diagnosis Coding ICD-10 Codes Code Description [phone] Non-pressure chronic ulcer of right calf limited to breakdown of skin I89.0 Lymphedema, not elsewhere classified I50.30 Unspecified diastolic (congestive) heart  failure G47.33 Obstructive sleep apnea (adult) (pediatric) Facility Procedures CPT4 Code: [id number] Description: 99213 - WOUND CARE VISIT-LEV 3 EST PT Modifier: Quantity: 1 Physician Procedures Electronic Signature(s) Signed: 01/01/2020 7:15:26 AM By: Baltazar Najjar MD Entered By: Baltazar Najjar on 12/29/2019 10:00:12

## 2020-01-02 NOTE — Progress Notes (Signed)
[name], [name] ([id number]) Visit Report for 12/29/2019 Arrival Information Details Patient Name: Date of Service: LADA, FULBRIGHT 12/29/2019 8:15 A M Medical Record Number: [id number] Patient Account Number: [id number] Date of Birth/Sex: Treating RN: 28-Sep-1950 (69 y.o. Wynelle Link Primary Care Keerat Denicola: [name] Other Clinician: Referring Harveer Sadler: Treating Ruthia Person/Extender: [name] in Treatment: 1 Visit Information History Since Last Visit Added or deleted any medications: No Patient Arrived: Wheel Chair Any new allergies or adverse reactions: No Arrival Time: 08:28 Had a fall or experienced change in No Accompanied By: husband activities of daily living that may affect Transfer Assistance: None risk of falls: Patient Identification Verified: Yes Signs or symptoms of abuse/neglect since last visito No Secondary Verification Process Completed: Yes Hospitalized since last visit: No Patient Requires Transmission-Based Precautions: No Implantable device outside of the clinic excluding No Patient Has Alerts: Yes cellular tissue based products placed in the center Patient Alerts: non compressable since last visit: Has Dressing in Place as Prescribed: No Has Compression in Place as Prescribed: No Pain Present Now: No Electronic Signature(s) Signed: 01/01/2020 4:21:43 PM By: Zandra Abts RN, BSN Entered By: Zandra Abts on 12/29/2019 08:30:06 -------------------------------------------------------------------------------- Clinic Level of Care Assessment Details Patient Name: Date of Service: [name] 12/29/2019 8:15 A M Medical Record Number: [id number] Patient Account Number: [id number] Date of Birth/Sex: Treating RN: 05/20/1950 (69 y.o. Harvest Dark Primary Care Tersa Fotopoulos: [name] Other Clinician: Referring Giancarlos Berendt: Treating Floria Brandau/Extender: [name] in Treatment: 1 Clinic  Level of Care Assessment Items TOOL 4 Quantity Score X- 1 0 Use when only an EandM is performed on FOLLOW-UP visit ASSESSMENTS - Nursing Assessment / Reassessment X- 1 10 Reassessment of Co-morbidities (includes updates in patient status) X- 1 5 Reassessment of Adherence to Treatment Plan ASSESSMENTS - Wound and Skin A ssessment / Reassessment X - Simple Wound Assessment / Reassessment - one wound 1 5 []  - 0 Complex Wound Assessment / Reassessment - multiple wounds []  - 0 Dermatologic / Skin Assessment (not related to wound area) ASSESSMENTS - Focused Assessment X- 1 5 Circumferential Edema Measurements - multi extremities []  - 0 Nutritional Assessment / Counseling / Intervention []  - 0 Lower Extremity Assessment (monofilament, tuning fork, pulses) []  - 0 Peripheral Arterial Disease Assessment (using hand held doppler) ASSESSMENTS - Ostomy and/or Continence Assessment and Care []  - 0 Incontinence Assessment and Management []  - 0 Ostomy Care Assessment and Management (repouching, etc.) PROCESS - Coordination of Care X - Simple Patient / Family Education for ongoing care 1 15 []  - 0 Complex (extensive) Patient / Family Education for ongoing care X- 1 10 Staff obtains , Records, T Results / Process Orders est []  - 0 Staff telephones HHA, Nursing Homes / Clarify orders / etc []  - 0 Routine Transfer to another Facility (non-emergent condition) []  - 0 Routine Hospital Admission (non-emergent condition) []  - 0 New Admissions / / Ordering NPWT Apligraf, etc. , []  - 0 Emergency Hospital Admission (emergent condition) X- 1 10 Simple Discharge Coordination []  - 0 Complex (extensive) Discharge Coordination PROCESS - Special Needs []  - 0 Pediatric / Minor Patient Management []  - 0 Isolation Patient Management []  - 0 Hearing / Language / Visual special needs []  - 0 Assessment of Community assistance (transportation, D/C planning, etc.) []   - 0 Additional assistance / Altered mentation []  - 0 Support Surface(s) Assessment (bed, cushion, seat, etc.) INTERVENTIONS - Wound Cleansing / Measurement X - Simple Wound Cleansing -  one wound 1 5 []  - 0 Complex Wound Cleansing - multiple wounds X- 1 5 Wound Imaging (photographs - any number of wounds) []  - 0 Wound Tracing (instead of photographs) X- 1 5 Simple Wound Measurement - one wound []  - 0 Complex Wound Measurement - multiple wounds INTERVENTIONS - Wound Dressings []  - 0 Small Wound Dressing one or multiple wounds []  - 0 Medium Wound Dressing one or multiple wounds []  - 0 Large Wound Dressing one or multiple wounds []  - 0 Application of Medications - topical []  - 0 Application of Medications - injection INTERVENTIONS - Miscellaneous []  - 0 External ear exam []  - 0 Specimen Collection (cultures, biopsies, blood, body fluids, etc.) []  - 0 Specimen(s) / Culture(s) sent or taken to Lab for analysis []  - 0 Patient Transfer (multiple staff / / Similar devices) []  - 0 Simple Staple / Suture removal (25 or less) []  - 0 Complex Staple / Suture removal (26 or more) []  - 0 Hypo / Hyperglycemic Management (close monitor of Blood Glucose) []  - 0 Ankle / Brachial Index (ABI) - do not check if billed separately X- 1 5 Vital Signs Has the patient been seen at the hospital within the last three years: Yes Total Score: 80 Level Of Care: New/Established - Level 3 Electronic Signature(s) Signed: 12/29/2019 4:56:28 PM By: Entered By: on 12/29/2019 09:58:35 -------------------------------------------------------------------------------- Lower Extremity Assessment Details Patient Name: Date of Service: 12/29/2019 8:15 A M Medical Record Number: Patient Account Number: Date of Birth/Sex: Treating RN: 05-15-1950 (69 y.o. Primary Care Shunta Mclaurin: Nurse, adult Other  Clinician: Referring Million Maharaj: Treating Khiry Pasquariello/Extender: in Treatment: 1 Edema Assessment Assessed: : No] : No] Edema: [Left: Ye] [Right: s] Calf Left: Right: Point of Measurement: 42 cm From Medial Instep cm 64 cm Ankle Left: Right: Point of Measurement: 10 cm From Medial Instep cm 33 cm Vascular Assessment Pulses: Dorsalis Pedis Palpable: [Right:Yes] Electronic Signature(s) Signed: 01/01/2020 4:21:43 PM By: 12/31/2019 RN, BSN Entered By: Cherylin Mylar on 12/29/2019 08:34:04 -------------------------------------------------------------------------------- Multi Wound Chart Details Patient Name: Date of Service: 12/31/2019. 12/29/2019 8:15 A M Medical Record Number: 12/31/2019 Patient Account Number: [id number] Date of Birth/Sex: Treating RN: 01/19/1951 (69 y.o. 74 Primary Care Anastazia Creek: Wynelle Link Other Clinician: Referring Taran Hable: Treating Kasondra Junod/Extender: [name] in Treatment: 1 Vital Signs Height(in): 62 Pulse(bpm): 72 Weight(lbs): 315 Blood Pressure(mmHg): 182/73 Body Mass Index(BMI): 58 Temperature(F): 98.0 Respiratory Rate(breaths/min): 20 Photos: [6:No Photos Right, Lateral Lower Leg] [N/A:N/A N/A] Wound Location: [6:Gradually Appeared] [N/A:N/A] Wounding Event: [6:Lymphedema] [N/A:N/A] Primary Etiology: [6:Anemia, Lymphedema, Sleep Apnea,] [N/A:N/A] Comorbid History: [6:Hypertension, Seizure Disorder 11/09/2019] [N/A:N/A] Date Acquired: [6:1] [N/A:N/A] Weeks of Treatment: [6:Healed - Epithelialized] [N/A:N/A] Wound Status: [6:0x0x0] [N/A:N/A] Measurements L x W x D (cm) [6:0] [N/A:N/A] A (cm) : rea [6:0] [N/A:N/A] Volume (cm) : [6:100.00%] [N/A:N/A] % Reduction in Area: [6:100.00%] [N/A:N/A] % Reduction in Volume: [6:Full Thickness Without Exposed] [N/A:N/A] Classification: [6:Support Structures None Present] [N/A:N/A] Exudate Amount:  [6:None Present (0%)] [N/A:N/A] Granulation Amount: [6:None Present (0%)] [N/A:N/A] Necrotic Amount: [6:Fascia: No] [N/A:N/A] Exposed Structures: [6:Fat Layer (Subcutaneous Tissue): No Tendon: No Muscle: No Joint: No Bone: No Large (67-100%)] [N/A:N/A] Treatment Notes Electronic Signature(s) Signed: 12/29/2019 4:56:28 PM By: Franne Forts Signed: 01/01/2020 7:15:26 AM By: Zandra Abts MD Entered By: Zandra Abts on 12/29/2019 09:55:51 -------------------------------------------------------------------------------- Multi-Disciplinary Care Plan Details Patient Name: Date of Service: [place], 12/31/2019  LE H. 12/29/2019 8:15 A M Medical Record Number: [id number] Patient Account Number: [id number] Date of Birth/Sex: Treating RN: 08-31-1950 (69 y.o. Harvest Dark Primary Care Shaydon Lease: [name] Other Clinician: Referring Leenah Seidner: Treating Ebonee Stober/Extender: [name] in Treatment: 1 Active Inactive Electronic Signature(s) Signed: 12/29/2019 4:56:28 PM By: Cherylin Mylar Entered By: Cherylin Mylar on 12/29/2019 09:59:17 -------------------------------------------------------------------------------- Pain Assessment Details Patient Name: Date of Service: Carlis Abbott 12/29/2019 8:15 A M Medical Record Number: [id number] Patient Account Number: [id number] Date of Birth/Sex: Treating RN: 1950/05/23 (69 y.o. Wynelle Link Primary Care Carolynn Tuley: [name] Other Clinician: Referring Artemus Romanoff: Treating Reyah Streeter/Extender: [name] in Treatment: 1 Active Problems Location of Pain Severity and Description of Pain Patient Has Paino No Site Locations Pain Management and Medication Current Pain Management: Electronic Signature(s) Signed: 01/01/2020 4:21:43 PM By: Zandra Abts RN, BSN Entered By: Zandra Abts on 12/29/2019  08:30:43 -------------------------------------------------------------------------------- Patient/Caregiver Education Details Patient Name: Date of Service: Carlis Abbott 8/20/2021andnbsp8:15 A M Medical Record Number: [id number] Patient Account Number: [id number] Date of Birth/Gender: Treating RN: Aug 08, 1950 (69 y.o. Harvest Dark Primary Care Physician: [name] Other Clinician: Referring Physician: Treating Physician/Extender: [name] in Treatment: 1 Education Assessment Education Provided To: Patient Education Topics Provided Pain: Handouts: A Guide to Pain Control Methods: Explain/Verbal Responses: State content correctly Wound/Skin Impairment: Handouts: Caring for Your Ulcer Methods: Explain/Verbal Responses: State content correctly Electronic Signature(s) Signed: 12/29/2019 4:56:28 PM By: Cherylin Mylar Entered By: Cherylin Mylar on 12/29/2019 08:31:30 -------------------------------------------------------------------------------- Wound Assessment Details Patient Name: Date of Service: Carlis Abbott 12/29/2019 8:15 A M Medical Record Number: [id number] Patient Account Number: [id number] Date of Birth/Sex: Treating RN: 09-17-1950 (70 y.o. Wynelle Link Primary Care [place]: [name] Other Clinician: Referring Evone Arseneau: Treating Nataliah Hatlestad/Extender: [name] in Treatment: 1 Wound Status Wound Number: 6 Primary Lymphedema Etiology: Wound Location: Right, Lateral Lower Leg Wound Status: Healed - Epithelialized Wounding Event: Gradually Appeared Comorbid Anemia, Lymphedema, Sleep Apnea, Hypertension, Seizure Date Acquired: 11/09/2019 History: Disorder Weeks Of Treatment: 1 Clustered Wound: No Photos Photo Uploaded By: Benjaman Kindler on 01/01/2020 12:09:25 Wound Measurements Length: (cm) Width: (cm) Depth: (cm) Area: (cm) Volume: (cm) 0 % Reduction in Area:  100% 0 % Reduction in Volume: 100% 0 Epithelialization: Large (67-100%) 0 Tunneling: No 0 Undermining: No Wound Description Classification: Full Thickness Without Exposed Support Structures Exudate Amount: None Present Foul Odor After Cleansing: No Slough/Fibrino No Wound Bed Granulation Amount: None Present (0%) Exposed Structure Necrotic Amount: None Present (0%) Fascia Exposed: No Fat Layer (Subcutaneous Tissue) Exposed: No Tendon Exposed: No Muscle Exposed: No Joint Exposed: No Bone Exposed: No Electronic Signature(s) Signed: 12/29/2019 4:56:28 PM By: Cherylin Mylar Signed: 01/01/2020 4:21:43 PM By: Zandra Abts RN, BSN Entered By: Cherylin Mylar on 12/29/2019 09:18:46 -------------------------------------------------------------------------------- Vitals Details Patient Name: Date of Service: Carlis Abbott. 12/29/2019 8:15 A M Medical Record Number: [id number] Patient Account Number: [id number] Date of Birth/Sex: Treating RN: Nov 14, 1950 (69 y.o. Wynelle Link Primary Care Gaurav Baldree: [name] Other Clinician: Referring Lauri Till: Treating Merryl Buckels/Extender: [name] in Treatment: 1 Vital Signs Time Taken: 08:30 Temperature (F): 98.0 Height (in): 62 Pulse (bpm): 72 Weight (lbs): 315 Respiratory Rate (breaths/min): 20 Body Mass Index (BMI): 57.6 Blood Pressure (mmHg): 182/73 Reference Range: 80 - 120 mg / dl Electronic Signature(s) Signed: 01/01/2020 4:21:43 PM By: Zandra Abts RN, BSN Entered By: Zandra Abts on 12/29/2019 08:30:39

## 2020-01-05 DIAGNOSIS — M79671 Pain in right foot: Secondary | ICD-10-CM | POA: Diagnosis not present

## 2020-01-05 DIAGNOSIS — I890 Lymphedema, not elsewhere classified: Secondary | ICD-10-CM | POA: Diagnosis not present

## 2020-01-06 NOTE — Progress Notes (Signed)
[name] ([id number]) Visit Report for 12/22/2019 Allergy List Details Patient Name: Date of Service: [name], [name] 12/22/2019 9:00 A M Medical Record Number: [id number] Patient Account Number: [id number] Date of Birth/Sex: Treating RN: March 02, 1951 (69 y.o. [name] Primary Care Zuriah Bordas: [name] Other Clinician: Referring Laiklynn Raczynski: Treating Chrishawn Boley/Extender: Ruben Reason Weeks in Treatment: 0 Allergies Active Allergies codeine Reaction: nausea iodinated diagnostic tests Compazine Reaction: SOB Severity: Moderate Allergy Notes Electronic Signature(s) Signed: 01/05/2020 5:50:16 PM By: Yevonne Pax RN Entered By: Yevonne Pax on 12/22/2019 09:20:34 -------------------------------------------------------------------------------- Arrival Information Details Patient Name: Date of Service: [name]. 12/22/2019 9:00 A M Medical Record Number: [id number] Patient Account Number: [id number] Date of Birth/Sex: Treating RN: [birth year] (69 y.o. [name] Primary Care Devinn Voshell: [name] Other Clinician: Referring Latrece Nitta: Treating Cohen Boettner/Extender: [name] Resides in Treatment: 0 Visit Information Patient Arrived: Wheel Chair Arrival Time: 09:10 Accompanied By: self Transfer Assistance: None Patient Identification Verified: Yes Secondary Verification Process Completed: Yes Patient Requires Transmission-Based Precautions: No Patient Has Alerts: Yes Patient Alerts: non compressable History Since Last Visit All ordered tests and consults were completed: No Added or deleted any medications: No Any new allergies or adverse reactions: No Had a fall or experienced change in activities of daily living that may affect risk of falls: No Signs or symptoms of abuse/neglect since last visito No Hospitalized since last visit: No Implantable device outside of the clinic excluding cellular tissue based products  placed in the center since last visit: No Has Dressing in Place as Prescribed: Yes Pain Present Now: No Electronic Signature(s) Signed: 01/05/2020 5:50:16 PM By: Yevonne Pax RN Entered By: Yevonne Pax on 12/22/2019 09:36:36 -------------------------------------------------------------------------------- Clinic Level of Care Assessment Details Patient Name: Date of Service: [name], [name] 12/22/2019 9:00 A M Medical Record Number: [id number] Patient Account Number: [id number] Date of Birth/Sex: Treating RN: [birth year] (69 y.o. Harvest Dark Primary Care Malaky Tetrault: [name] Other Clinician: Referring Rayni Nemitz: Treating Zadie Deemer/Extender: [name] Resides in Treatment: 0 Clinic Level of Care Assessment Items TOOL 1 Quantity Score X- 1 0 Use when EandM and Procedure is performed on INITIAL visit ASSESSMENTS - Nursing Assessment / Reassessment X- 1 20 General Physical Exam (combine w/ comprehensive assessment (listed just below) when performed on new pt. evals) X- 1 25 Comprehensive Assessment (HX, ROS, Risk Assessments, Wounds Hx, etc.) ASSESSMENTS - Wound and Skin Assessment / Reassessment []  - 0 Dermatologic / Skin Assessment (not related to wound area) ASSESSMENTS - Ostomy and/or Continence Assessment and Care []  - 0 Incontinence Assessment and Management []  - 0 Ostomy Care Assessment and Management (repouching, etc.) PROCESS - Coordination of Care X - Simple Patient / Family Education for ongoing care 1 15 []  - 0 Complex (extensive) Patient / Family Education for ongoing care X- 1 10 Staff obtains , Records, T Results / Process Orders est []  - 0 Staff telephones HHA, Nursing Homes / Clarify orders / etc []  - 0 Routine Transfer to another Facility (non-emergent condition) []  - 0 Routine Hospital Admission (non-emergent condition) X- 1 15 New Admissions / / Ordering NPWT Apligraf, etc. , []  -  0 Emergency Hospital Admission (emergent condition) PROCESS - Special Needs []  - 0 Pediatric / Minor Patient Management []  - 0 Isolation Patient Management []  - 0 Hearing / Language / Visual special needs []  - 0 Assessment of Community assistance (transportation, D/C planning, etc.) []  - 0 Additional assistance / Altered mentation []  -  0 Support Surface(s) Assessment (bed, cushion, seat, etc.) INTERVENTIONS - Miscellaneous []  - 0 External ear exam []  - 0 Patient Transfer (multiple staff / / Similar devices) []  - 0 Simple Staple / Suture removal (25 or less) []  - 0 Complex Staple / Suture removal (26 or more) []  - 0 Hypo/Hyperglycemic Management (do not check if billed separately) X- 1 15 Ankle / Brachial Index (ABI) - do not check if billed separately Has the patient been seen at the hospital within the last three years: Yes Total Score: 100 Level Of Care: New/Established - Level 3 Electronic Signature(s) Signed: 12/22/2019 4:53:28 PM By: Nurse, adult Entered By: on 12/22/2019 10:13:27 -------------------------------------------------------------------------------- Compression Therapy Details Patient Name: Date of Service: . 12/22/2019 9:00 A M Medical Record Number: Cherylin Mylar Patient Account Number: Cherylin Mylar Date of Birth/Sex: Treating RN: Dec 18, 1950 (68 y.o. 12/24/2019 Primary Care Garvey Westcott: [phone] Other Clinician: Referring Nikolette Reindl: Treating Eilene Voigt/Extender: [id number] in Treatment: 0 Compression Therapy Performed for Wound Assessment: NonWound Condition Lymphedema - Left Leg Performed By: Clinician 12/30/1950, RN Compression Type: Three Layer Post Procedure Diagnosis Same as Pre-procedure Electronic Signature(s) Signed: 12/22/2019 4:53:28 PM By: Harvest Dark Entered By: [name] on 12/22/2019  10:01:38 -------------------------------------------------------------------------------- Compression Therapy Details Patient Name: Date of Service: [name]. 12/22/2019 9:00 A M Medical Record Number: Cherylin Mylar Patient Account Number: Cherylin Mylar Date of Birth/Sex: Treating RN: August 25, 1950 (69 y.o. 12/24/2019 Primary Care Malori Myers: [phone] Other Clinician: Referring Meagon Duskin: Treating Paul Trettin/Extender: [id number] in Treatment: 0 Compression Therapy Performed for Wound Assessment: Wound #6 Right,Lateral Lower Leg Performed By: Clinician 12/30/1950, RN Compression Type: Three Layer Post Procedure Diagnosis Same as Pre-procedure Electronic Signature(s) Signed: 12/22/2019 4:53:28 PM By: Harvest Dark Entered By: [name] on 12/22/2019 10:01:47 -------------------------------------------------------------------------------- Encounter Discharge Information Details Patient Name: Date of Service: [name], 12/24/2019. 12/22/2019 9:00 A M Medical Record Number: Cherylin Mylar Patient Account Number: 12/24/2019 Date of Birth/Sex: Treating RN: [birth year] (68 y.o. 12/24/2019 Primary Care Abu Heavin: [phone] Other Clinician: Referring Glynn Yepes: Treating Minnie Shi/Extender: [id number] in Treatment: 0 Encounter Discharge Information Items Discharge Condition: Stable Ambulatory Status: Wheelchair Discharge Destination: Home Transportation: Private Auto Accompanied By: spouse Schedule Follow-up Appointment: Yes Clinical Summary of Care: Patient Declined Electronic Signature(s) Signed: 12/22/2019 5:26:58 PM By: 79 RN, BSN Entered By: Tommye Standard on 12/22/2019 10:39:26 -------------------------------------------------------------------------------- Lower Extremity Assessment Details Patient Name: Date of Service: [name], 12/24/2019. 12/22/2019 9:00 A M Medical Record  Number: [name] Patient Account Number: 12/24/2019 Date of Birth/Sex: Treating RN: 08/19/1950 (69 y.o. 12/24/2019 Primary Care Lance Huaracha: [phone] Other Clinician: Referring George Alcantar: Treating Lizabeth Fellner/Extender: [id number] in Treatment: 0 Edema Assessment Assessed: 12/30/1950: No] 79: Yes] Edema: [Left: Ye] [Right: s] Calf Left: Right: Point of Measurement: 42 cm From Medial Instep cm 72 cm Ankle Left: Right: Point of Measurement: 10 cm From Medial Instep cm 32 cm Vascular Assessment Pulses: Dorsalis Pedis Palpable: [Right:Yes] Notes non compressable Electronic Signature(s) Signed: 01/05/2020 5:50:16 PM By: [name] RN Entered By: [name] Resides on 12/22/2019 09:36:13 -------------------------------------------------------------------------------- Multi-Disciplinary Care Plan Details Patient Name: Date of Service: [name], 01/07/2020. 12/22/2019 9:00 A M Medical Record Number: Yevonne Pax Patient Account Number: 12/24/2019 Date of Birth/Sex: Treating RN: October 31, 1950 (69 y.o. 12/24/2019 Primary Care Budd Freiermuth: Other Clinician: [id number] Referring Lacole Komorowski: Treating Lamar Naef/Extender: [id number] in Treatment: 0 Active  Inactive Orientation to the Wound Care Program Nursing Diagnoses: Knowledge deficit related to the wound healing center program Goals: Patient/caregiver will verbalize understanding of the Wound Healing Center Program Date Initiated: 12/22/2019 Target Resolution Date: 01/26/2020 Goal Status: Active Interventions: Provide education on orientation to the wound center Notes: Pain, Acute or Chronic Nursing Diagnoses: Pain, acute or chronic: actual or potential Goals: Patient/caregiver will verbalize adequate pain control between visits Date Initiated: 12/22/2019 Target Resolution Date: 01/26/2020 Goal Status: Active Interventions: Provide education on pain  management Notes: Wound/Skin Impairment Nursing Diagnoses: Impaired tissue integrity Goals: Ulcer/skin breakdown will have a volume reduction of 50% by week 8 Date Initiated: 12/22/2019 Target Resolution Date: 01/26/2020 Goal Status: Active Interventions: Provide education on ulcer and skin care Notes: Electronic Signature(s) Signed: 12/22/2019 4:53:28 PM By: Cherylin Mylarwiggins, Shannon Entered By: Cherylin Mylarwiggins, Shannon on 12/22/2019 09:39:50 -------------------------------------------------------------------------------- Pain Assessment Details Patient Name: Date of Service: Shelley AbbottUSSELL, Shelley LE H. 12/22/2019 9:00 A M Medical Record Number: [id number] Patient Account Number: [id number] Date of Birth/Sex: Treating RN: [birth year] (69 y.o. Freddy FinnerF) Epps, Carrie Primary Care Deandria Klute: Mila PalmerWolters, Sharon Other Clinician: Referring Araminta Zorn: Treating Kofi Murrell/Extender: [name] ResidesMadduri, Murthy Wolters, Sharon Weeks in Treatment: 0 Active Problems Location of Pain Severity and Description of Pain Patient Has Paino No Site Locations Pain Management and Medication Current Pain Management: Electronic Signature(s) Signed: 01/05/2020 5:50:16 PM By: Yevonne PaxEpps, Carrie RN Entered By: Yevonne PaxEpps, Carrie on 12/22/2019 09:38:36 -------------------------------------------------------------------------------- Patient/Caregiver Education Details Patient Name: Date of Service: [name], Shelley LE H. 8/13/2021andnbsp9:00 A M Medical Record Number: [id number] Patient Account Number: [id number] Date of Birth/Gender: Treating RN: [birth year] (69 y.o. Harvest DarkF) Dwiggins, Shannon Primary Care Physician: Mila PalmerWolters, Sharon Other Clinician: Referring Physician: Treating Physician/Extender: [name] ResidesMadduri, Murthy Wolters, Sharon Weeks in Treatment: 0 Education Assessment Education Provided To: Patient Education Topics Provided Pain: Handouts: A Guide to Pain Control Methods: Explain/Verbal Responses: Green content correctly Welcome T The [facility]: o Handouts: Welcome T The [facility] o Methods: Explain/Verbal Responses: Green content correctly Wound/Skin Impairment: Handouts: Caring for Your Ulcer Methods: Explain/Verbal Responses: Green content correctly Electronic Signature(s) Signed: 12/22/2019 4:53:28 PM By: Cherylin Mylarwiggins, Shannon Entered By: Cherylin Mylarwiggins, Shannon on 12/22/2019 10:02:38 -------------------------------------------------------------------------------- Wound Assessment Details Patient Name: Date of Service: Shelley AbbottUSSELL, Shelley LE H. 12/22/2019 9:00 A M Medical Record Number: [id number] Patient Account Number: [id number] Date of Birth/Sex: Treating RN: [birth year] (69 y.o. Freddy FinnerF) Epps, Carrie Primary Care Jasha Hodzic: Mila PalmerWolters, Sharon Other Clinician: Referring Evelin Cake: Treating Cassaundra Rasch/Extender: [name] ResidesMadduri, Murthy Wolters, Sharon Weeks in Treatment: 0 Wound Status Wound Number: 6 Primary Lymphedema Etiology: Wound Location: Right, Lateral Lower Leg Wound Status: Open Wounding Event: Gradually Appeared Comorbid Anemia, Lymphedema, Sleep Apnea, Hypertension, Seizure Date Acquired: 11/09/2019 History: Disorder Weeks Of Treatment: 0 Clustered Wound: No Photos Photo Uploaded By: Benjaman KindlerJones, Dedrick on 12/25/2019 11:24:46 Wound Measurements Length: (cm) 7.5 Width: (cm) 3.4 Depth: (cm) 0.1 Area: (cm) 20.028 Volume: (cm) 2.003 % Reduction in Area: % Reduction in Volume: Epithelialization: None Tunneling: No Undermining: No Wound Description Classification: Full Thickness Without Exposed Support Structures Exudate Amount: Medium Exudate Type: Serosanguineous Exudate Color: red, brown Foul Odor After Cleansing: No Slough/Fibrino Yes Wound Bed Granulation Amount: Medium (34-66%) Exposed Structure Granulation Quality: Pink Fascia Exposed: No Necrotic Amount: Medium (34-66%) Fat Layer (Subcutaneous Tissue) Exposed: Yes Necrotic Quality: Adherent Slough Tendon Exposed: No Muscle Exposed: No Joint Exposed:  No Bone Exposed: No Electronic Signature(s) Signed: 01/05/2020 5:50:16 PM By: Yevonne PaxEpps, Carrie RN Entered By: Yevonne PaxEpps, Carrie on 12/22/2019 09:38:22 -------------------------------------------------------------------------------- Vitals Details Patient Name: Date of Service: Green, Shelley LE H. 12/22/2019 9:00 A  M Medical Record Number: [id number] Patient Account Number: [id number] Date of Birth/Sex: Treating RN: Sep 27, 1950 (69 y.o. [name] Primary Care Lynnelle Mesmer: [name] Other Clinician: Referring Annayah Worthley: Treating Welma Mccombs/Extender: [name] Resides in Treatment: 0 Vital Signs Time Taken: 09:18 Temperature (F): 98.5 Height (in): 62 Pulse (bpm): 70 Source: Stated Respiratory Rate (breaths/min): 18 Weight (lbs): 315 Blood Pressure (mmHg): 189/90 Source: Stated Reference Range: 80 - 120 mg / dl Body Mass Index (BMI): 57.6 Electronic Signature(s) Signed: 01/05/2020 5:50:16 PM By: Yevonne Pax RN Entered By: Yevonne Pax on 12/22/2019 09:20:18

## 2020-01-06 NOTE — Progress Notes (Signed)
[name] ([id number]) Visit Report for 12/22/2019 Abuse/Suicide Risk Screen Details Patient Name: Date of Service: [name] 12/22/2019 9:00 A M Medical Record Number: [id number] Patient Account Number: [id number] Date of Birth/Sex: Treating RN: 02-May-1951 (69 y.o. [name] Primary Care [name]: [name] Other Clinician: Referring [name]: Treating [name] Resides in Treatment: 0 Abuse/Suicide Risk Screen Items Answer ABUSE RISK SCREEN: Has anyone close to you tried to hurt or harm you recentlyo No Do you feel uncomfortable with anyone in your familyo No Has anyone forced you do things that you didnt want to doo No Electronic Signature(s) Signed: 01/05/2020 5:50:16 PM By: Yevonne Pax RN Entered By: Yevonne Pax on 12/22/2019 09:23:02 -------------------------------------------------------------------------------- Activities of Daily Living Details Patient Name: Date of Service: [name] 12/22/2019 9:00 A M Medical Record Number: [id number] Patient Account Number: [id number] Date of Birth/Sex: Treating RN: 1950-07-05 (69 y.o. [name] Primary Care Ashyra Cantin: [name] Other Clinician: Referring [name]: Treating [name] Resides in Treatment: 0 Activities of Daily Living Items Answer Activities of Daily Living (Please select one for each item) Drive Automobile Not Able T Medications ake Completely Able Use T elephone Completely Able Care for Appearance Need Assistance Use T oilet Need Assistance Bath / Shower Need Assistance Dress Self Need Assistance Feed Self Completely Able Walk Need Assistance Get In / Out Bed Need Assistance Housework Not Able Prepare Meals Need Assistance Handle Money Need Assistance Shop for Self Need Assistance Electronic Signature(s) Signed: 01/05/2020 5:50:16 PM By: Yevonne Pax RN Entered By: Yevonne Pax on  12/22/2019 09:24:35 -------------------------------------------------------------------------------- Education Screening Details Patient Name: Date of Service: [name]. 12/22/2019 9:00 A M Medical Record Number: [id number] Patient Account Number: [id number] Date of Birth/Sex: Treating RN: 10-10-1950 (69 y.o. [name] Primary Care Saraann Enneking: [name] Other Clinician: Referring [name] Resides in Treatment: 0 Primary Learner Assessed: Patient Learning Preferences/Education Level/Primary Language Learning Preference: Explanation Highest Education Level: College or Above Preferred Language: English Cognitive Barrier Language Barrier: No Translator Needed: No Memory Deficit: No Emotional Barrier: No Cultural/Religious Beliefs Affecting Medical Care: No Physical Barrier Impaired Vision: Yes Glasses Impaired Hearing: No Decreased Hand dexterity: No Knowledge/Comprehension Knowledge Level: Medium Comprehension Level: High Ability to understand written instructions: High Ability to understand verbal instructions: High Motivation Anxiety Level: Anxious Cooperation: Cooperative Education Importance: Acknowledges Need Interest in Health Problems: Asks Questions Perception: Coherent Willingness to Engage in Self-Management High Activities: Readiness to Engage in Self-Management High Activities: Electronic Signature(s) Signed: 01/05/2020 5:50:16 PM By: Yevonne Pax RN Entered By: Yevonne Pax on 12/22/2019 09:25:06 -------------------------------------------------------------------------------- Fall Risk Assessment Details Patient Name: Date of Service: [name], Shelley LE H. 12/22/2019 9:00 A M Medical Record Number: [id number] Patient Account Number: [id number] Date of Birth/Sex: Treating RN: 07-10-1950 (69 y.o. [name] Primary Care [name]: [name] Other Clinician: Referring  [name]: Treating [name] Resides in Treatment: 0 Fall Risk Assessment Items Have you had 2 or more falls in the last 12 monthso 0 No Have you had any fall that resulted in injury in the last 12 monthso 0 No FALLS RISK SCREEN History of falling - immediate or within 3 months 0 No Secondary diagnosis (Do you have 2 or more medical diagnoseso) 0 No Ambulatory aid None/bed rest/wheelchair/nurse 0 No Crutches/cane/walker 0 No Furniture 0 No Intravenous therapy Access/Saline/Heparin Lock 0 No Gait/Transferring Normal/ bed rest/ wheelchair 0 No Weak (short steps with  or without shuffle, stooped but able to lift head while walking, may seek 0 No support from furniture) Impaired (short steps with shuffle, may have difficulty arising from chair, head down, impaired 0 No balance) Mental Status Oriented to own ability 0 No Electronic Signature(s) Signed: 01/05/2020 5:50:16 PM By: Yevonne Pax RN Entered By: Yevonne Pax on 12/22/2019 09:25:17 -------------------------------------------------------------------------------- Foot Assessment Details Patient Name: Date of Service: [name]. 12/22/2019 9:00 A M Medical Record Number: [id number] Patient Account Number: [id number] Date of Birth/Sex: Treating RN: 1950/07/03 (69 y.o. [name] Primary Care [name] Other Clinician: Referring [name] Resides in Treatment: 0 Foot Assessment Items Site Locations + = Sensation present, - = Sensation absent, C = Callus, U = Ulcer R = Redness, W = Warmth, M = Maceration, PU = Pre-ulcerative lesion F = Fissure, S = Swelling, D = Dryness Assessment Right: Left: Other Deformity: No No Prior Foot Ulcer: No No Prior Amputation: No No Charcot Joint: No No Ambulatory Status: Ambulatory With Help Assistance Device: Walker Gait: Surveyor, mining) Signed:  01/05/2020 5:50:16 PM By: Yevonne Pax RN Entered By: Yevonne Pax on 12/22/2019 09:27:34 -------------------------------------------------------------------------------- Nutrition Risk Screening Details Patient Name: Date of Service: [name] 12/22/2019 9:00 A M Medical Record Number: [id number] Patient Account Number: [id number] Date of Birth/Sex: Treating RN: 1951/01/25 (69 y.o. [name] Primary Care Leightyn Cina: [name] Other Clinician: Referring Aprill Banko: Treating Alyza Artiaga/Extender: [name] Resides in Treatment: 0 Height (in): 62 Weight (lbs): 315 Body Mass Index (BMI): 57.6 Nutrition Risk Screening Items Score Screening NUTRITION RISK SCREEN: I have an illness or condition that made me change the kind and/or amount of food I eat 0 No I eat fewer than two meals per day 0 No I eat few fruits and vegetables, or milk products 0 No I have three or more drinks of beer, liquor or wine almost every day 0 No I have tooth or mouth problems that make it hard for me to eat 0 No I don't always have enough money to buy the food I need 0 No I eat alone most of the time 0 No I take three or more different prescribed or over-the-counter drugs a day 1 Yes Without wanting to, I have lost or gained 10 pounds in the last six months 2 Yes I am not always physically able to shop, cook and/or feed myself 2 Yes Nutrition Protocols Good Risk Protocol Moderate Risk Protocol 0 Provide education on nutrition High Risk Proctocol Risk Level: Moderate Risk Score: 5 Electronic Signature(s) Signed: 01/05/2020 5:50:16 PM By: Yevonne Pax RN Entered By: Yevonne Pax on 12/22/2019 09:25:39

## 2020-01-06 NOTE — Progress Notes (Signed)
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 follow-up with me for wound care and follow-up with Dr. [name] in a monthoos time. READMISSION 08/17/16; the patient returns today having a small open area on the upper third of the original incision on her right lateral ankle. This was initially measured is pinpoint by our intake nurse. It is and roughly the same spot as her previous wound in this area but much smaller. Patient does not have any additional insight as to what has caused this. She states that she was working with her physical therapist who noticed the open area. There is really been no drainage, no pain. She has noted increasing amounts of lymphedema and swelling. ABIs in this clinic at 1.17. I don't see any previous cultures of this wound area 08/21/16; surprisingly the culture was negative on the small draining area on the original incision. She still has a small probing area of about 0.9 cm with purulent drainage. I'm going to want her to continue the antibiotics. I think we can go ahead and wrap her more aggressively today. She saw Dr. [name] and apparently her x-rays were stable 08/28/16; surprisingly marked improvement in depth currently 0.3 cm. This is down from 0.9 cm last week. There is no evidence of surrounding infection and no drainage. She has chronic venous insufficiency was secondary lymphedema her edema is under excellent control. 09/03/16; down 2.2 cm in depth almost surprising improvement. She has chronic venous insufficiency was secondary  lymphedema although her edema is under good control. The rest of the incision here appears to be in stable condition there is no drainage no tenderness 09/10/16; the patient's original wound is fully closed in which was a small wound with depth. She does however have an excoriated area that isn't fully epithelialized. This is very superficial. She is also asking me about why she is so edematous and why she is on 40 mg of Lasix. Apparently the Lasix was originally prescribed by Dr. Susette Racer of [facility]. 09/17/16; the patient's incisional wounds are totally closed there is no open area. She continues to have poorly controlled edema which I research last time. At least in terms of her lower extremities I think this is partially venous insufficiency and lymphedema. She had zippered stockings but expresses her extreme reluctance to use these as they're too tight and difficult to put on. We gave her the name of elastic therapy and [place] as a good place  to look at affordable reasonable quality stockings. I am doubtful she will follow-up on this READMISSION 07/30/2018 This is a patient that we had in clinic here in 2017 in 2018 with wounds on her right lateral ankle in the setting of previous surgery for right trimalleolar fractures. She had chronic venous insufficiency and lymphedema noted at that point in time. She is here for review and follow-up of her problems with her lower extremity lymphedema right greater than left. She says she has noted increased swelling in the right leg greater than the left to the point where she can no longer get her stockings on which are some form of standard over the toe stocking. She purchased some form of compression pump on [employer] but I think it only goes up to her mid calf by her description. And indicates she has not really been using the seat either because she was scared about the leaking edema she has coming out of the lateral part of her right leg. She says it  is puritic but not painful. She does not have an open wound in this area and does not have a history of lower extremity wounds which looking at her right leg especially is indeed fortunate. 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 -------------------------------------------- 12/22/2019- 77 year old morbidly obese female with OSA, diastolic heart failure, history of ICA aneurysm repair, seizure disorder, chronic venous insufficiency- return to clinic with increased swelling over the past 2 months, does attend the lymphedema clinic, was given external pumps and has been using it once a day for the past 2 months, despite which swelling in her legs has been increasing, she has developed stasis dermatitis rash with itching and tends to scratch quite a bit, has been applying AandE to both legs daily. Developed open area on the right lateral aspect of her right leg recently. Denies any fevers or chills ABIs were unobtainable due to significant edema but pulses are palpable Patient History Information obtained from Patient. Allergies codeine (Reaction: nausea), iodinated diagnostic tests, Compazine (Severity: Moderate, Reaction: SOB) Family History Cancer - Mother,Father, Heart Disease - Mother, Stroke, Thyroid Problems - Mother, No family history of Diabetes, Hereditary Spherocytosis, Hypertension, Kidney Disease, Lung Disease, Seizures, Tuberculosis. Social History Former smoker, Marital Status - Married, Alcohol Use - Rarely, Drug Use - No History, Caffeine Use - Daily - coke. Medical History Eyes Denies history of Cataracts, Glaucoma, Optic Neuritis Ear/Nose/Mouth/Throat Denies history of Chronic sinus problems/congestion, Middle ear problems Hematologic/Lymphatic Patient has history of Anemia, Lymphedema Denies history of Hemophilia, Human Immunodeficiency Virus,  Sickle Cell Disease Respiratory Patient has history of Sleep Apnea - CPAP Denies history of Aspiration, Asthma, Chronic Obstructive Pulmonary Disease (COPD), Pneumothorax, Tuberculosis Cardiovascular Patient has history of Hypertension Denies history of Angina, Arrhythmia, Congestive Heart Failure, Coronary Artery Disease, Deep Vein Thrombosis, Hypotension, Myocardial Infarction, Peripheral Arterial Disease, Peripheral Venous Disease, Phlebitis, Vasculitis Gastrointestinal Denies history of Cirrhosis , Colitis, Crohnoos, Hepatitis A, Hepatitis B, Hepatitis C Endocrine Denies history of Type I Diabetes, Type II Diabetes Genitourinary Denies history of End Stage Renal Disease Immunological Denies history of Lupus Erythematosus, Raynaudoos, Scleroderma Integumentary (Skin) Denies history of History of Burn Neurologic Patient has history of Seizure Disorder - 1992 Denies history of Dementia, Neuropathy, Quadriplegia, Paraplegia Hospitalization/Surgery History - ankle repair. - hammer toe repair. Medical A Surgical History Notes nd Constitutional Symptoms (General Health) restless leg , bilat lower ext edema Musculoskeletal closed (R) ankle fx  Review of Systems (ROS) Constitutional Symptoms (General Health) Denies complaints or symptoms of Fatigue, Fever, Chills, Marked Weight Change. Eyes Denies complaints or symptoms of Dry Eyes, Vision Changes, Glasses / Contacts. Ear/Nose/Mouth/Throat Denies complaints or symptoms of Chronic sinus problems or rhinitis. Respiratory Denies complaints or symptoms of Chronic or frequent coughs, Shortness of Breath. Cardiovascular Denies complaints or symptoms of Chest pain. Gastrointestinal Denies complaints or symptoms of Frequent diarrhea, Nausea, Vomiting. Endocrine Denies complaints or symptoms of Heat/cold intolerance. Genitourinary Denies complaints or symptoms of Frequent urination. Integumentary (Skin) Complains or has symptoms of  Wounds. Musculoskeletal Denies complaints or symptoms of Muscle Pain, Muscle Weakness. Neurologic Denies complaints or symptoms of Numbness/parasthesias. Psychiatric Denies complaints or symptoms of Claustrophobia, Suicidal. Objective Constitutional alert and oriented x 3. sitting or standing blood pressure is within target range for patient.. supine blood pressure is within target range for patient.. pulse regular and within target range for patient.[name] respirations regular, non-labored and within target range for patient.[name] temperature within target range for patient.. Well- nourished and well-hydrated in no acute distress. Vitals Time Taken: 9:18 AM, Height: 62 in, Source: Stated, Weight: 315 lbs, Source: Stated, BMI: 57.6, Temperature: 98.5 F, Pulse: 70 bpm, Respiratory Rate: 18 breaths/min, Blood Pressure: 189/90 mmHg. Eyes conjunctiva clear no eyelid edema noted. pupils equal round and reactive to light and accommodation. Ears, Nose, Mouth, and Throat no gross abnormality of ear auricles or external auditory canals. normal hearing noted during conversation. mucus membranes moist. Neck supple with no LAD noted in anterior or posterior cervical chain. not enlarged. Respiratory normal breathing without difficulty. clear to auscultation bilaterally. Cardiovascular regular rate and rhythm with normal S1, S2. 2+ dorsalis pedis/posterior tibialis pulses. no clubbing, cyanosis, + significant edema, Gastrointestinal (GI) soft, non-tender, non-distended, +BS. no hepatosplenomegaly. no ventral hernia noted. Lymphatic no cervical lymphadenopathy. no axillary lymphadenopathy. no inguinal lymphadenopathy. Musculoskeletal normal gait and posture. no significant deformity or arthritic changes, no loss or range of motion, no clubbing. full range of motion without deformity. Neurological cranial nerves 2-12 intact. Patient has normal sensation in the feet bilaterally to light  touch. Psychiatric this patient is able to make decisions and demonstrates good insight into disease process. Alert and Oriented x 3. pleasant and cooperative. General Notes: Bilateral lower extremity lymphedema, with stasis dermatitis and changes, nodular appearance of both legs, with excoriations on the left more than right where she has been scratching Right lateral calf open area which is shallow skin depth. Integumentary (Hair, Skin) normal hair distribution and pattern. skin pink, warm, dry. Wound #6 status is Open. Original cause of wound was Gradually Appeared. The wound is located on the Right,Lateral Lower Leg. The wound measures 7.5cm length x 3.4cm width x 0.1cm depth; 20.028cm^2 area and 2.003cm^3 volume. There is Fat Layer (Subcutaneous Tissue) Exposed exposed. There is no tunneling or undermining noted. There is a medium amount of serosanguineous drainage noted. There is medium (34-66%) pink granulation within the wound bed. There is a medium (34-66%) amount of necrotic tissue within the wound bed including Adherent Slough. Assessment Active Problems ICD-10 Non-pressure chronic ulcer of right calf limited to breakdown of skin Lymphedema, not elsewhere classified Unspecified diastolic (congestive) heart failure Obstructive sleep apnea (adult) (pediatric) Procedures Wound #6 Pre-procedure diagnosis of Wound #6 is a Lymphedema located on the Right,Lateral Lower Leg . There was a Three Layer Compression Therapy Procedure by Zenaida Deed, RN. Post procedure Diagnosis Wound #6: Same as Pre-Procedure There was a Three Layer Compression Therapy Procedure by Zenaida Deed, RN. Post  procedure Diagnosis Wound #: Same as Pre-Procedure Plan Follow-up Appointments: Return Appointment in 1 week. Dressing Change Frequency: Do not change entire dressing for one week. Skin Barriers/Peri-Wound Care: TCA Cream or Ointment Wound Cleansing: May shower with protection. - cast  protector Primary Wound Dressing: Calcium Alginate with Silver Secondary Dressing: ABD pad Zetuvit or Kerramax Edema Control: 3 Layer Compression System - Bilateral Elevate legs to the level of the heart or above for 30 minutes daily and/or when sitting, a frequency of: Exercise regularly Segmental Compressive Device. - Lymphedema Pumps, use twice a day for an hour each > Initiate dressing silver alginate, Periwound care TCA plus barrier cream, Compression 3 layer compression >Off loading by elevation of lower extremity >Elevate Extremity of concern above level of heart frequently, utilize pumps 3 times daily, >Emphasized Maintain nutrition balance with protein intake > 50 g per day >Return to clinic next week Electronic Signature(s) Signed: 12/22/2019 10:10:19 AM By: Cassandria Anger MD, MBA Entered By: Cassandria Anger on 12/22/2019 10:10:19 -------------------------------------------------------------------------------- HxROS Details Patient Name: Date of Service: [name], Shelley LE H. 12/22/2019 9:00 A M Medical Record Number: [id number] Patient Account Number: [id number] Date of Birth/Sex: Treating RN: Nov 13, 1950 (69 y.o. [name] Primary Care Provider: [name] Other Clinician: Referring Provider: Treating Provider/Extender: [name] Resides in Treatment: 0 Information Obtained From Patient Constitutional Symptoms (General Health) Complaints and Symptoms: Negative for: Fatigue; Fever; Chills; Marked Weight Change Medical History: Past Medical History Notes: restless leg , bilat lower ext edema Eyes Complaints and Symptoms: Negative for: Dry Eyes; Vision Changes; Glasses / Contacts Medical History: Negative for: Cataracts; Glaucoma; Optic Neuritis Ear/Nose/Mouth/Throat Complaints and Symptoms: Negative for: Chronic sinus problems or rhinitis Medical History: Negative for: Chronic sinus problems/congestion; Middle ear  problems Respiratory Complaints and Symptoms: Negative for: Chronic or frequent coughs; Shortness of Breath Medical History: Positive for: Sleep Apnea - CPAP Negative for: Aspiration; Asthma; Chronic Obstructive Pulmonary Disease (COPD); Pneumothorax; Tuberculosis Cardiovascular Complaints and Symptoms: Negative for: Chest pain Medical History: Positive for: Hypertension Negative for: Angina; Arrhythmia; Congestive Heart Failure; Coronary Artery Disease; Deep Vein Thrombosis; Hypotension; Myocardial Infarction; Peripheral Arterial Disease; Peripheral Venous Disease; Phlebitis; Vasculitis Gastrointestinal Complaints and Symptoms: Negative for: Frequent diarrhea; Nausea; Vomiting Medical History: Negative for: Cirrhosis ; Colitis; Crohns; Hepatitis A; Hepatitis B; Hepatitis C Endocrine Complaints and Symptoms: Negative for: Heat/cold intolerance Medical History: Negative for: Type I Diabetes; Type II Diabetes Genitourinary Complaints and Symptoms: Negative for: Frequent urination Medical History: Negative for: End Stage Renal Disease Integumentary (Skin) Complaints and Symptoms: Positive for: Wounds Medical History: Negative for: History of Burn Musculoskeletal Complaints and Symptoms: Negative for: Muscle Pain; Muscle Weakness Medical History: Past Medical History Notes: closed (R) ankle fx Neurologic Complaints and Symptoms: Negative for: Numbness/parasthesias Medical History: Positive for: Seizure Disorder - 1992 Negative for: Dementia; Neuropathy; Quadriplegia; Paraplegia Psychiatric Complaints and Symptoms: Negative for: Claustrophobia; Suicidal Hematologic/Lymphatic Medical History: Positive for: Anemia; Lymphedema Negative for: Hemophilia; Human Immunodeficiency Virus; Sickle Cell Disease Immunological Medical History: Negative for: Lupus Erythematosus; Raynauds; Scleroderma Oncologic Immunizations Pneumococcal Vaccine: Received Pneumococcal  Vaccination: No Implantable Devices None Hospitalization / Surgery History Type of Hospitalization/Surgery ankle repair hammer toe repair Family and Social History Cancer: Yes - Mother,Father; Diabetes: No; Heart Disease: Yes - Mother; Hereditary Spherocytosis: No; Hypertension: No; Kidney Disease: No; Lung Disease: No; Seizures: No; Stroke: Yes; Thyroid Problems: Yes - Mother; Tuberculosis: No; Former smoker; Marital Status - Married; Alcohol Use: Rarely; Drug Use: No History; Caffeine Use: Daily - coke; Financial Concerns: No; Food,  Clothing or Shelter Needs: No; Support System Lacking: No; Transportation Concerns: No Psychologist, prison and probation services) Signed: 12/22/2019 9:39:52 AM By: Cassandria Anger MD, MBA Signed: 01/05/2020 5:50:16 PM By: Yevonne Pax RN Entered By: Yevonne Pax on 12/22/2019 09:21:35 -------------------------------------------------------------------------------- SuperBill Details Patient Name: Date of Service: Carlis Abbott 12/22/2019 Medical Record Number: [id number] Patient Account Number: [id number] Date of Birth/Sex: Treating RN: [birth year] (69 y.o. Harvest Dark Primary Care Provider: [name] Other Clinician: Referring Provider: Treating Provider/Extender: [name] Resides in Treatment: 0 Diagnosis Coding ICD-10 Codes Code Description [phone] Non-pressure chronic ulcer of right calf limited to breakdown of skin I89.0 Lymphedema, not elsewhere classified I50.30 Unspecified diastolic (congestive) heart failure G47.33 Obstructive sleep apnea (adult) (pediatric) Facility Procedures CPT4: Code [id number] Description: 13 - WOUND CARE VISIT-LEV 3 EST PT Modifier: 25 Quantity: 1 CPT4: [id number] Description: 81 BILATERAL: Application of multi-layer venous compression system; leg (below knee), including ankle and t. Modifier: Quantity: 1 Physician Procedures : CPT4 Code Description Modifier [id number] - WC PHYS  LEVEL 4 - NEW PT ICD-10 Diagnosis Description L97.211 Non-pressure chronic ulcer of right calf limited to breakdown of skin Quantity: 1 Electronic Signature(s) Signed: 12/22/2019 4:53:28 PM By: Cherylin Mylar Signed: 12/22/2019 4:53:36 PM By: Cassandria Anger MD, MBA Previous Signature: 12/22/2019 10:10:32 AM Version By: Cassandria Anger MD, MBA Entered By: Cherylin Mylar on 12/22/2019 10:22:53

## 2020-01-10 DIAGNOSIS — M722 Plantar fascial fibromatosis: Secondary | ICD-10-CM | POA: Diagnosis not present

## 2020-01-16 DIAGNOSIS — M722 Plantar fascial fibromatosis: Secondary | ICD-10-CM | POA: Diagnosis not present

## 2020-01-19 DIAGNOSIS — M722 Plantar fascial fibromatosis: Secondary | ICD-10-CM | POA: Diagnosis not present

## 2020-01-23 DIAGNOSIS — M722 Plantar fascial fibromatosis: Secondary | ICD-10-CM | POA: Diagnosis not present

## 2020-01-25 DIAGNOSIS — M722 Plantar fascial fibromatosis: Secondary | ICD-10-CM | POA: Diagnosis not present

## 2020-02-06 DIAGNOSIS — M722 Plantar fascial fibromatosis: Secondary | ICD-10-CM | POA: Diagnosis not present

## 2020-02-08 DIAGNOSIS — M722 Plantar fascial fibromatosis: Secondary | ICD-10-CM | POA: Diagnosis not present

## 2020-02-14 DIAGNOSIS — M722 Plantar fascial fibromatosis: Secondary | ICD-10-CM | POA: Diagnosis not present

## 2020-02-16 ENCOUNTER — Other Ambulatory Visit: Payer: Self-pay | Admitting: Adult Health

## 2020-02-16 DIAGNOSIS — G2581 Restless legs syndrome: Secondary | ICD-10-CM

## 2020-02-16 DIAGNOSIS — M722 Plantar fascial fibromatosis: Secondary | ICD-10-CM | POA: Diagnosis not present

## 2020-02-25 ENCOUNTER — Other Ambulatory Visit: Payer: Self-pay | Admitting: Adult Health

## 2020-02-26 DIAGNOSIS — M47816 Spondylosis without myelopathy or radiculopathy, lumbar region: Secondary | ICD-10-CM | POA: Diagnosis not present

## 2020-02-26 DIAGNOSIS — M47817 Spondylosis without myelopathy or radiculopathy, lumbosacral region: Secondary | ICD-10-CM | POA: Diagnosis not present

## 2020-02-26 DIAGNOSIS — M47896 Other spondylosis, lumbar region: Secondary | ICD-10-CM | POA: Diagnosis not present

## 2020-03-13 ENCOUNTER — Other Ambulatory Visit: Payer: Self-pay | Admitting: Chiropractic Medicine

## 2020-03-13 DIAGNOSIS — M47896 Other spondylosis, lumbar region: Secondary | ICD-10-CM

## 2020-03-27 ENCOUNTER — Ambulatory Visit
Admission: RE | Admit: 2020-03-27 | Discharge: 2020-03-27 | Disposition: A | Discharge status: Home / Self Care | Payer: Medicare PPO | Source: Ambulatory Visit | Attending: Chiropractic Medicine | Admitting: Chiropractic Medicine

## 2020-03-27 DIAGNOSIS — H524 Presbyopia: Secondary | ICD-10-CM | POA: Diagnosis not present

## 2020-03-27 DIAGNOSIS — M48061 Spinal stenosis, lumbar region without neurogenic claudication: Secondary | ICD-10-CM | POA: Diagnosis not present

## 2020-03-27 DIAGNOSIS — M47896 Other spondylosis, lumbar region: Secondary | ICD-10-CM

## 2020-03-27 DIAGNOSIS — H52223 Regular astigmatism, bilateral: Secondary | ICD-10-CM | POA: Diagnosis not present

## 2020-03-27 DIAGNOSIS — I10 Essential (primary) hypertension: Secondary | ICD-10-CM | POA: Diagnosis not present

## 2020-03-27 DIAGNOSIS — M47816 Spondylosis without myelopathy or radiculopathy, lumbar region: Secondary | ICD-10-CM | POA: Diagnosis not present

## 2020-03-27 DIAGNOSIS — M5126 Other intervertebral disc displacement, lumbar region: Secondary | ICD-10-CM | POA: Diagnosis not present

## 2020-03-27 DIAGNOSIS — Z961 Presence of intraocular lens: Secondary | ICD-10-CM | POA: Diagnosis not present

## 2020-03-27 DIAGNOSIS — H35033 Hypertensive retinopathy, bilateral: Secondary | ICD-10-CM | POA: Diagnosis not present

## 2020-03-27 DIAGNOSIS — H5203 Hypermetropia, bilateral: Secondary | ICD-10-CM | POA: Diagnosis not present

## 2020-03-27 DIAGNOSIS — M533 Sacrococcygeal disorders, not elsewhere classified: Secondary | ICD-10-CM | POA: Diagnosis not present

## 2020-03-31 ENCOUNTER — Other Ambulatory Visit: Payer: Self-pay | Admitting: Adult Health

## 2020-04-17 DIAGNOSIS — M47816 Spondylosis without myelopathy or radiculopathy, lumbar region: Secondary | ICD-10-CM | POA: Diagnosis not present

## 2020-04-17 DIAGNOSIS — Z1231 Encounter for screening mammogram for malignant neoplasm of breast: Secondary | ICD-10-CM | POA: Diagnosis not present

## 2020-04-17 DIAGNOSIS — M722 Plantar fascial fibromatosis: Secondary | ICD-10-CM | POA: Diagnosis not present

## 2020-04-17 DIAGNOSIS — I890 Lymphedema, not elsewhere classified: Secondary | ICD-10-CM | POA: Diagnosis not present

## 2020-04-26 ENCOUNTER — Other Ambulatory Visit: Payer: Self-pay | Admitting: Chiropractic Medicine

## 2020-04-26 ENCOUNTER — Other Ambulatory Visit: Payer: Self-pay

## 2020-04-26 ENCOUNTER — Telehealth: Payer: Self-pay

## 2020-04-26 DIAGNOSIS — M47896 Other spondylosis, lumbar region: Secondary | ICD-10-CM

## 2020-04-26 MED ORDER — PREDNISONE 50 MG PO TABS: ORAL_TABLET | ORAL | 0 refills | Status: DC

## 2020-04-26 NOTE — Telephone Encounter (Signed)
Phone call to patient to review instructions for 13 hr prep for facet injections contrast on 12/21/21at 9am. Prescription called into [facility] [address] [employer]. Pt aware and verbalized understanding of instructions. Prescription: 04/29/20 @ 8pm- 50mg  Prednisone 04/30/20 @ 2am- 50mg  Prednisone 04/30/20@ 8am - 50mg  Prednisone and 50mg  Benadryl

## 2020-04-30 ENCOUNTER — Ambulatory Visit
Admission: RE | Admit: 2020-04-30 | Discharge: 2020-04-30 | Disposition: A | Discharge status: Home / Self Care | Payer: Medicare PPO | Source: Ambulatory Visit | Attending: Chiropractic Medicine | Admitting: Chiropractic Medicine

## 2020-04-30 ENCOUNTER — Other Ambulatory Visit: Payer: Self-pay | Admitting: Chiropractic Medicine

## 2020-04-30 DIAGNOSIS — M47896 Other spondylosis, lumbar region: Secondary | ICD-10-CM

## 2020-04-30 DIAGNOSIS — M47817 Spondylosis without myelopathy or radiculopathy, lumbosacral region: Secondary | ICD-10-CM | POA: Diagnosis not present

## 2020-04-30 MED ORDER — METHYLPREDNISOLONE ACETATE 40 MG/ML INJ SUSP (RADIOLOG
120.0000 mg | Freq: Once | INTRAMUSCULAR | Status: AC
  Administered 2020-04-30: 120 mg via INTRA_ARTICULAR

## 2020-04-30 MED ORDER — IOPAMIDOL (ISOVUE-M 200) INJECTION 41%
1.0000 mL | Freq: Once | INTRAMUSCULAR | Status: AC
  Administered 2020-04-30: 1 mL via INTRA_ARTICULAR

## 2020-04-30 NOTE — Discharge Instructions (Signed)

## 2020-05-17 ENCOUNTER — Other Ambulatory Visit: Payer: Self-pay | Admitting: Adult Health

## 2020-05-17 DIAGNOSIS — G2581 Restless legs syndrome: Secondary | ICD-10-CM

## 2020-06-26 DIAGNOSIS — I8312 Varicose veins of left lower extremity with inflammation: Secondary | ICD-10-CM | POA: Diagnosis not present

## 2020-06-26 DIAGNOSIS — L820 Inflamed seborrheic keratosis: Secondary | ICD-10-CM | POA: Diagnosis not present

## 2020-06-26 DIAGNOSIS — L0889 Other specified local infections of the skin and subcutaneous tissue: Secondary | ICD-10-CM | POA: Diagnosis not present

## 2020-06-26 DIAGNOSIS — I8311 Varicose veins of right lower extremity with inflammation: Secondary | ICD-10-CM | POA: Diagnosis not present

## 2020-06-26 DIAGNOSIS — I872 Venous insufficiency (chronic) (peripheral): Secondary | ICD-10-CM | POA: Diagnosis not present

## 2020-06-26 DIAGNOSIS — L309 Dermatitis, unspecified: Secondary | ICD-10-CM | POA: Diagnosis not present

## 2020-07-09 DIAGNOSIS — I8312 Varicose veins of left lower extremity with inflammation: Secondary | ICD-10-CM | POA: Diagnosis not present

## 2020-07-09 DIAGNOSIS — L308 Other specified dermatitis: Secondary | ICD-10-CM | POA: Diagnosis not present

## 2020-07-09 DIAGNOSIS — I872 Venous insufficiency (chronic) (peripheral): Secondary | ICD-10-CM | POA: Diagnosis not present

## 2020-07-09 DIAGNOSIS — I8311 Varicose veins of right lower extremity with inflammation: Secondary | ICD-10-CM | POA: Diagnosis not present

## 2020-07-10 ENCOUNTER — Ambulatory Visit: Payer: Medicare PPO | Admitting: Adult Health

## 2020-07-17 DIAGNOSIS — G40309 Generalized idiopathic epilepsy and epileptic syndromes, not intractable, without status epilepticus: Secondary | ICD-10-CM | POA: Diagnosis not present

## 2020-07-17 DIAGNOSIS — D692 Other nonthrombocytopenic purpura: Secondary | ICD-10-CM | POA: Diagnosis not present

## 2020-07-17 DIAGNOSIS — E559 Vitamin D deficiency, unspecified: Secondary | ICD-10-CM | POA: Diagnosis not present

## 2020-07-17 DIAGNOSIS — Z79899 Other long term (current) drug therapy: Secondary | ICD-10-CM | POA: Diagnosis not present

## 2020-07-17 DIAGNOSIS — Z0000 Encounter for general adult medical examination without abnormal findings: Secondary | ICD-10-CM | POA: Diagnosis not present

## 2020-07-17 DIAGNOSIS — F330 Major depressive disorder, recurrent, mild: Secondary | ICD-10-CM | POA: Diagnosis not present

## 2020-07-17 DIAGNOSIS — E7800 Pure hypercholesterolemia, unspecified: Secondary | ICD-10-CM | POA: Diagnosis not present

## 2020-07-17 DIAGNOSIS — I10 Essential (primary) hypertension: Secondary | ICD-10-CM | POA: Diagnosis not present

## 2020-07-22 ENCOUNTER — Other Ambulatory Visit: Payer: Self-pay | Admitting: Adult Health

## 2020-07-22 ENCOUNTER — Telehealth: Payer: Self-pay | Admitting: Neurology

## 2020-07-22 ENCOUNTER — Telehealth: Payer: Self-pay | Admitting: Adult Health

## 2020-07-22 MED ORDER — HORIZANT 600 MG PO TBCR: EXTENDED_RELEASE_TABLET | ORAL | 1 refills | Status: DC

## 2020-07-22 NOTE — Telephone Encounter (Signed)
PA completed through cover my meds/[employer] KEY: [id number] PA Case: [id number], Status: Approved, Coverage Starts on: 05/11/2020 12:00:00 AM, Coverage Ends on: 05/10/2021 12:00:00 AM.

## 2020-07-22 NOTE — Telephone Encounter (Signed)
Pt is needing a refill on her HORIZANT 600 MG TBCR sent in to the [facility] on [address].

## 2020-07-23 NOTE — Telephone Encounter (Signed)
Advised patient that he can only do Horizant 1 tablet 600 mg at bedtime unless we switch him to regular gabapentin

## 2020-07-24 DIAGNOSIS — I8311 Varicose veins of right lower extremity with inflammation: Secondary | ICD-10-CM | POA: Diagnosis not present

## 2020-07-24 DIAGNOSIS — I8312 Varicose veins of left lower extremity with inflammation: Secondary | ICD-10-CM | POA: Diagnosis not present

## 2020-07-24 DIAGNOSIS — L309 Dermatitis, unspecified: Secondary | ICD-10-CM | POA: Diagnosis not present

## 2020-07-24 DIAGNOSIS — I872 Venous insufficiency (chronic) (peripheral): Secondary | ICD-10-CM | POA: Diagnosis not present

## 2020-07-26 NOTE — Telephone Encounter (Signed)
Pt called back needing to discuss this medication with the RN or Provider and the ml she is to take. Please advise.

## 2020-07-29 NOTE — Telephone Encounter (Signed)
I attempted to reach the pt, vm was not set up. Unable to leave a vm. Will try again at a later time.

## 2020-07-30 ENCOUNTER — Telehealth: Payer: Self-pay | Admitting: Adult Health

## 2020-07-30 MED ORDER — GABAPENTIN ENACARBIL ER 300 MG PO TBCR: 900.0000 mg | EXTENDED_RELEASE_TABLET | Freq: Every day | ORAL | 1 refills | Status: DC

## 2020-07-30 NOTE — Telephone Encounter (Signed)
Pt called, my insurance has approved Gabapentin Enacarbil (HORIZANT) 600 MG TBCR. What is my dosage for Horizant. Would like a call from the nurse.

## 2020-07-30 NOTE — Telephone Encounter (Signed)
What does he want to do? Take Gabapentin or Horizant?

## 2020-07-30 NOTE — Addendum Note (Signed)
Addended by: Guy Begin on: 07/30/2020 03:27 PM   Modules accepted: Orders

## 2020-07-30 NOTE — Telephone Encounter (Signed)
Called and LMVM for pt that sent in another prescription for the horizant 300mg  tab (take 3 tabs daily). (this will be like taking the 600mg  tabs (1.5 tabs daily).  It was approved by insurance for 600mg  tabs #150 (90 day supply).  Hopefully that will work).  Only issue is that pharmacy did not have the 300mg  tabs, have to order.  So wanted to let her know.  She can call me back if questions.

## 2020-07-30 NOTE — Telephone Encounter (Addendum)
Home # VM full, LMVM mobile that ? On horizant (how taking)?

## 2020-08-01 MED ORDER — GABAPENTIN ENACARBIL ER 600 MG PO TBCR: 600.0000 mg | EXTENDED_RELEASE_TABLET | Freq: Every day | ORAL | 0 refills | Status: DC

## 2020-08-01 NOTE — Telephone Encounter (Signed)
LMVM for pt

## 2020-08-01 NOTE — Addendum Note (Signed)
Addended by: Guy Begin on: 08/01/2020 12:09 PM   Modules accepted: Orders

## 2020-08-01 NOTE — Telephone Encounter (Signed)
Pt called and insurance not approving the 300mg  tabs needs PA.  Pt has been out to medication.  I sent in new prescription for her to take 600mg  po daily. Until PA for 300mg  tabs.  Pharmacy stated needed new prescription for 600mg  tabs.  Done.  They will call pt.

## 2020-08-05 ENCOUNTER — Ambulatory Visit: Payer: Medicare PPO | Admitting: Adult Health

## 2020-08-05 VITALS — BP 170/70 | HR 79 | Ht 60.0 in | Wt 298.0 lb

## 2020-08-05 DIAGNOSIS — Z9989 Dependence on other enabling machines and devices: Secondary | ICD-10-CM | POA: Diagnosis not present

## 2020-08-05 DIAGNOSIS — G2581 Restless legs syndrome: Secondary | ICD-10-CM | POA: Diagnosis not present

## 2020-08-05 DIAGNOSIS — R569 Unspecified convulsions: Secondary | ICD-10-CM

## 2020-08-05 DIAGNOSIS — G4733 Obstructive sleep apnea (adult) (pediatric): Secondary | ICD-10-CM

## 2020-08-05 MED ORDER — PRAMIPEXOLE DIHYDROCHLORIDE 0.25 MG PO TABS: ORAL_TABLET | ORAL | 1 refills | Status: DC

## 2020-08-05 MED ORDER — GABAPENTIN ENACARBIL ER 600 MG PO TBCR: 600.0000 mg | EXTENDED_RELEASE_TABLET | Freq: Every day | ORAL | 3 refills | Status: DC

## 2020-08-05 MED ORDER — PHENOBARBITAL 64.8 MG PO TABS: 129.6000 mg | ORAL_TABLET | Freq: Every day | ORAL | 5 refills | Status: DC

## 2020-08-05 MED ORDER — PRAMIPEXOLE DIHYDROCHLORIDE ER 0.75 MG PO TB24: 1.0000 | ORAL_TABLET | Freq: Every day | ORAL | 3 refills | Status: DC

## 2020-08-05 NOTE — Patient Instructions (Signed)
Your Plan:  Continue Phenobarbital  Continue Mirapex 0.25 1-2 tablets at 6PM, and 0.75 at bedtime Continue Horizant 600 mg at bedtime If your symptoms worsen or you develop new symptoms please let us know.    Thank you for coming to see [place] at [facility]. I hope we have been able to provide you high quality care today.  You may receive a patient satisfaction survey over the next few weeks. We would appreciate your feedback and comments so that we may continue to improve ourselves and the health of our patients.

## 2020-08-05 NOTE — Telephone Encounter (Signed)
PA for 300mg  Horizant CMM KEY # .  Determination pending.
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
## 2020-08-06 NOTE — Telephone Encounter (Signed)
PA for 300mg  Horizant tablets good until 05-10-2021 ID 05-12-2021.  Pt was seen today for appt.  Decided to continue on 600mg  Horizant tabs at this time.

## 2020-08-21 DIAGNOSIS — I8312 Varicose veins of left lower extremity with inflammation: Secondary | ICD-10-CM | POA: Diagnosis not present

## 2020-08-21 DIAGNOSIS — B078 Other viral warts: Secondary | ICD-10-CM | POA: Diagnosis not present

## 2020-08-21 DIAGNOSIS — L308 Other specified dermatitis: Secondary | ICD-10-CM | POA: Diagnosis not present

## 2020-08-21 DIAGNOSIS — I872 Venous insufficiency (chronic) (peripheral): Secondary | ICD-10-CM | POA: Diagnosis not present

## 2020-08-21 DIAGNOSIS — I8311 Varicose veins of right lower extremity with inflammation: Secondary | ICD-10-CM | POA: Diagnosis not present

## 2020-08-22 ENCOUNTER — Other Ambulatory Visit: Payer: Self-pay | Admitting: Cardiology

## 2020-09-14 ENCOUNTER — Other Ambulatory Visit: Payer: Self-pay | Admitting: Cardiology

## 2020-10-15 DIAGNOSIS — I872 Venous insufficiency (chronic) (peripheral): Secondary | ICD-10-CM | POA: Diagnosis not present

## 2020-10-15 DIAGNOSIS — I8312 Varicose veins of left lower extremity with inflammation: Secondary | ICD-10-CM | POA: Diagnosis not present

## 2020-10-15 DIAGNOSIS — I8311 Varicose veins of right lower extremity with inflammation: Secondary | ICD-10-CM | POA: Diagnosis not present

## 2020-10-15 DIAGNOSIS — L570 Actinic keratosis: Secondary | ICD-10-CM | POA: Diagnosis not present

## 2020-10-15 DIAGNOSIS — L308 Other specified dermatitis: Secondary | ICD-10-CM | POA: Diagnosis not present

## 2020-11-16 DIAGNOSIS — Z6841 Body Mass Index (BMI) 40.0 and over, adult: Secondary | ICD-10-CM | POA: Diagnosis not present

## 2020-11-16 DIAGNOSIS — M1611 Unilateral primary osteoarthritis, right hip: Secondary | ICD-10-CM | POA: Diagnosis not present

## 2020-11-16 DIAGNOSIS — M47816 Spondylosis without myelopathy or radiculopathy, lumbar region: Secondary | ICD-10-CM | POA: Diagnosis not present

## 2020-11-16 DIAGNOSIS — M47896 Other spondylosis, lumbar region: Secondary | ICD-10-CM | POA: Diagnosis not present

## 2020-11-18 ENCOUNTER — Other Ambulatory Visit: Payer: Self-pay | Admitting: Physical Medicine and Rehabilitation

## 2020-11-18 DIAGNOSIS — L03011 Cellulitis of right finger: Secondary | ICD-10-CM | POA: Diagnosis not present

## 2020-11-18 DIAGNOSIS — M1611 Unilateral primary osteoarthritis, right hip: Secondary | ICD-10-CM

## 2020-11-18 DIAGNOSIS — I8311 Varicose veins of right lower extremity with inflammation: Secondary | ICD-10-CM | POA: Diagnosis not present

## 2020-11-18 DIAGNOSIS — I872 Venous insufficiency (chronic) (peripheral): Secondary | ICD-10-CM | POA: Diagnosis not present

## 2020-11-18 DIAGNOSIS — L308 Other specified dermatitis: Secondary | ICD-10-CM | POA: Diagnosis not present

## 2020-11-18 DIAGNOSIS — I8312 Varicose veins of left lower extremity with inflammation: Secondary | ICD-10-CM | POA: Diagnosis not present

## 2020-11-19 ENCOUNTER — Other Ambulatory Visit: Payer: Self-pay

## 2020-11-19 ENCOUNTER — Ambulatory Visit
Admission: RE | Admit: 2020-11-19 | Discharge: 2020-11-19 | Disposition: A | Discharge status: Home / Self Care | Payer: Medicare PPO | Source: Ambulatory Visit | Attending: Physical Medicine and Rehabilitation | Admitting: Physical Medicine and Rehabilitation

## 2020-11-19 DIAGNOSIS — G8929 Other chronic pain: Secondary | ICD-10-CM | POA: Diagnosis not present

## 2020-11-19 DIAGNOSIS — M1611 Unilateral primary osteoarthritis, right hip: Secondary | ICD-10-CM

## 2020-11-19 DIAGNOSIS — M25551 Pain in right hip: Secondary | ICD-10-CM | POA: Diagnosis not present

## 2020-11-19 MED ORDER — IOPAMIDOL (ISOVUE-M 200) INJECTION 41%: 1.0000 mL | Freq: Once | INTRAMUSCULAR | Status: DC

## 2020-11-19 MED ORDER — METHYLPREDNISOLONE ACETATE 40 MG/ML INJ SUSP (RADIOLOG
80.0000 mg | Freq: Once | INTRAMUSCULAR | Status: AC
  Administered 2020-11-19: 80 mg via INTRA_ARTICULAR

## 2021-01-08 ENCOUNTER — Other Ambulatory Visit: Payer: Self-pay

## 2021-01-08 ENCOUNTER — Emergency Department (HOSPITAL_BASED_OUTPATIENT_CLINIC_OR_DEPARTMENT_OTHER): Payer: Medicare PPO

## 2021-01-08 ENCOUNTER — Inpatient Hospital Stay (HOSPITAL_BASED_OUTPATIENT_CLINIC_OR_DEPARTMENT_OTHER)
Admission: EM | Admit: 2021-01-08 | Discharge: 2021-01-15 | DRG: 871 | Disposition: A | Discharge status: Skilled Nursing Facility | Payer: Medicare PPO | Attending: Internal Medicine | Admitting: Internal Medicine

## 2021-01-08 ENCOUNTER — Encounter (HOSPITAL_BASED_OUTPATIENT_CLINIC_OR_DEPARTMENT_OTHER): Payer: Self-pay

## 2021-01-08 DIAGNOSIS — M7989 Other specified soft tissue disorders: Secondary | ICD-10-CM | POA: Diagnosis not present

## 2021-01-08 DIAGNOSIS — G9341 Metabolic encephalopathy: Secondary | ICD-10-CM | POA: Diagnosis present

## 2021-01-08 DIAGNOSIS — I5032 Chronic diastolic (congestive) heart failure: Secondary | ICD-10-CM | POA: Diagnosis present

## 2021-01-08 DIAGNOSIS — L03115 Cellulitis of right lower limb: Secondary | ICD-10-CM | POA: Diagnosis present

## 2021-01-08 DIAGNOSIS — L539 Erythematous condition, unspecified: Secondary | ICD-10-CM | POA: Diagnosis not present

## 2021-01-08 DIAGNOSIS — L89312 Pressure ulcer of right buttock, stage 2: Secondary | ICD-10-CM | POA: Diagnosis present

## 2021-01-08 DIAGNOSIS — E7800 Pure hypercholesterolemia, unspecified: Secondary | ICD-10-CM | POA: Diagnosis present

## 2021-01-08 DIAGNOSIS — G4733 Obstructive sleep apnea (adult) (pediatric): Secondary | ICD-10-CM | POA: Diagnosis present

## 2021-01-08 DIAGNOSIS — Z9119 Patient's noncompliance with other medical treatment and regimen: Secondary | ICD-10-CM

## 2021-01-08 DIAGNOSIS — I671 Cerebral aneurysm, nonruptured: Secondary | ICD-10-CM | POA: Diagnosis present

## 2021-01-08 DIAGNOSIS — A419 Sepsis, unspecified organism: Principal | ICD-10-CM | POA: Diagnosis present

## 2021-01-08 DIAGNOSIS — R52 Pain, unspecified: Secondary | ICD-10-CM

## 2021-01-08 DIAGNOSIS — Z801 Family history of malignant neoplasm of trachea, bronchus and lung: Secondary | ICD-10-CM | POA: Diagnosis not present

## 2021-01-08 DIAGNOSIS — Z95820 Peripheral vascular angioplasty status with implants and grafts: Secondary | ICD-10-CM | POA: Diagnosis not present

## 2021-01-08 DIAGNOSIS — M2041 Other hammer toe(s) (acquired), right foot: Secondary | ICD-10-CM | POA: Diagnosis present

## 2021-01-08 DIAGNOSIS — G2581 Restless legs syndrome: Secondary | ICD-10-CM | POA: Diagnosis present

## 2021-01-08 DIAGNOSIS — Z7401 Bed confinement status: Secondary | ICD-10-CM | POA: Diagnosis not present

## 2021-01-08 DIAGNOSIS — Z91041 Radiographic dye allergy status: Secondary | ICD-10-CM

## 2021-01-08 DIAGNOSIS — G9340 Encephalopathy, unspecified: Secondary | ICD-10-CM | POA: Diagnosis present

## 2021-01-08 DIAGNOSIS — Z91048 Other nonmedicinal substance allergy status: Secondary | ICD-10-CM

## 2021-01-08 DIAGNOSIS — Z20822 Contact with and (suspected) exposure to covid-19: Secondary | ICD-10-CM | POA: Diagnosis present

## 2021-01-08 DIAGNOSIS — R6510 Systemic inflammatory response syndrome (SIRS) of non-infectious origin without acute organ dysfunction: Secondary | ICD-10-CM | POA: Diagnosis present

## 2021-01-08 DIAGNOSIS — J45909 Unspecified asthma, uncomplicated: Secondary | ICD-10-CM | POA: Diagnosis present

## 2021-01-08 DIAGNOSIS — G40909 Epilepsy, unspecified, not intractable, without status epilepticus: Secondary | ICD-10-CM | POA: Diagnosis present

## 2021-01-08 DIAGNOSIS — M1990 Unspecified osteoarthritis, unspecified site: Secondary | ICD-10-CM | POA: Diagnosis present

## 2021-01-08 DIAGNOSIS — I890 Lymphedema, not elsewhere classified: Secondary | ICD-10-CM | POA: Diagnosis present

## 2021-01-08 DIAGNOSIS — R519 Headache, unspecified: Secondary | ICD-10-CM | POA: Diagnosis not present

## 2021-01-08 DIAGNOSIS — G894 Chronic pain syndrome: Secondary | ICD-10-CM | POA: Diagnosis present

## 2021-01-08 DIAGNOSIS — Z87891 Personal history of nicotine dependence: Secondary | ICD-10-CM

## 2021-01-08 DIAGNOSIS — L719 Rosacea, unspecified: Secondary | ICD-10-CM | POA: Diagnosis present

## 2021-01-08 DIAGNOSIS — Z8673 Personal history of transient ischemic attack (TIA), and cerebral infarction without residual deficits: Secondary | ICD-10-CM

## 2021-01-08 DIAGNOSIS — Z6841 Body Mass Index (BMI) 40.0 and over, adult: Secondary | ICD-10-CM

## 2021-01-08 DIAGNOSIS — Z79899 Other long term (current) drug therapy: Secondary | ICD-10-CM

## 2021-01-08 DIAGNOSIS — Z888 Allergy status to other drugs, medicaments and biological substances status: Secondary | ICD-10-CM

## 2021-01-08 DIAGNOSIS — I110 Hypertensive heart disease with heart failure: Secondary | ICD-10-CM | POA: Diagnosis present

## 2021-01-08 DIAGNOSIS — Z7982 Long term (current) use of aspirin: Secondary | ICD-10-CM

## 2021-01-08 DIAGNOSIS — D649 Anemia, unspecified: Secondary | ICD-10-CM | POA: Diagnosis present

## 2021-01-08 DIAGNOSIS — Z885 Allergy status to narcotic agent status: Secondary | ICD-10-CM

## 2021-01-08 DIAGNOSIS — I10 Essential (primary) hypertension: Secondary | ICD-10-CM | POA: Diagnosis present

## 2021-01-08 DIAGNOSIS — R531 Weakness: Secondary | ICD-10-CM | POA: Diagnosis not present

## 2021-01-08 DIAGNOSIS — Z8049 Family history of malignant neoplasm of other genital organs: Secondary | ICD-10-CM | POA: Diagnosis not present

## 2021-01-08 DIAGNOSIS — L0390 Cellulitis, unspecified: Secondary | ICD-10-CM | POA: Diagnosis present

## 2021-01-08 DIAGNOSIS — K219 Gastro-esophageal reflux disease without esophagitis: Secondary | ICD-10-CM | POA: Diagnosis present

## 2021-01-08 DIAGNOSIS — R600 Localized edema: Secondary | ICD-10-CM | POA: Diagnosis not present

## 2021-01-08 DIAGNOSIS — R4182 Altered mental status, unspecified: Secondary | ICD-10-CM | POA: Diagnosis not present

## 2021-01-08 DIAGNOSIS — R4701 Aphasia: Secondary | ICD-10-CM | POA: Diagnosis present

## 2021-01-08 DIAGNOSIS — L03119 Cellulitis of unspecified part of limb: Secondary | ICD-10-CM

## 2021-01-08 DIAGNOSIS — Z981 Arthrodesis status: Secondary | ICD-10-CM

## 2021-01-08 LAB — CBC WITH DIFFERENTIAL/PLATELET
Abs Immature Granulocytes: 0.07 10*3/uL (ref 0.00–0.07)
Basophils Absolute: 0 10*3/uL (ref 0.0–0.1)
Basophils Relative: 0 %
Eosinophils Absolute: 0 10*3/uL (ref 0.0–0.5)
Eosinophils Relative: 0 %
HCT: 32.7 % — ABNORMAL LOW (ref 36.0–46.0)
Hemoglobin: 11.3 g/dL — ABNORMAL LOW (ref 12.0–15.0)
Immature Granulocytes: 1 %
Lymphocytes Relative: 14 %
Lymphs Abs: 1.5 10*3/uL (ref 0.7–4.0)
MCH: 32.4 pg (ref 26.0–34.0)
MCHC: 34.6 g/dL (ref 30.0–36.0)
MCV: 93.7 fL (ref 80.0–100.0)
Monocytes Absolute: 0.7 10*3/uL (ref 0.1–1.0)
Monocytes Relative: 7 %
Neutro Abs: 8.4 10*3/uL — ABNORMAL HIGH (ref 1.7–7.7)
Neutrophils Relative %: 78 %
Platelets: 197 10*3/uL (ref 150–400)
RBC: 3.49 MIL/uL — ABNORMAL LOW (ref 3.87–5.11)
RDW: 13.4 % (ref 11.5–15.5)
WBC: 10.7 10*3/uL — ABNORMAL HIGH (ref 4.0–10.5)
nRBC: 0 % (ref 0.0–0.2)

## 2021-01-08 LAB — COMPREHENSIVE METABOLIC PANEL
ALT: 16 U/L (ref 0–44)
AST: 24 U/L (ref 15–41)
Albumin: 3.5 g/dL (ref 3.5–5.0)
Alkaline Phosphatase: 83 U/L (ref 38–126)
Anion gap: 12 (ref 5–15)
BUN: 13 mg/dL (ref 8–23)
CO2: 24 mmol/L (ref 22–32)
Calcium: 9.6 mg/dL (ref 8.9–10.3)
Chloride: 99 mmol/L (ref 98–111)
Creatinine, Ser: 0.93 mg/dL (ref 0.44–1.00)
GFR, Estimated: >60 mL/min (ref >60)
Glucose, Bld: 99 mg/dL (ref 70–99)
Potassium: 3.4 mmol/L — ABNORMAL LOW (ref 3.5–5.1)
Sodium: 135 mmol/L (ref 135–145)
Total Bilirubin: 1.4 mg/dL — ABNORMAL HIGH (ref 0.3–1.2)
Total Protein: 7.5 g/dL (ref 6.5–8.1)

## 2021-01-08 LAB — PROTIME-INR
INR: 1.2 (ref 0.8–1.2)
Prothrombin Time: 14.8 seconds (ref 11.4–15.2)

## 2021-01-08 LAB — CBG MONITORING, ED: Glucose-Capillary: 90 mg/dL (ref 70–99)

## 2021-01-08 LAB — LIPASE, BLOOD: Lipase: <10 U/L — ABNORMAL LOW (ref 11–51)

## 2021-01-08 LAB — LACTIC ACID, PLASMA: Lactic Acid, Venous: 1.2 mmol/L (ref 0.5–1.9)

## 2021-01-08 MED ORDER — DIPHENHYDRAMINE HCL 25 MG PO CAPS
50.0000 mg | ORAL_CAPSULE | Freq: Once | ORAL | Status: AC
  Administered 2021-01-09: 50 mg via ORAL
  Filled 2021-01-08: qty 2

## 2021-01-08 MED ORDER — DIPHENHYDRAMINE HCL 50 MG/ML IJ SOLN
50.0000 mg | Freq: Once | INTRAMUSCULAR | Status: AC
  Filled 2021-01-08: qty 1

## 2021-01-08 MED ORDER — HYDROCORTISONE NA SUCCINATE PF 100 MG IJ SOLR
200.0000 mg | Freq: Once | INTRAMUSCULAR | Status: AC
  Administered 2021-01-08: 200 mg via INTRAVENOUS
  Filled 2021-01-08: qty 4

## 2021-01-08 NOTE — ED Notes (Signed)
RT placed PIV without difficulty. PIV secured, flushed, and saline locked.
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
## 2021-01-08 NOTE — ED Triage Notes (Signed)
Patient here POV from Home with Leg Swelling.  Patient states she was outside for several hours approximately 4 days PTA and the day afterwards she has not been acting "herself" and noticed increased swelling and Pain to the Right Leg.   Patient states she has Lymphedema chronically in the Right Leg but the Swelling has been worse recently.  No Fevers. A&Ox4. GCS 15. No Pain otherwise. No SOB. Patient BIB Wheelchair.

## 2021-01-09 ENCOUNTER — Inpatient Hospital Stay (HOSPITAL_COMMUNITY): Payer: Medicare PPO

## 2021-01-09 ENCOUNTER — Encounter (HOSPITAL_COMMUNITY): Payer: Self-pay | Admitting: Internal Medicine

## 2021-01-09 ENCOUNTER — Other Ambulatory Visit: Payer: Self-pay

## 2021-01-09 DIAGNOSIS — Z801 Family history of malignant neoplasm of trachea, bronchus and lung: Secondary | ICD-10-CM | POA: Diagnosis not present

## 2021-01-09 DIAGNOSIS — G2581 Restless legs syndrome: Secondary | ICD-10-CM | POA: Diagnosis present

## 2021-01-09 DIAGNOSIS — A419 Sepsis, unspecified organism: Secondary | ICD-10-CM | POA: Diagnosis present

## 2021-01-09 DIAGNOSIS — I110 Hypertensive heart disease with heart failure: Secondary | ICD-10-CM | POA: Diagnosis present

## 2021-01-09 DIAGNOSIS — Z20822 Contact with and (suspected) exposure to covid-19: Secondary | ICD-10-CM | POA: Diagnosis present

## 2021-01-09 DIAGNOSIS — G9340 Encephalopathy, unspecified: Secondary | ICD-10-CM | POA: Diagnosis not present

## 2021-01-09 DIAGNOSIS — Z6841 Body Mass Index (BMI) 40.0 and over, adult: Secondary | ICD-10-CM | POA: Diagnosis not present

## 2021-01-09 DIAGNOSIS — L03119 Cellulitis of unspecified part of limb: Secondary | ICD-10-CM | POA: Diagnosis not present

## 2021-01-09 DIAGNOSIS — G4733 Obstructive sleep apnea (adult) (pediatric): Secondary | ICD-10-CM | POA: Diagnosis present

## 2021-01-09 DIAGNOSIS — G40909 Epilepsy, unspecified, not intractable, without status epilepticus: Secondary | ICD-10-CM

## 2021-01-09 DIAGNOSIS — Z8049 Family history of malignant neoplasm of other genital organs: Secondary | ICD-10-CM | POA: Diagnosis not present

## 2021-01-09 DIAGNOSIS — Z95820 Peripheral vascular angioplasty status with implants and grafts: Secondary | ICD-10-CM | POA: Diagnosis not present

## 2021-01-09 DIAGNOSIS — D649 Anemia, unspecified: Secondary | ICD-10-CM | POA: Diagnosis present

## 2021-01-09 DIAGNOSIS — I10 Essential (primary) hypertension: Secondary | ICD-10-CM

## 2021-01-09 DIAGNOSIS — L03115 Cellulitis of right lower limb: Secondary | ICD-10-CM | POA: Diagnosis present

## 2021-01-09 DIAGNOSIS — M2041 Other hammer toe(s) (acquired), right foot: Secondary | ICD-10-CM | POA: Diagnosis present

## 2021-01-09 DIAGNOSIS — J45909 Unspecified asthma, uncomplicated: Secondary | ICD-10-CM | POA: Diagnosis present

## 2021-01-09 DIAGNOSIS — E7800 Pure hypercholesterolemia, unspecified: Secondary | ICD-10-CM | POA: Diagnosis present

## 2021-01-09 DIAGNOSIS — R6510 Systemic inflammatory response syndrome (SIRS) of non-infectious origin without acute organ dysfunction: Secondary | ICD-10-CM

## 2021-01-09 DIAGNOSIS — I671 Cerebral aneurysm, nonruptured: Secondary | ICD-10-CM | POA: Diagnosis present

## 2021-01-09 DIAGNOSIS — R4701 Aphasia: Secondary | ICD-10-CM | POA: Diagnosis present

## 2021-01-09 DIAGNOSIS — I5032 Chronic diastolic (congestive) heart failure: Secondary | ICD-10-CM | POA: Diagnosis present

## 2021-01-09 DIAGNOSIS — I890 Lymphedema, not elsewhere classified: Secondary | ICD-10-CM | POA: Diagnosis not present

## 2021-01-09 DIAGNOSIS — G894 Chronic pain syndrome: Secondary | ICD-10-CM | POA: Diagnosis present

## 2021-01-09 DIAGNOSIS — M1990 Unspecified osteoarthritis, unspecified site: Secondary | ICD-10-CM | POA: Diagnosis present

## 2021-01-09 DIAGNOSIS — G9341 Metabolic encephalopathy: Secondary | ICD-10-CM | POA: Diagnosis present

## 2021-01-09 DIAGNOSIS — L89312 Pressure ulcer of right buttock, stage 2: Secondary | ICD-10-CM | POA: Diagnosis present

## 2021-01-09 LAB — URINALYSIS, ROUTINE W REFLEX MICROSCOPIC
Bilirubin Urine: NEGATIVE
Glucose, UA: NEGATIVE mg/dL
Ketones, ur: 20 mg/dL — AB
Leukocytes,Ua: NEGATIVE
Nitrite: NEGATIVE
Protein, ur: >=300 mg/dL — AB
Specific Gravity, Urine: 1.016 (ref 1.005–1.030)
pH: 6 (ref 5.0–8.0)

## 2021-01-09 LAB — CBC
HCT: 29.1 % — ABNORMAL LOW (ref 36.0–46.0)
Hemoglobin: 9.7 g/dL — ABNORMAL LOW (ref 12.0–15.0)
MCH: 31.9 pg (ref 26.0–34.0)
MCHC: 33.3 g/dL (ref 30.0–36.0)
MCV: 95.7 fL (ref 80.0–100.0)
Platelets: 168 K/uL (ref 150–400)
RBC: 3.04 MIL/uL — ABNORMAL LOW (ref 3.87–5.11)
RDW: 13.2 % (ref 11.5–15.5)
WBC: 9.7 K/uL (ref 4.0–10.5)
nRBC: 0 % (ref 0.0–0.2)

## 2021-01-09 LAB — RESP PANEL BY RT-PCR (FLU A&B, COVID) ARPGX2
Influenza A by PCR: NEGATIVE
Influenza B by PCR: NEGATIVE
SARS Coronavirus 2 by RT PCR: NEGATIVE

## 2021-01-09 LAB — MRSA NEXT GEN BY PCR, NASAL: MRSA by PCR Next Gen: NOT DETECTED

## 2021-01-09 LAB — BASIC METABOLIC PANEL
Anion gap: 10 (ref 5–15)
BUN: 13 mg/dL (ref 8–23)
CO2: 20 mmol/L — ABNORMAL LOW (ref 22–32)
Calcium: 8.6 mg/dL — ABNORMAL LOW (ref 8.9–10.3)
Chloride: 103 mmol/L (ref 98–111)
Creatinine, Ser: 0.98 mg/dL (ref 0.44–1.00)
GFR, Estimated: >60 mL/min (ref >60)
Glucose, Bld: 135 mg/dL — ABNORMAL HIGH (ref 70–99)
Potassium: 3.4 mmol/L — ABNORMAL LOW (ref 3.5–5.1)
Sodium: 133 mmol/L — ABNORMAL LOW (ref 135–145)

## 2021-01-09 LAB — HIV ANTIBODY (ROUTINE TESTING W REFLEX): HIV Screen 4th Generation wRfx: NONREACTIVE

## 2021-01-09 LAB — TSH: TSH: 1.641 u[IU]/mL (ref 0.350–4.500)

## 2021-01-09 MED ORDER — DOXYCYCLINE 40 MG PO CPDR: 40.0000 mg | DELAYED_RELEASE_CAPSULE | Freq: Every morning | ORAL | Status: DC

## 2021-01-09 MED ORDER — PHENOBARBITAL 64.8 MG PO TABS
129.6000 mg | ORAL_TABLET | ORAL | Status: AC
  Administered 2021-01-09: 129.6 mg via ORAL
  Filled 2021-01-09: qty 2

## 2021-01-09 MED ORDER — VANCOMYCIN HCL IN DEXTROSE 1-5 GM/200ML-% IV SOLN: 1000.0000 mg | Freq: Once | INTRAVENOUS | Status: DC

## 2021-01-09 MED ORDER — METOLAZONE 5 MG PO TABS
5.0000 mg | ORAL_TABLET | Freq: Every day | ORAL | Status: DC
  Administered 2021-01-09 – 2021-01-13 (×5): 5 mg via ORAL
  Filled 2021-01-09 (×6): qty 1

## 2021-01-09 MED ORDER — DOXYCYCLINE HYCLATE 50 MG PO CAPS
50.0000 mg | ORAL_CAPSULE | Freq: Every day | ORAL | Status: DC
  Filled 2021-01-09: qty 1

## 2021-01-09 MED ORDER — DOXYCYCLINE HYCLATE 50 MG PO CAPS
50.0000 mg | ORAL_CAPSULE | ORAL | Status: DC
  Administered 2021-01-09 – 2021-01-12 (×4): 50 mg via ORAL
  Filled 2021-01-09 (×4): qty 1

## 2021-01-09 MED ORDER — HYDROXYZINE HCL 25 MG PO TABS
25.0000 mg | ORAL_TABLET | Freq: Every day | ORAL | Status: DC
  Administered 2021-01-09 – 2021-01-15 (×7): 25 mg via ORAL
  Filled 2021-01-09 (×7): qty 1

## 2021-01-09 MED ORDER — DIPHENHYDRAMINE HCL 50 MG/ML IJ SOLN
50.0000 mg | Freq: Once | INTRAMUSCULAR | Status: AC
  Administered 2021-01-09: 50 mg via INTRAVENOUS

## 2021-01-09 MED ORDER — ACETAMINOPHEN 650 MG RE SUPP
650.0000 mg | Freq: Once | RECTAL | Status: AC
  Administered 2021-01-09: 650 mg via RECTAL
  Filled 2021-01-09: qty 1

## 2021-01-09 MED ORDER — MONTELUKAST SODIUM 10 MG PO TABS
10.0000 mg | ORAL_TABLET | Freq: Every day | ORAL | Status: DC
  Administered 2021-01-09 – 2021-01-14 (×6): 10 mg via ORAL
  Filled 2021-01-09 (×6): qty 1

## 2021-01-09 MED ORDER — GABAPENTIN 100 MG PO CAPS
200.0000 mg | ORAL_CAPSULE | Freq: Three times a day (TID) | ORAL | Status: DC
  Administered 2021-01-09 – 2021-01-15 (×19): 200 mg via ORAL
  Filled 2021-01-09 (×19): qty 2

## 2021-01-09 MED ORDER — ROSUVASTATIN CALCIUM 20 MG PO TABS
20.0000 mg | ORAL_TABLET | Freq: Every day | ORAL | Status: DC
  Administered 2021-01-09 – 2021-01-15 (×7): 20 mg via ORAL
  Filled 2021-01-09 (×7): qty 1

## 2021-01-09 MED ORDER — TORSEMIDE 20 MG PO TABS
20.0000 mg | ORAL_TABLET | Freq: Every day | ORAL | Status: DC
  Administered 2021-01-09 – 2021-01-15 (×7): 20 mg via ORAL
  Filled 2021-01-09 (×7): qty 1

## 2021-01-09 MED ORDER — IOHEXOL 350 MG/ML SOLN
75.0000 mL | Freq: Once | INTRAVENOUS | Status: AC | PRN
  Administered 2021-01-09: 75 mL via INTRAVENOUS

## 2021-01-09 MED ORDER — FERROUS SULFATE 325 (65 FE) MG PO TABS
650.0000 mg | ORAL_TABLET | Freq: Every day | ORAL | Status: DC
  Administered 2021-01-09 – 2021-01-15 (×7): 650 mg via ORAL
  Filled 2021-01-09 (×7): qty 2

## 2021-01-09 MED ORDER — SPIRONOLACTONE 25 MG PO TABS
50.0000 mg | ORAL_TABLET | Freq: Every day | ORAL | Status: DC
  Administered 2021-01-09 – 2021-01-15 (×7): 50 mg via ORAL
  Filled 2021-01-09 (×7): qty 2

## 2021-01-09 MED ORDER — ASPIRIN EC 81 MG PO TBEC
81.0000 mg | DELAYED_RELEASE_TABLET | Freq: Two times a day (BID) | ORAL | Status: DC
  Administered 2021-01-09 – 2021-01-15 (×13): 81 mg via ORAL
  Filled 2021-01-09 (×13): qty 1

## 2021-01-09 MED ORDER — AMLODIPINE BESYLATE 5 MG PO TABS
5.0000 mg | ORAL_TABLET | Freq: Every day | ORAL | Status: DC
  Administered 2021-01-09 – 2021-01-15 (×7): 5 mg via ORAL
  Filled 2021-01-09 (×7): qty 1

## 2021-01-09 MED ORDER — POTASSIUM CHLORIDE CRYS ER 20 MEQ PO TBCR
40.0000 meq | EXTENDED_RELEASE_TABLET | Freq: Once | ORAL | Status: AC
  Administered 2021-01-09: 40 meq via ORAL
  Filled 2021-01-09: qty 2

## 2021-01-09 MED ORDER — SODIUM CHLORIDE 0.9 % IV SOLN
2.0000 g | Freq: Three times a day (TID) | INTRAVENOUS | Status: DC
  Administered 2021-01-09 – 2021-01-10 (×4): 2 g via INTRAVENOUS
  Filled 2021-01-09 (×4): qty 2

## 2021-01-09 MED ORDER — GABAPENTIN ENACARBIL ER 600 MG PO TBCR: 600.0000 mg | EXTENDED_RELEASE_TABLET | Freq: Every day | ORAL | Status: DC

## 2021-01-09 MED ORDER — PANTOPRAZOLE SODIUM 40 MG PO TBEC
40.0000 mg | DELAYED_RELEASE_TABLET | Freq: Every day | ORAL | Status: DC
  Administered 2021-01-09 – 2021-01-14 (×6): 40 mg via ORAL
  Filled 2021-01-09 (×6): qty 1

## 2021-01-09 MED ORDER — ZINC GLUCONATE 50 MG PO TABS: 50.0000 mg | ORAL_TABLET | Freq: Every day | ORAL | Status: DC

## 2021-01-09 MED ORDER — MAGNESIUM OXIDE -MG SUPPLEMENT 400 (240 MG) MG PO TABS
1200.0000 mg | ORAL_TABLET | Freq: Every day | ORAL | Status: DC
  Administered 2021-01-09 – 2021-01-14 (×6): 1200 mg via ORAL
  Filled 2021-01-09 (×6): qty 3

## 2021-01-09 MED ORDER — PRAMIPEXOLE DIHYDROCHLORIDE 0.25 MG PO TABS: 0.2500 mg | ORAL_TABLET | Freq: Every evening | ORAL | Status: DC

## 2021-01-09 MED ORDER — PIPERACILLIN-TAZOBACTAM 3.375 G IVPB 30 MIN
3.3750 g | Freq: Once | INTRAVENOUS | Status: AC
  Administered 2021-01-09: 3.375 g via INTRAVENOUS
  Filled 2021-01-09: qty 50

## 2021-01-09 MED ORDER — PHENOBARBITAL 32.4 MG PO TABS
129.6000 mg | ORAL_TABLET | Freq: Every day | ORAL | Status: DC
  Administered 2021-01-09 – 2021-01-14 (×6): 129.6 mg via ORAL
  Filled 2021-01-09 (×6): qty 4

## 2021-01-09 MED ORDER — ENOXAPARIN SODIUM 60 MG/0.6ML IJ SOSY
60.0000 mg | PREFILLED_SYRINGE | INTRAMUSCULAR | Status: DC
  Administered 2021-01-09 – 2021-01-14 (×6): 60 mg via SUBCUTANEOUS
  Filled 2021-01-09 (×7): qty 0.6

## 2021-01-09 MED ORDER — POTASSIUM CHLORIDE CRYS ER 20 MEQ PO TBCR
80.0000 meq | EXTENDED_RELEASE_TABLET | Freq: Every day | ORAL | Status: DC
  Administered 2021-01-09 – 2021-01-15 (×7): 80 meq via ORAL
  Filled 2021-01-09 (×7): qty 4

## 2021-01-09 MED ORDER — PRAMIPEXOLE DIHYDROCHLORIDE 0.25 MG PO TABS
0.2500 mg | ORAL_TABLET | Freq: Four times a day (QID) | ORAL | Status: DC
  Administered 2021-01-09 – 2021-01-15 (×25): 0.25 mg via ORAL
  Filled 2021-01-09 (×31): qty 1

## 2021-01-09 MED ORDER — IRBESARTAN 75 MG PO TABS
75.0000 mg | ORAL_TABLET | Freq: Two times a day (BID) | ORAL | Status: DC
  Administered 2021-01-09 – 2021-01-15 (×13): 75 mg via ORAL
  Filled 2021-01-09 (×14): qty 1

## 2021-01-09 MED ORDER — VANCOMYCIN HCL 2000 MG/400ML IV SOLN
2000.0000 mg | Freq: Once | INTRAVENOUS | Status: AC
  Administered 2021-01-09: 2000 mg via INTRAVENOUS
  Filled 2021-01-09: qty 400

## 2021-01-09 MED ORDER — VANCOMYCIN HCL 1500 MG/300ML IV SOLN
1500.0000 mg | INTRAVENOUS | Status: DC
  Administered 2021-01-10 – 2021-01-12 (×3): 1500 mg via INTRAVENOUS
  Filled 2021-01-09 (×3): qty 300

## 2021-01-09 MED ORDER — ZINC SULFATE 220 (50 ZN) MG PO CAPS
220.0000 mg | ORAL_CAPSULE | Freq: Every day | ORAL | Status: DC
  Administered 2021-01-10 – 2021-01-15 (×6): 220 mg via ORAL
  Filled 2021-01-09 (×6): qty 1

## 2021-01-09 MED ORDER — SODIUM CHLORIDE 0.9 % IV SOLN: 2.0000 g | Freq: Once | INTRAVENOUS | Status: DC

## 2021-01-09 NOTE — ED Notes (Signed)
Report received from CCT.

## 2021-01-09 NOTE — Progress Notes (Signed)
PROGRESS NOTE        PATIENT DETAILS Name: [name] Age: 70 y.o. Sex: female Date of Birth: 09-14-1950 Admit Date: 01/08/2021 Admitting Physician Eduard Clos, MD ZOX:[id number], Jasmine December, MD  Brief Narrative: Patient is a 70 y.o. female with history of chronic bilateral lower extremity lymphedema, seizures, OSA (noncompliant to CPAP) HLD, SAH-s/p craniotomy and aneurysm clipping-presented with confusion-and sepsis physiology secondary to RLE cellulitis.  Significant events: 8/31>> fever-confusion-RLE cellulitis-admit to [facility].  Significant studies: 8/31>> CXR: No pneumonia. 8/31>> Doppler bilateral lower extremity: No DVT 8/31>> CT head: No acute intracranial abnormality. 9/01>> CT angio head/neck: No acute abnormality-no change from CTA done in 2014 (fusiform aneurysm of the supraclinoid ICA on right-s/p stenting, aneurysmal disease dilatation distal to the stent, aneurysmal clip at the right MCA bifurcation, right PCA origin). 9/01>> x-ray right tibia/fibula: No acute finding. 9/01>> x-ray right foot: No acute finding. 9/01>> EEG: No seizures  Antimicrobial therapy: Vancomycin: 8/31>> Cefepime: 8/31>>  Microbiology data: 8/31>> influenza/COVID PCR: 8/31>> blood culture: Pending 9/01>> urine culture: Pending  Procedures : None  Consults: Neurology  DVT Prophylaxis : Prophylactic Lovenox  Subjective: Confusion has improved-although she is still somewhat not able to answer some questions appropriately.  Assessment/Plan: Sepsis due to RLE cellulitis: RLE remains erythematous-more swollen than baseline.  Has lymphedema at baseline.  Continue empiric antibiotics-have encouraged patient to elevate extremity is much as possible.  Acute metabolic encephalopathy: Likely due to sepsis physiology/RLE cellulitis-neuro imaging without any acute findings-EEG without seizures.  Mentation seems to have improved compared to yesterday-although still  not at baseline.  Continue supportive care-neurology following-we will await further recommendations.    Seizure disorder: EEG negative-continue phenobarbital.  HTN: BP stable-continue ARB/amlodipine  HLD: Continue statin  HFpEF/bilateral lower extremity lymphedema: Volume status stable-unchanged-continue Aldactone/metolazone  History of rosacea: On doxycycline  OSA: CPAP at night-but has been noncompliant lately  Morbid Obesity: Estimated body mass index is 54.87 kg/m as calculated from the following:   Height as of this encounter:  (1.575 m).   Weight as of this encounter: 136.1 kg.     Diet: Diet Order             Diet Heart Room service appropriate? Yes; Fluid consistency: Thin; Fluid restriction: 1200 mL Fluid  Diet effective now                    Code Status: Full code   Family Communication: Spouse at bedside  Disposition Plan: Status is: Inpatient  Remains inpatient appropriate because:Inpatient level of care appropriate due to severity of illness  Dispo: The patient is from: Home              Anticipated d/c is to: Home              Patient currently is not medically stable to d/c.   Difficult to place patient No    Barriers to Discharge: Sepsis physiology-improving encephalopathy (not at baseline) on empiric IV antibiotics-culture data pending-not stable for discharge.  Antimicrobial agents: Anti-infectives (From admission, onward)    Start     Dose/Rate Route Frequency Ordered Stop   01/10/21 0000  vancomycin (VANCOREADY) IVPB 1500 mg/300 mL        1,500 mg 150 mL/hr over 120 Minutes Intravenous Every 24 hours 01/09/21 0514     01/09/21 1200  doxycycline (VIBRAMYCIN) 50  MG capsule 50 mg        50 mg Oral Every 24 hours 01/09/21 0905     01/09/21 1000  doxycycline (VIBRAMYCIN) 50 MG capsule 50 mg  Status:  Discontinued        50 mg Oral Daily 01/09/21 0904 01/09/21 0905   01/09/21 0600  ceFEPIme (MAXIPIME) 2 g in sodium chloride 0.9 %  100 mL IVPB        2 g 200 mL/hr over 30 Minutes Intravenous Every 8 hours 01/09/21 0514     01/09/21 0445  vancomycin (VANCOCIN) IVPB 1000 mg/200 mL premix  Status:  Discontinued        1,000 mg 200 mL/hr over 60 Minutes Intravenous  Once 01/09/21 0444 01/09/21 0448   01/09/21 0445  ceFEPIme (MAXIPIME) 2 g in sodium chloride 0.9 % 100 mL IVPB  Status:  Discontinued        2 g 200 mL/hr over 30 Minutes Intravenous  Once 01/09/21 0444 01/09/21 0514   01/09/21 0115  vancomycin (VANCOREADY) IVPB 2000 mg/400 mL        2,000 mg 200 mL/hr over 120 Minutes Intravenous  Once 01/09/21 0059 01/09/21 0423   01/09/21 0100  piperacillin-tazobactam (ZOSYN) IVPB 3.375 g        3.375 g 100 mL/hr over 30 Minutes Intravenous  Once 01/09/21 0059 01/09/21 0211        Time spent: 35 minutes-Greater than 50% of this time was spent in counseling, explanation of diagnosis, planning of further management, and coordination of care.  MEDICATIONS: Scheduled Meds:  amLODipine  5 mg Oral Daily   aspirin EC  81 mg Oral BID   doxycycline  50 mg Oral Q24H   enoxaparin (LOVENOX) injection  60 mg Subcutaneous Q24H   ferrous sulfate  650 mg Oral Q breakfast   gabapentin  200 mg Oral TID   hydrOXYzine  25 mg Oral Daily   irbesartan  75 mg Oral BID   magnesium oxide  1,200 mg Oral QHS   metolazone  5 mg Oral Daily   montelukast  10 mg Oral QHS   pantoprazole  40 mg Oral QHS   PHENobarbital  129.6 mg Oral QHS   potassium chloride SA  80 mEq Oral Daily   pramipexole  0.25 mg Oral Q6H   rosuvastatin  20 mg Oral Daily   spironolactone  50 mg Oral Daily   torsemide  20 mg Oral Daily   [START ON 01/10/2021] zinc sulfate  220 mg Oral Daily   Continuous Infusions:  ceFEPime (MAXIPIME) IV Stopped (01/09/21 6967)   [START ON 01/10/2021] vancomycin     PRN Meds:.   PHYSICAL EXAM: Vital signs: Vitals:   01/09/21 0630 01/09/21 0817 01/09/21 0930 01/09/21 1000  BP: (!) 142/76 133/61 (!) 149/66 (!) 140/59  Pulse: 77  74 79 78  Resp: (!) 21 18 (!) 24 17  Temp:  98.7 F (37.1 C)    TempSrc:  Oral    SpO2: 96% 95% (!) 86% 93%  Weight:      Height:       Filed Weights   01/08/21 1838  Weight: 136.1 kg   Body mass index is 54.87 kg/m.   Gen Exam:Alert awake-not in any distress HEENT:atraumatic, normocephalic Chest: B/L clear to auscultation anteriorly CVS:S1S2 regular Abdomen:soft non tender, non distended Extremities: Right leg-swollen/erythematous-no crepitus-has chronic skin thickening-and changes consistent with lymphedema. Neurology: Non focal Skin: no rash  I have personally reviewed following labs and imaging studies  LABORATORY DATA: CBC: Recent Labs  Lab 01/08/21 2148 01/09/21 0747  WBC 10.7* 9.7  NEUTROABS 8.4*  --   HGB 11.3* 9.7*  HCT 32.7* 29.1*  MCV 93.7 95.7  PLT 197 168    Basic Metabolic Panel: Recent Labs  Lab 01/08/21 2148 01/09/21 0747  NA 135 133*  K 3.4* 3.4*  CL 99 103  CO2 24 20*  GLUCOSE 99 135*  BUN 13 13  CREATININE 0.93 0.98  CALCIUM 9.6 8.6*    GFR: Estimated Creatinine Clearance: 72.3 mL/min (by C-G formula based on SCr of 0.98 mg/dL).  Liver Function Tests: Recent Labs  Lab 01/08/21 2148  AST 24  ALT 16  ALKPHOS 83  BILITOT 1.4*  PROT 7.5  ALBUMIN 3.5   Recent Labs  Lab 01/08/21 2148  LIPASE <10*   No results for input(s): AMMONIA in the last 168 hours.  Coagulation Profile: Recent Labs  Lab 01/08/21 2253  INR 1.2    Cardiac Enzymes: No results for input(s): CKTOTAL, CKMB, CKMBINDEX, TROPONINI in the last 168 hours.  BNP (last 3 results) No results for input(s): PROBNP in the last 8760 hours.  Lipid Profile: No results for input(s): CHOL, HDL, LDLCALC, TRIG, CHOLHDL, LDLDIRECT in the last 72 hours.  Thyroid Function Tests: No results for input(s): TSH, T4TOTAL, FREET4, T3FREE, THYROIDAB in the last 72 hours.  Anemia Panel: No results for input(s): VITAMINB12, FOLATE, FERRITIN, TIBC, IRON, RETICCTPCT in the  last 72 hours.  Urine analysis:    Component Value Date/Time   COLORURINE AMBER (A) 01/09/2021 0048   APPEARANCEUR CLEAR 01/09/2021 0048   LABSPEC 1.016 01/09/2021 0048   PHURINE 6.0 01/09/2021 0048   GLUCOSEU NEGATIVE 01/09/2021 0048   HGBUR MODERATE (A) 01/09/2021 0048   BILIRUBINUR NEGATIVE 01/09/2021 0048   KETONESUR 20 (A) 01/09/2021 0048   PROTEINUR >=300 (A) 01/09/2021 0048   NITRITE NEGATIVE 01/09/2021 0048   LEUKOCYTESUR NEGATIVE 01/09/2021 0048    Sepsis Labs: Lactic Acid, Venous    Component Value Date/Time   LATICACIDVEN 1.2 01/08/2021 2253    MICROBIOLOGY: Recent Results (from the past 240 hour(s))  Resp Panel by RT-PCR (Flu A&B, Covid) Peripheral     Status: None   Collection Time: 01/08/21 11:43 PM   Specimen: Peripheral; Nasopharyngeal(NP) swabs in vial transport medium  Result Value Ref Range Status   SARS Coronavirus 2 by RT PCR NEGATIVE NEGATIVE Final    Comment: (NOTE) SARS-CoV-2 target nucleic acids are NOT DETECTED.  The SARS-CoV-2 RNA is generally detectable in upper respiratory specimens during the acute phase of infection. The lowest concentration of SARS-CoV-2 viral copies this assay can detect is 138 copies/mL. A negative result does not preclude SARS-Cov-2 infection and should not be used as the sole basis for treatment or other patient management decisions. A negative result may occur with  improper specimen collection/handling, submission of specimen other than nasopharyngeal swab, presence of viral mutation(s) within the areas targeted by this assay, and inadequate number of viral copies(<138 copies/mL). A negative result must be combined with clinical observations, patient history, and epidemiological information. The expected result is Negative.  Fact Sheet for Patients:  BloggerCourse.com  Fact Sheet for Healthcare Providers:  SeriousBroker.it  This test is no t yet approved or  cleared by the [place] FDA and  has been authorized for detection and/or diagnosis of SARS-CoV-2 by FDA under an Emergency Use Authorization (EUA). This EUA will remain  in effect (meaning this test can be used) for the duration of the  COVID-19 declaration under Section 564(b)(1) of the Act, 21 U.S.C.section 360bbb-3(b)(1), unless the authorization is terminated  or revoked sooner.       Influenza A by PCR NEGATIVE NEGATIVE Final   Influenza B by PCR NEGATIVE NEGATIVE Final    Comment: (NOTE) The Xpert Xpress SARS-CoV-2/FLU/RSV plus assay is intended as an aid in the diagnosis of influenza from Nasopharyngeal swab specimens and should not be used as a sole basis for treatment. Nasal washings and aspirates are unacceptable for Xpert Xpress SARS-CoV-2/FLU/RSV testing.  Fact Sheet for Patients: BloggerCourse.comhttps://www.fda.gov/media/152166/download  Fact Sheet for Healthcare Providers: SeriousBroker.ithttps://www.fda.gov/media/152162/download  This test is not yet approved or cleared by the [place] FDA and has been authorized for detection and/or diagnosis of SARS-CoV-2 by FDA under an Emergency Use Authorization (EUA). This EUA will remain in effect (meaning this test can be used) for the duration of the COVID-19 declaration under Section 564(b)(1) of the Act, 21 U.S.C. section 360bbb-3(b)(1), unless the authorization is terminated or revoked.  Performed at [facility], [address], [place], [place] [address]     RADIOLOGY STUDIES/RESULTS: CT Angio Head W or Wo Contrast  Result Date: 01/09/2021 CLINICAL DATA:  Acute neuro deficit. Headache and aphasia 5 days. History of cerebral aneurysm clipping EXAM: CT ANGIOGRAPHY HEAD AND NECK TECHNIQUE: Multidetector CT imaging of the head and neck was performed using the standard protocol during bolus administration of intravenous contrast. Multiplanar CT image reconstructions and MIPs were obtained to evaluate the vascular anatomy.  Carotid stenosis measurements (when applicable) are obtained utilizing NASCET criteria, using the distal internal carotid diameter as the denominator. CONTRAST:  75mL OMNIPAQUE IOHEXOL 350 MG/ML SOLN COMPARISON:  CT angio head 01/27/2013.  CT head 01/08/2021 FINDINGS: CTA NECK FINDINGS Aortic arch: Mild atherosclerotic disease aortic arch and proximal great vessels. Proximal great vessels widely patent. Right carotid system: Mild atherosclerotic disease right carotid bifurcation. No significant carotid stenosis on the right. Left carotid system: Mild atherosclerotic calcification left common carotid artery and left carotid bifurcation without significant stenosis. Vertebral arteries: Both vertebral arteries patent to the skull base without stenosis. Skeleton: Cervical spondylosis, mild-to-moderate. No acute skeletal abnormality. Other neck: Negative for mass or adenopathy in the neck. Upper chest: Lung apices clear bilaterally. Review of the MIP images confirms the above findings CTA HEAD FINDINGS Anterior circulation: Bilateral pterional craniotomy for aneurysm clipping bilaterally. In addition, there is a stent in the right cavernous carotid extending into the right M1 segment. Stent and clips unchanged from the prior CTA 2014. Right carotid artery stent is patent. There is atherosclerotic calcification in the wall of the supraclinoid internal carotid artery with fusiform aneurysmal dilatation near the stent. This measures up to 8.8 mm in diameter, unchanged from the prior study. There is contrast external to the stent in the aneurysm sac, unchanged from the prior CTA. There is fusiform aneurysmal dilatation of the distal right M1 segment at the bifurcation, unchanged from the prior CTA 2014. There is adjacent aneurysm clip. Aneurysm clip also present in the region of the right posterior communicating artery origin without residual aneurysm. Right anterior cerebral artery widely patent. Left anterior cerebral  artery widely patent. Aneurysm clip in the region of the terminal left internal carotid artery without residual aneurysm. No change from prior study. Left middle cerebral artery patent without aneurysm or stenosis. Posterior circulation: Widely patent posterior circulation. No aneurysm. Venous sinuses: Normal venous enhancement. Anatomic variants: None Review of the MIP images confirms the above findings IMPRESSION: 1. No acute abnormality and  no change from CTA 2014. 2. Fusiform aneurysm of the supraclinoid internal carotid artery on the right, unchanged from the prior study. There has been stenting of this aneurysm. There remains contrast outside of the stent unchanged from 2014. Aneurysmal dilatation distal to the stent also unchanged from the prior study. Aneurysm clip right MCA bifurcation and right PCA origin without change from the prior study. 3. Aneurysm clip left carotid terminus without recurrent aneurysm, unchanged from the prior study. Electronically Signed   By: Marlan Palau M.D.   On: 01/09/2021 08:17   DG Tibia/Fibula Right  Result Date: 01/09/2021 CLINICAL DATA:  Leg swelling EXAM: RIGHT TIBIA AND FIBULA - 2 VIEW COMPARISON:  None. FINDINGS: Extensive subcutaneous reticulation and lobulated skin thickening. No evidence of fracture or subluxation. Possible tibiotalar arthrodesis. Knee osteoarthritis with joint space narrowing and spurring. IMPRESSION: No acute finding. Electronically Signed   By: Marnee Spring M.D.   On: 01/09/2021 07:03   CT HEAD WO CONTRAST ( )  Result Date: 01/08/2021 CLINICAL DATA:  Altered mental status EXAM: CT HEAD WITHOUT CONTRAST TECHNIQUE: Contiguous axial images were obtained from the base of the skull through the vertex without intravenous contrast. COMPARISON:  07/02/2015 FINDINGS: Brain: No evidence of acute infarction, hemorrhage, hydrocephalus, extra-axial collection or mass lesion/mass effect. Encephalomalacic changes in the right parietal lobe (series  2/image 24), new from remote prior, but chronic. Encephalomalacic changes in the bilateral anterior temporal lobes, right greater than left, chronic. Subcortical white matter and periventricular small vessel ischemic changes. Vascular: Bilateral ICA and right M1 aneurysm clips with right ICA stent. Skull: Normal. Negative for fracture or focal lesion. Sinuses/Orbits: The visualized paranasal sinuses are essentially clear. The mastoid air cells are unopacified. Other: None. IMPRESSION: No evidence of acute intracranial abnormality. Encephalomalacic changes, as above. Right ICA stent with bilateral aneurysm clips. Small vessel ischemic changes. Electronically Signed   By: Charline Bills M.D.   On: 01/08/2021 22:53   CT ANGIO NECK W OR WO CONTRAST  Result Date: 01/09/2021 CLINICAL DATA:  Acute neuro deficit. Headache and aphasia 5 days. History of cerebral aneurysm clipping EXAM: CT ANGIOGRAPHY HEAD AND NECK TECHNIQUE: Multidetector CT imaging of the head and neck was performed using the standard protocol during bolus administration of intravenous contrast. Multiplanar CT image reconstructions and MIPs were obtained to evaluate the vascular anatomy. Carotid stenosis measurements (when applicable) are obtained utilizing NASCET criteria, using the distal internal carotid diameter as the denominator. CONTRAST:  77mL OMNIPAQUE IOHEXOL 350 MG/ML SOLN COMPARISON:  CT angio head 01/27/2013.  CT head 01/08/2021 FINDINGS: CTA NECK FINDINGS Aortic arch: Mild atherosclerotic disease aortic arch and proximal great vessels. Proximal great vessels widely patent. Right carotid system: Mild atherosclerotic disease right carotid bifurcation. No significant carotid stenosis on the right. Left carotid system: Mild atherosclerotic calcification left common carotid artery and left carotid bifurcation without significant stenosis. Vertebral arteries: Both vertebral arteries patent to the skull base without stenosis. Skeleton: Cervical  spondylosis, mild-to-moderate. No acute skeletal abnormality. Other neck: Negative for mass or adenopathy in the neck. Upper chest: Lung apices clear bilaterally. Review of the MIP images confirms the above findings CTA HEAD FINDINGS Anterior circulation: Bilateral pterional craniotomy for aneurysm clipping bilaterally. In addition, there is a stent in the right cavernous carotid extending into the right M1 segment. Stent and clips unchanged from the prior CTA 2014. Right carotid artery stent is patent. There is atherosclerotic calcification in the wall of the supraclinoid internal carotid artery with fusiform aneurysmal dilatation near the stent.  This measures up to 8.8 mm in diameter, unchanged from the prior study. There is contrast external to the stent in the aneurysm sac, unchanged from the prior CTA. There is fusiform aneurysmal dilatation of the distal right M1 segment at the bifurcation, unchanged from the prior CTA 2014. There is adjacent aneurysm clip. Aneurysm clip also present in the region of the right posterior communicating artery origin without residual aneurysm. Right anterior cerebral artery widely patent. Left anterior cerebral artery widely patent. Aneurysm clip in the region of the terminal left internal carotid artery without residual aneurysm. No change from prior study. Left middle cerebral artery patent without aneurysm or stenosis. Posterior circulation: Widely patent posterior circulation. No aneurysm. Venous sinuses: Normal venous enhancement. Anatomic variants: None Review of the MIP images confirms the above findings IMPRESSION: 1. No acute abnormality and no change from CTA 2014. 2. Fusiform aneurysm of the supraclinoid internal carotid artery on the right, unchanged from the prior study. There has been stenting of this aneurysm. There remains contrast outside of the stent unchanged from 2014. Aneurysmal dilatation distal to the stent also unchanged from the prior study. Aneurysm clip  right MCA bifurcation and right PCA origin without change from the prior study. 3. Aneurysm clip left carotid terminus without recurrent aneurysm, unchanged from the prior study. Electronically Signed   By: Marlan Palau M.D.   On: 01/09/2021 08:17   US Venous Img Lower Bilateral  Result Date: 01/08/2021 CLINICAL DATA:  Right lower extremity edema. EXAM: BILATERAL LOWER EXTREMITY VENOUS DOPPLER ULTRASOUND TECHNIQUE: Gray-scale sonography with compression, as well as color and duplex ultrasound, were performed to evaluate the deep venous system(s) from the level of the common femoral vein through the popliteal and proximal calf veins. COMPARISON:  None. FINDINGS: VENOUS Normal compressibility of the common femoral, superficial femoral, and popliteal veins, as well as the visualized calf veins. Visualized portions of profunda femoral vein and great saphenous vein unremarkable. No filling defects to suggest DVT on grayscale or color Doppler imaging. Doppler waveforms show normal direction of venous flow, normal respiratory plasticity and response to augmentation. Limited views of the contralateral common femoral vein are unremarkable. OTHER A 3.3 cm x 0.7 cm x 2.1 cm right groin lymph node is noted. Limitations: Limited study secondary to the patient's body habitus and limited patient movement. IMPRESSION: No evidence of DVT within the RIGHT or LEFT lower extremity. Electronically Signed   By: Aram Candela M.D.   On: 01/08/2021 21:02   DG Chest Portable 1 View  Result Date: 01/08/2021 CLINICAL DATA:  Aphasia, evaluate for occult infection EXAM: PORTABLE CHEST 1 VIEW COMPARISON:  12/19/2018 FINDINGS: Lungs are clear.  No pleural effusion or pneumothorax. The heart is top-normal in size. IMPRESSION: No evidence of acute cardiopulmonary disease. Electronically Signed   By: Charline Bills M.D.   On: 01/08/2021 22:54   DG Foot 2 Views Right  Result Date: 01/09/2021 CLINICAL DATA:  Leg swelling and  restlessness with no known injury EXAM: RIGHT FOOT - 2 VIEW COMPARISON:  None. FINDINGS: Extensive lobulation and thickening of the skin, often stasis related. Postoperative second and third metatarsal necks and postoperative or posttraumatic deformity of the first metatarsal. Remote ankle fracture fixation with probable tibiotalar arthrodesis. Osteopenia is pronounced. No acute fracture or subluxation. IMPRESSION: 1. No acute finding. 2. Chronic findings are described above. Electronically Signed   By: Marnee Spring M.D.   On: 01/09/2021 07:02   EEG adult  Result Date: 01/09/2021 Charlsie Quest, MD  01/09/2021 10:16 AM Patient Name: [name] MRN: [id number] Epilepsy Attending: Charlsie Quest Referring Physician/Provider: Dr Erick Blinks Date: 01/09/2021 Duration: 22.33 mins Patient history: 70 year old female with history of seizures on phenobarbital, remote aneurysmal subarachnoid hemorrhage status post clipping and stenting of multiple aneurysms and chronic bitemporal encephalomalacia and right parietal stroke who presented with aphasia and encephalopathy.  EEG to evaluate for seizures. Level of alertness: Awake, asleep AEDs during EEG study: GBP, phenobarb Technical aspects: This EEG study was done with scalp electrodes positioned according to the 10-20 International system of electrode placement. Electrical activity was acquired at a sampling rate of  and reviewed with a high frequency filter of  and a low frequency filter of . EEG data were recorded continuously and digitally stored. Description: The posterior dominant rhythm consists of 6 Hz activity of moderate voltage (25-35 uV) seen predominantly in posterior head regions, symmetric and reactive to eye opening and eye closing. Sleep was characterized by vertex waves, sleep spindles (12 to 14 Hz), maximal frontocentral region.  EEG showed continuous generalized 5 to 6 Hz theta as well as intermittent generalized 2 to 3 Hz delta  slowing. Hyperventilation and photic stimulation were not performed.   ABNORMALITY - Continuous slow, generalized - Background slow IMPRESSION: This study is suggestive of moderate diffuse encephalopathy, nonspecific etiology. No seizures or epileptiform discharges were seen throughout the recording. Priyanka Annabelle Harman     LOS: 0 days   Jeoffrey Massed, MD  [facility]    To contact the attending provider between 7A-7P or the covering provider during after hours 7P-7A, please log into the web site [email or web address] and access using universal [place] password for that web site. If you do not have the password, please call the hospital operator.  01/09/2021, 10:21 AM

## 2021-01-09 NOTE — ED Notes (Signed)
Delay in CT as patient has struggled with her restless legs. CT will be notified when patient is more comfortable. Mirapex and Phenobarbital given.

## 2021-01-09 NOTE — ED Provider Notes (Signed)
Transferred to CTA  Has cellulitis of BLE and will need admission  NCAT EOMI RRR CTAB NABS Swollen warm erythematous BLE  Results for orders placed or performed during the hospital encounter of 01/08/21  Resp Panel by RT-PCR (Flu A&B, Covid) Peripheral   Specimen: Peripheral; Nasopharyngeal(NP) swabs in vial transport medium  Result Value Ref Range   SARS Coronavirus 2 by RT PCR NEGATIVE NEGATIVE   Influenza A by PCR NEGATIVE NEGATIVE   Influenza B by PCR NEGATIVE NEGATIVE  CBC with Differential  Result Value Ref Range   WBC 10.7 (H) 4.0 - 10.5 K/uL   RBC 3.49 (L) 3.87 - 5.11 MIL/uL   Hemoglobin 11.3 (L) 12.0 - 15.0 g/dL   HCT 16.1 (L) 09.6 - 04.5 %   MCV 93.7 80.0 - 100.0 fL   MCH 32.4 26.0 - 34.0 pg   MCHC 34.6 30.0 - 36.0 g/dL   RDW 40.9 81.1 - 91.4 %   Platelets 197 150 - 400 K/uL   nRBC 0.0 0.0 - 0.2 %   Neutrophils Relative % 78 %   Neutro Abs 8.4 (H) 1.7 - 7.7 K/uL   Lymphocytes Relative 14 %   Lymphs Abs 1.5 0.7 - 4.0 K/uL   Monocytes Relative 7 %   Monocytes Absolute 0.7 0.1 - 1.0 K/uL   Eosinophils Relative 0 %   Eosinophils Absolute 0.0 0.0 - 0.5 K/uL   Basophils Relative 0 %   Basophils Absolute 0.0 0.0 - 0.1 K/uL   Immature Granulocytes 1 %   Abs Immature Granulocytes 0.07 0.00 - 0.07 K/uL  Comprehensive metabolic panel  Result Value Ref Range   Sodium 135 135 - 145 mmol/L   Potassium 3.4 (L) 3.5 - 5.1 mmol/L   Chloride 99 98 - 111 mmol/L   CO2 24 22 - 32 mmol/L   Glucose, Bld 99 70 - 99 mg/dL   BUN 13 8 - 23 mg/dL   Creatinine, Ser 7.82 0.44 - 1.00 mg/dL   Calcium 9.6 8.9 - 95.6 mg/dL   Total Protein 7.5 6.5 - 8.1 g/dL   Albumin 3.5 3.5 - 5.0 g/dL   AST 24 15 - 41 U/L   ALT 16 0 - 44 U/L   Alkaline Phosphatase 83 38 - 126 U/L   Total Bilirubin 1.4 (H) 0.3 - 1.2 mg/dL   GFR, Estimated >21 >30 mL/min   Anion gap 12 5 - 15  Lactic acid, plasma  Result Value Ref Range   Lactic Acid, Venous 1.2 0.5 - 1.9 mmol/L  Lipase, blood  Result Value Ref  Range   Lipase <10 (L) 11 - 51 U/L  Protime-INR  Result Value Ref Range   Prothrombin Time 14.8 11.4 - 15.2 seconds   INR 1.2 0.8 - 1.2  POC CBG, ED  Result Value Ref Range   Glucose-Capillary 90 70 - 99 mg/dL   CT HEAD WO CONTRAST ( )  Result Date: 01/08/2021 CLINICAL DATA:  Altered mental status EXAM: CT HEAD WITHOUT CONTRAST TECHNIQUE: Contiguous axial images were obtained from the base of the skull through the vertex without intravenous contrast. COMPARISON:  07/02/2015 FINDINGS: Brain: No evidence of acute infarction, hemorrhage, hydrocephalus, extra-axial collection or mass lesion/mass effect. Encephalomalacic changes in the right parietal lobe (series 2/image 24), new from remote prior, but chronic. Encephalomalacic changes in the bilateral anterior temporal lobes, right greater than left, chronic. Subcortical white matter and periventricular small vessel ischemic changes. Vascular: Bilateral ICA and right M1 aneurysm clips with right ICA stent. Skull:  Normal. Negative for fracture or focal lesion. Sinuses/Orbits: The visualized paranasal sinuses are essentially clear. The mastoid air cells are unopacified. Other: None. IMPRESSION: No evidence of acute intracranial abnormality. Encephalomalacic changes, as above. Right ICA stent with bilateral aneurysm clips. Small vessel ischemic changes. Electronically Signed   By: Charline Bills M.D.   On: 01/08/2021 22:53   US Venous Img Lower Bilateral  Result Date: 01/08/2021 CLINICAL DATA:  Right lower extremity edema. EXAM: BILATERAL LOWER EXTREMITY VENOUS DOPPLER ULTRASOUND TECHNIQUE: Gray-scale sonography with compression, as well as color and duplex ultrasound, were performed to evaluate the deep venous system(s) from the level of the common femoral vein through the popliteal and proximal calf veins. COMPARISON:  None. FINDINGS: VENOUS Normal compressibility of the common femoral, superficial femoral, and popliteal veins, as well as the  visualized calf veins. Visualized portions of profunda femoral vein and great saphenous vein unremarkable. No filling defects to suggest DVT on grayscale or color Doppler imaging. Doppler waveforms show normal direction of venous flow, normal respiratory plasticity and response to augmentation. Limited views of the contralateral common femoral vein are unremarkable. OTHER A 3.3 cm x 0.7 cm x 2.1 cm right groin lymph node is noted. Limitations: Limited study secondary to the patient's body habitus and limited patient movement. IMPRESSION: No evidence of DVT within the RIGHT or LEFT lower extremity. Electronically Signed   By: Aram Candela M.D.   On: 01/08/2021 21:02   DG Chest Portable 1 View  Result Date: 01/08/2021 CLINICAL DATA:  Aphasia, evaluate for occult infection EXAM: PORTABLE CHEST 1 VIEW COMPARISON:  12/19/2018 FINDINGS: Lungs are clear.  No pleural effusion or pneumothorax. The heart is top-normal in size. IMPRESSION: No evidence of acute cardiopulmonary disease. Electronically Signed   By: Charline Bills M.D.   On: 01/08/2021 22:54      Zack Crager, MD 01/09/21 [id number]

## 2021-01-09 NOTE — Progress Notes (Signed)
EEG complete - results pending 
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
## 2021-01-09 NOTE — Progress Notes (Signed)
Pharmacy Antibiotic Note  [name] is a 70 y.o. female admitted on 01/08/2021 with cellulitis.  Pharmacy has been consulted for vancomycin and cefepime dosing.  Plan: Vancomycin 2000mg  x1 then 1500mg  IV Q24H. Goal AUC 400-550.  Expected AUC 525.  SCr 0.93  Cefepime 2g IV Q8H.  Height: 5\' 2"  (157.5 cm) Weight: 136.1 kg (300 lb) IBW/kg (Calculated) : 50.1  Temp (24hrs), Avg:101.2 F (38.4 C), Min:100.1 F (37.8 C), Max:102.7 F (39.3 C)  Recent Labs  Lab 01/08/21 2148 01/08/21 2253  WBC 10.7*  --   CREATININE 0.93  --   LATICACIDVEN  --  1.2    Estimated Creatinine Clearance: 76.2 mL/min (by C-G formula based on SCr of 0.93 mg/dL).    Allergies  Allergen Reactions   Compazine Shortness Of Breath   Prochlorperazine Maleate Shortness Of Breath   Topamax [Topiramate] Hives and Rash   Codeine Sulfate Nausea Only   Seasonal Ic [Cholestatin] Hives   Adhesive [Tape] Rash   Iodinated Diagnostic Agents Rash    Uncoded Allergy. Allergen: contrast dyes     Thank you for allowing pharmacy to be a part of this patient's care.  01/10/21, PharmD, BCPS  01/09/2021 5:14 AM

## 2021-01-09 NOTE — Procedures (Signed)
Patient Name: [name]  MRN: [id number]  Epilepsy Attending: Charlsie Quest  Referring Physician/Provider: Dr Erick Blinks Date: 01/09/2021  Duration: 22.33 mins  Patient history: 70 year old female with history of seizures on phenobarbital, remote aneurysmal subarachnoid hemorrhage status post clipping and stenting of multiple aneurysms and chronic bitemporal encephalomalacia and right parietal stroke who presented with aphasia and encephalopathy.  EEG to evaluate for seizures.  Level of alertness: Awake, asleep  AEDs during EEG study: GBP, phenobarb  Technical aspects: This EEG study was done with scalp electrodes positioned according to the 10-20 International system of electrode placement. Electrical activity was acquired at a sampling rate of 500Hz  and reviewed with a high frequency filter of 70Hz  and a low frequency filter of 1Hz . EEG data were recorded continuously and digitally stored.   Description: The posterior dominant rhythm consists of 6 Hz activity of moderate voltage (25-35 uV) seen predominantly in posterior head regions, symmetric and reactive to eye opening and eye closing. Sleep was characterized by vertex waves, sleep spindles (12 to 14 Hz), maximal frontocentral region.  EEG showed continuous generalized 5 to 6 Hz theta as well as intermittent generalized 2 to 3 Hz delta slowing. Hyperventilation and photic stimulation were not performed.     ABNORMALITY - Continuous slow, generalized - Background slow  IMPRESSION: This study is suggestive of moderate diffuse encephalopathy, nonspecific etiology. No seizures or epileptiform discharges were seen throughout the recording.  Quin Mcpherson 

## 2021-01-09 NOTE — Progress Notes (Signed)
Neurology Update  Please see full consult note from earlier today by Dr. Derry Lory. Interval results:  CTA H&N 1. No acute abnormality and no change from CTA 2014. 2. Fusiform aneurysm of the supraclinoid internal carotid artery on the right, unchanged from the prior study. There has been stenting of this aneurysm. There remains contrast outside of the stent unchanged from 2014. Aneurysmal dilatation distal to the stent also unchanged from the prior study. Aneurysm clip right MCA bifurcation and right PCA origin without change from the prior study. 3. Aneurysm clip left carotid terminus without recurrent aneurysm, unchanged from the prior study.  rEEG showed diffuse slowing without epileptiform abnl  Per chart review, patient's encephalopathy has improved since starting abx although she is not yet back to baseline. Anticipate that her encephalopathy will resolve with complete course of treatment for her infection. No further inpatient neurologic workup indicated at this time. Neurology will not continue to actively follow, but please re-engage if new neurologic concerns arise or if there are persistent neurologic deficits beyond baseline after she has completed antibiotics course. She should be referred to outpatient neurology at hospital discharge for full stroke workup for noted remote R parietal stroke.  Shelley Neighbors, MD [facility] [phone]  If 7pm- 7am, please page neurology on call as listed in AMION.

## 2021-01-09 NOTE — Evaluation (Signed)
Occupational Therapy Evaluation Patient Details Name: [name] MRN: [id number] DOB: 05-Apr-1951 Today's Date: 01/09/2021    History of Present Illness 70 y.o. female with history of sleep apnea, seizures, hypertension, chronic lymphedema of the lower extremity, hyperlipidemia who has had prior history of craniotomy for subarachnoid hemorrhage with aneurysmal clipping, brought to the ER due to increasing confusion.  Neuro consulted and signed off: "No acute abnormality and no change from CTA."  Diagnosis with "confusion-and sepsis physiology secondary to RLE cellulitis."   Clinical Impression   Patient admitted for the diagnosis above.  PTA she lives with her spouse, who can provide supportive assistance, but cannot physically assist.  Spouse needs the patient to be able to stand from an elevated sitting position, and walk to the garage/car, which is about 46' at [facility] level.  Deficits are listed below.  Currently she is needing up to Mod A for bed mobility, and Min guard for sit to stand and lateral steps to the head of the bed.  She is needing increased assist for lower body ADL, but she uses a hip kit at baseline.  OT will follow in the acute setting, but she is hoping to transition home with [facility] services.      Follow Up Recommendations  Home health OT    Equipment Recommendations  None recommended by OT    Recommendations for Other Services       Precautions / Restrictions Precautions Precautions: Fall Precaution Comments: difficulty moving/bending R knee Restrictions Weight Bearing Restrictions: No      Mobility Bed Mobility Overal bed mobility: Needs Assistance Bed Mobility: Supine to Sit;Sit to Supine     Supine to sit: Mod assist Sit to supine: Mod assist   General bed mobility comments: assist to advance R leg, elevate trunk, bring feet back up onto the bed.    Transfers Overall transfer level: Needs assistance Equipment used: Rolling walker (2 wheeled) Transfers:  Sit to/from [facility] Sit to Stand: Min guard;From elevated surface Stand pivot transfers: Min guard            Balance Overall balance assessment: Needs assistance Sitting-balance support: Single extremity supported;Feet supported Sitting balance-Leahy Scale: Fair     Standing balance support: Bilateral upper extremity supported Standing balance-Leahy Scale: Poor Standing balance comment: needs RW for support                           ADL either performed or assessed with clinical judgement   ADL Overall ADL's : Needs assistance/impaired     Grooming: Wash/dry hands;Wash/dry face;Set up;Sitting           Upper Body Dressing : Minimal assistance;Sitting   Lower Body Dressing: Moderate assistance;Sit to/from stand Lower Body Dressing Details (indicate cue type and reason): uses hip kit at baseline.             Functional mobility during ADLs: Min guard;Rolling walker General ADL Comments: side steps to the head of the bed with RW     Vision Baseline Vision/History: 0 No visual deficits Patient Visual Report: No change from baseline Vision Assessment?: No apparent visual deficits     Perception     Praxis      Pertinent Vitals/Pain Pain Assessment: No/denies pain     Hand Dominance Right   Extremity/Trunk Assessment Upper Extremity Assessment Upper Extremity Assessment: Generalized weakness   Lower Extremity Assessment Lower Extremity Assessment: Defer to PT evaluation   Cervical /  Trunk Assessment Cervical / Trunk Assessment: Normal   Communication Communication Communication: No difficulties   Cognition Arousal/Alertness: Awake/alert Behavior During Therapy: WFL for tasks assessed/performed Overall Cognitive Status: Impaired/Different from baseline Area of Impairment: Following commands;Safety/judgement;Problem solving;Orientation                 Orientation Level: Person;Place;Time     Following  Commands: Follows one step commands with increased time     Problem Solving: Slow processing;Requires verbal cues     General Comments       Exercises     Shoulder Instructions      Home Living Family/patient expects to be discharged to:: Private residence Living Arrangements: Spouse/significant other Available Help at Discharge: Family;Available 24 hours/day Type of Home: House Home Access: Level entry (enters through the garage)     Home Layout: Two level;Bed/bath upstairs;1/2 bath on main level Alternate Level Stairs-Number of Steps: flight   Bathroom Shower/Tub: Occupational psychologist: Handicapped height Bathroom Accessibility: Yes How Accessible: Accessible via walker Home Equipment: Cherokee - 2 wheels;Cane - single point;Shower seat;Grab bars - tub/shower;Hand held shower head;Transport chair;Adaptive equipment;Bedside commode;Other (comment) (lift chair) Adaptive Equipment: Reacher;Long-handled shoe horn;Long-handled sponge Additional Comments: sleeps in recliner/lift chair, walks to half bath at night.      Prior Functioning/Environment Level of Independence: Independent with assistive device(s)        Comments: walks with SPC and able to complete ADL and IADL herself.  Bathroom is on the second floor, performs sink baths in main level half bath.        OT Problem List: Decreased strength;Decreased range of motion;Decreased activity tolerance;Impaired balance (sitting and/or standing);Obesity;Decreased cognition      OT Treatment/Interventions: Self-care/ADL training;Therapeutic exercise;DME and/or AE instruction;Balance training;Patient/family education;Therapeutic activities    OT Goals(Current goals can be found in the care plan section) Acute Rehab OT Goals Patient Stated Goal: Hoping to return home OT Goal Formulation: With patient Time For Goal Achievement: 01/23/21 Potential to Achieve Goals: Good  OT Frequency: Min 2X/week   Barriers to  D/C:    none noted       Co-evaluation              AM-PAC OT "6 Clicks" Daily Activity     Outcome Measure Help from another person eating meals?: None Help from another person taking care of personal grooming?: None Help from another person toileting, which includes using toliet, bedpan, or urinal?: A Lot Help from another person bathing (including washing, rinsing, drying)?: A Lot Help from another person to put on and taking off regular upper body clothing?: A Little Help from another person to put on and taking off regular lower body clothing?: A Lot 6 Click Score: 17   End of Session Equipment Utilized During Treatment: Rolling walker Nurse Communication: Mobility status  Activity Tolerance: Patient tolerated treatment well Patient left: in bed;with call bell/phone within reach;with family/visitor present  OT Visit Diagnosis: Unsteadiness on feet (R26.81);Muscle weakness (generalized) (M62.81);Other symptoms and signs involving cognitive function                Time: 2993-7169 OT Time Calculation (min): 27 min Charges:  OT General Charges $OT Visit: 1 Visit OT Evaluation $OT Eval Moderate Complexity: 1 Mod OT Treatments $Therapeutic Activity: 8-22 mins  01/09/2021  RP, OTR/L  Acute Rehabilitation Services  Office:  [place] 01/09/2021, 4:45 PM

## 2021-01-10 LAB — CBC
HCT: 33.7 % — ABNORMAL LOW (ref 36.0–46.0)
Hemoglobin: 11.6 g/dL — ABNORMAL LOW (ref 12.0–15.0)
MCH: 32.5 pg (ref 26.0–34.0)
MCHC: 34.4 g/dL (ref 30.0–36.0)
MCV: 94.4 fL (ref 80.0–100.0)
Platelets: 291 10*3/uL (ref 150–400)
RBC: 3.57 MIL/uL — ABNORMAL LOW (ref 3.87–5.11)
RDW: 13.5 % (ref 11.5–15.5)
WBC: 12 10*3/uL — ABNORMAL HIGH (ref 4.0–10.5)
nRBC: 0 % (ref 0.0–0.2)

## 2021-01-10 LAB — BASIC METABOLIC PANEL
Anion gap: 11 (ref 5–15)
BUN: 17 mg/dL (ref 8–23)
CO2: 24 mmol/L (ref 22–32)
Calcium: 9.4 mg/dL (ref 8.9–10.3)
Chloride: 99 mmol/L (ref 98–111)
Creatinine, Ser: 1.04 mg/dL — ABNORMAL HIGH (ref 0.44–1.00)
GFR, Estimated: 58 mL/min — ABNORMAL LOW (ref >60)
Glucose, Bld: 97 mg/dL (ref 70–99)
Potassium: 3.8 mmol/L (ref 3.5–5.1)
Sodium: 134 mmol/L — ABNORMAL LOW (ref 135–145)

## 2021-01-10 LAB — URINE CULTURE

## 2021-01-10 LAB — MAGNESIUM: Magnesium: 1.7 mg/dL (ref 1.7–2.4)

## 2021-01-10 MED ORDER — ONDANSETRON HCL 4 MG/2ML IJ SOLN
4.0000 mg | Freq: Four times a day (QID) | INTRAMUSCULAR | Status: DC | PRN
  Administered 2021-01-10: 4 mg via INTRAVENOUS
  Filled 2021-01-10: qty 2

## 2021-01-10 MED ORDER — ACETAMINOPHEN 325 MG PO TABS
650.0000 mg | ORAL_TABLET | Freq: Four times a day (QID) | ORAL | Status: DC | PRN
  Administered 2021-01-10 – 2021-01-13 (×2): 650 mg via ORAL
  Filled 2021-01-10 (×3): qty 2

## 2021-01-10 MED ORDER — MAGNESIUM SULFATE 2 GM/50ML IV SOLN
2.0000 g | Freq: Once | INTRAVENOUS | Status: AC
  Administered 2021-01-10: 2 g via INTRAVENOUS
  Filled 2021-01-10: qty 50

## 2021-01-10 NOTE — Progress Notes (Signed)
Pt desats into the low 80s when asleep. Pt did not want to wear CPAP that is at bedside because she states it's not like the one she has at home and it is uncomfortable. 2 L/min O2 via [place] placed on pt while she is napping.

## 2021-01-10 NOTE — TOC Initial Note (Signed)
Transition of Care [facility]) - Initial/Assessment Note    Patient Details  Name: [name] MRN: [id number] Date of Birth: 08/13/1950  Transition of Care [facility]) CM/SW Contact:    Mearl Latin, LCSW Phone Number: 01/10/2021, 4:34 PM  Clinical Narrative:                 CSW received consult for possible SNF placement at time of discharge. CSW spoke with patient and spouse at bedside. Patient reported that patient's spouse is currently unable to care for patient at their home given patient's current physical needs and fall risk. Patient expressed understanding of PT recommendation and is agreeable to SNF placement at time of discharge. Patient reports preference for [facility] [place] as her granddaughter works there. CSW spoke with admissions at [facility] but they do not have any quarantine beds available. CSW waiting on other bed offers. CSW discussed insurance authorization process and will provide Medicare SNF ratings list. Patient has received 2 COVID vaccines. She has a home cpap machine that she can bring to SNF. Patient expressed being hopeful for rehab and to feel better soon. No further questions reported at this time.   Skilled Nursing Rehab Facilities-   [email or web address] Ratings out of 5 possible    Name Address  Phone # Quality Care Staffing Health Inspection Overall  [facility] [address], [place] [phone] 5 1 4 4   [facility]  [address], [place] [phone] 3 1 5 4   [facility] [address] [place], [address] [place] 3 1 1 1   [facility] [address] 2 2 4 4   [facility] [address] [phone] 3 1 2 1   [facility] [phone] [address] [phone] 3 2 4 4   [facility] [address], [address] [place] 5 1 2 2   [facility] [address], [facility] [place] 5 2 2 3   [facility] [address] [facility] 5 1 2 2   [facility] [phone] [address], [phone] 5 1 2 2   [facility] [address] [facility] [phone] 5 1 2 2   [facility]. [address] 3 1 1 1      Expected Discharge Plan: Skilled Nursing Facility Barriers to Discharge: Continued Medical Work up, SNF Pending bed offer, Insurance Authorization   Patient Goals and CMS Choice Patient states their goals for this hospitalization and ongoing recovery are:: Rehab to feel better. CMS Medicare.gov Compare Post Acute Care list provided to:: Patient Choice offered to / list presented to : Spouse, Patient  Expected Discharge Plan and Services Expected Discharge Plan: Skilled Nursing Facility In-house Referral: Clinical Social Work   Post Acute Care Choice: Skilled Nursing Facility Living arrangements for the past 2 months: Single Family Home                                      Prior Living Arrangements/Services Living arrangements for the past 2 months: Single Family Home Lives with:: Spouse Patient language and need for interpreter reviewed:: Yes        Need for Family Participation in Patient Care: Yes (Comment) Care giver support system in place?: Yes (comment) Current home services: DME Criminal Activity/Legal Involvement Pertinent to Current Situation/Hospitalization: No - Comment as needed  Activities of Daily Living Home Assistive Devices/Equipment: [facility] (  specify type), Cane (specify quad or straight), Built-in shower seat ADL Screening (condition at time of admission) Patient's cognitive ability adequate to safely complete daily activities?: Yes Is the patient deaf or have difficulty hearing?: No Does the patient have difficulty seeing, even when wearing glasses/contacts?: No Does the patient have difficulty concentrating, remembering, or making decisions?: No Patient able to  express need for assistance with ADLs?: Yes Does the patient have difficulty dressing or bathing?: No Independently performs ADLs?: Yes (appropriate for developmental age) Does the patient have difficulty walking or climbing stairs?: Yes Weakness of Legs: Both Weakness of Arms/Hands: None  Permission Sought/Granted      Share Information with NAME: Rochele Pages  Permission granted to share info w AGENCY: SNFS  Permission granted to share info w Relationship: Spouse  Permission granted to share info w Contact Information: [phone]  Emotional Assessment Appearance:: Developmentally appropriate Attitude/Demeanor/Rapport: Other (comment) (Fidgety) Affect (typically observed): Accepting, Appropriate Orientation: : Oriented to Self, Oriented to Place, Oriented to  Time, Oriented to Situation Alcohol / Substance Use: Not Applicable Psych Involvement: No (comment)  Admission diagnosis:  Aphasia [R47.01] Cellulitis [L03.90] Pain [R52] SIRS (systemic inflammatory response syndrome) (HCC) [R65.10] Cellulitis of lower extremity, unspecified laterality [L03.119] Patient Active Problem List   Diagnosis Date Noted   SIRS (systemic inflammatory response syndrome) (HCC) 01/09/2021   Acute encephalopathy 01/09/2021   Hyponatremia 12/20/2018   Cellulitis 12/20/2018   Cellulitis of left lower extremity 12/19/2018   Hypokalemia 02/17/2017   DOE (dyspnea on exertion) 01/08/2017   Medication management 01/08/2017   Weight gain 01/08/2017   Lymphedema of right lower extremity 10/22/2016   Wound dehiscence, surgical, subsequent encounter 05/12/2016   S/P ankle arthrodesis 03/12/2016   OSA on CPAP 09/10/2015   Nocturia more than twice per night 09/10/2015   Morbid obesity due to excess calories (HCC) 09/10/2015   Loss of consciousness (HCC) 08/15/2015   Fracture of ankle, trimalleolar, closed 07/03/2015   Syncope 07/02/2015   Seizure disorder (HCC) 07/02/2015   Essential hypertension  07/02/2015   Bilateral lower extremity edema 07/02/2015   Closed right ankle fracture 07/02/2015   GERD (gastroesophageal reflux disease) 07/02/2015   HLD (hyperlipidemia) 07/02/2015   Anosmia/chronic 07/02/2015   Leukocytosis 07/02/2015   Restless legs syndrome (RLS) 04/28/2013   Sleep apnea with use of continuous positive airway pressure (CPAP) 01/24/2013   PCP:  [name], MD Pharmacy:   [phone], [place] - [address] [place] [address] Phone: [phone] Fax: [phone]     Social Determinants of Health (SDOH) Interventions    Readmission Risk Interventions No flowsheet data found.

## 2021-01-10 NOTE — Progress Notes (Signed)
PROGRESS NOTE        PATIENT DETAILS Name: [name] Age: 70 y.o. Sex: female Date of Birth: Apr 08, 1951 Admit Date: 01/08/2021 Admitting Physician Eduard Clos, MD ZOX:[id number], Jasmine December, MD  Brief Narrative: Patient is a 70 y.o. female with history of chronic bilateral lower extremity lymphedema, seizures, OSA (noncompliant to CPAP) HLD, SAH-s/p craniotomy and aneurysm clipping-presented with confusion-and sepsis physiology secondary to RLE cellulitis.  Significant events: 8/31>> fever-confusion-RLE cellulitis-admit to [facility].  Significant studies: 8/31>> CXR: No pneumonia. 8/31>> Doppler bilateral lower extremity: No DVT 8/31>> CT head: No acute intracranial abnormality. 9/01>> CT angio head/neck: No acute abnormality-no change from CTA done in 2014 (fusiform aneurysm of the supraclinoid ICA on right-s/p stenting, aneurysmal disease dilatation distal to the stent, aneurysmal clip at the right MCA bifurcation, right PCA origin). 9/01>> x-ray right tibia/fibula: No acute finding. 9/01>> x-ray right foot: No acute finding. 9/01>> EEG: No seizures  Antimicrobial therapy: Vancomycin: 8/31>> Cefepime: 8/31>>9/1  Microbiology data: 8/31>> influenza/COVID PCR: 8/31>> blood culture: no growth 9/01>> urine culture: no growth  Procedures : None  Consults: Neurology  DVT Prophylaxis : Prophylactic Lovenox  Subjective: Less erythema RLE today.  Seems to be more awake/alert and much more fluent compared to yesterday.  No family at bedside but suspect she is still not yet at the baseline although better than yesterday.   Assessment/Plan: Sepsis due to RLE cellulitis: Sepsis physiology has resolved-RLE less erythematous-has lymphedema at baseline.  Continue IV vancomycin-okay to stop cefepime.  Blood cultures negative so far.  Acute metabolic encephalopathy: Likely due to sepsis-slowly improving.  Neuroimaging/EEG without seizures.  Neurology  following with recommendations to continue supportive care.   Seizure disorder: EEG negative-continue phenobarbital.  HTN: BP stable-continue ARB/amlodipine  HLD: Continue statin  HFpEF/bilateral lower extremity lymphedema: Volume status stable-unchanged-continue Aldactone/metolazone.  Per patient she is noncompliant with leg compression therapy.  History of rosacea: On doxycycline  OSA: CPAP at night-but has been noncompliant lately  Morbid Obesity: Estimated body mass index is 54.87 kg/m as calculated from the following:   Height as of this encounter:  (1.575 m).   Weight as of this encounter: 136.1 kg.     Diet: Diet Order             Diet Heart Room service appropriate? Yes with Assist; Fluid consistency: Thin; Fluid restriction: 1200 mL Fluid  Diet effective now                    Code Status: Full code   Family Communication: None at bedside this morning.  Disposition Plan: Status is: Inpatient  Remains inpatient appropriate because:Inpatient level of care appropriate due to severity of illness  Dispo: The patient is from: Home              Anticipated d/c is to: Home              Patient currently is not medically stable to d/c.   Difficult to place patient No    Barriers to Discharge: Improving sepsis physiology-resolving cellulitis-slowly improving mentation-not yet ready for discharge-suspect requires another 1-2 days.-PT recommending SNF.  Antimicrobial agents: Anti-infectives (From admission, onward)    Start     Dose/Rate Route Frequency Ordered Stop   01/10/21 0000  vancomycin (VANCOREADY) IVPB 1500 mg/300 mL        1,500 mg 150  mL/hr over 120 Minutes Intravenous Every 24 hours 01/09/21 0514     01/09/21 1200  doxycycline (VIBRAMYCIN) 50 MG capsule 50 mg        50 mg Oral Every 24 hours 01/09/21 0905     01/09/21 1000  doxycycline (VIBRAMYCIN) 50 MG capsule 50 mg  Status:  Discontinued        50 mg Oral Daily 01/09/21 0904 01/09/21  0905   01/09/21 0600  ceFEPIme (MAXIPIME) 2 g in sodium chloride 0.9 % 100 mL IVPB  Status:  Discontinued        2 g 200 mL/hr over 30 Minutes Intravenous Every 8 hours 01/09/21 0514 01/10/21 1040   01/09/21 0445  vancomycin (VANCOCIN) IVPB 1000 mg/200 mL premix  Status:  Discontinued        1,000 mg 200 mL/hr over 60 Minutes Intravenous  Once 01/09/21 0444 01/09/21 0448   01/09/21 0445  ceFEPIme (MAXIPIME) 2 g in sodium chloride 0.9 % 100 mL IVPB  Status:  Discontinued        2 g 200 mL/hr over 30 Minutes Intravenous  Once 01/09/21 0444 01/09/21 0514   01/09/21 0115  vancomycin (VANCOREADY) IVPB 2000 mg/400 mL        2,000 mg 200 mL/hr over 120 Minutes Intravenous  Once 01/09/21 0059 01/09/21 0423   01/09/21 0100  piperacillin-tazobactam (ZOSYN) IVPB 3.375 g        3.375 g 100 mL/hr over 30 Minutes Intravenous  Once 01/09/21 0059 01/09/21 0211        Time spent: 35 minutes-Greater than 50% of this time was spent in counseling, explanation of diagnosis, planning of further management, and coordination of care.  MEDICATIONS: Scheduled Meds:  amLODipine  5 mg Oral Daily   aspirin EC  81 mg Oral BID   doxycycline  50 mg Oral Q24H   enoxaparin (LOVENOX) injection  60 mg Subcutaneous Q24H   ferrous sulfate  650 mg Oral Q breakfast   gabapentin  200 mg Oral TID   hydrOXYzine  25 mg Oral Daily   irbesartan  75 mg Oral BID   magnesium oxide  1,200 mg Oral QHS   metolazone  5 mg Oral Daily   montelukast  10 mg Oral QHS   pantoprazole  40 mg Oral QHS   PHENobarbital  129.6 mg Oral QHS   potassium chloride SA  80 mEq Oral Daily   pramipexole  0.25 mg Oral Q6H   rosuvastatin  20 mg Oral Daily   spironolactone  50 mg Oral Daily   torsemide  20 mg Oral Daily   zinc sulfate  220 mg Oral Daily   Continuous Infusions:  vancomycin Stopped (01/10/21 0715)   PRN Meds:.   PHYSICAL EXAM: Vital signs: Vitals:   01/10/21 0813 01/10/21 0830 01/10/21 1202 01/10/21 1228  BP: (!) 132/52   126/61   Pulse: 81  80   Resp: (!) 25  20   Temp:  98.7 F (37.1 C) 98.5 F (36.9 C)   TempSrc:  Oral Oral   SpO2: 95%  (!) 89% (!) 85%  Weight:      Height:       Filed Weights   01/08/21 1838  Weight: 136.1 kg   Body mass index is 54.87 kg/m.   Gen Exam:Alert awake-not in any distress HEENT:atraumatic, normocephalic Chest: B/L clear to auscultation anteriorly CVS:S1S2 regular Abdomen:soft non tender, non distended Extremities: Bilateral lower extremity lymphedema (right> left)-decrease erythrema in the RLE. Neurology: Non focal Skin:  no rash   I have personally reviewed following labs and imaging studies  LABORATORY DATA: CBC: Recent Labs  Lab 01/08/21 2148 01/09/21 0747 01/10/21 1118  WBC 10.7* 9.7 12.0*  NEUTROABS 8.4*  --   --   HGB 11.3* 9.7* 11.6*  HCT 32.7* 29.1* 33.7*  MCV 93.7 95.7 94.4  PLT 197 168 291     Basic Metabolic Panel: Recent Labs  Lab 01/08/21 2148 01/09/21 0747 01/10/21 1118  NA 135 133* 134*  K 3.4* 3.4* 3.8  CL 99 103 99  CO2 24 20* 24  GLUCOSE 99 135* 97  BUN 13 13 17   CREATININE 0.93 0.98 1.04*  CALCIUM 9.6 8.6* 9.4  MG  --   --  1.7     GFR: Estimated Creatinine Clearance: 68.1 mL/min (A) (by C-G formula based on SCr of 1.04 mg/dL (H)).  Liver Function Tests: Recent Labs  Lab 01/08/21 2148  AST 24  ALT 16  ALKPHOS 83  BILITOT 1.4*  PROT 7.5  ALBUMIN 3.5    Recent Labs  Lab 01/08/21 2148  LIPASE <10*    No results for input(s): AMMONIA in the last 168 hours.  Coagulation Profile: Recent Labs  Lab 01/08/21 2253  INR 1.2     Cardiac Enzymes: No results for input(s): CKTOTAL, CKMB, CKMBINDEX, TROPONINI in the last 168 hours.  BNP (last 3 results) No results for input(s): PROBNP in the last 8760 hours.  Lipid Profile: No results for input(s): CHOL, HDL, LDLCALC, TRIG, CHOLHDL, LDLDIRECT in the last 72 hours.  Thyroid Function Tests: Recent Labs    01/08/21 2343  TSH 1.641    Anemia  Panel: No results for input(s): VITAMINB12, FOLATE, FERRITIN, TIBC, IRON, RETICCTPCT in the last 72 hours.  Urine analysis:    Component Value Date/Time   COLORURINE AMBER (A) 01/09/2021 0048   APPEARANCEUR CLEAR 01/09/2021 0048   LABSPEC 1.016 01/09/2021 0048   PHURINE 6.0 01/09/2021 0048   GLUCOSEU NEGATIVE 01/09/2021 0048   HGBUR MODERATE (A) 01/09/2021 0048   BILIRUBINUR NEGATIVE 01/09/2021 0048   KETONESUR 20 (A) 01/09/2021 0048   PROTEINUR >=300 (A) 01/09/2021 0048   NITRITE NEGATIVE 01/09/2021 0048   LEUKOCYTESUR NEGATIVE 01/09/2021 0048    Sepsis Labs: Lactic Acid, Venous    Component Value Date/Time   LATICACIDVEN 1.2 01/08/2021 2253    MICROBIOLOGY: Recent Results (from the past 240 hour(s))  Blood culture (routine x 2)     Status: None (Preliminary result)   Collection Time: 01/08/21 11:29 PM   Specimen: BLOOD  Result Value Ref Range Status   Specimen Description   Final    BLOOD BOTTLES DRAWN AEROBIC ONLY Performed at [facility], [address] [place], [place]    Special Requests   Final    Blood Culture results may not be optimal due to an inadequate volume of blood received in culture bottles BLOOD RIGHT HAND Performed at [facility], [address] [place], [place]    Culture   Final    NO GROWTH < 24 HOURS Performed at [facility], [address] [place], [place]    Report Status PENDING  Incomplete  Blood culture (routine x 2)     Status: None (Preliminary result)   Collection Time: 01/08/21 11:42 PM   Specimen: BLOOD  Result Value Ref Range Status   Specimen Description   Final    BLOOD BOTTLES DRAWN AEROBIC AND ANAEROBIC Performed at [facility], [address] [place], [place] [address]    Special Requests   Final    Blood Culture adequate volume BLOOD LEFT ARM Performed at [facility], [address]  [place], [place] [address]    Culture   Final    NO GROWTH < 24 HOURS Performed at [facility], [address] [place], [place] [address]    Report Status PENDING  Incomplete  Resp Panel by RT-PCR (Flu A&B, Covid) Peripheral     Status: None   Collection Time: 01/08/21 11:43 PM   Specimen: Peripheral; Nasopharyngeal(NP) swabs in vial transport medium  Result Value Ref Range Status   SARS Coronavirus 2 by RT PCR NEGATIVE NEGATIVE Final    Comment: (NOTE) SARS-CoV-2 target nucleic acids are NOT DETECTED.  The SARS-CoV-2 RNA is generally detectable in upper respiratory specimens during the acute phase of infection. The lowest concentration of SARS-CoV-2 viral copies this assay can detect is 138 copies/mL. A negative result does not preclude SARS-Cov-2 infection and should not be used as the sole basis for treatment or other patient management decisions. A negative result may occur with  improper specimen collection/handling, submission of specimen other than nasopharyngeal swab, presence of viral mutation(s) within the areas targeted by this assay, and inadequate number of viral copies(<138 copies/mL). A negative result must be combined with clinical observations, patient history, and epidemiological information. The expected result is Negative.  Fact Sheet for Patients:  BloggerCourse.com  Fact Sheet for Healthcare Providers:  SeriousBroker.it  This test is no t yet approved or cleared by the [place] FDA and  has been authorized for detection and/or diagnosis of SARS-CoV-2 by FDA under an Emergency Use Authorization (EUA). This EUA will remain  in effect (meaning this test can be used) for the duration of the COVID-19 declaration under Section 564(b)(1) of the Act, 21 U.S.C.section 360bbb-3(b)(1), unless the authorization is terminated  or revoked sooner.       Influenza A by PCR NEGATIVE NEGATIVE Final   Influenza B  by PCR NEGATIVE NEGATIVE Final    Comment: (NOTE) The Xpert Xpress SARS-CoV-2/FLU/RSV plus assay is intended as an aid in the diagnosis of influenza from Nasopharyngeal swab specimens and should not be used as a sole basis for treatment. Nasal washings and aspirates are unacceptable for Xpert Xpress SARS-CoV-2/FLU/RSV testing.  Fact Sheet for Patients: BloggerCourse.com  Fact Sheet for Healthcare Providers: SeriousBroker.it  This test is not yet approved or cleared by the [place] FDA and has been authorized for detection and/or diagnosis of SARS-CoV-2 by FDA under an Emergency Use Authorization (EUA). This EUA will remain in effect (meaning this test can be used) for the duration of the COVID-19 declaration under Section 564(b)(1) of the Act, 21 U.S.C. section 360bbb-3(b)(1), unless the authorization is terminated or revoked.  Performed at [facility], [address], [place], [place] [address]   Urine Culture     Status: Abnormal   Collection Time: 01/09/21 12:48 AM   Specimen: Urine, Clean Catch  Result Value Ref Range Status   Specimen Description URINE, CLEAN CATCH  Final   Special Requests   Final    NONE Performed at [facility], [address], [place], [place] [address]    Culture MULTIPLE SPECIES PRESENT, SUGGEST RECOLLECTION (A)  Final   Report Status 01/10/2021 FINAL  Final  MRSA Next Gen by PCR, Nasal     Status: None   Collection Time: 01/09/21  1:11 PM  Specimen: Nasal Mucosa; Nasal Swab  Result Value Ref Range Status   MRSA by PCR Next Gen NOT DETECTED NOT DETECTED Final    Comment: (NOTE) The GeneXpert MRSA Assay (FDA approved for NASAL specimens only), is one component of a comprehensive MRSA colonization surveillance program. It is not intended to diagnose MRSA infection nor to guide or monitor treatment for MRSA infections. Test performance is not FDA approved in  patients less than 51 years old. Performed at [facility], [address], [place], [place] [address]     RADIOLOGY STUDIES/RESULTS: CT Angio Head W or Wo Contrast  Result Date: 01/09/2021 CLINICAL DATA:  Acute neuro deficit. Headache and aphasia 5 days. History of cerebral aneurysm clipping EXAM: CT ANGIOGRAPHY HEAD AND NECK TECHNIQUE: Multidetector CT imaging of the head and neck was performed using the standard protocol during bolus administration of intravenous contrast. Multiplanar CT image reconstructions and MIPs were obtained to evaluate the vascular anatomy. Carotid stenosis measurements (when applicable) are obtained utilizing NASCET criteria, using the distal internal carotid diameter as the denominator. CONTRAST:  75mL OMNIPAQUE IOHEXOL 350 MG/ML SOLN COMPARISON:  CT angio head 01/27/2013.  CT head 01/08/2021 FINDINGS: CTA NECK FINDINGS Aortic arch: Mild atherosclerotic disease aortic arch and proximal great vessels. Proximal great vessels widely patent. Right carotid system: Mild atherosclerotic disease right carotid bifurcation. No significant carotid stenosis on the right. Left carotid system: Mild atherosclerotic calcification left common carotid artery and left carotid bifurcation without significant stenosis. Vertebral arteries: Both vertebral arteries patent to the skull base without stenosis. Skeleton: Cervical spondylosis, mild-to-moderate. No acute skeletal abnormality. Other neck: Negative for mass or adenopathy in the neck. Upper chest: Lung apices clear bilaterally. Review of the MIP images confirms the above findings CTA HEAD FINDINGS Anterior circulation: Bilateral pterional craniotomy for aneurysm clipping bilaterally. In addition, there is a stent in the right cavernous carotid extending into the right M1 segment. Stent and clips unchanged from the prior CTA 2014. Right carotid artery stent is patent. There is atherosclerotic calcification in the wall of the supraclinoid  internal carotid artery with fusiform aneurysmal dilatation near the stent. This measures up to 8.8 mm in diameter, unchanged from the prior study. There is contrast external to the stent in the aneurysm sac, unchanged from the prior CTA. There is fusiform aneurysmal dilatation of the distal right M1 segment at the bifurcation, unchanged from the prior CTA 2014. There is adjacent aneurysm clip. Aneurysm clip also present in the region of the right posterior communicating artery origin without residual aneurysm. Right anterior cerebral artery widely patent. Left anterior cerebral artery widely patent. Aneurysm clip in the region of the terminal left internal carotid artery without residual aneurysm. No change from prior study. Left middle cerebral artery patent without aneurysm or stenosis. Posterior circulation: Widely patent posterior circulation. No aneurysm. Venous sinuses: Normal venous enhancement. Anatomic variants: None Review of the MIP images confirms the above findings IMPRESSION: 1. No acute abnormality and no change from CTA 2014. 2. Fusiform aneurysm of the supraclinoid internal carotid artery on the right, unchanged from the prior study. There has been stenting of this aneurysm. There remains contrast outside of the stent unchanged from 2014. Aneurysmal dilatation distal to the stent also unchanged from the prior study. Aneurysm clip right MCA bifurcation and right PCA origin without change from the prior study. 3. Aneurysm clip left carotid terminus without recurrent aneurysm, unchanged from the prior study. Electronically Signed   By: Marlan Palau M.D.   On:  01/09/2021 08:17   DG Tibia/Fibula Right  Result Date: 01/09/2021 CLINICAL DATA:  Leg swelling EXAM: RIGHT TIBIA AND FIBULA - 2 VIEW COMPARISON:  None. FINDINGS: Extensive subcutaneous reticulation and lobulated skin thickening. No evidence of fracture or subluxation. Possible tibiotalar arthrodesis. Knee osteoarthritis with joint space  narrowing and spurring. IMPRESSION: No acute finding. Electronically Signed   By: Marnee SpringJonathon  Watts M.D.   On: 01/09/2021 07:03   CT HEAD WO CONTRAST (5MM)  Result Date: 01/08/2021 CLINICAL DATA:  Altered mental status EXAM: CT HEAD WITHOUT CONTRAST TECHNIQUE: Contiguous axial images were obtained from the base of the skull through the vertex without intravenous contrast. COMPARISON:  07/02/2015 FINDINGS: Brain: No evidence of acute infarction, hemorrhage, hydrocephalus, extra-axial collection or mass lesion/mass effect. Encephalomalacic changes in the right parietal lobe (series 2/image 24), new from remote prior, but chronic. Encephalomalacic changes in the bilateral anterior temporal lobes, right greater than left, chronic. Subcortical white matter and periventricular small vessel ischemic changes. Vascular: Bilateral ICA and right M1 aneurysm clips with right ICA stent. Skull: Normal. Negative for fracture or focal lesion. Sinuses/Orbits: The visualized paranasal sinuses are essentially clear. The mastoid air cells are unopacified. Other: None. IMPRESSION: No evidence of acute intracranial abnormality. Encephalomalacic changes, as above. Right ICA stent with bilateral aneurysm clips. Small vessel ischemic changes. Electronically Signed   By: Charline BillsSriyesh  Krishnan M.D.   On: 01/08/2021 22:53   CT ANGIO NECK W OR WO CONTRAST  Result Date: 01/09/2021 CLINICAL DATA:  Acute neuro deficit. Headache and aphasia 5 days. History of cerebral aneurysm clipping EXAM: CT ANGIOGRAPHY HEAD AND NECK TECHNIQUE: Multidetector CT imaging of the head and neck was performed using the standard protocol during bolus administration of intravenous contrast. Multiplanar CT image reconstructions and MIPs were obtained to evaluate the vascular anatomy. Carotid stenosis measurements (when applicable) are obtained utilizing NASCET criteria, using the distal internal carotid diameter as the denominator. CONTRAST:  75mL OMNIPAQUE IOHEXOL 350  MG/ML SOLN COMPARISON:  CT angio head 01/27/2013.  CT head 01/08/2021 FINDINGS: CTA NECK FINDINGS Aortic arch: Mild atherosclerotic disease aortic arch and proximal great vessels. Proximal great vessels widely patent. Right carotid system: Mild atherosclerotic disease right carotid bifurcation. No significant carotid stenosis on the right. Left carotid system: Mild atherosclerotic calcification left common carotid artery and left carotid bifurcation without significant stenosis. Vertebral arteries: Both vertebral arteries patent to the skull base without stenosis. Skeleton: Cervical spondylosis, mild-to-moderate. No acute skeletal abnormality. Other neck: Negative for mass or adenopathy in the neck. Upper chest: Lung apices clear bilaterally. Review of the MIP images confirms the above findings CTA HEAD FINDINGS Anterior circulation: Bilateral pterional craniotomy for aneurysm clipping bilaterally. In addition, there is a stent in the right cavernous carotid extending into the right M1 segment. Stent and clips unchanged from the prior CTA 2014. Right carotid artery stent is patent. There is atherosclerotic calcification in the wall of the supraclinoid internal carotid artery with fusiform aneurysmal dilatation near the stent. This measures up to 8.8 mm in diameter, unchanged from the prior study. There is contrast external to the stent in the aneurysm sac, unchanged from the prior CTA. There is fusiform aneurysmal dilatation of the distal right M1 segment at the bifurcation, unchanged from the prior CTA 2014. There is adjacent aneurysm clip. Aneurysm clip also present in the region of the right posterior communicating artery origin without residual aneurysm. Right anterior cerebral artery widely patent. Left anterior cerebral artery widely patent. Aneurysm clip in the region of the terminal left  internal carotid artery without residual aneurysm. No change from prior study. Left middle cerebral artery patent without  aneurysm or stenosis. Posterior circulation: Widely patent posterior circulation. No aneurysm. Venous sinuses: Normal venous enhancement. Anatomic variants: None Review of the MIP images confirms the above findings IMPRESSION: 1. No acute abnormality and no change from CTA 2014. 2. Fusiform aneurysm of the supraclinoid internal carotid artery on the right, unchanged from the prior study. There has been stenting of this aneurysm. There remains contrast outside of the stent unchanged from 2014. Aneurysmal dilatation distal to the stent also unchanged from the prior study. Aneurysm clip right MCA bifurcation and right PCA origin without change from the prior study. 3. Aneurysm clip left carotid terminus without recurrent aneurysm, unchanged from the prior study. Electronically Signed   By: Marlan Palau M.D.   On: 01/09/2021 08:17   US Venous Img Lower Bilateral  Result Date: 01/08/2021 CLINICAL DATA:  Right lower extremity edema. EXAM: BILATERAL LOWER EXTREMITY VENOUS DOPPLER ULTRASOUND TECHNIQUE: Gray-scale sonography with compression, as well as color and duplex ultrasound, were performed to evaluate the deep venous system(s) from the level of the common femoral vein through the popliteal and proximal calf veins. COMPARISON:  None. FINDINGS: VENOUS Normal compressibility of the common femoral, superficial femoral, and popliteal veins, as well as the visualized calf veins. Visualized portions of profunda femoral vein and great saphenous vein unremarkable. No filling defects to suggest DVT on grayscale or color Doppler imaging. Doppler waveforms show normal direction of venous flow, normal respiratory plasticity and response to augmentation. Limited views of the contralateral common femoral vein are unremarkable. OTHER A 3.3 cm x 0.7 cm x 2.1 cm right groin lymph node is noted. Limitations: Limited study secondary to the patient's body habitus and limited patient movement. IMPRESSION: No evidence of DVT within  the RIGHT or LEFT lower extremity. Electronically Signed   By: Aram Candela M.D.   On: 01/08/2021 21:02   DG Chest Portable 1 View  Result Date: 01/08/2021 CLINICAL DATA:  Aphasia, evaluate for occult infection EXAM: PORTABLE CHEST 1 VIEW COMPARISON:  12/19/2018 FINDINGS: Lungs are clear.  No pleural effusion or pneumothorax. The heart is top-normal in size. IMPRESSION: No evidence of acute cardiopulmonary disease. Electronically Signed   By: Charline Bills M.D.   On: 01/08/2021 22:54   DG Foot 2 Views Right  Result Date: 01/09/2021 CLINICAL DATA:  Leg swelling and restlessness with no known injury EXAM: RIGHT FOOT - 2 VIEW COMPARISON:  None. FINDINGS: Extensive lobulation and thickening of the skin, often stasis related. Postoperative second and third metatarsal necks and postoperative or posttraumatic deformity of the first metatarsal. Remote ankle fracture fixation with probable tibiotalar arthrodesis. Osteopenia is pronounced. No acute fracture or subluxation. IMPRESSION: 1. No acute finding. 2. Chronic findings are described above. Electronically Signed   By: Marnee Spring M.D.   On: 01/09/2021 07:02   EEG adult  Result Date: 01/09/2021 Charlsie Quest, MD     01/09/2021 10:16 AM Patient Name: ALASHA MCGUINNESS MRN: [id number] Epilepsy Attending: Charlsie Quest Referring Physician/Provider: Dr Erick Blinks Date: 01/09/2021 Duration: 22.33 mins Patient history: 70 year old female with history of seizures on phenobarbital, remote aneurysmal subarachnoid hemorrhage status post clipping and stenting of multiple aneurysms and chronic bitemporal encephalomalacia and right parietal stroke who presented with aphasia and encephalopathy.  EEG to evaluate for seizures. Level of alertness: Awake, asleep AEDs during EEG study: GBP, phenobarb Technical aspects: This EEG study was done with scalp electrodes positioned  according to the 10-20 International system of electrode placement. Electrical activity  was acquired at a sampling rate of 500Hz  and reviewed with a high frequency filter of 70Hz  and a low frequency filter of 1Hz . EEG data were recorded continuously and digitally stored. Description: The posterior dominant rhythm consists of 6 Hz activity of moderate voltage (25-35 uV) seen predominantly in posterior head regions, symmetric and reactive to eye opening and eye closing. Sleep was characterized by vertex waves, sleep spindles (12 to 14 Hz), maximal frontocentral region.  EEG showed continuous generalized 5 to 6 Hz theta as well as intermittent generalized 2 to 3 Hz delta slowing. Hyperventilation and photic stimulation were not performed.   ABNORMALITY - Continuous slow, generalized - Background slow IMPRESSION: This study is suggestive of moderate diffuse encephalopathy, nonspecific etiology. No seizures or epileptiform discharges were seen throughout the recording. Priyanka     LOS: 1 day   , MD  [facility]    To contact the attending provider between 7A-7P or the covering provider during after hours 7P-7A, please log into the web site [email or web address] and access using universal  password for that web site. If you do not have the password, please call the hospital operator.  01/10/2021, 2:30 PM

## 2021-01-10 NOTE — Progress Notes (Signed)
Occupational Therapy Treatment Patient Details Name: [name] MRN: [id number] DOB: 08-May-1951 Today's Date: 01/10/2021    History of present illness Pt is 70 y.o. female presenting withconfusion and admitted with sepsis secondary to R LE cellulitis on 01/08/21.  Pt with history of sleep apnea, seizures, hypertension, chronic lymphedema of the lower extremity, hyperlipidemia who has had prior history of craniotomy for subarachnoid hemorrhage with aneurysmal clipping.   OT comments  Pt progressing slowly towards acute OT goals. Pt began to feel poorly once seated in the recliner a bit with PT. Today she needed mod A +2 to take pivotal steps from recliner to EOB. Spouse present throughout session. Updated d/c plan to SNF for rehab prior to returning home.    Follow Up Recommendations  SNF    Equipment Recommendations  Other (comment) (defert o next venue)    Recommendations for Other Services      Precautions / Restrictions Precautions Precautions: Fall Precaution Comments: difficulty moving/bending R knee Restrictions Weight Bearing Restrictions: No       Mobility Bed Mobility Overal bed mobility: Needs Assistance Bed Mobility: Sit to Supine     Supine to sit: Mod assist Sit to supine: Mod assist;+2 for physical assistance   General bed mobility comments: Requiring increased time, heavy use of rails, assist for trunk and legs.  Pt often moving in motions opposite of how therapist trying to assist and will pull - needs cues to make sure we are working together.   Of note pt does not sleep  in bed at home.    Transfers Overall transfer level: Needs assistance Equipment used: Rolling walker (2 wheeled) Transfers: Sit to/from [employer] Sit to Stand: Mod assist;From elevated surface Stand pivot transfers: Mod assist;+2 physical assistance       General transfer comment: Pt requiring mod A to stand from elevated bed.  Mod x 2 from recliner due to lower  surface and lightheadedness.  Requiring increased time and cues for safe hand placement.    Balance Overall balance assessment: Needs assistance Sitting-balance support: Feet supported;No upper extremity supported Sitting balance-Leahy Scale: Fair     Standing balance support: Bilateral upper extremity supported Standing balance-Leahy Scale: Poor Standing balance comment: needs RW for support                           ADL either performed or assessed with clinical judgement   ADL Overall ADL's : Needs assistance/impaired                         Toilet Transfer: Moderate assistance;+2 for physical assistance;Stand-pivot;RW;BSC;Requires wide/bariatric             General ADL Comments: Pt feeling poorly after sitting in recliner with PT for a short amount of time. Pt needed mod A +2 to take pivotal steps to return to bed. Extra time for initiation, some encouragement needed.     Vision       Perception     Praxis      Cognition Arousal/Alertness: Awake/alert Behavior During Therapy: WFL for tasks assessed/performed Overall Cognitive Status: Impaired/Different from baseline Area of Impairment: Following commands;Safety/judgement;Problem solving;Orientation                       Following Commands: Follows one step commands with increased time     Problem Solving: Slow processing;Requires verbal cues General Comments: Easily distracted -cues to  focus on task at hand. Frequently moving in opposite of cued direction, needed mulitmodal cues for directional movements.        Exercises     Shoulder Instructions       General Comments Pt seen for PT evaluation and was able to get up to chair. Once up in chair and feet elevated, pt reports nausea. Noted pt pale in color . Asked if she was also lightheaded and she said yes. BP was 112/59, noted BP prior to therapy was 132/52. OT then in for treatment and to assist. Transferred pt back to bed  and BP up to 121/48 and reports symptoms improving, pt's color also improving. Notified RN.  Pt was on RA with sats typically >90% during eval.  Did have episode 86-89% when back in bed but improved with deep breathin g- reports wears cpap at night for sleep apnea at home    Pertinent Vitals/ Pain       Pain Assessment: 0-10 Pain Score: 8  Pain Location: generalized Pain Descriptors / Indicators: Sore Pain Intervention(s): Monitored during session;Limited activity within patient's tolerance;Repositioned  Home Living Family/patient expects to be discharged to:: Private residence Living Arrangements: Spouse/significant other Available Help at Discharge: Family;Available 24 hours/day Type of Home: House Home Access: Level entry     Home Layout: Two level;1/2 bath on main level;Able to live on main level with bedroom/bathroom (Pt's bedroom is upstairs but she does not go up.  She sleeps in lift chair on first floor and does sponge baths) Alternate Level Stairs-Number of Steps: flight Alternate Level Stairs-Rails: Right;Left;Can reach both Bathroom Shower/Tub: Producer, television/film/video: Handicapped height Bathroom Accessibility: Yes   Home Equipment: Environmental consultant - 2 wheels;Cane - single point;Shower seat;Grab bars - tub/shower;Hand held shower head;Transport chair;Adaptive equipment;Bedside commode;Other (comment);Wheelchair - Recruitment consultant Equipment: Reacher;Long-handled shoe horn;Long-handled sponge Additional Comments: sleeps in recliner/lift chair, walks to half bath at night.      Prior Functioning/Environment Level of Independence: Needs assistance  Gait / Transfers Assistance Needed: Pt ambulate with RW at home; reports will push w/c if she needs to carry items but does not typically sit in w/c to use. Can ambulate short community distances ADL's / Homemaking Assistance Needed: Does sponge baths on her own; has assist with lower body dressing; does toileting on her own    Comments: .   Frequency  Min 2X/week        Progress Toward Goals  OT Goals(current goals can now be found in the care plan section)  Progress towards OT goals: Progressing toward goals (slow progress)  Acute Rehab OT Goals Patient Stated Goal: agreeable to SNF; decrease pain OT Goal Formulation: With patient Time For Goal Achievement: 01/23/21 Potential to Achieve Goals: Good  Plan Discharge plan needs to be updated    Co-evaluation    PT/OT/SLP Co-Evaluation/Treatment: Yes Reason for Co-Treatment: For patient/therapist safety;To address functional/ADL transfers   OT goals addressed during session: ADL's and self-care      AM-PAC OT "6 Clicks" Daily Activity     Outcome Measure   Help from another person eating meals?: None Help from another person taking care of personal grooming?: None Help from another person toileting, which includes using toliet, bedpan, or urinal?: A Lot Help from another person bathing (including washing, rinsing, drying)?: A Lot Help from another person to put on and taking off regular upper body clothing?: A Little Help from another person to put on and taking off regular lower body clothing?: A  Lot 6 Click Score: 17    End of Session Equipment Utilized During Treatment: Rolling walker  OT Visit Diagnosis: Unsteadiness on feet (R26.81);Muscle weakness (generalized) (M62.81);Other symptoms and signs involving cognitive function   Activity Tolerance Other (comment) (see general comments)   Patient Left in bed;with call bell/phone within reach;with family/visitor present;with bed alarm set   Nurse Communication Mobility status;Other (comment) (see general comments)        Time: 9030-0923 OT Time Calculation (min): 20 min  Charges: OT General Charges $OT Visit: 1 Visit OT Treatments $Self Care/Home Management : 8-22 mins  Raynald Kemp, OT Acute Rehabilitation Services Pager: [phone] Office:  [phone]    Pilar Grammes 01/10/2021, 1:47 PM

## 2021-01-10 NOTE — Evaluation (Signed)
Physical Therapy Evaluation Patient Details Name: [name] MRN: [id number] DOB: 01/04/1951 Today's Date: 01/10/2021   History of Present Illness  Pt is 70 y.o. female presenting withconfusion and admitted with sepsis secondary to R LE cellulitis on 01/08/21.  Pt with history of sleep apnea, seizures, hypertension, chronic lymphedema of the lower extremity, hyperlipidemia who has had prior history of craniotomy for subarachnoid hemorrhage with aneurysmal clipping.  Clinical Impression  Pt admitted with above diagnosis. At baseline, pt can ambulate short community distances with AD, she has some assist with ADLs, and she sleeps in a lift chair on the first floor.  Pt is well equipped with DME and does not have stairs that she has to use.  Today, pt limited by pain, nausea, and lightheadedness.  Requiring mod A for transfers and only able to ambulate to and from chair (3'x2) with assist.  Once in chair, pt reports nausea and lightheadedness - did have drop in bp and had to return to bed. Pt is well below her baseline and would benefit from SNF at d/c.  Pt currently with functional limitations due to the deficits listed below (see PT Problem List). Pt will benefit from skilled PT to increase their independence and safety with mobility to allow discharge to the venue listed below.       Follow Up Recommendations SNF    Equipment Recommendations  None recommended by PT    Recommendations for Other Services       Precautions / Restrictions Precautions Precautions: Fall Precaution Comments: difficulty moving/bending R knee      Mobility  Bed Mobility Overal bed mobility: Needs Assistance Bed Mobility: Supine to Sit;Sit to Supine     Supine to sit: Mod assist Sit to supine: Mod assist;+2 for physical assistance   General bed mobility comments: Requiring increased time, heavy use of rails, assist for trunk and legs.  Pt often moving in motions opposite of how therapist trying to assist  and will pull - needs cues to make sure we are working together.   Of note pt does not sleep  in bed at home.    Transfers Overall transfer level: Needs assistance Equipment used: Rolling walker (2 wheeled) Transfers: Sit to/from Stand Sit to Stand: Mod assist;From elevated surface Stand pivot transfers: Mod assist;+2 physical assistance       General transfer comment: Pt requiring mod A to stand from elevated bed.  Mod x 2 from recliner due to lower surface and lightheadedness.  Requiring increased time and cues for safe hand placement.  Ambulation/Gait Ambulation/Gait assistance: Min assist;+2 safety/equipment Gait Distance (Feet): 3 Feet (3'x2) Assistive device: Rolling walker (2 wheeled) Gait Pattern/deviations: Step-to pattern;Decreased stride length;Decreased weight shift to right;Shuffle Gait velocity: decreased   General Gait Details: Ambulated to chair and back to bed.  No further ambulation due to pain  Stairs            Wheelchair Mobility    Modified Rankin (Stroke Patients Only)       Balance Overall balance assessment: Needs assistance Sitting-balance support: Feet supported;No upper extremity supported Sitting balance-Leahy Scale: Fair     Standing balance support: Bilateral upper extremity supported Standing balance-Leahy Scale: Poor Standing balance comment: needs RW for support                             Pertinent Vitals/Pain Pain Assessment: 0-10 Pain Score:  (8/10 pre; 6/10 post) Pain Location: generalized Pain Descriptors /  Indicators: Sore Pain Intervention(s): Limited activity within patient's tolerance;Monitored during session    Home Living Family/patient expects to be discharged to:: Private residence Living Arrangements: Spouse/significant other Available Help at Discharge: Family;Available 24 hours/day Type of Home: House Home Access: Level entry     Home Layout: Two level;1/2 bath on main level;Able to live on  main level with bedroom/bathroom (Pt's bedroom is upstairs but she does not go up.  She sleeps in lift chair on first floor and does sponge baths) Home Equipment: Walker - 2 wheels;Cane - single point;Shower seat;Grab bars - tub/shower;Hand held shower head;Transport chair;Adaptive equipment;Bedside commode;Other (comment);Wheelchair - manual Additional Comments: sleeps in recliner/lift chair, walks to half bath at night.    Prior Function Level of Independence: Needs assistance   Gait / Transfers Assistance Needed: Pt ambulate with RW at home; reports will push w/c if she needs to carry items but does not typically sit in w/c to use. Can ambulate short community distances  ADL's / Homemaking Assistance Needed: Does sponge baths on her own; has assist with lower body dressing; does toileting on her own  Comments: .     Hand Dominance   Dominant Hand: Right    Extremity/Trunk Assessment   Upper Extremity Assessment Upper Extremity Assessment: Generalized weakness    Lower Extremity Assessment Lower Extremity Assessment: LLE deficits/detail;RLE deficits/detail RLE Deficits / Details: Very limited by pain and cellulitis, limited testing.  Keeps R knee extended due to pain.  SIgnificant edema, erythema, scaly skin.  MMT: <3/5 throughout LLE Deficits / Details: Also limited by pain but not as severe as R.  ROM grossly WFL , MMT at least 3/5 but not further tested    Cervical / Trunk Assessment Cervical / Trunk Assessment: Normal  Communication   Communication: No difficulties  Cognition Arousal/Alertness: Awake/alert Behavior During Therapy: WFL for tasks assessed/performed Overall Cognitive Status: Impaired/Different from baseline                                 General Comments: Easily distracted -cues to focus on task at hand      General Comments General comments (skin integrity, edema, etc.): Pt seen for PT evaluation and was able to get up to chair. Once up in  chair and feet elevated, pt reports nausea. Noted pt pale in color . Asked if she was also lightheaded and she said yes. BP was 112/59, noted BP prior to therapy was 132/52. OT then in for treatment and to assist. Transferred pt back to bed and BP up to 121/48 and reports symptoms improving, pt's color also improving. Notified RN.  Pt was on RA with sats typically >90% during eval.  Did have episode 86-89% when back in bed but improved with deep breathin g- reports wears cpap at night for sleep apnea at home    Exercises     Assessment/Plan    PT Assessment Patient needs continued PT services  PT Problem List Decreased strength;Decreased mobility;Decreased safety awareness;Decreased range of motion;Decreased activity tolerance;Cardiopulmonary status limiting activity;Decreased balance;Decreased knowledge of use of DME;Pain       PT Treatment Interventions DME instruction;Therapeutic activities;Gait training;Therapeutic exercise;Patient/family education;Balance training;Functional mobility training    PT Goals (Current goals can be found in the Care Plan section)  Acute Rehab PT Goals Patient Stated Goal: agreeable to SNF; decrease pain PT Goal Formulation: With patient/family Time For Goal Achievement: 01/24/21 Potential to Achieve Goals: Good  Frequency Min 2X/week   Barriers to discharge        Co-evaluation               AM-PAC PT "6 Clicks" Mobility  Outcome Measure Help needed turning from your back to your side while in a flat bed without using bedrails?: Total Help needed moving from lying on your back to sitting on the side of a flat bed without using bedrails?: Total Help needed moving to and from a bed to a chair (including a wheelchair)?: A Lot Help needed standing up from a chair using your arms (e.g., wheelchair or bedside chair)?: A Lot Help needed to walk in hospital room?: A Lot Help needed climbing 3-5 steps with a railing? : Total 6 Click Score: 9     End of Session Equipment Utilized During Treatment: Gait belt Activity Tolerance: Patient limited by pain;Patient limited by fatigue (nausea/lightheaded) Patient left: in bed;with call bell/phone within reach;with bed alarm set Nurse Communication: Mobility status PT Visit Diagnosis: Unsteadiness on feet (R26.81);Muscle weakness (generalized) (M62.81);Pain Pain - Right/Left: Right Pain - part of body: Ankle and joints of foot    Time: 4315-4008 (OT in for last 20 mins; 55 mins with PT) PT Time Calculation (min) (ACUTE ONLY): 75 min   Charges:   PT Evaluation $PT Eval Moderate Complexity: 1 Mod PT Treatments $Gait Training: 8-22 mins $Therapeutic Activity: 23-37 mins        Anise Salvo, PT Acute Rehab Services Pager [phone] [facility] [phone]   Rayetta Humphrey 01/10/2021, 12:04 PM

## 2021-01-10 NOTE — [place] FL2 (Signed)
[place] MEDICAID FL2 LEVEL OF CARE SCREENING TOOL     IDENTIFICATION  Patient Name: [name] Birthdate: [birth year] Sex: female Admission Date (Current Location): 01/08/2021  [facility] and [place] Number:  Producer, television/film/video and Address:  [facility], [address] [place], [place] [address]      Provider Number: [id number]  Attending Physician Name and Address:  Maretta Bees, MD  Relative Name and Phone Number:       Current Level of Care: Hospital Recommended Level of Care: Skilled Nursing Facility Prior Approval Number:    Date Approved/Denied:   PASRR Number: [id number]  Discharge Plan: SNF    Current Diagnoses: Patient Active Problem List   Diagnosis Date Noted   SIRS (systemic inflammatory response syndrome) (HCC) 01/09/2021   Acute encephalopathy 01/09/2021   Hyponatremia 12/20/2018   Cellulitis 12/20/2018   Cellulitis of left lower extremity 12/19/2018   Hypokalemia 02/17/2017   DOE (dyspnea on exertion) 01/08/2017   Medication management 01/08/2017   Weight gain 01/08/2017   Lymphedema of right lower extremity 10/22/2016   Wound dehiscence, surgical, subsequent encounter 05/12/2016   S/P ankle arthrodesis 03/12/2016   OSA on CPAP 09/10/2015   Nocturia more than twice per night 09/10/2015   Morbid obesity due to excess calories (HCC) 09/10/2015   Loss of consciousness (HCC) 08/15/2015   Fracture of ankle, trimalleolar, closed 07/03/2015   Syncope 07/02/2015   Seizure disorder (HCC) 07/02/2015   Essential hypertension 07/02/2015   Bilateral lower extremity edema 07/02/2015   Closed right ankle fracture 07/02/2015   GERD (gastroesophageal reflux disease) 07/02/2015   HLD (hyperlipidemia) 07/02/2015   Anosmia/chronic 07/02/2015   Leukocytosis 07/02/2015   Restless legs syndrome (RLS) 04/28/2013   Sleep apnea with use of continuous positive airway pressure (CPAP) 01/24/2013    Orientation RESPIRATION BLADDER  Height & Weight     Self, Time, Situation, Place  O2, Other (Comment) (2L nasal cannula, has home cpap machine) Incontinent, External catheter Weight: 300 lb (136.1 kg) Height:  5\' 2"  (157.5 cm)  BEHAVIORAL SYMPTOMS/MOOD NEUROLOGICAL BOWEL NUTRITION STATUS      Continent Diet (see dc summary)  AMBULATORY STATUS COMMUNICATION OF NEEDS Skin   Limited Assist Verbally PU Stage and Appropriate Care (stage II on buttocks)                       Personal Care Assistance Level of Assistance  Bathing, Feeding, Dressing Bathing Assistance: Maximum assistance Feeding assistance: Limited assistance Dressing Assistance: Limited assistance     Functional Limitations Info             SPECIAL CARE FACTORS FREQUENCY  PT (By licensed PT), OT (By licensed OT)     PT Frequency: 5x/week OT Frequency: 5x/week            Contractures Contractures Info: Not present    Additional Factors Info  Code Status, Allergies Code Status Info: Full Allergies Info: Compazine, Prochlorperazine Maleate, Topamax (Topiramate), Codeine Sulfate, Seasonal Ic (Cholestatin), Adhesive (Tape), Iodinated Diagnostic Agents           Current Medications (01/10/2021):  This is the current hospital active medication list Current Facility-Administered Medications  Medication Dose Route Frequency Provider Last Rate Last Admin   amLODipine (NORVASC) tablet 5 mg  5 mg Oral Daily 03/12/2021, MD   5 mg at 01/10/21 03/12/21   aspirin EC tablet 81 mg  81 mg Oral BID 1829, MD  81 mg at 01/10/21 0827   doxycycline (VIBRAMYCIN) 50 MG capsule 50 mg  50 mg Oral Q24H Maretta Bees, MD   50 mg at 01/10/21 1152   enoxaparin (LOVENOX) injection 60 mg  60 mg Subcutaneous Q24H Eduard Clos, MD   60 mg at 01/10/21 1313   ferrous sulfate tablet 650 mg  650 mg Oral Q breakfast Eduard Clos, MD   650 mg at 01/10/21 0825   gabapentin (NEURONTIN) capsule 200 mg  200 mg Oral TID Maretta Bees, MD   200 mg at 01/10/21 0825   hydrOXYzine (ATARAX/VISTARIL) tablet 25 mg  25 mg Oral Daily Eduard Clos, MD   25 mg at 01/10/21 0826   irbesartan (AVAPRO) tablet 75 mg  75 mg Oral BID Eduard Clos, MD   75 mg at 01/10/21 0827   magnesium oxide (MAG-OX) tablet 1,200 mg  1,200 mg Oral QHS Eduard Clos, MD   1,200 mg at 01/09/21 2111   magnesium sulfate IVPB 2 g 50 mL  2 g Intravenous Once Maretta Bees, MD       metolazone (ZAROXOLYN) tablet 5 mg  5 mg Oral Daily Eduard Clos, MD   5 mg at 01/10/21 0826   montelukast (SINGULAIR) tablet 10 mg  10 mg Oral QHS Eduard Clos, MD   10 mg at 01/09/21 2112   ondansetron (ZOFRAN) injection 4 mg  4 mg Intravenous Q6H PRN Maretta Bees, MD   4 mg at 01/10/21 1151   pantoprazole (PROTONIX) EC tablet 40 mg  40 mg Oral QHS Eduard Clos, MD   40 mg at 01/09/21 2112   PHENobarbital (LUMINAL) tablet 129.6 mg  129.6 mg Oral QHS Eduard Clos, MD   129.6 mg at 01/09/21 2112   potassium chloride SA (KLOR-CON) CR tablet 80 mEq  80 mEq Oral Daily Eduard Clos, MD   80 mEq at 01/10/21 9562   pramipexole (MIRAPEX) tablet 0.25 mg  0.25 mg Oral Q6H Eduard Clos, MD   0.25 mg at 01/10/21 1152   rosuvastatin (CRESTOR) tablet 20 mg  20 mg Oral Daily Eduard Clos, MD   20 mg at 01/10/21 0827   spironolactone (ALDACTONE) tablet 50 mg  50 mg Oral Daily Eduard Clos, MD   50 mg at 01/10/21 0826   torsemide (DEMADEX) tablet 20 mg  20 mg Oral Daily Eduard Clos, MD   20 mg at 01/10/21 0826   vancomycin (VANCOREADY) IVPB 1500 mg/300 mL  1,500 mg Intravenous Q24H Juliette Mangle, RPH   Stopped at 01/10/21 0715   zinc sulfate capsule 220 mg  220 mg Oral Daily Maretta Bees, MD   220 mg at 01/10/21 0825     Discharge Medications: Please see discharge summary for a list of discharge medications.  Relevant Imaging Results:  Relevant Lab Results:   Additional  Information SS#: [id number]. [employer] 07/19/19, 08/09/19  Mearl Latin, LCSW

## 2021-01-11 DIAGNOSIS — I890 Lymphedema, not elsewhere classified: Secondary | ICD-10-CM

## 2021-01-11 LAB — BASIC METABOLIC PANEL
Anion gap: 9 (ref 5–15)
BUN: 20 mg/dL (ref 8–23)
CO2: 28 mmol/L (ref 22–32)
Calcium: 8.9 mg/dL (ref 8.9–10.3)
Chloride: 96 mmol/L — ABNORMAL LOW (ref 98–111)
Creatinine, Ser: 1.24 mg/dL — ABNORMAL HIGH (ref 0.44–1.00)
GFR, Estimated: 47 mL/min — ABNORMAL LOW (ref >60)
Glucose, Bld: 115 mg/dL — ABNORMAL HIGH (ref 70–99)
Potassium: 3.6 mmol/L (ref 3.5–5.1)
Sodium: 133 mmol/L — ABNORMAL LOW (ref 135–145)

## 2021-01-11 LAB — CBC
HCT: 31 % — ABNORMAL LOW (ref 36.0–46.0)
Hemoglobin: 10.5 g/dL — ABNORMAL LOW (ref 12.0–15.0)
MCH: 32.5 pg (ref 26.0–34.0)
MCHC: 33.9 g/dL (ref 30.0–36.0)
MCV: 96 fL (ref 80.0–100.0)
Platelets: 246 10*3/uL (ref 150–400)
RBC: 3.23 MIL/uL — ABNORMAL LOW (ref 3.87–5.11)
RDW: 13.7 % (ref 11.5–15.5)
WBC: 9.1 10*3/uL (ref 4.0–10.5)
nRBC: 0 % (ref 0.0–0.2)

## 2021-01-11 LAB — MAGNESIUM: Magnesium: 2 mg/dL (ref 1.7–2.4)

## 2021-01-11 MED ORDER — TRAMADOL HCL 50 MG PO TABS
50.0000 mg | ORAL_TABLET | Freq: Three times a day (TID) | ORAL | Status: DC | PRN
Start: 2021-01-11 — End: 2021-01-14
  Administered 2021-01-11 – 2021-01-14 (×5): 50 mg via ORAL
  Filled 2021-01-11 (×5): qty 1

## 2021-01-11 NOTE — TOC Progression Note (Signed)
Transition of Care [facility]) - Progression Note    Patient Details  Name: [name] MRN: [id number] Date of Birth: 07-19-1950  Transition of Care [facility]) CM/SW Contact  Levada Schilling Phone Number: 01/11/2021, 9:58 AM  Clinical Narrative:     Pt has no bed offers.  TOC will continue to assist with disposition planning.  Expected Discharge Plan: Skilled Nursing Facility Barriers to Discharge: Continued Medical Work up, SNF Pending bed offer, English as a second language teacher  Expected Discharge Plan and Services Expected Discharge Plan: Skilled Nursing Facility In-house Referral: Clinical Social Work   Post Acute Care Choice: Skilled Nursing Facility Living arrangements for the past 2 months: Single Family Home                                       Social Determinants of Health (SDOH) Interventions    Readmission Risk Interventions No flowsheet data found.

## 2021-01-11 NOTE — Progress Notes (Addendum)
PROGRESS NOTE        PATIENT DETAILS Name: [name] Age: 70 y.o. Sex: female Date of Birth: [birth year] Admit Date: 01/08/2021 Admitting Physician Eduard Clos, MD RSW:NIOEVOJ, Jasmine December, MD  Brief Narrative: Patient is a 70 y.o. female with history of chronic bilateral lower extremity lymphedema, seizures, OSA (noncompliant to CPAP) HLD, SAH-s/p craniotomy and aneurysm clipping-presented with confusion-and sepsis physiology secondary to RLE cellulitis.  Significant events: 8/31>> fever-confusion-RLE cellulitis-admit to [facility].  Significant studies: 8/31>> CXR: No pneumonia. 8/31>> Doppler bilateral lower extremity: No DVT 8/31>> CT head: No acute intracranial abnormality. 9/01>> CT angio head/neck: No acute abnormality-no change from CTA done in 2014 (fusiform aneurysm of the supraclinoid ICA on right-s/p stenting, aneurysmal disease dilatation distal to the stent, aneurysmal clip at the right MCA bifurcation, right PCA origin). 9/01>> x-ray right tibia/fibula: No acute finding. 9/01>> x-ray right foot: No acute finding. 9/01>> EEG: No seizures  Antimicrobial therapy: Vancomycin: 8/31>> Cefepime: 8/31>>9/1  Microbiology data: 8/31>> influenza/COVID PCR: 8/31>> blood culture: no growth 9/01>> urine culture: no growth  Procedures : None  Consults: Neurology  DVT Prophylaxis : Prophylactic Lovenox  Subjective: Much more fluent and answering questions-she appears to be significantly better than the past few days.   Assessment/Plan: Sepsis due to RLE cellulitis: Sepsis physiology has resolved-improvement in RLE erythema (has lymphedema at baseline).  Continue IV vancomycin-cultures negative so far.  Acute metabolic encephalopathy: Likely due to sepsis-has improved-suspect patient not far from baseline.  Neuroimaging was negative for any abnormalities-EEG without seizures.  Neurology following with recommendations to continue supportive  care.  Seizure disorder: EEG negative-continue phenobarbital.  HTN: BP stable-continue ARB/amlodipine  HLD: Continue statin  HFpEF/bilateral lower extremity lymphedema: Volume status stable-unchanged-continue Aldactone/metolazone/Demadex.  Per patient she is noncompliant with leg compression therapy.  History of rosacea: On doxycycline  OSA: CPAP at night-but has been noncompliant lately  Morbid Obesity: Estimated body mass index is 54.87 kg/m as calculated from the following:   Height as of this encounter: 5\' 2"  (1.575 m).   Weight as of this encounter: 136.1 kg.     Diet: Diet Order             Diet Heart Room service appropriate? Yes with Assist; Fluid consistency: Thin; Fluid restriction: 1200 mL Fluid  Diet effective now                    Code Status: Full code   Family Communication: None at bedside this morning.  Disposition Plan: Status is: Inpatient  Remains inpatient appropriate because:Inpatient level of care appropriate due to severity of illness  Dispo: The patient is from: Home              Anticipated d/c is to: Home              Patient currently is not medically stable to d/c.   Difficult to place patient No    Barriers to Discharge: Continue IV antibiotics for cellulitis-needs SNF on discharge.  Antimicrobial agents: Anti-infectives (From admission, onward)    Start     Dose/Rate Route Frequency Ordered Stop   01/10/21 0000  vancomycin (VANCOREADY) IVPB 1500 mg/300 mL        1,500 mg 150 mL/hr over 120 Minutes Intravenous Every 24 hours 01/09/21 0514     01/09/21 1200  doxycycline (VIBRAMYCIN) 50 MG capsule 50  mg        50 mg Oral Every 24 hours 01/09/21 0905     01/09/21 1000  doxycycline (VIBRAMYCIN) 50 MG capsule 50 mg  Status:  Discontinued        50 mg Oral Daily 01/09/21 0904 01/09/21 0905   01/09/21 0600  ceFEPIme (MAXIPIME) 2 g in sodium chloride 0.9 % 100 mL IVPB  Status:  Discontinued        2 g 200 mL/hr over 30 Minutes  Intravenous Every 8 hours 01/09/21 0514 01/10/21 1040   01/09/21 0445  vancomycin (VANCOCIN) IVPB 1000 mg/200 mL premix  Status:  Discontinued        1,000 mg 200 mL/hr over 60 Minutes Intravenous  Once 01/09/21 0444 01/09/21 0448   01/09/21 0445  ceFEPIme (MAXIPIME) 2 g in sodium chloride 0.9 % 100 mL IVPB  Status:  Discontinued        2 g 200 mL/hr over 30 Minutes Intravenous  Once 01/09/21 0444 01/09/21 0514   01/09/21 0115  vancomycin (VANCOREADY) IVPB 2000 mg/400 mL        2,000 mg 200 mL/hr over 120 Minutes Intravenous  Once 01/09/21 0059 01/09/21 0423   01/09/21 0100  piperacillin-tazobactam (ZOSYN) IVPB 3.375 g        3.375 g 100 mL/hr over 30 Minutes Intravenous  Once 01/09/21 0059 01/09/21 0211        Time spent: 25 minutes-Greater than 50% of this time was spent in counseling, explanation of diagnosis, planning of further management, and coordination of care.  MEDICATIONS: Scheduled Meds:  amLODipine  5 mg Oral Daily   aspirin EC  81 mg Oral BID   doxycycline  50 mg Oral Q24H   enoxaparin (LOVENOX) injection  60 mg Subcutaneous Q24H   ferrous sulfate  650 mg Oral Q breakfast   gabapentin  200 mg Oral TID   hydrOXYzine  25 mg Oral Daily   irbesartan  75 mg Oral BID   magnesium oxide  1,200 mg Oral QHS   metolazone  5 mg Oral Daily   montelukast  10 mg Oral QHS   pantoprazole  40 mg Oral QHS   PHENobarbital  129.6 mg Oral QHS   potassium chloride SA  80 mEq Oral Daily   pramipexole  0.25 mg Oral Q6H   rosuvastatin  20 mg Oral Daily   spironolactone  50 mg Oral Daily   torsemide  20 mg Oral Daily   zinc sulfate  220 mg Oral Daily   Continuous Infusions:  vancomycin 1,500 mg (01/11/21 0109)   PRN Meds:.   PHYSICAL EXAM: Vital signs: Vitals:   01/10/21 2347 01/11/21 0308 01/11/21 0756 01/11/21 1211  BP: (!) 110/52 (!) 113/46 (!) 104/54 123/62  Pulse: 76 65 73 83  Resp: 17 (!) 21 17 18   Temp: 100 F (37.8 C) 99.8 F (37.7 C) 98.7 F (37.1 C) 98.9 F  (37.2 C)  TempSrc: Axillary Oral Oral Oral  SpO2: 96% 91% 91% 95%  Weight:      Height:       Filed Weights   01/08/21 1838  Weight: 136.1 kg   Body mass index is 54.87 kg/m.   Gen Exam:Alert awake-not in any distress HEENT:atraumatic, normocephalic Chest: B/L clear to auscultation anteriorly CVS:S1S2 regular Abdomen:soft non tender, non distended Extremities: Some erythema in the right leg persists.  Patient has lymphedema (right> left) Neurology: Non focal Skin: no rash   I have personally reviewed following labs and imaging studies  LABORATORY DATA: CBC: Recent Labs  Lab 01/08/21 2148 01/09/21 0747 01/10/21 1118 01/11/21 0104  WBC 10.7* 9.7 12.0* 9.1  NEUTROABS 8.4*  --   --   --   HGB 11.3* 9.7* 11.6* 10.5*  HCT 32.7* 29.1* 33.7* 31.0*  MCV 93.7 95.7 94.4 96.0  PLT 197 168 291 246     Basic Metabolic Panel: Recent Labs  Lab 01/08/21 2148 01/09/21 0747 01/10/21 1118 01/11/21 0104  NA 135 133* 134* 133*  K 3.4* 3.4* 3.8 3.6  CL 99 103 99 96*  CO2 24 20* 24 28  GLUCOSE 99 135* 97 115*  BUN CREATININE 0.93 0.98 1.04* 1.24*  CALCIUM 9.6 8.6* 9.4 8.9  MG  --   --  1.7 2.0     GFR: Estimated Creatinine Clearance: 57.1 mL/min (A) (by C-G formula based on SCr of 1.24 mg/dL (H)).  Liver Function Tests: Recent Labs  Lab 01/08/21 2148  AST 24  ALT 16  ALKPHOS 83  BILITOT 1.4*  PROT 7.5  ALBUMIN 3.5    Recent Labs  Lab 01/08/21 2148  LIPASE <10*    No results for input(s): AMMONIA in the last 168 hours.  Coagulation Profile: Recent Labs  Lab 01/08/21 2253  INR 1.2     Cardiac Enzymes: No results for input(s): CKTOTAL, CKMB, CKMBINDEX, TROPONINI in the last 168 hours.  BNP (last 3 results) No results for input(s): PROBNP in the last 8760 hours.  Lipid Profile: No results for input(s): CHOL, HDL, LDLCALC, TRIG, CHOLHDL, LDLDIRECT in the last 72 hours.  Thyroid Function Tests: Recent Labs    01/08/21 2343  TSH  1.641     Anemia Panel: No results for input(s): VITAMINB12, FOLATE, FERRITIN, TIBC, IRON, RETICCTPCT in the last 72 hours.  Urine analysis:    Component Value Date/Time   COLORURINE AMBER (A) 01/09/2021 0048   APPEARANCEUR CLEAR 01/09/2021 0048   LABSPEC 1.016 01/09/2021 0048   PHURINE 6.0 01/09/2021 0048   GLUCOSEU NEGATIVE 01/09/2021 0048   HGBUR MODERATE (A) 01/09/2021 0048   BILIRUBINUR NEGATIVE 01/09/2021 0048   KETONESUR 20 (A) 01/09/2021 0048   PROTEINUR >=300 (A) 01/09/2021 0048   NITRITE NEGATIVE 01/09/2021 0048   LEUKOCYTESUR NEGATIVE 01/09/2021 0048    Sepsis Labs: Lactic Acid, Venous    Component Value Date/Time   LATICACIDVEN 1.2 01/08/2021 2253    MICROBIOLOGY: Recent Results (from the past 240 hour(s))  Blood culture (routine x 2)     Status: None (Preliminary result)   Collection Time: 01/08/21 11:29 PM   Specimen: BLOOD  Result Value Ref Range Status   Specimen Description   Final    BLOOD BOTTLES DRAWN AEROBIC ONLY Performed at [facility], [address] [place], [place] [address]    Special Requests   Final    Blood Culture results may not be optimal due to an inadequate volume of blood received in culture bottles BLOOD RIGHT HAND Performed at [facility], [address] [place], [place] [address]    Culture   Final    NO GROWTH 2 DAYS Performed at [facility], [address], [place], [place] [address]    Report Status PENDING  Incomplete  Blood culture (routine x 2)     Status: None (Preliminary result)   Collection Time: 01/08/21 11:42 PM   Specimen: BLOOD  Result Value Ref Range Status   Specimen Description   Final    BLOOD BOTTLES DRAWN AEROBIC  AND ANAEROBIC Performed at [facility], [address], [place], [place] [address]    Special Requests   Final    Blood Culture adequate volume BLOOD LEFT ARM Performed at [facility], [address], [place], [place] [address]    Culture   Final    NO GROWTH 2 DAYS Performed at [facility], [address] [place], [place] [address]    Report Status PENDING  Incomplete  Resp Panel by RT-PCR (Flu A&B, Covid) Peripheral     Status: None   Collection Time: 01/08/21 11:43 PM   Specimen: Peripheral; Nasopharyngeal(NP) swabs in vial transport medium  Result Value Ref Range Status   SARS Coronavirus 2 by RT PCR NEGATIVE NEGATIVE Final    Comment: (NOTE) SARS-CoV-2 target nucleic acids are NOT DETECTED.  The SARS-CoV-2 RNA is generally detectable in upper respiratory specimens during the acute phase of infection. The lowest concentration of SARS-CoV-2 viral copies this assay can detect is 138 copies/mL. A negative result does not preclude SARS-Cov-2 infection and should not be used as the sole basis for treatment or other patient management decisions. A negative result may occur with  improper specimen collection/handling, submission of specimen other than nasopharyngeal swab, presence of viral mutation(s) within the areas targeted by this assay, and inadequate number of viral copies(<138 copies/mL). A negative result must be combined with clinical observations, patient history, and epidemiological information. The expected result is Negative.  Fact Sheet for Patients:  BloggerCourse.comhttps://www.fda.gov/media/152166/download  Fact Sheet for Healthcare Providers:  SeriousBroker.ithttps://www.fda.gov/media/152162/download  This test is no t yet approved or cleared by the [place] FDA and  has been authorized for detection and/or diagnosis of SARS-CoV-2 by FDA under an Emergency Use Authorization (EUA). This EUA will remain  in effect (meaning this test can be used) for the duration of the COVID-19 declaration under Section 564(b)(1) of the Act, 21 U.S.C.section 360bbb-3(b)(1), unless the authorization is terminated  or revoked sooner.       Influenza A by PCR NEGATIVE NEGATIVE Final    Influenza B by PCR NEGATIVE NEGATIVE Final    Comment: (NOTE) The Xpert Xpress SARS-CoV-2/FLU/RSV plus assay is intended as an aid in the diagnosis of influenza from Nasopharyngeal swab specimens and should not be used as a sole basis for treatment. Nasal washings and aspirates are unacceptable for Xpert Xpress SARS-CoV-2/FLU/RSV testing.  Fact Sheet for Patients: BloggerCourse.comhttps://www.fda.gov/media/152166/download  Fact Sheet for Healthcare Providers: SeriousBroker.ithttps://www.fda.gov/media/152162/download  This test is not yet approved or cleared by the [place] FDA and has been authorized for detection and/or diagnosis of SARS-CoV-2 by FDA under an Emergency Use Authorization (EUA). This EUA will remain in effect (meaning this test can be used) for the duration of the COVID-19 declaration under Section 564(b)(1) of the Act, 21 U.S.C. section 360bbb-3(b)(1), unless the authorization is terminated or revoked.  Performed at [facility], [address], [place], [place] [address]   Urine Culture     Status: Abnormal   Collection Time: 01/09/21 12:48 AM   Specimen: Urine, Clean Catch  Result Value Ref Range Status   Specimen Description URINE, CLEAN CATCH  Final   Special Requests   Final    NONE Performed at [facility], [address] [place], [place] [address]    Culture MULTIPLE SPECIES PRESENT, SUGGEST RECOLLECTION (A)  Final   Report Status 01/10/2021 FINAL  Final  MRSA Next Gen by PCR, Nasal     Status: None   Collection Time:  01/09/21  1:11 PM   Specimen: Nasal Mucosa; Nasal Swab  Result Value Ref Range Status   MRSA by PCR Next Gen NOT DETECTED NOT DETECTED Final    Comment: (NOTE) The GeneXpert MRSA Assay (FDA approved for NASAL specimens only), is one component of a comprehensive MRSA colonization surveillance program. It is not intended to diagnose MRSA infection nor to guide or monitor treatment for MRSA infections. Test performance is not FDA  approved in patients less than 45 years old. Performed at [facility], [address], [place], [place] [address]     RADIOLOGY STUDIES/RESULTS: No results found.   LOS: 2 days   Jeoffrey Massed, MD  [facility]    To contact the attending provider between 7A-7P or the covering provider during after hours 7P-7A, please log into the web site [email or web address] and access using universal [place] password for that web site. If you do not have the password, please call the hospital operator.  01/11/2021, 2:00 PM

## 2021-01-12 LAB — BASIC METABOLIC PANEL
Anion gap: 10 (ref 5–15)
BUN: 23 mg/dL (ref 8–23)
CO2: 31 mmol/L (ref 22–32)
Calcium: 9.2 mg/dL (ref 8.9–10.3)
Chloride: 92 mmol/L — ABNORMAL LOW (ref 98–111)
Creatinine, Ser: 1.2 mg/dL — ABNORMAL HIGH (ref 0.44–1.00)
GFR, Estimated: 49 mL/min — ABNORMAL LOW (ref >60)
Glucose, Bld: 110 mg/dL — ABNORMAL HIGH (ref 70–99)
Potassium: 4.4 mmol/L (ref 3.5–5.1)
Sodium: 133 mmol/L — ABNORMAL LOW (ref 135–145)

## 2021-01-12 MED ORDER — DOXYCYCLINE HYCLATE 50 MG PO CAPS
50.0000 mg | ORAL_CAPSULE | ORAL | Status: DC
  Administered 2021-01-15: 50 mg via ORAL
  Filled 2021-01-12: qty 1

## 2021-01-12 MED ORDER — DOXYCYCLINE HYCLATE 100 MG PO TABS
100.0000 mg | ORAL_TABLET | Freq: Two times a day (BID) | ORAL | Status: AC
Start: 2021-01-12 — End: 2021-01-15
  Administered 2021-01-12 – 2021-01-14 (×5): 100 mg via ORAL
  Filled 2021-01-12 (×5): qty 1

## 2021-01-12 NOTE — Progress Notes (Signed)
PROGRESS NOTE        PATIENT DETAILS Name: [name] Age: 70 y.o. Sex: female Date of Birth: 1950-07-26 Admit Date: 01/08/2021 Admitting Physician Eduard Clos, MD YHC:[id number], Jasmine December, MD  Brief Narrative: Patient is a 70 y.o. female with history of chronic bilateral lower extremity lymphedema, seizures, OSA (noncompliant to CPAP) HLD, SAH-s/p craniotomy and aneurysm clipping-presented with confusion-and sepsis physiology secondary to RLE cellulitis.  Significant events: 8/31>> fever-confusion-RLE cellulitis-admit to [facility].  Significant studies: 8/31>> CXR: No pneumonia. 8/31>> Doppler bilateral lower extremity: No DVT 8/31>> CT head: No acute intracranial abnormality. 9/01>> CT angio head/neck: No acute abnormality-no change from CTA done in 2014 (fusiform aneurysm of the supraclinoid ICA on right-s/p stenting, aneurysmal disease dilatation distal to the stent, aneurysmal clip at the right MCA bifurcation, right PCA origin). 9/01>> x-ray right tibia/fibula: No acute finding. 9/01>> x-ray right foot: No acute finding. 9/01>> EEG: No seizures  Antimicrobial therapy: Vancomycin: 8/31>>9/4 Cefepime: 8/31>>9/1 Doxy: 9/4>>  Microbiology data: 8/31>> influenza/COVID PCR: 8/31>> blood culture: no growth 9/01>> urine culture: no growth  Procedures : None  Consults: Neurology  DVT Prophylaxis : Prophylactic Lovenox  Subjective: Had just woken up when I walked in-sleepy but answering most of my questions appropriately.  She is now agreeable to wrap her lower extremities.   Assessment/Plan: Sepsis due to RLE cellulitis: Sepsis physiology has resolved-improvement in RLE erythema (has lymphedema at baseline).  We will stop IV vancomycin today-and transition to oral doxycycline.  Acute metabolic encephalopathy: Likely due to sepsis-has improved-suspect patient not far from baseline.  Neuroimaging was negative for any abnormalities-EEG without  seizures.  Neurology following with recommendations to continue supportive care.  Seizure disorder: EEG negative-continue phenobarbital.  HTN: BP stable-continue ARB/amlodipine  HLD: Continue statin  HFpEF/bilateral lower extremity lymphedema: Volume status stable-unchanged-continue Aldactone/metolazone/Demadex.  She has been noncompliant to compression therapy in the past-however she is agreeable today-we will consult wound care for Unna boots.  History of rosacea: On doxycycline  OSA: CPAP at night-but has been noncompliant lately  Morbid Obesity: Estimated body mass index is 54.87 kg/m as calculated from the following:   Height as of this encounter: 5\' 2"  (1.575 m).   Weight as of this encounter: 136.1 kg.     Diet: Diet Order             Diet Heart Room service appropriate? Yes with Assist; Fluid consistency: Thin; Fluid restriction: 1200 mL Fluid  Diet effective now                    Code Status: Full code   Family Communication: None at bedside this morning.  Disposition Plan: Status is: Inpatient  Remains inpatient appropriate because:Inpatient level of care appropriate due to severity of illness  Dispo: The patient is from: Home              Anticipated d/c is to: Home              Patient currently is  medically stable to d/c.   Difficult to place patient No    Barriers to Discharge: Awaiting SNF bed.  Antimicrobial agents: Anti-infectives (From admission, onward)    Start     Dose/Rate Route Frequency Ordered Stop   01/10/21 0000  vancomycin (VANCOREADY) IVPB 1500 mg/300 mL        1,500 mg 150 mL/hr  over 120 Minutes Intravenous Every 24 hours 01/09/21 0514     01/09/21 1200  doxycycline (VIBRAMYCIN) 50 MG capsule 50 mg        50 mg Oral Every 24 hours 01/09/21 0905     01/09/21 1000  doxycycline (VIBRAMYCIN) 50 MG capsule 50 mg  Status:  Discontinued        50 mg Oral Daily 01/09/21 0904 01/09/21 0905   01/09/21 0600  ceFEPIme (MAXIPIME) 2  g in sodium chloride 0.9 % 100 mL IVPB  Status:  Discontinued        2 g 200 mL/hr over 30 Minutes Intravenous Every 8 hours 01/09/21 0514 01/10/21 1040   01/09/21 0445  vancomycin (VANCOCIN) IVPB 1000 mg/200 mL premix  Status:  Discontinued        1,000 mg 200 mL/hr over 60 Minutes Intravenous  Once 01/09/21 0444 01/09/21 0448   01/09/21 0445  ceFEPIme (MAXIPIME) 2 g in sodium chloride 0.9 % 100 mL IVPB  Status:  Discontinued        2 g 200 mL/hr over 30 Minutes Intravenous  Once 01/09/21 0444 01/09/21 0514   01/09/21 0115  vancomycin (VANCOREADY) IVPB 2000 mg/400 mL        2,000 mg 200 mL/hr over 120 Minutes Intravenous  Once 01/09/21 0059 01/09/21 0423   01/09/21 0100  piperacillin-tazobactam (ZOSYN) IVPB 3.375 g        3.375 g 100 mL/hr over 30 Minutes Intravenous  Once 01/09/21 0059 01/09/21 0211        Time spent: 25 minutes-Greater than 50% of this time was spent in counseling, explanation of diagnosis, planning of further management, and coordination of care.  MEDICATIONS: Scheduled Meds:  amLODipine  5 mg Oral Daily   aspirin EC  81 mg Oral BID   doxycycline  50 mg Oral Q24H   enoxaparin (LOVENOX) injection  60 mg Subcutaneous Q24H   ferrous sulfate  650 mg Oral Q breakfast   gabapentin  200 mg Oral TID   hydrOXYzine  25 mg Oral Daily   irbesartan  75 mg Oral BID   magnesium oxide  1,200 mg Oral QHS   metolazone  5 mg Oral Daily   montelukast  10 mg Oral QHS   pantoprazole  40 mg Oral QHS   PHENobarbital  129.6 mg Oral QHS   potassium chloride SA  80 mEq Oral Daily   pramipexole  0.25 mg Oral Q6H   rosuvastatin  20 mg Oral Daily   spironolactone  50 mg Oral Daily   torsemide  20 mg Oral Daily   zinc sulfate  220 mg Oral Daily   Continuous Infusions:  vancomycin Stopped (01/12/21 0451)   PRN Meds:.   PHYSICAL EXAM: Vital signs: Vitals:   01/12/21 0307 01/12/21 0400 01/12/21 0727 01/12/21 1158  BP: (!) 137/57 (!) 104/55 (!) 113/51 (!) 108/55  Pulse: 79  73 72 80  Resp: 20 19 20 14   Temp: 98.8 F (37.1 C)  98.7 F (37.1 C) 98.6 F (37 C)  TempSrc: Oral  Oral Oral  SpO2: 96% 95% 95% 95%  Weight:      Height:       Filed Weights   01/08/21 1838  Weight: 136.1 kg   Body mass index is 54.87 kg/m.   Gen Exam:Alert awake-not in any distress HEENT:atraumatic, normocephalic Chest: B/L clear to auscultation anteriorly CVS:S1S2 regular Abdomen:soft non tender, non distended Extremities: Mild superimposed edema on RLE-has chronic skin thickening/lymphedema at baseline.  Neurology: Non focal Skin: no rash   I have personally reviewed following labs and imaging studies  LABORATORY DATA: CBC: Recent Labs  Lab 01/08/21 2148 01/09/21 0747 01/10/21 1118 01/11/21 0104  WBC 10.7* 9.7 12.0* 9.1  NEUTROABS 8.4*  --   --   --   HGB 11.3* 9.7* 11.6* 10.5*  HCT 32.7* 29.1* 33.7* 31.0*  MCV 93.7 95.7 94.4 96.0  PLT 197 168 291 246     Basic Metabolic Panel: Recent Labs  Lab 01/08/21 2148 01/09/21 0747 01/10/21 1118 01/11/21 0104 01/12/21 0015  NA 135 133* 134* 133* 133*  K 3.4* 3.4* 3.8 3.6 4.4  CL 99 103 99 96* 92*  CO2 24 20* 24 28 31   GLUCOSE 99 135* 97 115* 110*  BUN 13 13 17 20 23   CREATININE 0.93 0.98 1.04* 1.24* 1.20*  CALCIUM 9.6 8.6* 9.4 8.9 9.2  MG  --   --  1.7 2.0  --      GFR: Estimated Creatinine Clearance: 59 mL/min (A) (by C-G formula based on SCr of 1.2 mg/dL (H)).  Liver Function Tests: Recent Labs  Lab 01/08/21 2148  AST 24  ALT 16  ALKPHOS 83  BILITOT 1.4*  PROT 7.5  ALBUMIN 3.5    Recent Labs  Lab 01/08/21 2148  LIPASE <10*    No results for input(s): AMMONIA in the last 168 hours.  Coagulation Profile: Recent Labs  Lab 01/08/21 2253  INR 1.2     Cardiac Enzymes: No results for input(s): CKTOTAL, CKMB, CKMBINDEX, TROPONINI in the last 168 hours.  BNP (last 3 results) No results for input(s): PROBNP in the last 8760 hours.  Lipid Profile: No results for input(s):  CHOL, HDL, LDLCALC, TRIG, CHOLHDL, LDLDIRECT in the last 72 hours.  Thyroid Function Tests: No results for input(s): TSH, T4TOTAL, FREET4, T3FREE, THYROIDAB in the last 72 hours.   Anemia Panel: No results for input(s): VITAMINB12, FOLATE, FERRITIN, TIBC, IRON, RETICCTPCT in the last 72 hours.  Urine analysis:    Component Value Date/Time   COLORURINE AMBER (A) 01/09/2021 0048   APPEARANCEUR CLEAR 01/09/2021 0048   LABSPEC 1.016 01/09/2021 0048   PHURINE 6.0 01/09/2021 0048   GLUCOSEU NEGATIVE 01/09/2021 0048   HGBUR MODERATE (A) 01/09/2021 0048   BILIRUBINUR NEGATIVE 01/09/2021 0048   KETONESUR 20 (A) 01/09/2021 0048   PROTEINUR >=300 (A) 01/09/2021 0048   NITRITE NEGATIVE 01/09/2021 0048   LEUKOCYTESUR NEGATIVE 01/09/2021 0048    Sepsis Labs: Lactic Acid, Venous    Component Value Date/Time   LATICACIDVEN 1.2 01/08/2021 2253    MICROBIOLOGY: Recent Results (from the past 240 hour(s))  Blood culture (routine x 2)     Status: None (Preliminary result)   Collection Time: 01/08/21 11:29 PM   Specimen: BLOOD  Result Value Ref Range Status   Specimen Description   Final    BLOOD BOTTLES DRAWN AEROBIC ONLY Performed at [facility], [address], [place], [place] [address]    Special Requests   Final    Blood Culture results may not be optimal due to an inadequate volume of blood received in culture bottles BLOOD RIGHT HAND Performed at [facility], [address], [place], [place] [address]    Culture   Final    NO GROWTH 3 DAYS Performed at [facility], [address] [place], [place] [address]    Report Status PENDING  Incomplete  Blood culture (routine x 2)     Status: None (  Preliminary result)   Collection Time: 01/08/21 11:42 PM   Specimen: BLOOD  Result Value Ref Range Status   Specimen Description   Final    BLOOD BOTTLES DRAWN AEROBIC AND ANAEROBIC Performed at [facility], [address], [place], [place] [address]    Special Requests   Final    Blood Culture adequate volume BLOOD LEFT ARM Performed at [facility], [address], [place], [place] [address]    Culture   Final    NO GROWTH 3 DAYS Performed at [facility], [address], [place], [place] [address]    Report Status PENDING  Incomplete  Resp Panel by RT-PCR (Flu A&B, Covid) Peripheral     Status: None   Collection Time: 01/08/21 11:43 PM   Specimen: Peripheral; Nasopharyngeal(NP) swabs in vial transport medium  Result Value Ref Range Status   SARS Coronavirus 2 by RT PCR NEGATIVE NEGATIVE Final    Comment: (NOTE) SARS-CoV-2 target nucleic acids are NOT DETECTED.  The SARS-CoV-2 RNA is generally detectable in upper respiratory specimens during the acute phase of infection. The lowest concentration of SARS-CoV-2 viral copies this assay can detect is 138 copies/mL. A negative result does not preclude SARS-Cov-2 infection and should not be used as the sole basis for treatment or other patient management decisions. A negative result may occur with  improper specimen collection/handling, submission of specimen other than nasopharyngeal swab, presence of viral mutation(s) within the areas targeted by this assay, and inadequate number of viral copies(<138 copies/mL). A negative result must be combined with clinical observations, patient history, and epidemiological information. The expected result is Negative.  Fact Sheet for Patients:  BloggerCourse.com  Fact Sheet for Healthcare Providers:  SeriousBroker.it  This test is no t yet approved or cleared by the [place] FDA and  has been authorized for detection and/or diagnosis of SARS-CoV-2 by FDA under an Emergency Use Authorization (EUA). This EUA will remain  in effect (meaning this test can be used) for the duration of the COVID-19 declaration under  Section 564(b)(1) of the Act, 21 U.S.C.section 360bbb-3(b)(1), unless the authorization is terminated  or revoked sooner.       Influenza A by PCR NEGATIVE NEGATIVE Final   Influenza B by PCR NEGATIVE NEGATIVE Final    Comment: (NOTE) The Xpert Xpress SARS-CoV-2/FLU/RSV plus assay is intended as an aid in the diagnosis of influenza from Nasopharyngeal swab specimens and should not be used as a sole basis for treatment. Nasal washings and aspirates are unacceptable for Xpert Xpress SARS-CoV-2/FLU/RSV testing.  Fact Sheet for Patients: BloggerCourse.com  Fact Sheet for Healthcare Providers: SeriousBroker.it  This test is not yet approved or cleared by the [place] FDA and has been authorized for detection and/or diagnosis of SARS-CoV-2 by FDA under an Emergency Use Authorization (EUA). This EUA will remain in effect (meaning this test can be used) for the duration of the COVID-19 declaration under Section 564(b)(1) of the Act, 21 U.S.C. section 360bbb-3(b)(1), unless the authorization is terminated or revoked.  Performed at [facility], [address] [place], [place] [address]   Urine Culture     Status: Abnormal   Collection Time: 01/09/21 12:48 AM   Specimen: Urine, Clean Catch  Result Value Ref Range Status   Specimen Description URINE, CLEAN CATCH  Final   Special Requests   Final    NONE Performed at [facility], [address], [place], [place] [address]    Culture  MULTIPLE SPECIES PRESENT, SUGGEST RECOLLECTION (A)  Final   Report Status 01/10/2021 FINAL  Final  MRSA Next Gen by PCR, Nasal     Status: None   Collection Time: 01/09/21  1:11 PM   Specimen: Nasal Mucosa; Nasal Swab  Result Value Ref Range Status   MRSA by PCR Next Gen NOT DETECTED NOT DETECTED Final    Comment: (NOTE) The GeneXpert MRSA Assay (FDA approved for NASAL specimens only), is one component of a  comprehensive MRSA colonization surveillance program. It is not intended to diagnose MRSA infection nor to guide or monitor treatment for MRSA infections. Test performance is not FDA approved in patients less than 67 years old. Performed at [facility], [address] [place], [place] [address]     RADIOLOGY STUDIES/RESULTS: No results found.   LOS: 3 days   Jeoffrey Massed, MD  [facility]    To contact the attending provider between 7A-7P or the covering provider during after hours 7P-7A, please log into the web site [email or web address] and access using universal [place] password for that web site. If you do not have the password, please call the hospital operator.  01/12/2021, 1:24 PM

## 2021-01-12 NOTE — Consult Note (Signed)
WOC Nurse Consult Note: Reason for Consult:chronic lymphedema without ulceration. Skin manifestations consistent with lymphedema.  Patient underwent CDT with a PT credentialed in this therapy in 2021 and followed up with Laroy Apple at the outpatient [facility] until a wound was healed that same year.  She has lymphedema pumps at home. Wound type:N/A Pressure Injury POA: N/A Measurement:N/A Wound bed:N/A Drainage (amount, consistency, odor) N/A Periwound: skin manifestations consistent with lymphedema. Dressing procedure/placement/frequency: POC to use Unna's Boots for sustained compression and patient to get her lymphedema pumps from home to the hospital and downstream care setting for resumption of use. Nursing to wash and dry legs and contact Ortho Tech for Unna's boot application.  Changes are to be weekly.  WOC nursing team will not follow, but will remain available to this patient, the nursing and medical teams.  Please re-consult if needed. Thanks, Ladona Mow, MSN, RN, GNP, [place]  Pager# [phone]

## 2021-01-13 NOTE — Progress Notes (Signed)
Orthopedic Tech Progress Note Patient Details:  [name] 03/26/1951 [id number] Original coban layer of patient's unna boots were taken off due to being "too tight". I rewrapped both legs loosely with coban with the assistance of the RN and NT. Patient says new wrappings are now comfortable.  Patient ID: [name], female   DOB: [birth year], 70 y.o.   MRN: [id number]  Smitty Pluck 01/13/2021, 6:22 AM

## 2021-01-13 NOTE — Progress Notes (Signed)
PROGRESS NOTE        PATIENT DETAILS Name: [name] Age: 70 y.o. Sex: female Date of Birth: August 23, 1950 Admit Date: 01/08/2021 Admitting Physician Eduard Clos, MD JXB:[id number], Jasmine December, MD  Brief Narrative: Patient is a 70 y.o. female with history of chronic bilateral lower extremity lymphedema, seizures, OSA (noncompliant to CPAP) HLD, SAH-s/p craniotomy and aneurysm clipping-presented with confusion-and sepsis physiology secondary to RLE cellulitis.  Significant events: 8/31>> fever-confusion-RLE cellulitis-admit to [facility].  Significant studies: 8/31>> CXR: No pneumonia. 8/31>> Doppler bilateral lower extremity: No DVT 8/31>> CT head: No acute intracranial abnormality. 9/01>> CT angio head/neck: No acute abnormality-no change from CTA done in 2014 (fusiform aneurysm of the supraclinoid ICA on right-s/p stenting, aneurysmal disease dilatation distal to the stent, aneurysmal clip at the right MCA bifurcation, right PCA origin). 9/01>> x-ray right tibia/fibula: No acute finding. 9/01>> x-ray right foot: No acute finding. 9/01>> EEG: No seizures  Antimicrobial therapy: Vancomycin: 8/31>>9/4 Cefepime: 8/31>>9/1 Doxy: 9/4>>  Microbiology data: 8/31>> influenza/COVID PCR: 8/31>> blood culture: no growth 9/01>> urine culture: no growth  Procedures : None  Consults: Neurology  DVT Prophylaxis : Prophylactic Lovenox  Subjective: Awake/alert-no major issues overnight.   Assessment/Plan: Sepsis due to RLE cellulitis: Sepsis physiology has resolved-improvement in RLE erythema (has lymphedema at baseline).  Initially on IV vancomycin-has been transitioned to doxycycline.    Acute metabolic encephalopathy: Likely due to sepsis-has improved-suspect patient not far from baseline.  Neuroimaging was negative for any abnormalities-EEG without seizures.  Neurology following with recommendations to continue supportive care.  Seizure disorder: EEG  negative-continue phenobarbital.  HTN: BP stable-continue ARB/amlodipine  HLD: Continue statin  HFpEF/bilateral lower extremity lymphedema: Volume status stable-unchanged-continue Aldactone/metolazone/Demadex.  She was previously noncompliant to compression therapy in the past-however she changed her mind-and hence her legs have now been wrapped.  History of rosacea: On doxycycline  OSA: CPAP at night-but has been noncompliant lately  Morbid Obesity: Estimated body mass index is 54.87 kg/m as calculated from the following:   Height as of this encounter: 5\' 2"  (1.575 m).   Weight as of this encounter: 136.1 kg.     Diet: Diet Order             Diet Heart Room service appropriate? Yes with Assist; Fluid consistency: Thin; Fluid restriction: 1200 mL Fluid  Diet effective now                    Code Status: Full code   Family Communication: None at bedside this morning.  Disposition Plan: Status is: Inpatient  Remains inpatient appropriate because:Inpatient level of care appropriate due to severity of illness  Dispo: The patient is from: Home              Anticipated d/c is to: Home              Patient currently is  medically stable to d/c.   Difficult to place patient No    Barriers to Discharge: Awaiting SNF bed.  Antimicrobial agents: Anti-infectives (From admission, onward)    Start     Dose/Rate Route Frequency Ordered Stop   01/15/21 1000  doxycycline (VIBRAMYCIN) 50 MG capsule 50 mg        50 mg Oral Every 24 hours 01/12/21 1329     01/12/21 1415  doxycycline (VIBRA-TABS) tablet 100 mg  100 mg Oral Every 12 hours 01/12/21 1329 01/15/21 0959   01/10/21 0000  vancomycin (VANCOREADY) IVPB 1500 mg/300 mL  Status:  Discontinued        1,500 mg 150 mL/hr over 120 Minutes Intravenous Every 24 hours 01/09/21 0514 01/12/21 1327   01/09/21 1200  doxycycline (VIBRAMYCIN) 50 MG capsule 50 mg  Status:  Discontinued        50 mg Oral Every 24 hours  01/09/21 0905 01/12/21 1329   01/09/21 1000  doxycycline (VIBRAMYCIN) 50 MG capsule 50 mg  Status:  Discontinued        50 mg Oral Daily 01/09/21 0904 01/09/21 0905   01/09/21 0600  ceFEPIme (MAXIPIME) 2 g in sodium chloride 0.9 % 100 mL IVPB  Status:  Discontinued        2 g 200 mL/hr over 30 Minutes Intravenous Every 8 hours 01/09/21 0514 01/10/21 1040   01/09/21 0445  vancomycin (VANCOCIN) IVPB 1000 mg/200 mL premix  Status:  Discontinued        1,000 mg 200 mL/hr over 60 Minutes Intravenous  Once 01/09/21 0444 01/09/21 0448   01/09/21 0445  ceFEPIme (MAXIPIME) 2 g in sodium chloride 0.9 % 100 mL IVPB  Status:  Discontinued        2 g 200 mL/hr over 30 Minutes Intravenous  Once 01/09/21 0444 01/09/21 0514   01/09/21 0115  vancomycin (VANCOREADY) IVPB 2000 mg/400 mL        2,000 mg 200 mL/hr over 120 Minutes Intravenous  Once 01/09/21 0059 01/09/21 0423   01/09/21 0100  piperacillin-tazobactam (ZOSYN) IVPB 3.375 g        3.375 g 100 mL/hr over 30 Minutes Intravenous  Once 01/09/21 0059 01/09/21 0211        Time spent: 25 minutes-Greater than 50% of this time was spent in counseling, explanation of diagnosis, planning of further management, and coordination of care.  MEDICATIONS: Scheduled Meds:  amLODipine  5 mg Oral Daily   aspirin EC  81 mg Oral BID   doxycycline  100 mg Oral Q12H   [START ON 01/15/2021] doxycycline  50 mg Oral Q24H   enoxaparin (LOVENOX) injection  60 mg Subcutaneous Q24H   ferrous sulfate  650 mg Oral Q breakfast   gabapentin  200 mg Oral TID   hydrOXYzine  25 mg Oral Daily   irbesartan  75 mg Oral BID   magnesium oxide  1,200 mg Oral QHS   metolazone  5 mg Oral Daily   montelukast  10 mg Oral QHS   pantoprazole  40 mg Oral QHS   PHENobarbital  129.6 mg Oral QHS   potassium chloride SA  80 mEq Oral Daily   pramipexole  0.25 mg Oral Q6H   rosuvastatin  20 mg Oral Daily   spironolactone  50 mg Oral Daily   torsemide  20 mg Oral Daily   zinc sulfate   220 mg Oral Daily   Continuous Infusions:   PRN Meds:.   PHYSICAL EXAM: Vital signs: Vitals:   01/13/21 0000 01/13/21 0334 01/13/21 0750 01/13/21 1222  BP: (!) 144/62 116/62 103/84 (!) 118/59  Pulse: 76 74 81 81  Resp: 18 18 15 17   Temp:  98.4 F (36.9 C) 98.4 F (36.9 C) 98.5 F (36.9 C)  TempSrc:  Oral Oral Oral  SpO2: 94% 98% 93% 92%  Weight:      Height:       Filed Weights   01/08/21 1838  Weight: 136.1 kg  Body mass index is 54.87 kg/m.   Gen Exam:Alert awake-not in any distress HEENT:atraumatic, normocephalic Chest: B/L clear to auscultation anteriorly CVS:S1S2 regular Abdomen:soft non tender, non distended Extremities: Both legs wrapped with unna wraps. Neurology: Non focal Skin: no rash   I have personally reviewed following labs and imaging studies  LABORATORY DATA: CBC: Recent Labs  Lab 01/08/21 2148 01/09/21 0747 01/10/21 1118 01/11/21 0104  WBC 10.7* 9.7 12.0* 9.1  NEUTROABS 8.4*  --   --   --   HGB 11.3* 9.7* 11.6* 10.5*  HCT 32.7* 29.1* 33.7* 31.0*  MCV 93.7 95.7 94.4 96.0  PLT 197 168 291 246     Basic Metabolic Panel: Recent Labs  Lab 01/08/21 2148 01/09/21 0747 01/10/21 1118 01/11/21 0104 01/12/21 0015  NA 135 133* 134* 133* 133*  K 3.4* 3.4* 3.8 3.6 4.4  CL 99 103 99 96* 92*  CO2 24 20* 24 28 31   GLUCOSE 99 135* 97 115* 110*  BUN 13 13 17 20 23   CREATININE 0.93 0.98 1.04* 1.24* 1.20*  CALCIUM 9.6 8.6* 9.4 8.9 9.2  MG  --   --  1.7 2.0  --      GFR: Estimated Creatinine Clearance: 59 mL/min (A) (by C-G formula based on SCr of 1.2 mg/dL (H)).  Liver Function Tests: Recent Labs  Lab 01/08/21 2148  AST 24  ALT 16  ALKPHOS 83  BILITOT 1.4*  PROT 7.5  ALBUMIN 3.5    Recent Labs  Lab 01/08/21 2148  LIPASE <10*    No results for input(s): AMMONIA in the last 168 hours.  Coagulation Profile: Recent Labs  Lab 01/08/21 2253  INR 1.2     Cardiac Enzymes: No results for input(s): CKTOTAL, CKMB,  CKMBINDEX, TROPONINI in the last 168 hours.  BNP (last 3 results) No results for input(s): PROBNP in the last 8760 hours.  Lipid Profile: No results for input(s): CHOL, HDL, LDLCALC, TRIG, CHOLHDL, LDLDIRECT in the last 72 hours.  Thyroid Function Tests: No results for input(s): TSH, T4TOTAL, FREET4, T3FREE, THYROIDAB in the last 72 hours.   Anemia Panel: No results for input(s): VITAMINB12, FOLATE, FERRITIN, TIBC, IRON, RETICCTPCT in the last 72 hours.  Urine analysis:    Component Value Date/Time   COLORURINE AMBER (A) 01/09/2021 0048   APPEARANCEUR CLEAR 01/09/2021 0048   LABSPEC 1.016 01/09/2021 0048   PHURINE 6.0 01/09/2021 0048   GLUCOSEU NEGATIVE 01/09/2021 0048   HGBUR MODERATE (A) 01/09/2021 0048   BILIRUBINUR NEGATIVE 01/09/2021 0048   KETONESUR 20 (A) 01/09/2021 0048   PROTEINUR >=300 (A) 01/09/2021 0048   NITRITE NEGATIVE 01/09/2021 0048   LEUKOCYTESUR NEGATIVE 01/09/2021 0048    Sepsis Labs: Lactic Acid, Venous    Component Value Date/Time   LATICACIDVEN 1.2 01/08/2021 2253    MICROBIOLOGY: Recent Results (from the past 240 hour(s))  Blood culture (routine x 2)     Status: None (Preliminary result)   Collection Time: 01/08/21 11:29 PM   Specimen: BLOOD  Result Value Ref Range Status   Specimen Description   Final    BLOOD BOTTLES DRAWN AEROBIC ONLY Performed at [facility], [address], [place], [place] [address]    Special Requests   Final    Blood Culture results may not be optimal due to an inadequate volume of blood received in culture bottles BLOOD RIGHT HAND Performed at [facility], [address], [place], [place] [address]    Culture   Final    NO  GROWTH 4 DAYS Performed at [facility], [address] [place], [place] [address]    Report Status PENDING  Incomplete  Blood culture (routine x 2)     Status: None (Preliminary result)   Collection Time: 01/08/21 11:42 PM   Specimen: BLOOD   Result Value Ref Range Status   Specimen Description   Final    BLOOD BOTTLES DRAWN AEROBIC AND ANAEROBIC Performed at [facility], [address], [place], [place] [address]    Special Requests   Final    Blood Culture adequate volume BLOOD LEFT ARM Performed at [facility], [address] [place], [place] [address]    Culture   Final    NO GROWTH 4 DAYS Performed at [facility], [address] [place], [place] [address]    Report Status PENDING  Incomplete  Resp Panel by RT-PCR (Flu A&B, Covid) Peripheral     Status: None   Collection Time: 01/08/21 11:43 PM   Specimen: Peripheral; Nasopharyngeal(NP) swabs in vial transport medium  Result Value Ref Range Status   SARS Coronavirus 2 by RT PCR NEGATIVE NEGATIVE Final    Comment: (NOTE) SARS-CoV-2 target nucleic acids are NOT DETECTED.  The SARS-CoV-2 RNA is generally detectable in upper respiratory specimens during the acute phase of infection. The lowest concentration of SARS-CoV-2 viral copies this assay can detect is 138 copies/mL. A negative result does not preclude SARS-Cov-2 infection and should not be used as the sole basis for treatment or other patient management decisions. A negative result may occur with  improper specimen collection/handling, submission of specimen other than nasopharyngeal swab, presence of viral mutation(s) within the areas targeted by this assay, and inadequate number of viral copies(<138 copies/mL). A negative result must be combined with clinical observations, patient history, and epidemiological information. The expected result is Negative.  Fact Sheet for Patients:  BloggerCourse.com  Fact Sheet for Healthcare Providers:  SeriousBroker.it  This test is no t yet approved or cleared by the [place] FDA and  has been authorized for detection and/or diagnosis of SARS-CoV-2 by FDA under  an Emergency Use Authorization (EUA). This EUA will remain  in effect (meaning this test can be used) for the duration of the COVID-19 declaration under Section 564(b)(1) of the Act, 21 U.S.C.section 360bbb-3(b)(1), unless the authorization is terminated  or revoked sooner.       Influenza A by PCR NEGATIVE NEGATIVE Final   Influenza B by PCR NEGATIVE NEGATIVE Final    Comment: (NOTE) The Xpert Xpress SARS-CoV-2/FLU/RSV plus assay is intended as an aid in the diagnosis of influenza from Nasopharyngeal swab specimens and should not be used as a sole basis for treatment. Nasal washings and aspirates are unacceptable for Xpert Xpress SARS-CoV-2/FLU/RSV testing.  Fact Sheet for Patients: BloggerCourse.com  Fact Sheet for Healthcare Providers: SeriousBroker.it  This test is not yet approved or cleared by the [place] FDA and has been authorized for detection and/or diagnosis of SARS-CoV-2 by FDA under an Emergency Use Authorization (EUA). This EUA will remain in effect (meaning this test can be used) for the duration of the COVID-19 declaration under Section 564(b)(1) of the Act, 21 U.S.C. section 360bbb-3(b)(1), unless the authorization is terminated or revoked.  Performed at [facility], [address], [place], [place] [address]   Urine Culture     Status: Abnormal   Collection Time: 01/09/21 12:48 AM   Specimen: Urine, Clean Catch  Result Value Ref Range Status  Specimen Description URINE, CLEAN CATCH  Final   Special Requests   Final    NONE Performed at [facility], [address] [place], [place] [address]    Culture MULTIPLE SPECIES PRESENT, SUGGEST RECOLLECTION (A)  Final   Report Status 01/10/2021 FINAL  Final  MRSA Next Gen by PCR, Nasal     Status: None   Collection Time: 01/09/21  1:11 PM   Specimen: Nasal Mucosa; Nasal Swab  Result Value Ref Range Status   MRSA by PCR Next Gen  NOT DETECTED NOT DETECTED Final    Comment: (NOTE) The GeneXpert MRSA Assay (FDA approved for NASAL specimens only), is one component of a comprehensive MRSA colonization surveillance program. It is not intended to diagnose MRSA infection nor to guide or monitor treatment for MRSA infections. Test performance is not FDA approved in patients less than 49 years old. Performed at [facility], [address] [place], [place] [address]     RADIOLOGY STUDIES/RESULTS: No results found.   LOS: 4 days   Jeoffrey Massed, MD  [facility]    To contact the attending provider between 7A-7P or the covering provider during after hours 7P-7A, please log into the web site [email or web address] and access using universal [place] password for that web site. If you do not have the password, please call the hospital operator.  01/13/2021, 2:09 PM

## 2021-01-13 NOTE — Plan of Care (Signed)

## 2021-01-13 NOTE — TOC Progression Note (Addendum)
Transition of Care [facility]) - Progression Note    Patient Details  Name: [name] MRN: [id number] Date of Birth: Sep 16, 1950  [facility]) CM/SW Contact  Joceline Hinchcliff, LCSWA Phone Number: 01/13/2021, 2:04 PM  Clinical Narrative:    CSW spoke with the patient's spouse, Chrissie Noa as the patient was asleep. CSW informed Chrissie Noa about the SNF, [facility] for rehab when [name] is medically ready for discharge. CSW provided the location to the facility and discussed about the insurance authorization process and will keep the patient and family updated. Awaiting an updated PT note before CSW can start insurance authorization.   Expected Discharge Plan: Skilled Nursing Facility Barriers to Discharge: Continued Medical Work up, SNF Pending bed offer, English as a second language teacher  Expected Discharge Plan and Services Expected Discharge Plan: Skilled Nursing Facility In-house Referral: Clinical Social Work   Post Acute Care Choice: Skilled Nursing Facility Living arrangements for the past 2 months: Single Family Home                                       Social Determinants of Health (SDOH) Interventions    Readmission Risk Interventions No flowsheet data found.

## 2021-01-13 NOTE — Progress Notes (Signed)
Orthopedic Tech Progress Note Patient Details:  [name] 06-23-1950 [id number] Roland Rack Boots were applied with assistance from patient's nurse. Ortho Devices Type of Ortho Device: Radio broadcast assistant Ortho Device/Splint Location: Bi LE Ortho Device/Splint Interventions: Application   Post Interventions Patient Tolerated: Well  [name] 01/13/2021, 3:59 AM

## 2021-01-14 LAB — CULTURE, BLOOD (ROUTINE X 2)
Culture: NO GROWTH
Culture: NO GROWTH
Special Requests: ADEQUATE

## 2021-01-14 MED ORDER — OXYCODONE-ACETAMINOPHEN 5-325 MG PO TABS
1.0000 | ORAL_TABLET | Freq: Four times a day (QID) | ORAL | Status: DC | PRN
Start: 2021-01-14 — End: 2021-01-15
  Administered 2021-01-14 – 2021-01-15 (×2): 1 via ORAL
  Filled 2021-01-14 (×2): qty 1

## 2021-01-14 MED ORDER — METOLAZONE 5 MG PO TABS
5.0000 mg | ORAL_TABLET | ORAL | Status: DC
  Filled 2021-01-14: qty 1

## 2021-01-14 NOTE — Progress Notes (Signed)
Physical Therapy Treatment Patient Details Name: [name] MRN: [id number] DOB: 1951-05-04 Today's Date: 01/14/2021    History of Present Illness Pt is 70 y.o. female presenting with confusion and admitted with sepsis secondary to R LE cellulitis on 01/08/21.  Pt with history of sleep apnea, seizures, hypertension, chronic lymphedema of the lower extremity, hyperlipidemia who has had prior history of craniotomy for subarachnoid hemorrhage with aneurysmal clipping.    PT Comments    Patient initially sleeping on arrival. She was eager to work with therapy due to "I've been in this bed all day." As initiating movement, pt with severe pain in RLE and throughout session requesting to have wraps removed from legs (RN made aware of pain and pt request at end of session). Pt not able to tolerate bending rt knee in sitting to get her foot underneath her to attempt sit to stand with RW. Elevated bed and obtained stedy lift, however pt still could not bend rt knee enough to be able to reach cross bar and attempt standing in stedy. Patient had to return to supine. She was able to scoot herself up to the [facility] using rails and bed in trendelenburg.    Follow Up Recommendations  SNF     Equipment Recommendations  None recommended by PT    Recommendations for Other Services       Precautions / Restrictions Precautions Precautions: Fall Precaution Comments: difficulty moving/bending R knee Restrictions Weight Bearing Restrictions: No    Mobility  Bed Mobility Overal bed mobility: Needs Assistance Bed Mobility: Sit to Supine;Rolling;Sidelying to Sit Rolling: Max assist;+2 for physical assistance (pt began resisting due to pain and could not fully roll) Sidelying to sit: Max assist;+2 for physical assistance;HOB elevated   Sit to supine: +2 for physical assistance;Max assist   General bed mobility comments: Requiring increased time, heavy use of rails, assist for trunk and legs.  Pt often  moving in motions opposite of how therapist trying to assist and will pull - needs cues to make sure we are working together.   Of note pt does not sleep  in bed at home, and therefore does not regularly perform bed mobility.    Transfers Overall transfer level: Needs assistance   Transfers: Sit to/from Stand Sit to Stand: From elevated surface (unable to attempt due to could not bend RLE to place foot underneath her; attempted stedy and could not bend rt knee enough to be able to reach cross bar)            Ambulation/Gait             General Gait Details: unable to stand due to pain   Stairs             Wheelchair Mobility    Modified Rankin (Stroke Patients Only)       Balance Overall balance assessment: Needs assistance Sitting-balance support: Feet supported;No upper extremity supported Sitting balance-Leahy Scale: Poor                                      Cognition Arousal/Alertness: Awake/alert Behavior During Therapy: Anxious (anticipating pain) Overall Cognitive Status: Impaired/Different from baseline Area of Impairment: Following commands;Safety/judgement;Problem solving;Orientation;Memory;Awareness                 Orientation Level: Place;Time;Situation   Memory: Decreased short-term memory (did not recall previous PT session) Following Commands: Follows one step commands  with increased time;Follows one step commands inconsistently Safety/Judgement: Decreased awareness of safety Awareness: Intellectual Problem Solving: Slow processing;Requires verbal cues;Decreased initiation;Difficulty sequencing;Requires tactile cues General Comments: initially drowsy, but as pain increased in RLE, pt more awake; required repeated cues and at times hand-over-hand guidance to move in direction requested      Exercises General Exercises - Lower Extremity Ankle Circles/Pumps: AROM;Both;10 reps    General Comments        Pertinent  Vitals/Pain Pain Assessment: 0-10 Pain Score: 8  Pain Location: RLE and back Pain Descriptors / Indicators: Sore;Moaning;Guarding Pain Intervention(s): Monitored during session;Repositioned    Home Living Family/patient expects to be discharged to:: Private residence Living Arrangements: Spouse/significant other Available Help at Discharge: Family;Available 24 hours/day Type of Home: House Home Access: Level entry   Home Layout: Two level;1/2 bath on main level;Able to live on main level with bedroom/bathroom (Pt's bedroom is upstairs but she does not go up.  She sleeps in lift chair on first floor and does sponge baths) Home Equipment: Walker - 2 wheels;Cane - single point;Shower seat;Grab bars - tub/shower;Hand held shower head;Transport chair;Adaptive equipment;Bedside commode;Other (comment);Wheelchair - manual Additional Comments: sleeps in recliner/lift chair, walks to half bath at night.    Prior Function Level of Independence: Needs assistance  Gait / Transfers Assistance Needed: Pt ambulate with RW at home; reports will push w/c if she needs to carry items but does not typically sit in w/c to use. Can ambulate short community distances ADL's / Homemaking Assistance Needed: Does sponge baths on her own; has assist with lower body dressing; does toileting on her own Comments: .   PT Goals (current goals can now be found in the care plan section) Acute Rehab PT Goals Patient Stated Goal: agreeable to SNF; decrease pain Time For Goal Achievement: 01/24/21 Potential to Achieve Goals: Good Progress towards PT goals: Not progressing toward goals - comment (limited by LE pain)    Frequency    Min 2X/week      PT Plan Current plan remains appropriate    Co-evaluation              AM-PAC PT "6 Clicks" Mobility   Outcome Measure  Help needed turning from your back to your side while in a flat bed without using bedrails?: Total Help needed moving from lying on your back  to sitting on the side of a flat bed without using bedrails?: Total Help needed moving to and from a bed to a chair (including a wheelchair)?: Total Help needed standing up from a chair using your arms (e.g., wheelchair or bedside chair)?: Total Help needed to walk in hospital room?: Total Help needed climbing 3-5 steps with a railing? : Total 6 Click Score: 6    End of Session Equipment Utilized During Treatment: Gait belt Activity Tolerance: Patient limited by pain;Patient limited by lethargy (nausea/lightheaded) Patient left: in bed;with call bell/phone within reach;with bed alarm set Nurse Communication: Mobility status;Other (comment) (painful legs limiting participation) PT Visit Diagnosis: Unsteadiness on feet (R26.81);Muscle weakness (generalized) (M62.81);Pain Pain - Right/Left: Right Pain - part of body: Ankle and joints of foot     Time: 1411-1449 PT Time Calculation (min) (ACUTE ONLY): 38 min  Charges:  $Therapeutic Activity: 38-52 mins                      Jerolyn Center, PT Pager [phone]    Zena Amos 01/14/2021, 3:41 PM

## 2021-01-14 NOTE — TOC Progression Note (Signed)
Transition of Care [facility]) - Progression Note    Patient Details  Name: [name] MRN: [id number] Date of Birth: 1951-04-25  Transition of Care [facility]) CM/SW Contact  Mearl Latin, LCSW Phone Number: 01/14/2021, 5:26 PM  Clinical Narrative:    [facility] Grumbling authorization pending for [facility]; clinicals submitted. Ref# [id number].   Expected Discharge Plan: Skilled Nursing Facility Barriers to Discharge: Continued Medical Work up, SNF Pending bed offer, English as a second language teacher  Expected Discharge Plan and Services Expected Discharge Plan: Skilled Nursing Facility In-house Referral: Clinical Social Work   Post Acute Care Choice: Skilled Nursing Facility Living arrangements for the past 2 months: Single Family Home                                       Social Determinants of Health (SDOH) Interventions    Readmission Risk Interventions No flowsheet data found.

## 2021-01-14 NOTE — Care Management Important Message (Signed)
Important Message  Patient Details  Name: [name] MRN: [id number] Date of Birth: 10-Aug-1950   Medicare Important Message Given:        [name] 01/14/2021, 3:46 PM

## 2021-01-14 NOTE — Progress Notes (Signed)
Pt has not had a bowel movement since PTA to hospital. Dr. Jerral Ralph made aware. Awaiting new orders.

## 2021-01-14 NOTE — Progress Notes (Signed)
PROGRESS NOTE        PATIENT DETAILS Name: [name] Age: 70 y.o. Sex: female Date of Birth: [birth year] Admit Date: 01/08/2021 Admitting Physician Eduard ClosArshad N Kakrakandy, MD ZOX:[id number]:Wolters, Jasmine DecemberSharon, MD  Brief Narrative: Patient is a 70 y.o. female with history of chronic bilateral lower extremity lymphedema, seizures, OSA (noncompliant to CPAP) HLD, SAH-s/p craniotomy and aneurysm clipping-presented with confusion-and sepsis physiology secondary to RLE cellulitis.  Significant events: 8/31>> fever-confusion-RLE cellulitis-admit to [facility].  Significant studies: 8/31>> CXR: No pneumonia. 8/31>> Doppler bilateral lower extremity: No DVT 8/31>> CT head: No acute intracranial abnormality. 9/01>> CT angio head/neck: No acute abnormality-no change from CTA done in 2014 (fusiform aneurysm of the supraclinoid ICA on right-s/p stenting, aneurysmal disease dilatation distal to the stent, aneurysmal clip at the right MCA bifurcation, right PCA origin). 9/01>> x-ray right tibia/fibula: No acute finding. 9/01>> x-ray right foot: No acute finding. 9/01>> EEG: No seizures  Antimicrobial therapy: Vancomycin: 8/31>>9/4 Cefepime: 8/31>>9/1 Doxy: 9/4>>  Microbiology data: 8/31>> influenza/COVID PCR: 8/31>> blood culture: no growth 9/01>> urine culture: no growth  Procedures : None  Consults: Neurology  DVT Prophylaxis : Prophylactic Lovenox  Subjective: Completely awake and alert-denies any chest pain or shortness of breath.  Tolerating compression of both lower extremities-Unna boots in place.   Assessment/Plan: Sepsis due to RLE cellulitis: Sepsis physiology has resolved-improvement in RLE erythema (has lymphedema at baseline).  Initially on IV vancomycin-has been transitioned to doxycycline.    Acute metabolic encephalopathy: Likely due to sepsis-has improved-suspect patient not far from baseline.  Neuroimaging was negative for any abnormalities-EEG without  seizures.  Neurology following with recommendations to continue supportive care.  Seizure disorder: EEG negative-continue phenobarbital.  HTN: BP stable-continue ARB/amlodipine  HLD: Continue statin  HFpEF/bilateral lower extremity lymphedema: Volume status stable-unchanged-continue Aldactone/metolazone/Demadex.  She was previously noncompliant to compression therapy in the past-however she changed her mind-and hence her legs have now been wrapped.  History of rosacea: On doxycycline  OSA: CPAP at night-but has been noncompliant lately  Morbid Obesity: Estimated body mass index is 54.87 kg/m as calculated from the following:   Height as of this encounter: 5\' 2"  (1.575 m).   Weight as of this encounter: 136.1 kg.     Diet: Diet Order             Diet Heart Room service appropriate? Yes with Assist; Fluid consistency: Thin; Fluid restriction: 1200 mL Fluid  Diet effective now                    Code Status: Full code   Family Communication: Spoke with [phone]-over the phone on 9/6.  Disposition Plan: Status is: Inpatient  Remains inpatient appropriate because:Inpatient level of care appropriate due to severity of illness  Dispo: The patient is from: Home              Anticipated d/c is to: Home              Patient currently is  medically stable to d/c.   Difficult to place patient No    Barriers to Discharge: Awaiting SNF bed.  Antimicrobial agents: Anti-infectives (From admission, onward)    Start     Dose/Rate Route Frequency Ordered Stop   01/15/21 1000  doxycycline (VIBRAMYCIN) 50 MG capsule 50 mg        50 mg Oral Every 24  hours 01/12/21 1329     01/12/21 1415  doxycycline (VIBRA-TABS) tablet 100 mg        100 mg Oral Every 12 hours 01/12/21 1329 01/15/21 0959   01/10/21 0000  vancomycin (VANCOREADY) IVPB 1500 mg/300 mL  Status:  Discontinued        1,500 mg 150 mL/hr over 120 Minutes Intravenous Every 24 hours 01/09/21 0514 01/12/21  1327   01/09/21 1200  doxycycline (VIBRAMYCIN) 50 MG capsule 50 mg  Status:  Discontinued        50 mg Oral Every 24 hours 01/09/21 0905 01/12/21 1329   01/09/21 1000  doxycycline (VIBRAMYCIN) 50 MG capsule 50 mg  Status:  Discontinued        50 mg Oral Daily 01/09/21 0904 01/09/21 0905   01/09/21 0600  ceFEPIme (MAXIPIME) 2 g in sodium chloride 0.9 % 100 mL IVPB  Status:  Discontinued        2 g 200 mL/hr over 30 Minutes Intravenous Every 8 hours 01/09/21 0514 01/10/21 1040   01/09/21 0445  vancomycin (VANCOCIN) IVPB 1000 mg/200 mL premix  Status:  Discontinued        1,000 mg 200 mL/hr over 60 Minutes Intravenous  Once 01/09/21 0444 01/09/21 0448   01/09/21 0445  ceFEPIme (MAXIPIME) 2 g in sodium chloride 0.9 % 100 mL IVPB  Status:  Discontinued        2 g 200 mL/hr over 30 Minutes Intravenous  Once 01/09/21 0444 01/09/21 0514   01/09/21 0115  vancomycin (VANCOREADY) IVPB 2000 mg/400 mL        2,000 mg 200 mL/hr over 120 Minutes Intravenous  Once 01/09/21 0059 01/09/21 0423   01/09/21 0100  piperacillin-tazobactam (ZOSYN) IVPB 3.375 g        3.375 g 100 mL/hr over 30 Minutes Intravenous  Once 01/09/21 0059 01/09/21 0211        Time spent: 25 minutes-Greater than 50% of this time was spent in counseling, explanation of diagnosis, planning of further management, and coordination of care.  MEDICATIONS: Scheduled Meds:  amLODipine  5 mg Oral Daily   aspirin EC  81 mg Oral BID   doxycycline  100 mg Oral Q12H   [START ON 01/15/2021] doxycycline  50 mg Oral Q24H   enoxaparin (LOVENOX) injection  60 mg Subcutaneous Q24H   ferrous sulfate  650 mg Oral Q breakfast   gabapentin  200 mg Oral TID   hydrOXYzine  25 mg Oral Daily   irbesartan  75 mg Oral BID   magnesium oxide  1,200 mg Oral QHS   [START ON 01/15/2021] metolazone  5 mg Oral Q48H   montelukast  10 mg Oral QHS   pantoprazole  40 mg Oral QHS   PHENobarbital  129.6 mg Oral QHS   potassium chloride SA  80 mEq Oral Daily    pramipexole  0.25 mg Oral Q6H   rosuvastatin  20 mg Oral Daily   spironolactone  50 mg Oral Daily   torsemide  20 mg Oral Daily   zinc sulfate  220 mg Oral Daily   Continuous Infusions:   PRN Meds:.   PHYSICAL EXAM: Vital signs: Vitals:   01/14/21 0354 01/14/21 0734 01/14/21 1215 01/14/21 1235  BP: (!) 106/49 115/61 (!) 129/51   Pulse: 64 65 73   Resp: 20 18 17    Temp: 98.4 F (36.9 C) (!) 97.3 F (36.3 C) (!) 96.9 F (36.1 C) 98 F (36.7 C)  TempSrc: Oral Oral Oral Oral  SpO2: (!) 65% 90% 96%   Weight:      Height:       Filed Weights   01/08/21 1838  Weight: 136.1 kg   Body mass index is 54.87 kg/m.   Gen Exam:Alert awake-not in any distress HEENT:atraumatic, normocephalic Chest: B/L clear to auscultation anteriorly CVS:S1S2 regular Abdomen:soft non tender, non distended Extremities: Both legs in Unna boots. Neurology: Non focal Skin: no rash   I have personally reviewed following labs and imaging studies  LABORATORY DATA: CBC: Recent Labs  Lab 01/08/21 2148 01/09/21 0747 01/10/21 1118 01/11/21 0104  WBC 10.7* 9.7 12.0* 9.1  NEUTROABS 8.4*  --   --   --   HGB 11.3* 9.7* 11.6* 10.5*  HCT 32.7* 29.1* 33.7* 31.0*  MCV 93.7 95.7 94.4 96.0  PLT 197 168 291 246     Basic Metabolic Panel: Recent Labs  Lab 01/08/21 2148 01/09/21 0747 01/10/21 1118 01/11/21 0104 01/12/21 0015  NA 135 133* 134* 133* 133*  K 3.4* 3.4* 3.8 3.6 4.4  CL 99 103 99 96* 92*  CO2 24 20* 24 28 31   GLUCOSE 99 135* 97 115* 110*  BUN 13 13 17 20 23   CREATININE 0.93 0.98 1.04* 1.24* 1.20*  CALCIUM 9.6 8.6* 9.4 8.9 9.2  MG  --   --  1.7 2.0  --      GFR: Estimated Creatinine Clearance: 59 mL/min (A) (by C-G formula based on SCr of 1.2 mg/dL (H)).  Liver Function Tests: Recent Labs  Lab 01/08/21 2148  AST 24  ALT 16  ALKPHOS 83  BILITOT 1.4*  PROT 7.5  ALBUMIN 3.5    Recent Labs  Lab 01/08/21 2148  LIPASE <10*    No results for input(s): AMMONIA in the  last 168 hours.  Coagulation Profile: Recent Labs  Lab 01/08/21 2253  INR 1.2     Cardiac Enzymes: No results for input(s): CKTOTAL, CKMB, CKMBINDEX, TROPONINI in the last 168 hours.  BNP (last 3 results) No results for input(s): PROBNP in the last 8760 hours.  Lipid Profile: No results for input(s): CHOL, HDL, LDLCALC, TRIG, CHOLHDL, LDLDIRECT in the last 72 hours.  Thyroid Function Tests: No results for input(s): TSH, T4TOTAL, FREET4, T3FREE, THYROIDAB in the last 72 hours.   Anemia Panel: No results for input(s): VITAMINB12, FOLATE, FERRITIN, TIBC, IRON, RETICCTPCT in the last 72 hours.  Urine analysis:    Component Value Date/Time   COLORURINE AMBER (A) 01/09/2021 0048   APPEARANCEUR CLEAR 01/09/2021 0048   LABSPEC 1.016 01/09/2021 0048   PHURINE 6.0 01/09/2021 0048   GLUCOSEU NEGATIVE 01/09/2021 0048   HGBUR MODERATE (A) 01/09/2021 0048   BILIRUBINUR NEGATIVE 01/09/2021 0048   KETONESUR 20 (A) 01/09/2021 0048   PROTEINUR >=300 (A) 01/09/2021 0048   NITRITE NEGATIVE 01/09/2021 0048   LEUKOCYTESUR NEGATIVE 01/09/2021 0048    Sepsis Labs: Lactic Acid, Venous    Component Value Date/Time   LATICACIDVEN 1.2 01/08/2021 2253    MICROBIOLOGY: Recent Results (from the past 240 hour(s))  Blood culture (routine x 2)     Status: None   Collection Time: 01/08/21 11:29 PM   Specimen: BLOOD  Result Value Ref Range Status   Specimen Description   Final    BLOOD BOTTLES DRAWN AEROBIC ONLY Performed at [facility], [address], [place], [address] [place]    Special Requests   Final    Blood Culture results may not be optimal due to an inadequate volume of blood received  in culture bottles BLOOD RIGHT HAND Performed at [facility], [address], [place], [place] [address]    Culture   Final    NO GROWTH 5 DAYS Performed at [facility], [address] [place], [place] [address]    Report Status 01/14/2021 FINAL   Final  Blood culture (routine x 2)     Status: None   Collection Time: 01/08/21 11:42 PM   Specimen: BLOOD  Result Value Ref Range Status   Specimen Description   Final    BLOOD BOTTLES DRAWN AEROBIC AND ANAEROBIC Performed at [facility], [address], [place], [place] [address]    Special Requests   Final    Blood Culture adequate volume BLOOD LEFT ARM Performed at [facility], [address] [place], [place] [address]    Culture   Final    NO GROWTH 5 DAYS Performed at [facility], [address], [place], [place] [address]    Report Status 01/14/2021 FINAL  Final  Resp Panel by RT-PCR (Flu A&B, Covid) Peripheral     Status: None   Collection Time: 01/08/21 11:43 PM   Specimen: Peripheral; Nasopharyngeal(NP) swabs in vial transport medium  Result Value Ref Range Status   SARS Coronavirus 2 by RT PCR NEGATIVE NEGATIVE Final    Comment: (NOTE) SARS-CoV-2 target nucleic acids are NOT DETECTED.  The SARS-CoV-2 RNA is generally detectable in upper respiratory specimens during the acute phase of infection. The lowest concentration of SARS-CoV-2 viral copies this assay can detect is 138 copies/mL. A negative result does not preclude SARS-Cov-2 infection and should not be used as the sole basis for treatment or other patient management decisions. A negative result may occur with  improper specimen collection/handling, submission of specimen other than nasopharyngeal swab, presence of viral mutation(s) within the areas targeted by this assay, and inadequate number of viral copies(<138 copies/mL). A negative result must be combined with clinical observations, patient history, and epidemiological information. The expected result is Negative.  Fact Sheet for Patients:  BloggerCourse.com  Fact Sheet for Healthcare Providers:  SeriousBroker.it  This test is no t yet approved or  cleared by the [place] FDA and  has been authorized for detection and/or diagnosis of SARS-CoV-2 by FDA under an Emergency Use Authorization (EUA). This EUA will remain  in effect (meaning this test can be used) for the duration of the COVID-19 declaration under Section 564(b)(1) of the Act, 21 U.S.C.section 360bbb-3(b)(1), unless the authorization is terminated  or revoked sooner.       Influenza A by PCR NEGATIVE NEGATIVE Final   Influenza B by PCR NEGATIVE NEGATIVE Final    Comment: (NOTE) The Xpert Xpress SARS-CoV-2/FLU/RSV plus assay is intended as an aid in the diagnosis of influenza from Nasopharyngeal swab specimens and should not be used as a sole basis for treatment. Nasal washings and aspirates are unacceptable for Xpert Xpress SARS-CoV-2/FLU/RSV testing.  Fact Sheet for Patients: BloggerCourse.com  Fact Sheet for Healthcare Providers: SeriousBroker.it  This test is not yet approved or cleared by the [place] FDA and has been authorized for detection and/or diagnosis of SARS-CoV-2 by FDA under an Emergency Use Authorization (EUA). This EUA will remain in effect (meaning this test can be used) for the duration of the COVID-19 declaration under Section 564(b)(1) of the Act, 21 U.S.C. section 360bbb-3(b)(1), unless the authorization is terminated or revoked.  Performed at [facility], [address], [place], [place] [address]  Urine Culture     Status: Abnormal   Collection Time: 01/09/21 12:48 AM   Specimen: Urine, Clean Catch  Result Value Ref Range Status   Specimen Description URINE, CLEAN CATCH  Final   Special Requests   Final    NONE Performed at [facility], [address], [place], [place] [address]    Culture MULTIPLE SPECIES PRESENT, SUGGEST RECOLLECTION (A)  Final   Report Status 01/10/2021 FINAL  Final  MRSA Next Gen by PCR, Nasal     Status: None   Collection  Time: 01/09/21  1:11 PM   Specimen: Nasal Mucosa; Nasal Swab  Result Value Ref Range Status   MRSA by PCR Next Gen NOT DETECTED NOT DETECTED Final    Comment: (NOTE) The GeneXpert MRSA Assay (FDA approved for NASAL specimens only), is one component of a comprehensive MRSA colonization surveillance program. It is not intended to diagnose MRSA infection nor to guide or monitor treatment for MRSA infections. Test performance is not FDA approved in patients less than 37 years old. Performed at [facility], [address], [place], [place] [address]     RADIOLOGY STUDIES/RESULTS: No results found.   LOS: 5 days   Jeoffrey Massed, MD  [facility]    To contact the attending provider between 7A-7P or the covering provider during after hours 7P-7A, please log into the web site [email or web address] and access using universal [place] password for that web site. If you do not have the password, please call the hospital operator.  01/14/2021, 4:00 PM

## 2021-01-15 LAB — RESP PANEL BY RT-PCR (FLU A&B, COVID) ARPGX2
Influenza A by PCR: NEGATIVE
Influenza B by PCR: NEGATIVE
SARS Coronavirus 2 by RT PCR: NEGATIVE

## 2021-01-15 MED ORDER — ACETAMINOPHEN 325 MG PO TABS
650.0000 mg | ORAL_TABLET | Freq: Four times a day (QID) | ORAL | Status: DC | PRN
Start: 2021-01-15 — End: 2022-12-22

## 2021-01-15 MED ORDER — GABAPENTIN 100 MG PO CAPS: 200.0000 mg | ORAL_CAPSULE | Freq: Three times a day (TID) | ORAL | Status: DC

## 2021-01-15 MED ORDER — KLOR-CON M20 20 MEQ PO TBCR: 40.0000 meq | EXTENDED_RELEASE_TABLET | Freq: Every day | ORAL | Status: DC

## 2021-01-15 MED ORDER — TRAMADOL HCL 50 MG PO TABS: 50.0000 mg | ORAL_TABLET | Freq: Three times a day (TID) | ORAL | 0 refills | Status: DC | PRN

## 2021-01-15 MED ORDER — METOLAZONE 5 MG PO TABS: 5.0000 mg | ORAL_TABLET | ORAL | Status: DC

## 2021-01-15 MED ORDER — METOLAZONE 5 MG PO TABS: 5.0000 mg | ORAL_TABLET | ORAL | 1 refills | Status: DC

## 2021-01-15 MED ORDER — ENOXAPARIN SODIUM 60 MG/0.6ML IJ SOSY
60.0000 mg | PREFILLED_SYRINGE | INTRAMUSCULAR | Status: DC
  Administered 2021-01-15: 60 mg via SUBCUTANEOUS
  Filled 2021-01-15: qty 0.6

## 2021-01-15 NOTE — TOC Progression Note (Addendum)
Transition of Care [facility]) - Progression Note    Patient Details  Name: [name] MRN: [id number] Date of Birth: 02/21/1951  [facility]) CM/SW Contact  Mearl Latin, LCSW Phone Number: 01/15/2021, 9:08 AM  Clinical Narrative:    Insurance approval received for [facility]: #[id number], effective 01/15/2021-01/17/2021.  Requested rapid covid test from MD. CSW updated patient's spouse and he is in agreement with going to [facility] at 1pm today to sign admission paperwork. CSW will arrange PTAR after that.    Expected Discharge Plan: Skilled Nursing Facility Barriers to Discharge: Continued Medical Work up, SNF Pending bed offer, English as a second language teacher  Expected Discharge Plan and Services Expected Discharge Plan: Skilled Nursing Facility In-house Referral: Clinical Social Work   Post Acute Care Choice: Skilled Nursing Facility Living arrangements for the past 2 months: Single Family Home                                       Social Determinants of Health (SDOH) Interventions    Readmission Risk Interventions No flowsheet data found.

## 2021-01-15 NOTE — Progress Notes (Signed)
Report called to Nicholos Johns, receiving nurse at [facility]. All questions/concerns answered at this time. Will continue to monitor.   Lyndal Pulley, RN 01/15/2021 2:34 PM

## 2021-01-15 NOTE — Progress Notes (Signed)
Occupational Therapy Treatment Patient Details Name: [name] MRN: [id number] DOB: 1951-02-23 Today's Date: 01/15/2021    History of present illness Pt is 70 y.o. female presenting with confusion and admitted with sepsis secondary to R LE cellulitis on 01/08/21.  Pt with history of sleep apnea, seizures, hypertension, chronic lymphedema of the lower extremity, hyperlipidemia who has had prior history of craniotomy for subarachnoid hemorrhage with aneurysmal clipping.   OT comments  Treatment focused on functional mobility needed for out of bed ADLs. Patient max assist to transfer to side of bed and min-mod assist x 2 to stand with RW and take steps to head of bed. Patient limited by pain in RLE (hip and lower leg). Max x 2 to return to supine. Patient found on 2 L [place] and o2 sat dropped to 86% on RA and had to be replaced. Continue to recommend short term rehab at discharge.   Follow Up Recommendations  SNF    Equipment Recommendations  Other (comment) (defer)    Recommendations for Other Services      Precautions / Restrictions Precautions Precautions: Fall Precaution Comments: pain in RLE Restrictions Weight Bearing Restrictions: No       Mobility Bed Mobility Overal bed mobility: Needs Assistance Bed Mobility: Supine to Sit;Sit to Supine     Supine to sit: Max assist;HOB elevated Sit to supine: +2 for physical assistance;Max assist   General bed mobility comments: Required high HOB and max assist from therapist to transfer to side of bed with heavy assist with rails. Patient limited by pain and body habitus. Patient requiired +2 to return to supine (patient's husband assisted therapist).    Transfers Overall transfer level: Needs assistance Equipment used: Rolling walker (2 wheeled) Transfers: Sit to/from Stand Sit to Stand: From elevated surface;Min assist;+2 physical assistance;+2 safety/equipment;Mod assist         General transfer comment: Stood patient  twice. From elevated bed height (nearing high perch) patient min assist x 2. From lower bed height patient mod x 2 to stand. Patient abel to take steps to head of bed (taking two attempts) with RW. Patient exhibited difficulty with weight shifting. Her husband reports right hip pain/difficulty at baseline.    Balance Overall balance assessment: Needs assistance Sitting-balance support: Bilateral upper extremity supported Sitting balance-Leahy Scale: Poor     Standing balance support: Bilateral upper extremity supported Standing balance-Leahy Scale: Poor                             ADL either performed or assessed with clinical judgement   ADL                                               Vision Baseline Vision/History: 0 No visual deficits     Perception     Praxis      Cognition Arousal/Alertness: Awake/alert Behavior During Therapy: WFL for tasks assessed/performed Overall Cognitive Status: Impaired/Different from baseline Area of Impairment: Safety/judgement;Awareness                     Memory: Decreased short-term memory Following Commands: Follows one step commands consistently Safety/Judgement: Decreased awareness of deficits;Decreased awareness of safety Awareness: Emergent Problem Solving: Slow processing;Requires verbal cues;Decreased initiation;Difficulty sequencing;Requires tactile cues          Exercises  Shoulder Instructions       General Comments      Pertinent Vitals/ Pain       Pain Assessment: Faces Faces Pain Scale: Hurts whole lot Pain Location: R hip, leg Pain Descriptors / Indicators: Sore;Moaning;Guarding;Grimacing Pain Intervention(s): Limited activity within patient's tolerance;Premedicated before session  Home Living                                          Prior Functioning/Environment              Frequency  Min 2X/week        Progress Toward Goals  OT  Goals(current goals can now be found in the care plan section)  Progress towards OT goals: Progressing toward goals  Acute Rehab OT Goals Patient Stated Goal: walk and sit in chair OT Goal Formulation: With patient Time For Goal Achievement: 01/23/21 Potential to Achieve Goals: Good  Plan Discharge plan remains appropriate    Co-evaluation          OT goals addressed during session: Other (comment) (functional mobility)      AM-PAC OT "6 Clicks" Daily Activity     Outcome Measure   Help from another person eating meals?: None Help from another person taking care of personal grooming?: A Little Help from another person toileting, which includes using toliet, bedpan, or urinal?: Total Help from another person bathing (including washing, rinsing, drying)?: A Lot Help from another person to put on and taking off regular upper body clothing?: A Little Help from another person to put on and taking off regular lower body clothing?: A Lot 6 Click Score: 15    End of Session Equipment Utilized During Treatment: Rolling walker;Oxygen  OT Visit Diagnosis: Unsteadiness on feet (R26.81);Muscle weakness (generalized) (M62.81);Other symptoms and signs involving cognitive function   Activity Tolerance Patient limited by pain;Patient limited by fatigue   Patient Left in bed;with call bell/phone within reach;with family/visitor present;with bed alarm set   Nurse Communication Other (comment) (okay to see)        Time: 8185-6314 OT Time Calculation (min): 22 min  Charges: OT General Charges $OT Visit: 1 Visit OT Treatments $Therapeutic Activity: 8-22 mins  Waldron Session, OTR/L Acute Care Rehab Services  Office [phone] Pager: [phone]    Kelli Churn 01/15/2021, 12:10 PM

## 2021-01-15 NOTE — Discharge Summary (Signed)
PATIENT DETAILS Name: [name] Age: 70 y.o. Sex: female Date of Birth: May 20, 1950 MRN: [id number]. Admitting Physician: Eduard Clos, MD [id number], Jasmine December, MD  Admit Date: 01/08/2021 Discharge date: 01/15/2021  Recommendations for Outpatient Follow-up:  Follow up with PCP in 1-2 weeks Please obtain CMP/CBC in one week Please follow up on the following pending results:  Admitted From:  Home  Disposition: SNF   Home Health: No  Equipment/Devices: None  Discharge Condition: Stable  CODE STATUS: FULL CODE  Diet recommendation:  Diet Order             Diet - low sodium heart healthy           Diet Heart Room service appropriate? Yes with Assist; Fluid consistency: Thin; Fluid restriction: 1200 mL Fluid  Diet effective now                    Brief Narrative: Patient is a 70 y.o. female with history of chronic bilateral lower extremity lymphedema, seizures, OSA (noncompliant to CPAP) HLD, SAH-s/p craniotomy and aneurysm clipping-presented with confusion-and sepsis physiology secondary to RLE cellulitis.   Significant events: 8/31>> fever-confusion-RLE cellulitis-admit to [facility].   Significant studies: 8/31>> CXR: No pneumonia. 8/31>> Doppler bilateral lower extremity: No DVT 8/31>> CT head: No acute intracranial abnormality. 9/01>> CT angio head/neck: No acute abnormality-no change from CTA done in 2014 (fusiform aneurysm of the supraclinoid ICA on right-s/p stenting, aneurysmal disease dilatation distal to the stent, aneurysmal clip at the right MCA bifurcation, right PCA origin). 9/01>> x-ray right tibia/fibula: No acute finding. 9/01>> x-ray right foot: No acute finding. 9/01>> EEG: No seizures   Antimicrobial therapy: Vancomycin: 8/31>>9/4 Cefepime: 8/31>>9/1 Doxy: 9/4>>9/7   Microbiology data: 8/31>> influenza/COVID PCR: 8/31>> blood culture: no growth 9/01>> urine culture: no growth   Procedures : None    Consults: Neurology  Brief Hospital Course: Sepsis due to RLE cellulitis: Sepsis physiology has resolved-improvement in RLE erythema (has lymphedema at baseline).  Initially on IV vancomycin-subsequently transitioned to doxycycline-and has completed a course of antimicrobial therapy.   Acute metabolic encephalopathy: Likely due to sepsis-has improved-suspect patient not far from baseline.  Neuroimaging was negative for any abnormalities-EEG without seizures.  Neurology following with recommendations to continue supportive care.   Seizure disorder: EEG negative-continue phenobarbital.   HTN: BP stable-continue ARB/amlodipine   HLD: Continue statin   HFpEF/bilateral lower extremity lymphedema: Volume status stable-unchanged-continue Aldactone/metolazone/Demadex.  She was previously noncompliant to compression therapy in the past-however she changed her mind-and hence her legs have now been wrapped.  However she is now complaining of more pain in her legs-and wants the compression wraps removed.  Chronic pain syndrome: Has chronic pain in her legs/back-and is on tramadol in the outpatient setting.  Due to worsening pain in her legs from compression wraps-she is on Percocet for the past few days-she appears slightly drowsy but easily arousable.  Plan is to remove the wraps-and place her back on her usual dosing of tramadol.   History of rosacea: On doxycycline   OSA: CPAP at night-but has been noncompliant lately   Morbid Obesity: Estimated body mass index is 54.87 kg/m as calculated from the following:   Height as of this encounter: 5\' 2"  (1.575 m).   Weight as of this encounter: 136.1 kg.   RN pressure injury documentation: Pressure Injury 01/09/21 Buttocks Right Stage 2 -  Partial thickness loss of dermis presenting as a shallow open injury with a red, pink wound bed without slough. (Active)  01/09/21 1300  Location: Buttocks  Location Orientation: Right  Staging: Stage 2 -  Partial  thickness loss of dermis presenting as a shallow open injury with a red, pink wound bed without slough.  Wound Description (Comments):   Present on Admission: Yes    Procedures None  Discharge Diagnoses:  Active Problems:   Restless legs syndrome (RLS)   Seizure disorder (HCC)   Essential hypertension   Lymphedema of right lower extremity   Cellulitis   SIRS (systemic inflammatory response syndrome) (HCC)   Acute encephalopathy   Discharge Instructions:  Activity:  As tolerated with Full fall precautions use walker/cane & assistance as needed  Discharge Instructions     Diet - low sodium heart healthy   Complete by: As directed    Discharge instructions   Complete by: As directed    Follow with Primary MD  [name], MD in 1-2 weeks  Please get a complete blood count and chemistry panel checked by your Primary MD at your next visit, and again as instructed by your Primary MD.  Get Medicines reviewed and adjusted: Please take all your medications with you for your next visit with your Primary MD  Laboratory/radiological data: Please request your Primary MD to go over all hospital tests and procedure/radiological results at the follow up, please ask your Primary MD to get all Hospital records sent to his/her office.  In some cases, they will be blood work, cultures and biopsy results pending at the time of your discharge. Please request that your primary care M.D. follows up on these results.  Also Note the following: If you experience worsening of your admission symptoms, develop shortness of breath, life threatening emergency, suicidal or homicidal thoughts you must seek medical attention immediately by calling 911 or calling your MD immediately  if symptoms less severe.  You must read complete instructions/literature along with all the possible adverse reactions/side effects for all the Medicines you take and that have been prescribed to you. Take any new Medicines  after you have completely understood and accpet all the possible adverse reactions/side effects.   Do not drive when taking Pain medications or sleeping medications (Benzodaizepines)  Do not take more than prescribed Pain, Sleep and Anxiety Medications. It is not advisable to combine anxiety,sleep and pain medications without talking with your primary care practitioner  Special Instructions: If you have smoked or chewed Tobacco  in the last 2 yrs please stop smoking, stop any regular Alcohol  and or any Recreational drug use.  Wear Seat belts while driving.  Please note: You were cared for by a hospitalist during your hospital stay. Once you are discharged, your primary care physician will handle any further medical issues. Please note that NO REFILLS for any discharge medications will be authorized once you are discharged, as it is imperative that you return to your primary care physician (or establish a relationship with a primary care physician if you do not have one) for your post hospital discharge needs so that they can reassess your need for medications and monitor your lab values.   Increase activity slowly   Complete by: As directed    No dressing needed   Complete by: As directed       Allergies as of 01/15/2021       Reactions   Compazine Shortness Of Breath   Prochlorperazine Maleate Shortness Of Breath   Topamax [topiramate] Hives, Rash   Codeine Sulfate Nausea Only   Seasonal Ic [cholestatin] Hives  Adhesive [tape] Rash   Iodinated Diagnostic Agents Rash   Uncoded Allergy. Allergen: contrast dyes        Medication List     STOP taking these medications    cephALEXin 500 MG capsule Commonly known as: KEFLEX   Gabapentin Enacarbil 600 MG Tbcr   predniSONE 50 MG tablet Commonly known as: DELTASONE   sulfamethoxazole-trimethoprim 800-160 MG tablet Commonly known as: BACTRIM DS       TAKE these medications    acetaminophen 325 MG tablet Commonly known  as: TYLENOL Take 2 tablets (650 mg total) by mouth every 6 (six) hours as needed for mild pain or headache. What changed:  medication strength how much to take when to take this reasons to take this   albuterol 108 (90 Base) MCG/ACT inhaler Commonly known as: VENTOLIN HFA Inhale 2 puffs into the lungs every 4 (four) hours as needed for wheezing.   amLODipine 5 MG tablet Commonly known as: NORVASC Take 1 tablet (5 mg total) by mouth daily. Keep upcoming appointment   aspirin EC 81 MG tablet Take 1 tablet (81 mg total) by mouth 2 (two) times daily.   Biotin 5000 MCG Caps Take 5,000 mcg by mouth 2 (two) times daily.   CALCIUM 600+D PO Take 1 tablet by mouth daily.   cholecalciferol 1000 units tablet Commonly known as: VITAMIN D Take 3,000 Units by mouth daily.   doxycycline 40 MG capsule Commonly known as: ORACEA Take 40 mg by mouth every morning. For rosacea   ferrous sulfate 325 (65 FE) MG tablet Take 650 mg by mouth daily with breakfast.   fluticasone 50 MCG/ACT nasal spray Commonly known as: FLONASE Place 2 sprays into the nose 2 (two) times daily.   gabapentin 100 MG capsule Commonly known as: NEURONTIN Take 2 capsules (200 mg total) by mouth 3 (three) times daily.   hydrocortisone 1 % ointment Apply 1 application topically 2 (two) times daily as needed for itching (rash on hands).   hydrOXYzine 25 MG tablet Commonly known as: ATARAX/VISTARIL Take 25 mg by mouth daily.   Klor-Con M20 20 MEQ tablet Generic drug: potassium chloride SA Take 2 tablets (40 mEq total) by mouth daily. What changed:  how much to take when to take this   levocetirizine 5 MG tablet Commonly known as: XYZAL Take 5 mg by mouth at bedtime.   magnesium oxide 400 MG tablet Commonly known as: MAG-OX Take 1,200 mg by mouth at bedtime.   metolazone 5 MG tablet Commonly known as: ZAROXOLYN Take 1 tablet (5 mg total) by mouth 3 (three) times a week. What changed: when to take this    montelukast 10 MG tablet Commonly known as: SINGULAIR Take 10 mg by mouth at bedtime.   nabumetone 750 MG tablet Commonly known as: RELAFEN Take 750 mg by mouth 2 (two) times daily with a meal.   pantoprazole 40 MG tablet Commonly known as: PROTONIX Take 40 mg by mouth at bedtime.   PHENobarbital 64.8 MG tablet Commonly known as: LUMINAL Take 2 tablets (129.6 mg total) by mouth at bedtime.   Pramipexole Dihydrochloride 0.75 MG Tb24 Take 1 tablet (0.75 mg total) by mouth at bedtime. What changed: Another medication with the same name was changed. Make sure you understand how and when to take each.   pramipexole 0.25 MG tablet Commonly known as: MIRAPEX TAKE 1-2 TABS AT 6 PM FOR RESTLESS LEG SYNDROME What changed:  how much to take how to take this when to take  this additional instructions   PRESCRIPTION MEDICATION Inhale into the lungs at bedtime. CPAP   rosuvastatin 20 MG tablet Commonly known as: CRESTOR TAKE 1 TABLET (20 MG TOTAL) BY MOUTH DAILY. NEEDS APPOINTMENT FOR FUTURE REFILLS What changed: additional instructions   spironolactone 50 MG tablet Commonly known as: ALDACTONE TAKE 1 TABLET BY MOUTH EVERY DAY   torsemide 20 MG tablet Commonly known as: DEMADEX Take 20 mg by mouth daily.   traMADol 50 MG tablet Commonly known as: ULTRAM Take 1 tablet (50 mg total) by mouth 3 (three) times daily as needed for moderate pain.   valsartan 80 MG tablet Commonly known as: DIOVAN Take 80 mg by mouth 2 (two) times daily.   VITAMIN A PO Take 1 capsule by mouth daily.   zinc gluconate 50 MG tablet Take 50 mg by mouth daily.               Discharge Care Instructions  (From admission, onward)           Start     Ordered   01/15/21 0000  No dressing needed        01/15/21 1042            Allergies  Allergen Reactions   Compazine Shortness Of Breath   Prochlorperazine Maleate Shortness Of Breath   Topamax [Topiramate] Hives and Rash    Codeine Sulfate Nausea Only   Seasonal Ic [Cholestatin] Hives   Adhesive [Tape] Rash   Iodinated Diagnostic Agents Rash    Uncoded Allergy. Allergen: contrast dyes      Consultations: Neurology   Other Procedures/Studies: CT Angio Head W or Wo Contrast  Result Date: 01/09/2021 CLINICAL DATA:  Acute neuro deficit. Headache and aphasia 5 days. History of cerebral aneurysm clipping EXAM: CT ANGIOGRAPHY HEAD AND NECK TECHNIQUE: Multidetector CT imaging of the head and neck was performed using the standard protocol during bolus administration of intravenous contrast. Multiplanar CT image reconstructions and MIPs were obtained to evaluate the vascular anatomy. Carotid stenosis measurements (when applicable) are obtained utilizing NASCET criteria, using the distal internal carotid diameter as the denominator. CONTRAST:  75mL OMNIPAQUE IOHEXOL 350 MG/ML SOLN COMPARISON:  CT angio head 01/27/2013.  CT head 01/08/2021 FINDINGS: CTA NECK FINDINGS Aortic arch: Mild atherosclerotic disease aortic arch and proximal great vessels. Proximal great vessels widely patent. Right carotid system: Mild atherosclerotic disease right carotid bifurcation. No significant carotid stenosis on the right. Left carotid system: Mild atherosclerotic calcification left common carotid artery and left carotid bifurcation without significant stenosis. Vertebral arteries: Both vertebral arteries patent to the skull base without stenosis. Skeleton: Cervical spondylosis, mild-to-moderate. No acute skeletal abnormality. Other neck: Negative for mass or adenopathy in the neck. Upper chest: Lung apices clear bilaterally. Review of the MIP images confirms the above findings CTA HEAD FINDINGS Anterior circulation: Bilateral pterional craniotomy for aneurysm clipping bilaterally. In addition, there is a stent in the right cavernous carotid extending into the right M1 segment. Stent and clips unchanged from the prior CTA 2014. Right carotid artery  stent is patent. There is atherosclerotic calcification in the wall of the supraclinoid internal carotid artery with fusiform aneurysmal dilatation near the stent. This measures up to 8.8 mm in diameter, unchanged from the prior study. There is contrast external to the stent in the aneurysm sac, unchanged from the prior CTA. There is fusiform aneurysmal dilatation of the distal right M1 segment at the bifurcation, unchanged from the prior CTA 2014. There is adjacent aneurysm clip. Aneurysm clip  also present in the region of the right posterior communicating artery origin without residual aneurysm. Right anterior cerebral artery widely patent. Left anterior cerebral artery widely patent. Aneurysm clip in the region of the terminal left internal carotid artery without residual aneurysm. No change from prior study. Left middle cerebral artery patent without aneurysm or stenosis. Posterior circulation: Widely patent posterior circulation. No aneurysm. Venous sinuses: Normal venous enhancement. Anatomic variants: None Review of the MIP images confirms the above findings IMPRESSION: 1. No acute abnormality and no change from CTA 2014. 2. Fusiform aneurysm of the supraclinoid internal carotid artery on the right, unchanged from the prior study. There has been stenting of this aneurysm. There remains contrast outside of the stent unchanged from 2014. Aneurysmal dilatation distal to the stent also unchanged from the prior study. Aneurysm clip right MCA bifurcation and right PCA origin without change from the prior study. 3. Aneurysm clip left carotid terminus without recurrent aneurysm, unchanged from the prior study. Electronically Signed   By: Marlan Palau M.D.   On: 01/09/2021 08:17   DG Tibia/Fibula Right  Result Date: 01/09/2021 CLINICAL DATA:  Leg swelling EXAM: RIGHT TIBIA AND FIBULA - 2 VIEW COMPARISON:  None. FINDINGS: Extensive subcutaneous reticulation and lobulated skin thickening. No evidence of fracture or  subluxation. Possible tibiotalar arthrodesis. Knee osteoarthritis with joint space narrowing and spurring. IMPRESSION: No acute finding. Electronically Signed   By: Marnee Spring M.D.   On: 01/09/2021 07:03   CT HEAD WO CONTRAST ( )  Result Date: 01/08/2021 CLINICAL DATA:  Altered mental status EXAM: CT HEAD WITHOUT CONTRAST TECHNIQUE: Contiguous axial images were obtained from the base of the skull through the vertex without intravenous contrast. COMPARISON:  07/02/2015 FINDINGS: Brain: No evidence of acute infarction, hemorrhage, hydrocephalus, extra-axial collection or mass lesion/mass effect. Encephalomalacic changes in the right parietal lobe (series 2/image 24), new from remote prior, but chronic. Encephalomalacic changes in the bilateral anterior temporal lobes, right greater than left, chronic. Subcortical white matter and periventricular small vessel ischemic changes. Vascular: Bilateral ICA and right M1 aneurysm clips with right ICA stent. Skull: Normal. Negative for fracture or focal lesion. Sinuses/Orbits: The visualized paranasal sinuses are essentially clear. The mastoid air cells are unopacified. Other: None. IMPRESSION: No evidence of acute intracranial abnormality. Encephalomalacic changes, as above. Right ICA stent with bilateral aneurysm clips. Small vessel ischemic changes. Electronically Signed   By: Charline Bills M.D.   On: 01/08/2021 22:53   CT ANGIO NECK W OR WO CONTRAST  Result Date: 01/09/2021 CLINICAL DATA:  Acute neuro deficit. Headache and aphasia 5 days. History of cerebral aneurysm clipping EXAM: CT ANGIOGRAPHY HEAD AND NECK TECHNIQUE: Multidetector CT imaging of the head and neck was performed using the standard protocol during bolus administration of intravenous contrast. Multiplanar CT image reconstructions and MIPs were obtained to evaluate the vascular anatomy. Carotid stenosis measurements (when applicable) are obtained utilizing NASCET criteria, using the distal  internal carotid diameter as the denominator. CONTRAST:  75mL OMNIPAQUE IOHEXOL 350 MG/ML SOLN COMPARISON:  CT angio head 01/27/2013.  CT head 01/08/2021 FINDINGS: CTA NECK FINDINGS Aortic arch: Mild atherosclerotic disease aortic arch and proximal great vessels. Proximal great vessels widely patent. Right carotid system: Mild atherosclerotic disease right carotid bifurcation. No significant carotid stenosis on the right. Left carotid system: Mild atherosclerotic calcification left common carotid artery and left carotid bifurcation without significant stenosis. Vertebral arteries: Both vertebral arteries patent to the skull base without stenosis. Skeleton: Cervical spondylosis, mild-to-moderate. No acute skeletal abnormality. Other neck:  Negative for mass or adenopathy in the neck. Upper chest: Lung apices clear bilaterally. Review of the MIP images confirms the above findings CTA HEAD FINDINGS Anterior circulation: Bilateral pterional craniotomy for aneurysm clipping bilaterally. In addition, there is a stent in the right cavernous carotid extending into the right M1 segment. Stent and clips unchanged from the prior CTA 2014. Right carotid artery stent is patent. There is atherosclerotic calcification in the wall of the supraclinoid internal carotid artery with fusiform aneurysmal dilatation near the stent. This measures up to 8.8 mm in diameter, unchanged from the prior study. There is contrast external to the stent in the aneurysm sac, unchanged from the prior CTA. There is fusiform aneurysmal dilatation of the distal right M1 segment at the bifurcation, unchanged from the prior CTA 2014. There is adjacent aneurysm clip. Aneurysm clip also present in the region of the right posterior communicating artery origin without residual aneurysm. Right anterior cerebral artery widely patent. Left anterior cerebral artery widely patent. Aneurysm clip in the region of the terminal left internal carotid artery without  residual aneurysm. No change from prior study. Left middle cerebral artery patent without aneurysm or stenosis. Posterior circulation: Widely patent posterior circulation. No aneurysm. Venous sinuses: Normal venous enhancement. Anatomic variants: None Review of the MIP images confirms the above findings IMPRESSION: 1. No acute abnormality and no change from CTA 2014. 2. Fusiform aneurysm of the supraclinoid internal carotid artery on the right, unchanged from the prior study. There has been stenting of this aneurysm. There remains contrast outside of the stent unchanged from 2014. Aneurysmal dilatation distal to the stent also unchanged from the prior study. Aneurysm clip right MCA bifurcation and right PCA origin without change from the prior study. 3. Aneurysm clip left carotid terminus without recurrent aneurysm, unchanged from the prior study. Electronically Signed   By: Marlan Palau M.D.   On: 01/09/2021 08:17   US Venous Img Lower Bilateral  Result Date: 01/08/2021 CLINICAL DATA:  Right lower extremity edema. EXAM: BILATERAL LOWER EXTREMITY VENOUS DOPPLER ULTRASOUND TECHNIQUE: Gray-scale sonography with compression, as well as color and duplex ultrasound, were performed to evaluate the deep venous system(s) from the level of the common femoral vein through the popliteal and proximal calf veins. COMPARISON:  None. FINDINGS: VENOUS Normal compressibility of the common femoral, superficial femoral, and popliteal veins, as well as the visualized calf veins. Visualized portions of profunda femoral vein and great saphenous vein unremarkable. No filling defects to suggest DVT on grayscale or color Doppler imaging. Doppler waveforms show normal direction of venous flow, normal respiratory plasticity and response to augmentation. Limited views of the contralateral common femoral vein are unremarkable. OTHER A 3.3 cm x 0.7 cm x 2.1 cm right groin lymph node is noted. Limitations: Limited study secondary to the  patient's body habitus and limited patient movement. IMPRESSION: No evidence of DVT within the RIGHT or LEFT lower extremity. Electronically Signed   By: Aram Candela M.D.   On: 01/08/2021 21:02   DG Chest Portable 1 View  Result Date: 01/08/2021 CLINICAL DATA:  Aphasia, evaluate for occult infection EXAM: PORTABLE CHEST 1 VIEW COMPARISON:  12/19/2018 FINDINGS: Lungs are clear.  No pleural effusion or pneumothorax. The heart is top-normal in size. IMPRESSION: No evidence of acute cardiopulmonary disease. Electronically Signed   By: Charline Bills M.D.   On: 01/08/2021 22:54   DG Foot 2 Views Right  Result Date: 01/09/2021 CLINICAL DATA:  Leg swelling and restlessness with no known injury EXAM: RIGHT  FOOT - 2 VIEW COMPARISON:  None. FINDINGS: Extensive lobulation and thickening of the skin, often stasis related. Postoperative second and third metatarsal necks and postoperative or posttraumatic deformity of the first metatarsal. Remote ankle fracture fixation with probable tibiotalar arthrodesis. Osteopenia is pronounced. No acute fracture or subluxation. IMPRESSION: 1. No acute finding. 2. Chronic findings are described above. Electronically Signed   By: Marnee Spring M.D.   On: 01/09/2021 07:02   EEG adult  Result Date: 01/09/2021 Charlsie Quest, MD     01/09/2021 10:16 AM Patient Name: [name] MRN: [id number] Epilepsy Attending: Charlsie Quest Referring Physician/Provider: Dr Erick Blinks Date: 01/09/2021 Duration: 22.33 mins Patient history: 70 year old female with history of seizures on phenobarbital, remote aneurysmal subarachnoid hemorrhage status post clipping and stenting of multiple aneurysms and chronic bitemporal encephalomalacia and right parietal stroke who presented with aphasia and encephalopathy.  EEG to evaluate for seizures. Level of alertness: Awake, asleep AEDs during EEG study: GBP, phenobarb Technical aspects: This EEG study was done with scalp electrodes positioned  according to the 10-20 International system of electrode placement. Electrical activity was acquired at a sampling rate of 500Hz  and reviewed with a high frequency filter of 70Hz  and a low frequency filter of 1Hz . EEG data were recorded continuously and digitally stored. Description: The posterior dominant rhythm consists of 6 Hz activity of moderate voltage (25-35 uV) seen predominantly in posterior head regions, symmetric and reactive to eye opening and eye closing. Sleep was characterized by vertex waves, sleep spindles (12 to 14 Hz), maximal frontocentral region.  EEG showed continuous generalized 5 to 6 Hz theta as well as intermittent generalized 2 to 3 Hz delta slowing. Hyperventilation and photic stimulation were not performed.   ABNORMALITY - Continuous slow, generalized - Background slow IMPRESSION: This study is suggestive of moderate diffuse encephalopathy, nonspecific etiology. No seizures or epileptiform discharges were seen throughout the recording. Priyanka     TODAY-DAY OF DISCHARGE:  Subjective:   [name] today has no headache,no chest abdominal pain,no new weakness tingling or numbness, feels much better wants to go home today.   Objective:   Blood pressure 109/68, pulse 66, temperature 98.9 F (37.2 C), temperature source Oral, resp. rate (!) 23, height 5\' 2"  (1.575 m), weight 136.1 kg, SpO2 95 %.  Intake/Output Summary (Last 24 hours) at 01/15/2021 1042 Last data filed at 01/15/2021 0700 Gross per 24 hour  Intake --  Output 1100 ml  Net -1100 ml   Filed Weights   01/08/21 1838  Weight: 136.1 kg    Exam: Awake Alert, Oriented *3, No new F.N deficits, Normal affect [place].AT,PERRAL Supple Neck,No JVD, No cervical lymphadenopathy appriciated.  Symmetrical Chest wall movement, Good air movement bilaterally, CTAB RRR,No Gallops,Rubs or new Murmurs, No Parasternal Heave +ve B.Sounds, Abd Soft, Non tender, No organomegaly appriciated, No rebound -guarding or  rigidity. No Cyanosis, Clubbing or edema, No new Rash or bruise   PERTINENT RADIOLOGIC STUDIES: No results found.   PERTINENT LAB RESULTS: CBC: No results for input(s): WBC, HGB, HCT, PLT in the last 72 hours. CMET CMP     Component Value Date/Time   NA 133 (L) 01/12/2021 0015   NA 136 01/04/2019 0924   K 4.4 01/12/2021 0015   CL 92 (L) 01/12/2021 0015   CO2 31 01/12/2021 0015   GLUCOSE 110 (H) 01/12/2021 0015   BUN 23 01/12/2021 0015   BUN 36 (H) 01/04/2019 0924   CREATININE 1.20 (H) 01/12/2021 0015  CREATININE 0.75 01/01/2016 1404   CALCIUM 9.2 01/12/2021 0015   PROT 7.5 01/08/2021 2148   PROT 7.5 01/04/2019 0924   ALBUMIN 3.5 01/08/2021 2148   ALBUMIN 4.2 01/04/2019 0924   AST 24 01/08/2021 2148   ALT 16 01/08/2021 2148   ALKPHOS 83 01/08/2021 2148   BILITOT 1.4 (H) 01/08/2021 2148   BILITOT 0.3 01/04/2019 0924   GFRNONAA 49 (L) 01/12/2021 0015   GFRAA 73 01/04/2019 0924    GFR Estimated Creatinine Clearance: 59 mL/min (A) (by C-G formula based on SCr of 1.2 mg/dL (H)). No results for input(s): LIPASE, AMYLASE in the last 72 hours. No results for input(s): CKTOTAL, CKMB, CKMBINDEX, TROPONINI in the last 72 hours. Invalid input(s): POCBNP No results for input(s): DDIMER in the last 72 hours. No results for input(s): HGBA1C in the last 72 hours. No results for input(s): CHOL, HDL, LDLCALC, TRIG, CHOLHDL, LDLDIRECT in the last 72 hours. No results for input(s): TSH, T4TOTAL, T3FREE, THYROIDAB in the last 72 hours.  Invalid input(s): FREET3 No results for input(s): VITAMINB12, FOLATE, FERRITIN, TIBC, IRON, RETICCTPCT in the last 72 hours. Coags: No results for input(s): INR in the last 72 hours.  Invalid input(s): PT Microbiology: Recent Results (from the past 240 hour(s))  Blood culture (routine x 2)     Status: None   Collection Time: 01/08/21 11:29 PM   Specimen: BLOOD  Result Value Ref Range Status   Specimen Description   Final    BLOOD BOTTLES DRAWN  AEROBIC ONLY Performed at [facility], [address], [place], [place] [address]    Special Requests   Final    Blood Culture results may not be optimal due to an inadequate volume of blood received in culture bottles BLOOD RIGHT HAND Performed at [facility], [address] [place], [place] [address]    Culture   Final    NO GROWTH 5 DAYS Performed at [facility], [address], [place], [place] [address]    Report Status 01/14/2021 FINAL  Final  Blood culture (routine x 2)     Status: None   Collection Time: 01/08/21 11:42 PM   Specimen: BLOOD  Result Value Ref Range Status   Specimen Description   Final    BLOOD BOTTLES DRAWN AEROBIC AND ANAEROBIC Performed at [facility], [address] [place], [place] [address]    Special Requests   Final    Blood Culture adequate volume BLOOD LEFT ARM Performed at [facility], [address], [place], [place] [address]    Culture   Final    NO GROWTH 5 DAYS Performed at [facility], [address], [place], [place] [address]    Report Status 01/14/2021 FINAL  Final  Resp Panel by RT-PCR (Flu A&B, Covid) Peripheral     Status: None   Collection Time: 01/08/21 11:43 PM   Specimen: Peripheral; Nasopharyngeal(NP) swabs in vial transport medium  Result Value Ref Range Status   SARS Coronavirus 2 by RT PCR NEGATIVE NEGATIVE Final    Comment: (NOTE) SARS-CoV-2 target nucleic acids are NOT DETECTED.  The SARS-CoV-2 RNA is generally detectable in upper respiratory specimens during the acute phase of infection. The lowest concentration of SARS-CoV-2 viral copies this assay can detect is 138 copies/mL. A negative result does not preclude SARS-Cov-2 infection and should not be used as the sole basis for treatment or other patient management decisions. A negative result may occur with  improper specimen  collection/handling, submission of specimen  other than nasopharyngeal swab, presence of viral mutation(s) within the areas targeted by this assay, and inadequate number of viral copies(<138 copies/mL). A negative result must be combined with clinical observations, patient history, and epidemiological information. The expected result is Negative.  Fact Sheet for Patients:  BloggerCourse.com  Fact Sheet for Healthcare Providers:  SeriousBroker.it  This test is no t yet approved or cleared by the [place] FDA and  has been authorized for detection and/or diagnosis of SARS-CoV-2 by FDA under an Emergency Use Authorization (EUA). This EUA will remain  in effect (meaning this test can be used) for the duration of the COVID-19 declaration under Section 564(b)(1) of the Act, 21 U.S.C.section 360bbb-3(b)(1), unless the authorization is terminated  or revoked sooner.       Influenza A by PCR NEGATIVE NEGATIVE Final   Influenza B by PCR NEGATIVE NEGATIVE Final    Comment: (NOTE) The Xpert Xpress SARS-CoV-2/FLU/RSV plus assay is intended as an aid in the diagnosis of influenza from Nasopharyngeal swab specimens and should not be used as a sole basis for treatment. Nasal washings and aspirates are unacceptable for Xpert Xpress SARS-CoV-2/FLU/RSV testing.  Fact Sheet for Patients: BloggerCourse.com  Fact Sheet for Healthcare Providers: SeriousBroker.it  This test is not yet approved or cleared by the [place] FDA and has been authorized for detection and/or diagnosis of SARS-CoV-2 by FDA under an Emergency Use Authorization (EUA). This EUA will remain in effect (meaning this test can be used) for the duration of the COVID-19 declaration under Section 564(b)(1) of the Act, 21 U.S.C. section 360bbb-3(b)(1), unless the authorization is terminated or revoked.  Performed at [facility], [address], [place], [place] [address]   Urine Culture     Status: Abnormal   Collection Time: 01/09/21 12:48 AM   Specimen: Urine, Clean Catch  Result Value Ref Range Status   Specimen Description URINE, CLEAN CATCH  Final   Special Requests   Final    NONE Performed at [facility], [address] [place], [place] [address]    Culture MULTIPLE SPECIES PRESENT, SUGGEST RECOLLECTION (A)  Final   Report Status 01/10/2021 FINAL  Final  MRSA Next Gen by PCR, Nasal     Status: None   Collection Time: 01/09/21  1:11 PM   Specimen: Nasal Mucosa; Nasal Swab  Result Value Ref Range Status   MRSA by PCR Next Gen NOT DETECTED NOT DETECTED Final    Comment: (NOTE) The GeneXpert MRSA Assay (FDA approved for NASAL specimens only), is one component of a comprehensive MRSA colonization surveillance program. It is not intended to diagnose MRSA infection nor to guide or monitor treatment for MRSA infections. Test performance is not FDA approved in patients less than 42 years old. Performed at [facility], [address], [place], [place] [address]     FURTHER DISCHARGE INSTRUCTIONS:  Get Medicines reviewed and adjusted: Please take all your medications with you for your next visit with your Primary MD  Laboratory/radiological data: Please request your Primary MD to go over all hospital tests and procedure/radiological results at the follow up, please ask your Primary MD to get all Hospital records sent to his/her office.  In some cases, they will be blood work, cultures and biopsy results pending at the time of your discharge. Please request that your primary care M.D. goes through all the records of your hospital data and follows up on these results.  Also  Note the following: If you experience worsening of your admission symptoms, develop shortness of breath, life threatening emergency, suicidal or homicidal thoughts you must seek medical attention immediately by calling 911 or calling  your MD immediately  if symptoms less severe.  You must read complete instructions/literature along with all the possible adverse reactions/side effects for all the Medicines you take and that have been prescribed to you. Take any new Medicines after you have completely understood and accpet all the possible adverse reactions/side effects.   Do not drive when taking Pain medications or sleeping medications (Benzodaizepines)  Do not take more than prescribed Pain, Sleep and Anxiety Medications. It is not advisable to combine anxiety,sleep and pain medications without talking with your primary care practitioner  Special Instructions: If you have smoked or chewed Tobacco  in the last 2 yrs please stop smoking, stop any regular Alcohol  and or any Recreational drug use.  Wear Seat belts while driving.  Please note: You were cared for by a hospitalist during your hospital stay. Once you are discharged, your primary care physician will handle any further medical issues. Please note that NO REFILLS for any discharge medications will be authorized once you are discharged, as it is imperative that you return to your primary care physician (or establish a relationship with a primary care physician if you do not have one) for your post hospital discharge needs so that they can reassess your need for medications and monitor your lab values.  Total Time spent coordinating discharge including counseling, education and face to face time equals 35 minutes.  SignedJeoffrey Massed 01/15/2021 10:42 AM

## 2021-01-15 NOTE — Care Management Important Message (Signed)
Important Message  Patient Details  Name: [name] MRN: [id number] Date of Birth: [birth year]   Medicare Important Message Given:  Yes     [name] 01/15/2021, 8:36 AM

## 2021-01-15 NOTE — Progress Notes (Addendum)
VS were performed by Respiratory while starting patients CPAP thus firing a yellow MEWS. These vitals were not a complete set. Complete set of vitals were performed by [facility] staff at 0000 and result was GREEN MEWS.

## 2021-01-15 NOTE — TOC Transition Note (Signed)
Transition of Care [facility]) - CM/SW Discharge Note   Patient Details  Name: NOLYN EILERT MRN: [id number] Date of Birth: 1950/11/03  [facility]) CM/SW Contact:  Mearl Latin, LCSW Phone Number: 01/15/2021, 1:19 PM   Clinical Narrative:    Patient will DC to: [facility] Anticipated DC date: 01/15/21 Family notified: Spouse, Oretha Caprice by: Sharin Mons   Per MD patient ready for DC to [facility]. RN to call report prior to discharge [phone], room 3242). RN, patient, patient's family, and facility notified of DC. Discharge Summary and FL2 sent to facility. DC packet on chart. Ambulance transport requested for patient.   CSW will sign off for now as social work intervention is no longer needed. Please consult [place] again if new needs arise.     Final next level of care: Skilled Nursing Facility Barriers to Discharge: Barriers Resolved   Patient Goals and CMS Choice Patient states their goals for this hospitalization and ongoing recovery are:: Rehab to feel better. CMS Medicare.gov Compare Post Acute Care list provided to:: Patient Choice offered to / list presented to : Spouse, Patient  Discharge Placement   Existing PASRR number confirmed : 01/15/21          Patient [facility] bed at: [facility] Patient to be transferred to facility by: [facility] Name of family member notified: Spouse Patient and family notified of of transfer: 01/15/21  Discharge Plan and Services In-house Referral: Clinical Social Work   Post Acute Care Choice: Skilled Nursing Facility                               Social Determinants of Health (SDOH) Interventions     Readmission Risk Interventions No flowsheet data found.

## 2021-01-15 NOTE — Progress Notes (Signed)
   01/15/21 0456  Assess: MEWS Score  Temp 99.6 F (37.6 C)  BP (!) 117/43  Pulse Rate 70  ECG Heart Rate 70  Resp (!) 28  Level of Consciousness Alert  SpO2 90 %  O2 Device CPAP  O2 Flow Rate (L/min) 2 L/min  Assess: MEWS Score  MEWS Temp 0  MEWS Systolic 0  MEWS Pulse 0  MEWS RR 2  MEWS LOC 0  MEWS Score 2  MEWS Score Color Yellow  Assess: if the MEWS score is Yellow or Red  Were vital signs taken at a resting state? Yes  Focused Assessment No change from prior assessment  Early Detection of Sepsis Score *See Row Information* Low  MEWS guidelines implemented *See Row Information* Yes  Treat  MEWS Interventions Escalated (See documentation below)  Pain Scale 0-10  Pain Score Asleep  Take Vital Signs  Increase Vital Sign Frequency  Yellow: Q 2hr X 2 then Q 4hr X 2, if remains yellow, continue Q 4hrs  Escalate  MEWS: Escalate Yellow: discuss with charge nurse/RN and consider discussing with provider and RRT  Notify: Charge Nurse/RN  Name of Charge Nurse/RN Notified Auroa RN  Date Charge Nurse/RN Notified 01/15/21  Time Charge Nurse/RN Notified [phone]

## 2021-01-15 NOTE — Progress Notes (Addendum)
Nsg Discharge Note  Admit Date:  01/08/2021 Discharge date: 01/15/2021   [facility] to be D/C'd SNF per MD order.  AVS completed.    Discharge Medication: Allergies as of 01/15/2021       Reactions   Compazine Shortness Of Breath   Prochlorperazine Maleate Shortness Of Breath   Topamax [topiramate] Hives, Rash   Codeine Sulfate Nausea Only   Seasonal Ic [cholestatin] Hives   Adhesive [tape] Rash   Iodinated Diagnostic Agents Rash   Uncoded Allergy. Allergen: contrast dyes        Medication List     STOP taking these medications    cephALEXin 500 MG capsule Commonly known as: KEFLEX   Gabapentin Enacarbil 600 MG Tbcr   predniSONE 50 MG tablet Commonly known as: DELTASONE   sulfamethoxazole-trimethoprim 800-160 MG tablet Commonly known as: BACTRIM DS       TAKE these medications    acetaminophen 325 MG tablet Commonly known as: TYLENOL Take 2 tablets (650 mg total) by mouth every 6 (six) hours as needed for mild pain or headache. What changed:  medication strength how much to take when to take this reasons to take this   albuterol 108 (90 Base) MCG/ACT inhaler Commonly known as: VENTOLIN HFA Inhale 2 puffs into the lungs every 4 (four) hours as needed for wheezing.   amLODipine 5 MG tablet Commonly known as: NORVASC Take 1 tablet (5 mg total) by mouth daily. Keep upcoming appointment   aspirin EC 81 MG tablet Take 1 tablet (81 mg total) by mouth 2 (two) times daily.   Biotin 5000 MCG Caps Take 5,000 mcg by mouth 2 (two) times daily. Notes to patient: Did not receive in hospital; resume as directed.   CALCIUM 600+D PO Take 1 tablet by mouth daily. Notes to patient: Did not receive in hospital; resume as directed.   cholecalciferol 1000 units tablet Commonly known as: VITAMIN D Take 3,000 Units by mouth daily. Notes to patient: Did not receive in hospital; resume as directed.   doxycycline 40 MG capsule Commonly known as: ORACEA Take 40 mg by  mouth every morning. For rosacea   ferrous sulfate 325 (65 FE) MG tablet Take 650 mg by mouth daily with breakfast.   fluticasone 50 MCG/ACT nasal spray Commonly known as: FLONASE Place 2 sprays into the nose 2 (two) times daily. Notes to patient: Did not receive in hospital; resume as directed.   gabapentin 100 MG capsule Commonly known as: NEURONTIN Take 2 capsules (200 mg total) by mouth 3 (three) times daily.   hydrocortisone 1 % ointment Apply 1 application topically 2 (two) times daily as needed for itching (rash on hands). Notes to patient: Did not receive in hospital; resume as directed.   hydrOXYzine 25 MG tablet Commonly known as: ATARAX/VISTARIL Take 25 mg by mouth daily.   Klor-Con M20 20 MEQ tablet Generic drug: potassium chloride SA Take 2 tablets (40 mEq total) by mouth daily. What changed:  how much to take when to take this   levocetirizine 5 MG tablet Commonly known as: XYZAL Take 5 mg by mouth at bedtime. Notes to patient: Did not receive in hospital; resume as directed.   magnesium oxide 400 MG tablet Commonly known as: MAG-OX Take 1,200 mg by mouth at bedtime.   metolazone 5 MG tablet Commonly known as: ZAROXOLYN Take 1 tablet (5 mg total) by mouth 3 (three) times a week. What changed: when to take this   montelukast 10 MG tablet Commonly  known as: SINGULAIR Take 10 mg by mouth at bedtime.   nabumetone 750 MG tablet Commonly known as: RELAFEN Take 750 mg by mouth 2 (two) times daily with a meal. Notes to patient: Did not receive in hospital; resume as directed.   pantoprazole 40 MG tablet Commonly known as: PROTONIX Take 40 mg by mouth at bedtime.   PHENobarbital 64.8 MG tablet Commonly known as: LUMINAL Take 2 tablets (129.6 mg total) by mouth at bedtime.   Pramipexole Dihydrochloride 0.75 MG Tb24 Take 1 tablet (0.75 mg total) by mouth at bedtime. What changed: Another medication with the same name was changed. Make sure you  understand how and when to take each.   pramipexole 0.25 MG tablet Commonly known as: MIRAPEX TAKE 1-2 TABS AT 6 PM FOR RESTLESS LEG SYNDROME What changed:  how much to take how to take this when to take this additional instructions   PRESCRIPTION MEDICATION Inhale into the lungs at bedtime. CPAP   rosuvastatin 20 MG tablet Commonly known as: CRESTOR TAKE 1 TABLET (20 MG TOTAL) BY MOUTH DAILY. NEEDS APPOINTMENT FOR FUTURE REFILLS What changed: additional instructions   spironolactone 50 MG tablet Commonly known as: ALDACTONE TAKE 1 TABLET BY MOUTH EVERY DAY   torsemide 20 MG tablet Commonly known as: DEMADEX Take 20 mg by mouth daily.   traMADol 50 MG tablet Commonly known as: ULTRAM Take 1 tablet (50 mg total) by mouth 3 (three) times daily as needed for moderate pain.   valsartan 80 MG tablet Commonly known as: DIOVAN Take 80 mg by mouth 2 (two) times daily. Notes to patient: Did not receive in hospital; resume as directed.   VITAMIN A PO Take 1 capsule by mouth daily. Notes to patient: Did not receive in hospital; resume as directed.   zinc gluconate 50 MG tablet Take 50 mg by mouth daily. Notes to patient: Did not receive in hospital; resume as directed.               Discharge Care Instructions  (From admission, onward)           Start     Ordered   01/15/21 0000  No dressing needed        01/15/21 1042            Discharge Assessment: Vitals:   01/15/21 1237 01/15/21 1704  BP: (!) 125/50 129/66  Pulse:  65  Resp: 15 17  Temp: 98.7 F (37.1 C) 97.9 F (36.6 C)  SpO2:  95%   Skin clean, dry and intact without evidence of skin break down, no evidence of skin tears noted. IV catheter discontinued intact. Site without signs and symptoms of complications - no redness or edema noted at insertion site, patient denies c/o pain - only slight tenderness at site.  Dressing with slight pressure applied.  D/c Instructions-Education: Discharge  instructions given to patient/family with verbalized understanding. D/c education completed with patient/family including follow up instructions, medication list, d/c activities limitations if indicated, with other d/c instructions as indicated by MD - patient able to verbalize understanding, all questions fully answered. Patient instructed to return to ED, call 911, or call MD for any changes in condition.  Patient escorted via [facility] ambulance [facility].   Lyndal Pulley, RN 01/15/2021 6:32 PM

## 2021-01-16 DIAGNOSIS — I5032 Chronic diastolic (congestive) heart failure: Secondary | ICD-10-CM | POA: Diagnosis not present

## 2021-01-16 DIAGNOSIS — G2581 Restless legs syndrome: Secondary | ICD-10-CM | POA: Diagnosis not present

## 2021-01-16 DIAGNOSIS — G4733 Obstructive sleep apnea (adult) (pediatric): Secondary | ICD-10-CM | POA: Diagnosis not present

## 2021-01-16 DIAGNOSIS — I10 Essential (primary) hypertension: Secondary | ICD-10-CM | POA: Diagnosis not present

## 2021-01-16 DIAGNOSIS — E119 Type 2 diabetes mellitus without complications: Secondary | ICD-10-CM | POA: Diagnosis not present

## 2021-01-16 DIAGNOSIS — E785 Hyperlipidemia, unspecified: Secondary | ICD-10-CM | POA: Diagnosis not present

## 2021-01-16 DIAGNOSIS — E550 Rickets, active: Secondary | ICD-10-CM | POA: Diagnosis not present

## 2021-01-16 DIAGNOSIS — G894 Chronic pain syndrome: Secondary | ICD-10-CM | POA: Diagnosis not present

## 2021-01-16 DIAGNOSIS — G9340 Encephalopathy, unspecified: Secondary | ICD-10-CM | POA: Diagnosis not present

## 2021-01-16 DIAGNOSIS — I890 Lymphedema, not elsewhere classified: Secondary | ICD-10-CM | POA: Diagnosis not present

## 2021-01-16 DIAGNOSIS — I609 Nontraumatic subarachnoid hemorrhage, unspecified: Secondary | ICD-10-CM | POA: Diagnosis not present

## 2021-01-17 DIAGNOSIS — I890 Lymphedema, not elsewhere classified: Secondary | ICD-10-CM | POA: Diagnosis not present

## 2021-01-17 DIAGNOSIS — L0390 Cellulitis, unspecified: Secondary | ICD-10-CM | POA: Diagnosis not present

## 2021-01-17 DIAGNOSIS — A419 Sepsis, unspecified organism: Secondary | ICD-10-CM | POA: Diagnosis not present

## 2021-01-17 DIAGNOSIS — R569 Unspecified convulsions: Secondary | ICD-10-CM | POA: Diagnosis not present

## 2021-01-18 ENCOUNTER — Emergency Department (HOSPITAL_COMMUNITY): Payer: Medicare PPO

## 2021-01-18 ENCOUNTER — Inpatient Hospital Stay (HOSPITAL_COMMUNITY)
Admission: EM | Admit: 2021-01-18 | Discharge: 2021-02-01 | DRG: 682 | Disposition: A | Discharge status: Skilled Nursing Facility | Payer: Medicare PPO | Source: Skilled Nursing Facility | Attending: Internal Medicine | Admitting: Internal Medicine

## 2021-01-18 ENCOUNTER — Other Ambulatory Visit: Payer: Self-pay

## 2021-01-18 DIAGNOSIS — Z801 Family history of malignant neoplasm of trachea, bronchus and lung: Secondary | ICD-10-CM | POA: Diagnosis not present

## 2021-01-18 DIAGNOSIS — Z8049 Family history of malignant neoplasm of other genital organs: Secondary | ICD-10-CM

## 2021-01-18 DIAGNOSIS — Z9989 Dependence on other enabling machines and devices: Secondary | ICD-10-CM | POA: Diagnosis not present

## 2021-01-18 DIAGNOSIS — Z20822 Contact with and (suspected) exposure to covid-19: Secondary | ICD-10-CM | POA: Diagnosis present

## 2021-01-18 DIAGNOSIS — E878 Other disorders of electrolyte and fluid balance, not elsewhere classified: Secondary | ICD-10-CM | POA: Diagnosis not present

## 2021-01-18 DIAGNOSIS — L89312 Pressure ulcer of right buttock, stage 2: Secondary | ICD-10-CM | POA: Diagnosis present

## 2021-01-18 DIAGNOSIS — G2581 Restless legs syndrome: Secondary | ICD-10-CM | POA: Diagnosis present

## 2021-01-18 DIAGNOSIS — R443 Hallucinations, unspecified: Secondary | ICD-10-CM | POA: Diagnosis present

## 2021-01-18 DIAGNOSIS — R109 Unspecified abdominal pain: Secondary | ICD-10-CM

## 2021-01-18 DIAGNOSIS — R1084 Generalized abdominal pain: Secondary | ICD-10-CM | POA: Diagnosis not present

## 2021-01-18 DIAGNOSIS — J45909 Unspecified asthma, uncomplicated: Secondary | ICD-10-CM | POA: Diagnosis present

## 2021-01-18 DIAGNOSIS — I10 Essential (primary) hypertension: Secondary | ICD-10-CM | POA: Diagnosis present

## 2021-01-18 DIAGNOSIS — Z743 Need for continuous supervision: Secondary | ICD-10-CM | POA: Diagnosis not present

## 2021-01-18 DIAGNOSIS — L719 Rosacea, unspecified: Secondary | ICD-10-CM | POA: Diagnosis present

## 2021-01-18 DIAGNOSIS — E7800 Pure hypercholesterolemia, unspecified: Secondary | ICD-10-CM | POA: Diagnosis present

## 2021-01-18 DIAGNOSIS — R7889 Finding of other specified substances, not normally found in blood: Secondary | ICD-10-CM | POA: Diagnosis not present

## 2021-01-18 DIAGNOSIS — M549 Dorsalgia, unspecified: Secondary | ICD-10-CM | POA: Diagnosis present

## 2021-01-18 DIAGNOSIS — R4182 Altered mental status, unspecified: Secondary | ICD-10-CM | POA: Diagnosis present

## 2021-01-18 DIAGNOSIS — G9341 Metabolic encephalopathy: Secondary | ICD-10-CM | POA: Diagnosis present

## 2021-01-18 DIAGNOSIS — Z6841 Body Mass Index (BMI) 40.0 and over, adult: Secondary | ICD-10-CM

## 2021-01-18 DIAGNOSIS — R404 Transient alteration of awareness: Secondary | ICD-10-CM | POA: Diagnosis not present

## 2021-01-18 DIAGNOSIS — L89896 Pressure-induced deep tissue damage of other site: Secondary | ICD-10-CM | POA: Diagnosis present

## 2021-01-18 DIAGNOSIS — I517 Cardiomegaly: Secondary | ICD-10-CM | POA: Diagnosis not present

## 2021-01-18 DIAGNOSIS — G40909 Epilepsy, unspecified, not intractable, without status epilepticus: Secondary | ICD-10-CM

## 2021-01-18 DIAGNOSIS — D7589 Other specified diseases of blood and blood-forming organs: Secondary | ICD-10-CM | POA: Diagnosis present

## 2021-01-18 DIAGNOSIS — E876 Hypokalemia: Secondary | ICD-10-CM | POA: Diagnosis not present

## 2021-01-18 DIAGNOSIS — R29898 Other symptoms and signs involving the musculoskeletal system: Secondary | ICD-10-CM | POA: Diagnosis not present

## 2021-01-18 DIAGNOSIS — G4733 Obstructive sleep apnea (adult) (pediatric): Secondary | ICD-10-CM | POA: Diagnosis not present

## 2021-01-18 DIAGNOSIS — K560 Paralytic ileus: Secondary | ICD-10-CM | POA: Diagnosis not present

## 2021-01-18 DIAGNOSIS — E785 Hyperlipidemia, unspecified: Secondary | ICD-10-CM | POA: Diagnosis not present

## 2021-01-18 DIAGNOSIS — Z9119 Patient's noncompliance with other medical treatment and regimen: Secondary | ICD-10-CM | POA: Diagnosis not present

## 2021-01-18 DIAGNOSIS — Z7982 Long term (current) use of aspirin: Secondary | ICD-10-CM

## 2021-01-18 DIAGNOSIS — K529 Noninfective gastroenteritis and colitis, unspecified: Secondary | ICD-10-CM | POA: Diagnosis present

## 2021-01-18 DIAGNOSIS — G9340 Encephalopathy, unspecified: Secondary | ICD-10-CM | POA: Diagnosis not present

## 2021-01-18 DIAGNOSIS — Z91041 Radiographic dye allergy status: Secondary | ICD-10-CM

## 2021-01-18 DIAGNOSIS — R0902 Hypoxemia: Secondary | ICD-10-CM | POA: Diagnosis not present

## 2021-01-18 DIAGNOSIS — Z4659 Encounter for fitting and adjustment of other gastrointestinal appliance and device: Secondary | ICD-10-CM | POA: Diagnosis not present

## 2021-01-18 DIAGNOSIS — D649 Anemia, unspecified: Secondary | ICD-10-CM | POA: Diagnosis present

## 2021-01-18 DIAGNOSIS — M47816 Spondylosis without myelopathy or radiculopathy, lumbar region: Secondary | ICD-10-CM | POA: Diagnosis not present

## 2021-01-18 DIAGNOSIS — G8929 Other chronic pain: Secondary | ICD-10-CM | POA: Diagnosis present

## 2021-01-18 DIAGNOSIS — L0390 Cellulitis, unspecified: Secondary | ICD-10-CM | POA: Diagnosis not present

## 2021-01-18 DIAGNOSIS — K219 Gastro-esophageal reflux disease without esophagitis: Secondary | ICD-10-CM | POA: Diagnosis present

## 2021-01-18 DIAGNOSIS — N179 Acute kidney failure, unspecified: Principal | ICD-10-CM | POA: Diagnosis present

## 2021-01-18 DIAGNOSIS — R600 Localized edema: Secondary | ICD-10-CM | POA: Diagnosis present

## 2021-01-18 DIAGNOSIS — E875 Hyperkalemia: Secondary | ICD-10-CM | POA: Diagnosis present

## 2021-01-18 DIAGNOSIS — Z7401 Bed confinement status: Secondary | ICD-10-CM | POA: Diagnosis not present

## 2021-01-18 DIAGNOSIS — Z79899 Other long term (current) drug therapy: Secondary | ICD-10-CM

## 2021-01-18 DIAGNOSIS — Z888 Allergy status to other drugs, medicaments and biological substances status: Secondary | ICD-10-CM

## 2021-01-18 DIAGNOSIS — Z87891 Personal history of nicotine dependence: Secondary | ICD-10-CM

## 2021-01-18 DIAGNOSIS — K6389 Other specified diseases of intestine: Secondary | ICD-10-CM | POA: Diagnosis not present

## 2021-01-18 DIAGNOSIS — Z885 Allergy status to narcotic agent status: Secondary | ICD-10-CM

## 2021-01-18 DIAGNOSIS — L309 Dermatitis, unspecified: Secondary | ICD-10-CM | POA: Diagnosis present

## 2021-01-18 DIAGNOSIS — M6281 Muscle weakness (generalized): Secondary | ICD-10-CM | POA: Diagnosis not present

## 2021-01-18 DIAGNOSIS — I890 Lymphedema, not elsewhere classified: Secondary | ICD-10-CM | POA: Diagnosis not present

## 2021-01-18 DIAGNOSIS — Z981 Arthrodesis status: Secondary | ICD-10-CM

## 2021-01-18 DIAGNOSIS — M160 Bilateral primary osteoarthritis of hip: Secondary | ICD-10-CM | POA: Diagnosis not present

## 2021-01-18 DIAGNOSIS — M1990 Unspecified osteoarthritis, unspecified site: Secondary | ICD-10-CM | POA: Diagnosis present

## 2021-01-18 DIAGNOSIS — I959 Hypotension, unspecified: Secondary | ICD-10-CM | POA: Diagnosis not present

## 2021-01-18 DIAGNOSIS — Z820 Family history of epilepsy and other diseases of the nervous system: Secondary | ICD-10-CM

## 2021-01-18 LAB — CBC WITH DIFFERENTIAL/PLATELET
Abs Immature Granulocytes: 0.29 10*3/uL — ABNORMAL HIGH (ref 0.00–0.07)
Basophils Absolute: 0.1 10*3/uL (ref 0.0–0.1)
Basophils Relative: 1 %
Eosinophils Absolute: 0.2 10*3/uL (ref 0.0–0.5)
Eosinophils Relative: 2 %
HCT: 39.2 % (ref 36.0–46.0)
Hemoglobin: 12.5 g/dL (ref 12.0–15.0)
Immature Granulocytes: 2 %
Lymphocytes Relative: 15 %
Lymphs Abs: 1.9 10*3/uL (ref 0.7–4.0)
MCH: 32.1 pg (ref 26.0–34.0)
MCHC: 31.9 g/dL (ref 30.0–36.0)
MCV: 100.8 fL — ABNORMAL HIGH (ref 80.0–100.0)
Monocytes Absolute: 1 10*3/uL (ref 0.1–1.0)
Monocytes Relative: 7 %
Neutro Abs: 9.5 10*3/uL — ABNORMAL HIGH (ref 1.7–7.7)
Neutrophils Relative %: 73 %
Platelets: 464 10*3/uL — ABNORMAL HIGH (ref 150–400)
RBC: 3.89 MIL/uL (ref 3.87–5.11)
RDW: 13.4 % (ref 11.5–15.5)
WBC: 12.9 10*3/uL — ABNORMAL HIGH (ref 4.0–10.5)
nRBC: 0 % (ref 0.0–0.2)

## 2021-01-18 LAB — RESP PANEL BY RT-PCR (FLU A&B, COVID) ARPGX2
Influenza A by PCR: NEGATIVE
Influenza B by PCR: NEGATIVE
SARS Coronavirus 2 by RT PCR: NEGATIVE

## 2021-01-18 LAB — BASIC METABOLIC PANEL
Anion gap: 14 (ref 5–15)
BUN: 93 mg/dL — ABNORMAL HIGH (ref 8–23)
CO2: 24 mmol/L (ref 22–32)
Calcium: 9.8 mg/dL (ref 8.9–10.3)
Chloride: 93 mmol/L — ABNORMAL LOW (ref 98–111)
Creatinine, Ser: 3.09 mg/dL — ABNORMAL HIGH (ref 0.44–1.00)
GFR, Estimated: 16 mL/min — ABNORMAL LOW (ref >60)
Glucose, Bld: 119 mg/dL — ABNORMAL HIGH (ref 70–99)
Potassium: 5.2 mmol/L — ABNORMAL HIGH (ref 3.5–5.1)
Sodium: 131 mmol/L — ABNORMAL LOW (ref 135–145)

## 2021-01-18 MED ORDER — LACTATED RINGERS IV BOLUS
1000.0000 mL | Freq: Once | INTRAVENOUS | Status: AC
  Administered 2021-01-19: 1000 mL via INTRAVENOUS
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
## 2021-01-19 ENCOUNTER — Encounter (HOSPITAL_COMMUNITY): Payer: Self-pay | Admitting: Internal Medicine

## 2021-01-19 DIAGNOSIS — N179 Acute kidney failure, unspecified: Secondary | ICD-10-CM | POA: Diagnosis present

## 2021-01-19 DIAGNOSIS — G4733 Obstructive sleep apnea (adult) (pediatric): Secondary | ICD-10-CM | POA: Diagnosis present

## 2021-01-19 DIAGNOSIS — Z9119 Patient's noncompliance with other medical treatment and regimen: Secondary | ICD-10-CM | POA: Diagnosis not present

## 2021-01-19 DIAGNOSIS — L89896 Pressure-induced deep tissue damage of other site: Secondary | ICD-10-CM | POA: Diagnosis present

## 2021-01-19 DIAGNOSIS — Z6841 Body Mass Index (BMI) 40.0 and over, adult: Secondary | ICD-10-CM | POA: Diagnosis not present

## 2021-01-19 DIAGNOSIS — I10 Essential (primary) hypertension: Secondary | ICD-10-CM | POA: Diagnosis present

## 2021-01-19 DIAGNOSIS — R4182 Altered mental status, unspecified: Secondary | ICD-10-CM | POA: Diagnosis present

## 2021-01-19 DIAGNOSIS — E876 Hypokalemia: Secondary | ICD-10-CM | POA: Diagnosis not present

## 2021-01-19 DIAGNOSIS — Z8049 Family history of malignant neoplasm of other genital organs: Secondary | ICD-10-CM | POA: Diagnosis not present

## 2021-01-19 DIAGNOSIS — D649 Anemia, unspecified: Secondary | ICD-10-CM | POA: Diagnosis present

## 2021-01-19 DIAGNOSIS — L309 Dermatitis, unspecified: Secondary | ICD-10-CM | POA: Diagnosis present

## 2021-01-19 DIAGNOSIS — E875 Hyperkalemia: Secondary | ICD-10-CM | POA: Diagnosis present

## 2021-01-19 DIAGNOSIS — K560 Paralytic ileus: Secondary | ICD-10-CM | POA: Diagnosis not present

## 2021-01-19 DIAGNOSIS — J45909 Unspecified asthma, uncomplicated: Secondary | ICD-10-CM | POA: Diagnosis present

## 2021-01-19 DIAGNOSIS — Z4659 Encounter for fitting and adjustment of other gastrointestinal appliance and device: Secondary | ICD-10-CM | POA: Diagnosis not present

## 2021-01-19 DIAGNOSIS — L89312 Pressure ulcer of right buttock, stage 2: Secondary | ICD-10-CM | POA: Diagnosis present

## 2021-01-19 DIAGNOSIS — Z9989 Dependence on other enabling machines and devices: Secondary | ICD-10-CM | POA: Diagnosis not present

## 2021-01-19 DIAGNOSIS — G2581 Restless legs syndrome: Secondary | ICD-10-CM | POA: Diagnosis present

## 2021-01-19 DIAGNOSIS — G40909 Epilepsy, unspecified, not intractable, without status epilepticus: Secondary | ICD-10-CM | POA: Diagnosis present

## 2021-01-19 DIAGNOSIS — E7800 Pure hypercholesterolemia, unspecified: Secondary | ICD-10-CM | POA: Diagnosis present

## 2021-01-19 DIAGNOSIS — K529 Noninfective gastroenteritis and colitis, unspecified: Secondary | ICD-10-CM | POA: Diagnosis present

## 2021-01-19 DIAGNOSIS — R600 Localized edema: Secondary | ICD-10-CM | POA: Diagnosis not present

## 2021-01-19 DIAGNOSIS — Z801 Family history of malignant neoplasm of trachea, bronchus and lung: Secondary | ICD-10-CM | POA: Diagnosis not present

## 2021-01-19 DIAGNOSIS — D7589 Other specified diseases of blood and blood-forming organs: Secondary | ICD-10-CM | POA: Diagnosis present

## 2021-01-19 DIAGNOSIS — G9341 Metabolic encephalopathy: Secondary | ICD-10-CM | POA: Diagnosis present

## 2021-01-19 DIAGNOSIS — R443 Hallucinations, unspecified: Secondary | ICD-10-CM | POA: Diagnosis present

## 2021-01-19 DIAGNOSIS — Z20822 Contact with and (suspected) exposure to covid-19: Secondary | ICD-10-CM | POA: Diagnosis present

## 2021-01-19 LAB — BASIC METABOLIC PANEL
Anion gap: 14 (ref 5–15)
BUN: 93 mg/dL — ABNORMAL HIGH (ref 8–23)
CO2: 23 mmol/L (ref 22–32)
Calcium: 9.6 mg/dL (ref 8.9–10.3)
Chloride: 95 mmol/L — ABNORMAL LOW (ref 98–111)
Creatinine, Ser: 2.56 mg/dL — ABNORMAL HIGH (ref 0.44–1.00)
GFR, Estimated: 20 mL/min — ABNORMAL LOW (ref >60)
Glucose, Bld: 108 mg/dL — ABNORMAL HIGH (ref 70–99)
Potassium: 4.6 mmol/L (ref 3.5–5.1)
Sodium: 132 mmol/L — ABNORMAL LOW (ref 135–145)

## 2021-01-19 LAB — URINALYSIS, ROUTINE W REFLEX MICROSCOPIC
Bilirubin Urine: NEGATIVE
Glucose, UA: NEGATIVE mg/dL
Hgb urine dipstick: NEGATIVE
Ketones, ur: NEGATIVE mg/dL
Leukocytes,Ua: NEGATIVE
Nitrite: NEGATIVE
Protein, ur: NEGATIVE mg/dL
Specific Gravity, Urine: 1.015 (ref 1.005–1.030)
pH: 5.5 (ref 5.0–8.0)

## 2021-01-19 LAB — CBC
HCT: 34.8 % — ABNORMAL LOW (ref 36.0–46.0)
HCT: 35.1 % — ABNORMAL LOW (ref 36.0–46.0)
Hemoglobin: 11.1 g/dL — ABNORMAL LOW (ref 12.0–15.0)
Hemoglobin: 11.3 g/dL — ABNORMAL LOW (ref 12.0–15.0)
MCH: 31.7 pg (ref 26.0–34.0)
MCH: 32 pg (ref 26.0–34.0)
MCHC: 31.6 g/dL (ref 30.0–36.0)
MCHC: 32.5 g/dL (ref 30.0–36.0)
MCV: 100.3 fL — ABNORMAL HIGH (ref 80.0–100.0)
MCV: 98.6 fL (ref 80.0–100.0)
Platelets: 423 10*3/uL — ABNORMAL HIGH (ref 150–400)
Platelets: 467 10*3/uL — ABNORMAL HIGH (ref 150–400)
RBC: 3.5 MIL/uL — ABNORMAL LOW (ref 3.87–5.11)
RBC: 3.53 MIL/uL — ABNORMAL LOW (ref 3.87–5.11)
RDW: 13.3 % (ref 11.5–15.5)
RDW: 13.4 % (ref 11.5–15.5)
WBC: 11.7 10*3/uL — ABNORMAL HIGH (ref 4.0–10.5)
WBC: 12.2 10*3/uL — ABNORMAL HIGH (ref 4.0–10.5)
nRBC: 0 % (ref 0.0–0.2)
nRBC: 0 % (ref 0.0–0.2)

## 2021-01-19 LAB — C DIFFICILE QUICK SCREEN W PCR REFLEX
C Diff antigen: NEGATIVE
C Diff interpretation: NOT DETECTED
C Diff toxin: NEGATIVE

## 2021-01-19 LAB — CREATININE, SERUM
Creatinine, Ser: 2.51 mg/dL — ABNORMAL HIGH (ref 0.44–1.00)
GFR, Estimated: 20 mL/min — ABNORMAL LOW (ref >60)

## 2021-01-19 MED ORDER — MONTELUKAST SODIUM 10 MG PO TABS
10.0000 mg | ORAL_TABLET | Freq: Every day | ORAL | Status: DC
  Administered 2021-01-19 – 2021-01-31 (×12): 10 mg via ORAL
  Filled 2021-01-19 (×13): qty 1

## 2021-01-19 MED ORDER — ASPIRIN EC 81 MG PO TBEC
81.0000 mg | DELAYED_RELEASE_TABLET | Freq: Two times a day (BID) | ORAL | Status: DC
  Administered 2021-01-19 – 2021-02-01 (×27): 81 mg via ORAL
  Filled 2021-01-19 (×27): qty 1

## 2021-01-19 MED ORDER — AMLODIPINE BESYLATE 5 MG PO TABS
5.0000 mg | ORAL_TABLET | Freq: Every day | ORAL | Status: DC
  Administered 2021-01-19: 5 mg via ORAL
  Filled 2021-01-19: qty 1

## 2021-01-19 MED ORDER — PRAMIPEXOLE DIHYDROCHLORIDE 0.25 MG PO TABS: 0.2500 mg | ORAL_TABLET | Freq: Every evening | ORAL | Status: DC

## 2021-01-19 MED ORDER — PRAMIPEXOLE DIHYDROCHLORIDE ER 0.75 MG PO TB24: 1.0000 | ORAL_TABLET | Freq: Every day | ORAL | Status: DC

## 2021-01-19 MED ORDER — SODIUM CHLORIDE 0.9 % IV SOLN: INTRAVENOUS | Status: DC

## 2021-01-19 MED ORDER — DOXYCYCLINE 40 MG PO CPDR: 40.0000 mg | DELAYED_RELEASE_CAPSULE | Freq: Every morning | ORAL | Status: DC

## 2021-01-19 MED ORDER — PRAMIPEXOLE DIHYDROCHLORIDE 0.25 MG PO TABS
0.2500 mg | ORAL_TABLET | Freq: Four times a day (QID) | ORAL | Status: DC
  Administered 2021-01-19 – 2021-01-20 (×5): 0.25 mg via ORAL
  Filled 2021-01-19 (×7): qty 1

## 2021-01-19 MED ORDER — ROSUVASTATIN CALCIUM 20 MG PO TABS
20.0000 mg | ORAL_TABLET | Freq: Every day | ORAL | Status: DC
  Administered 2021-01-19 – 2021-02-01 (×14): 20 mg via ORAL
  Filled 2021-01-19 (×14): qty 1

## 2021-01-19 MED ORDER — ALBUTEROL SULFATE (2.5 MG/3ML) 0.083% IN NEBU: 3.0000 mL | INHALATION_SOLUTION | RESPIRATORY_TRACT | Status: DC | PRN

## 2021-01-19 MED ORDER — ACETAMINOPHEN 325 MG PO TABS
650.0000 mg | ORAL_TABLET | Freq: Four times a day (QID) | ORAL | Status: DC | PRN
  Administered 2021-01-24 – 2021-01-29 (×3): 650 mg via ORAL
  Filled 2021-01-19 (×4): qty 2

## 2021-01-19 MED ORDER — PHENOBARBITAL 32.4 MG PO TABS
129.6000 mg | ORAL_TABLET | Freq: Every day | ORAL | Status: DC
  Administered 2021-01-19 – 2021-01-31 (×12): 129.6 mg via ORAL
  Filled 2021-01-19 (×12): qty 4

## 2021-01-19 MED ORDER — HEPARIN SODIUM (PORCINE) 5000 UNIT/ML IJ SOLN
5000.0000 [IU] | Freq: Three times a day (TID) | INTRAMUSCULAR | Status: DC
  Administered 2021-01-19 – 2021-02-01 (×38): 5000 [IU] via SUBCUTANEOUS
  Filled 2021-01-19 (×36): qty 1

## 2021-01-19 MED ORDER — PRAMIPEXOLE DIHYDROCHLORIDE 0.25 MG PO TABS
0.2500 mg | ORAL_TABLET | Freq: Three times a day (TID) | ORAL | Status: DC
  Filled 2021-01-19: qty 1

## 2021-01-19 MED ORDER — LOPERAMIDE HCL 2 MG PO CAPS
2.0000 mg | ORAL_CAPSULE | ORAL | Status: DC | PRN
  Filled 2021-01-19: qty 1

## 2021-01-19 MED ORDER — GABAPENTIN 100 MG PO CAPS
200.0000 mg | ORAL_CAPSULE | Freq: Three times a day (TID) | ORAL | Status: DC
  Administered 2021-01-19 – 2021-01-20 (×4): 200 mg via ORAL
  Filled 2021-01-19 (×4): qty 2

## 2021-01-19 MED ORDER — HYDRALAZINE HCL 20 MG/ML IJ SOLN: 10.0000 mg | INTRAMUSCULAR | Status: DC | PRN

## 2021-01-19 MED ORDER — DOXYCYCLINE HYCLATE 50 MG PO CAPS
50.0000 mg | ORAL_CAPSULE | Freq: Every day | ORAL | Status: DC
  Administered 2021-01-19 – 2021-01-20 (×2): 50 mg via ORAL
  Filled 2021-01-19 (×2): qty 1

## 2021-01-19 MED ORDER — ACETAMINOPHEN 650 MG RE SUPP: 650.0000 mg | Freq: Four times a day (QID) | RECTAL | Status: DC | PRN

## 2021-01-19 NOTE — ED Notes (Signed)
Breakfast Ordered 
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
## 2021-01-19 NOTE — Progress Notes (Signed)
[facility] PLAN OF CARE NOTE Patient: [name] BJY:[id number]   PCP: [name], MD DOB: 1951/01/07   DOA: 01/18/2021   DOS: 01/19/2021    Patient was admitted by my colleague earlier on 01/19/2021. I have reviewed the H&P as well as assessment and plan and agree with the same. Important changes in the plan are listed below.  Plan of care: Principal Problem:   ARF (acute renal failure) (HCC) Active Problems:   Restless legs syndrome (RLS)   Seizure disorder (HCC)   Essential hypertension   OSA on CPAP   Acute renal failure (ARF) (HCC) C.diff negative.   Author: Lynden Oxford, MD [facility] 01/19/2021 3:48 PM   If 7PM-7AM, please contact night-coverage at [email or web address]   n

## 2021-01-19 NOTE — Progress Notes (Signed)
Pt states they do not wish to wear CPAP tonight. Currently sat 99% on RA. RT will cont to monitor.

## 2021-01-19 NOTE — Progress Notes (Signed)
NEW ADMISSION NOTE New Admission Note:   Arrival Method: Patient arrived from ED on stretcher. Mental Orientation: Alert and oriented x4. Telemetry: 74M-16 NSR Assessment: Completed Skin:  MASD bottom, abrasion on the right thigh, cellulitis and lymphedema on both lower extremities.   IV: L AC infusing NS @ 100 ml/ hr Pain: Denies any pain currently. Tubes:  external female catheter Safety Measures: Safety Fall Prevention Plan has been given, discussed and signed Admission: Completed [facility] Orientation: Patient has been orientated to the room, unit and staff.  Family:  Husband at bed side.  Orders have been reviewed and implemented. Will continue to monitor the patient. Call light has been placed within reach and bed alarm has been activated.   Arvilla Meres, RN

## 2021-01-20 DIAGNOSIS — N179 Acute kidney failure, unspecified: Secondary | ICD-10-CM | POA: Diagnosis not present

## 2021-01-20 LAB — MAGNESIUM: Magnesium: 2.2 mg/dL (ref 1.7–2.4)

## 2021-01-20 LAB — CBC
HCT: 31 % — ABNORMAL LOW (ref 36.0–46.0)
Hemoglobin: 10 g/dL — ABNORMAL LOW (ref 12.0–15.0)
MCH: 31.7 pg (ref 26.0–34.0)
MCHC: 32.3 g/dL (ref 30.0–36.0)
MCV: 98.4 fL (ref 80.0–100.0)
Platelets: 442 10*3/uL — ABNORMAL HIGH (ref 150–400)
RBC: 3.15 MIL/uL — ABNORMAL LOW (ref 3.87–5.11)
RDW: 13.2 % (ref 11.5–15.5)
WBC: 9.9 10*3/uL (ref 4.0–10.5)
nRBC: 0 % (ref 0.0–0.2)

## 2021-01-20 LAB — BASIC METABOLIC PANEL
Anion gap: 9 (ref 5–15)
BUN: 46 mg/dL — ABNORMAL HIGH (ref 8–23)
CO2: 24 mmol/L (ref 22–32)
Calcium: 9.9 mg/dL (ref 8.9–10.3)
Chloride: 104 mmol/L (ref 98–111)
Creatinine, Ser: 1.06 mg/dL — ABNORMAL HIGH (ref 0.44–1.00)
GFR, Estimated: 57 mL/min — ABNORMAL LOW (ref >60)
Glucose, Bld: 93 mg/dL (ref 70–99)
Potassium: 4.1 mmol/L (ref 3.5–5.1)
Sodium: 137 mmol/L (ref 135–145)

## 2021-01-20 MED ORDER — SACCHAROMYCES BOULARDII 250 MG PO CAPS
250.0000 mg | ORAL_CAPSULE | Freq: Two times a day (BID) | ORAL | Status: DC
  Administered 2021-01-20 – 2021-01-28 (×16): 250 mg via ORAL
  Filled 2021-01-20 (×16): qty 1

## 2021-01-20 MED ORDER — PRAMIPEXOLE DIHYDROCHLORIDE 0.25 MG PO TABS
0.5000 mg | ORAL_TABLET | Freq: Every evening | ORAL | Status: DC
  Administered 2021-01-20 – 2021-01-31 (×11): 0.5 mg via ORAL
  Filled 2021-01-20 (×13): qty 2

## 2021-01-20 NOTE — Progress Notes (Signed)
[facility] Progress Note  Patient: [name]    OEU:[id number]  DOA: 01/18/2021     Date of Service: the patient was seen and examined on 01/20/2021  Brief hospital course: Past medical history of chronic lower extremity lymphedema, anemia, migraine, OSA, restless leg syndrome, seizure disorder.  Presents with complaints of confusion and diarrhea. Currently plan is treat AKI with IV fluids and monitor for improvement in diarrhea and mentation.  Subjective: Confused and confabulating.  No nausea no vomiting.  No abdominal pain.  No blood in the stool.  2 episodes of loose BM so far.  Minimal oral intake.  Assessment and Plan: 1.  Acute kidney injury In the setting of diarrhea. Holding diuretics as well as ARB. Continuing IV fluids gently for now.  2.  Diarrhea Etiology not clear. C. difficile negative. Continue Imodium. Improving.  Monitor.  3.  Acute metabolic encephalopathy Likely combination of polypharmacy as well as AKI with uremia. Will hold gabapentin reduce the dose of pramipexole and monitor.  4.  History of rosacea On doxycycline.  Given ongoing diarrhea will hold it.  5.  Sleep apnea Continue CPAP nightly.  6.  Chronic anemia H&H stable. Monitor.  Scheduled Meds:  aspirin EC  81 mg Oral BID   heparin  5,000 Units Subcutaneous Q8H   montelukast  10 mg Oral QHS   PHENobarbital  129.6 mg Oral QHS   pramipexole  0.5 mg Oral QPM   rosuvastatin  20 mg Oral Daily   saccharomyces boulardii  250 mg Oral BID   Continuous Infusions:  sodium chloride 75 mL/hr at 01/20/21 1443   PRN Meds: acetaminophen **OR** acetaminophen, albuterol, loperamide  Body mass index is 49.35 kg/m.    Pressure Injury 01/09/21 Buttocks Right Stage 2 -  Partial thickness loss of dermis presenting as a shallow open injury with a red, pink wound bed without slough. (Active)  01/09/21 1300  Location: Buttocks  Location Orientation: Right  Staging: Stage 2 -  Partial thickness  loss of dermis presenting as a shallow open injury with a red, pink wound bed without slough.  Wound Description (Comments):   Present on Admission: Yes     Pressure Injury 01/19/21 Thigh Anterior;Right Deep Tissue Pressure Injury - Purple or maroon localized area of discolored intact skin or blood-filled blister due to damage of underlying soft tissue from pressure and/or shear. Linear DTI (Active)  01/19/21 1936  Location: Thigh  Location Orientation: Anterior;Right  Staging: Deep Tissue Pressure Injury - Purple or maroon localized area of discolored intact skin or blood-filled blister due to damage of underlying soft tissue from pressure and/or shear.  Wound Description (Comments): Linear DTI  Present on Admission: Yes     DVT Prophylaxis:   heparin injection 5,000 Units Start: 01/19/21 0600    Advance goals of care discussion: Pt is Full code.  Family Communication: family was present at bedside, at the time of interview.  The pt provided permission to discuss medical plan with the family. Opportunity was given to ask question and all questions were answered satisfactorily.   Data Reviewed: I have personally reviewed and interpreted daily labs, tele strips, imaging. Sodium 137.  Potassium 4.1.  Creatinine improving.  Leukocytosis improving.  Hemoglobin dropped from 12.5-10.0.  Physical Exam:  General: Appear in mild distress, no Rash; Oral Mucosa Clear, moist. no Abnormal Neck Mass Or lumps, Conjunctiva normal  Cardiovascular: S1 and S2 Present, no Murmur, Respiratory: good respiratory effort, Bilateral Air entry present and CTA, no Crackles,  no wheezes Abdomen: Bowel Sound present, Soft and no tenderness Extremities: no Pedal edema Neurology: alert and oriented to time, place, and person affect appropriate. no new focal deficit, asterixis present, confabulating. Gait not checked due to patient safety concerns   Vitals:   01/20/21 0434 01/20/21 1039 01/20/21 1815 01/20/21  2116  BP: (!) 143/57 (!) 112/50 (!) 142/49 (!) 120/42  Pulse: 75 75 80 74  Resp: 18 20 16 17   Temp: 98 F (36.7 C) 98.4 F (36.9 C) 99 F (37.2 C) 98.6 F (37 C)  TempSrc: Oral Oral Oral   SpO2: 94% 93% 97% 96%  Weight:      Height:        Disposition:  Status is: Inpatient  Remains inpatient appropriate because:IV treatments appropriate due to intensity of illness or inability to take PO  Dispo: The patient is from: Home              Anticipated d/c is to: SNF              Patient currently is not medically stable to d/c.   Difficult to place patient No   Time spent: 35 minutes. I reviewed all nursing notes, pharmacy notes, vitals, pertinent old records. I have discussed plan of care as described above with RN.  Author: , MD [facility] 01/20/2021 10:04 PM  To reach On-call, see care teams to locate the attending and reach out via [email or web address]. Between 7PM-7AM, please contact night-coverage If you still have difficulty reaching the attending provider, please page the [facility] (Director on Call) for [facility] on amion for assistance.

## 2021-01-20 NOTE — TOC Initial Note (Signed)
Transition of Care [facility]) - Initial/Assessment Note    Patient Details  Name: [name] MRN: [id number] Date of Birth: [birth year]  [facility] CM/SW Contact:    Tom-Johnson, Hershal Coria, RN Phone Number: 01/20/2021, 4:36 PM  Clinical Narrative:                 CM spoke with patient and spouse at bedside. Patient lives with spouse and has children who are supportive. Husband drives patient to and from her appointments and also to do errands. Has a shower chair, wheelchair, walker and cane at home. Admitted from [facility] and Rehab. Awaiting PT/OT eval for discharge disposition. Noted to search for words but answers correctly after a slight delay. Patient eager to get rehab and go home. TOC will continue to follow with needs.   Expected Discharge Plan: Skilled Nursing Facility Barriers to Discharge: Continued Medical Work up   Patient Goals and CMS Choice Patient states their goals for this hospitalization and ongoing recovery are:: To go home CMS Medicare.gov Compare Post Acute Care list provided to:: Patient Choice offered to / list presented to : Patient, Spouse  Expected Discharge Plan and Services Expected Discharge Plan: Skilled Nursing Facility In-house Referral: Clinical Social Work Discharge Planning Services: CM Consult   Living arrangements for the past 2 months: Single Family Home                                      Prior Living Arrangements/Services Living arrangements for the past 2 months: Single Family Home Lives with:: Spouse Patient language and need for interpreter reviewed:: Yes Do you feel safe going back to the place where you live?: Yes      Need for Family Participation in Patient Care: Yes (Comment) Care giver support system in place?: Yes (comment) Current home services: DME (walker, wheelchair, cane, shower chair) Criminal Activity/Legal Involvement Pertinent to Current Situation/Hospitalization: No - Comment as  needed  Activities of Daily Living      Permission Sought/Granted Permission sought to share information with : Case Manager, Family Supports, Magazine features editor Permission granted to share information with : Yes, Verbal Permission Granted              Emotional Assessment Appearance:: Appears stated age Attitude/Demeanor/Rapport: Engaged Affect (typically observed): Accepting, Appropriate, Hopeful Orientation: : Oriented to Self, Oriented to Place, Oriented to  Time, Oriented to Situation Alcohol / Substance Use: Not Applicable Psych Involvement: No (comment)  Admission diagnosis:  ARF (acute renal failure) (HCC) [N17.9] Acute renal failure (ARF) (HCC) [N17.9] Acute kidney injury (HCC) [N17.9] Patient Active Problem List   Diagnosis Date Noted   Acute renal failure (ARF) (HCC) 01/19/2021   ARF (acute renal failure) (HCC) 01/18/2021   SIRS (systemic inflammatory response syndrome) (HCC) 01/09/2021   Acute encephalopathy 01/09/2021   Hyponatremia 12/20/2018   Cellulitis 12/20/2018   Cellulitis of left lower extremity 12/19/2018   Hypokalemia 02/17/2017   DOE (dyspnea on exertion) 01/08/2017   Medication management 01/08/2017   Weight gain 01/08/2017   Lymphedema of right lower extremity 10/22/2016   Wound dehiscence, surgical, subsequent encounter 05/12/2016   S/P ankle arthrodesis 03/12/2016   OSA on CPAP 09/10/2015   Nocturia more than twice per night 09/10/2015   Morbid obesity due to excess calories (HCC) 09/10/2015   Loss of consciousness (HCC) 08/15/2015   Fracture of ankle, trimalleolar, closed 07/03/2015   Syncope  07/02/2015   Seizure disorder (HCC) 07/02/2015   Essential hypertension 07/02/2015   Bilateral lower extremity edema 07/02/2015   Closed right ankle fracture 07/02/2015   GERD (gastroesophageal reflux disease) 07/02/2015   HLD (hyperlipidemia) 07/02/2015   Anosmia/chronic 07/02/2015   Leukocytosis 07/02/2015   Restless legs syndrome  (RLS) 04/28/2013   Sleep apnea with use of continuous positive airway pressure (CPAP) 01/24/2013   PCP:  [name], MD Pharmacy:   [phone] [place], [place] - [address] [place] [address] Phone: [phone] Fax: [phone]     Social Determinants of Health (SDOH) Interventions    Readmission Risk Interventions No flowsheet data found.

## 2021-01-21 DIAGNOSIS — N179 Acute kidney failure, unspecified: Secondary | ICD-10-CM | POA: Diagnosis not present

## 2021-01-21 LAB — T4, FREE: Free T4: 1.1 ng/dL (ref 0.61–1.12)

## 2021-01-21 LAB — VITAMIN B12: Vitamin B-12: 782 pg/mL (ref 180–914)

## 2021-01-21 LAB — TSH: TSH: 2.248 u[IU]/mL (ref 0.350–4.500)

## 2021-01-21 MED ORDER — THIAMINE HCL 100 MG/ML IJ SOLN
100.0000 mg | Freq: Once | INTRAMUSCULAR | Status: AC
  Administered 2021-01-21: 100 mg via INTRAVENOUS
  Filled 2021-01-21: qty 2

## 2021-01-21 MED ORDER — THIAMINE HCL 100 MG/ML IJ SOLN
500.0000 mg | INTRAVENOUS | Status: DC
  Administered 2021-01-22 – 2021-01-26 (×5): 500 mg via INTRAVENOUS
  Filled 2021-01-21 (×8): qty 5

## 2021-01-21 NOTE — Evaluation (Addendum)
Physical Therapy Evaluation Patient Details Name: [name] MRN: [id number] DOB: [birth year] Today's Date: 01/21/2021  History of Present Illness  Past medical history of chronic lower extremity lymphedema, anemia, migraine, OSA, restless leg syndrome, seizure disorder.  Presents with complaints of confusion and diarrhea.  Currently plan is treat AKI with IV fluids and monitor for improvement in diarrhea and mentation.  Clinical Impression  Patient presents with decreased mobility due to weakness, decreased cognition, decreased balance, decreased activity tolerance and poor safety awareness with risk of falls.  She was functioning with walker at home prior to most recent admission and only stayed a couple of days at [facility] prior to return.  Feel she will benefit from skilled PT in the acute setting and from follow up interdisciplinary therapies at CIR level.  Spouse able to assist at home as well.        Recommendations for follow up therapy are one component of a multi-disciplinary discharge planning process, led by the attending physician.  Recommendations may be updated based on patient status, additional functional criteria and insurance authorization.  Follow Up Recommendations Supervision/Assistance - 24 hour;CIR    Equipment Recommendations  None recommended by PT    Recommendations for Other Services Rehab consult     Precautions / Restrictions Precautions Precautions: Fall Precaution Comments: pain in RLE Restrictions Weight Bearing Restrictions: No      Mobility  Bed Mobility Overal bed mobility: Needs Assistance Bed Mobility: Supine to Sit     Supine to sit: Mod assist;HOB elevated Sit to supine: Max assist;+2 for physical assistance        Transfers Overall transfer level: Needs assistance Equipment used: Rolling walker (2 wheeled) Transfers: Sit to/from Stand Sit to Stand: Mod assist Stand pivot transfers: Mod assist       General transfer  comment: assist to stand from EOB and noted pt soiled so returned to sitting and then back to stand for hygiene, stepping to recliner with RW pt c/o fear of falling, reassured, and gave step by step cues  Ambulation/Gait                Stairs            Wheelchair Mobility    Modified Rankin (Stroke Patients Only)       Balance Overall balance assessment: Needs assistance   Sitting balance-Leahy Scale: Fair     Standing balance support: Bilateral upper extremity supported Standing balance-Leahy Scale: Poor Standing balance comment: UE support for balance in standing                             Pertinent Vitals/Pain Pain Assessment: Faces Pain Score: 7  Faces Pain Scale: Hurts even more Pain Location: R LE in standing and for transitions Pain Descriptors / Indicators: Grimacing;Guarding Pain Intervention(s): Monitored during session;Repositioned    Home Living Family/patient expects to be discharged to:: Private residence Living Arrangements: Spouse/significant other Available Help at Discharge: Family;Available 24 hours/day Type of Home: House Home Access: Ramped entrance     Home Layout: Two level;1/2 bath on main level;Able to live on main level with bedroom/bathroom Home Equipment: Dan Humphreys - 2 wheels;Cane - single point;Shower seat;Grab bars - tub/shower;Hand held shower head;Transport chair;Adaptive equipment;Bedside commode;Other (comment);Wheelchair - manual Additional Comments: sleeps in recliner/lift chair, walks to half bath at night.    Prior Function Level of Independence: Needs assistance      ADL's / Homemaking Assistance Needed: Does sponge  baths on her own; has assist with lower body dressing; does toileting on her own        Hand Dominance   Dominant Hand: Left    Extremity/Trunk Assessment   Upper Extremity Assessment Upper Extremity Assessment: Defer to OT evaluation    Lower Extremity Assessment Lower Extremity  Assessment: LLE deficits/detail;RLE deficits/detail RLE Deficits / Details: AAROM limited hip flexion due to weakness 2/5, knee extension 3/4, but painful with ambulation per pt with limited flexion, ankle previously fused able to flex through limited range; skin changes evident from lymphedema LLE Deficits / Details: AROM WFL, strength grossly 3+/5 at least.       Communication   Communication: No difficulties  Cognition Arousal/Alertness: Awake/alert Behavior During Therapy: WFL for tasks assessed/performed Overall Cognitive Status: Impaired/Different from baseline Area of Impairment: Safety/judgement;Awareness                 Orientation Level: Place;Time;Situation;Disoriented to   Memory: Decreased short-term memory Following Commands: Follows one step commands inconsistently Safety/Judgement: Decreased awareness of deficits;Decreased awareness of safety   Problem Solving: Slow processing;Requires verbal cues;Decreased initiation;Difficulty sequencing;Requires tactile cues General Comments: coopertive and plesant during session, but confused and confabulating, daughter in the room      General Comments General comments (skin integrity, edema, etc.): Patient's daughter in the room and gives history since patient has limited attention, awareness and memory.  Noted soiled from purewick and BM so in standing help for hygiene and to sit in clean chair    Exercises     Assessment/Plan    PT Assessment Patient needs continued PT services  PT Problem List Decreased strength;Decreased mobility;Decreased safety awareness;Decreased range of motion;Decreased activity tolerance;Cardiopulmonary status limiting activity;Decreased balance;Decreased knowledge of use of DME;Pain;Decreased cognition;Decreased knowledge of precautions       PT Treatment Interventions DME instruction;Therapeutic activities;Gait training;Therapeutic exercise;Balance training;Functional mobility  training;Cognitive remediation;Patient/family education    PT Goals (Current goals can be found in the Care Plan section)  Acute Rehab PT Goals Patient Stated Goal: to get back home PT Goal Formulation: With patient/family Time For Goal Achievement: 02/04/21 Potential to Achieve Goals: Good    Frequency Min 2X/week   Barriers to discharge        Co-evaluation               AM-PAC PT "6 Clicks" Mobility  Outcome Measure Help needed turning from your back to your side while in a flat bed without using bedrails?: A Lot Help needed moving from lying on your back to sitting on the side of a flat bed without using bedrails?: A Lot Help needed moving to and from a bed to a chair (including a wheelchair)?: A Lot Help needed standing up from a chair using your arms (e.g., wheelchair or bedside chair)?: A Lot Help needed to walk in hospital room?: Total Help needed climbing 3-5 steps with a railing? : Total 6 Click Score: 10    End of Session Equipment Utilized During Treatment: Gait belt Activity Tolerance: Patient limited by fatigue Patient left: with call bell/phone within reach;in chair;with family/visitor present Nurse Communication: Mobility status PT Visit Diagnosis: Muscle weakness (generalized) (M62.81);Other abnormalities of gait and mobility (R26.89);Other symptoms and signs involving the nervous system (R29.898)    Time: 1610-9604 PT Time Calculation (min) (ACUTE ONLY): 33 min   Charges:   PT Evaluation $PT Eval Moderate Complexity: 1 Mod PT Treatments $Therapeutic Activity: 8-22 mins        Sheran Lawless, PT Acute Rehabilitation Services  Pager:[phone] Office:[phone] 01/21/2021   Elray Mcgregor 01/21/2021, 3:25 PM

## 2021-01-21 NOTE — Progress Notes (Signed)
OT evaluation Pt. Was cooperative with therapy but very confused. Pt. Was able to follow 1 step commands with cues to initiate and continue task. Pt. Required extensive assist with ADLs and may have better performance when confusion improves. Pt. Required 2 person assist for sit to stand and stand pivot transfer. Pt. Family expect her to return to snf for rehab. Acute ot to follow.    01/21/21 1400  OT Visit Information  Last OT Received On 01/21/21  Assistance Needed +2  History of Present Illness [name] is a 70 y.o. female with history of chronic lower extremity lymphedema was recently admitted for sepsis secondary to cellulitis of the lower extremity was discharged to rehab was found to have elevated potassium and was referred to the ER.  Patient states she has been having diarrhea for last couple of days.  Denies abdominal pain nausea vomiting denies chest pain or shortness of breath.  Patient is a poor historian.  Of recently has been found to be increasingly confused.  Precautions  Precautions Fall  Precaution Comments at least one fall in the past 6 months per daughter  Restrictions  Weight Bearing Restrictions No  Home Living  Family/patient expects to be discharged to: Skilled nursing facility  Living Arrangements Spouse/significant other  Available Help at Discharge Family;Available 24 hours/day  Type of Home House  Home Access Level entry  Home Layout Two level;1/2 bath on main level;Able to live on main level with bedroom/bathroom  Alternate Level Stairs-Number of Steps flight  Alternate Level Stairs-Rails Right;Left;Can reach both  [employer] Shower/Tub Walk-in shower  Bathroom Toilet Handicapped height  Bathroom Accessibility Yes  How Accessible Accessible via walker  Home Equipment Walker - 2 wheels;Cane - single point;Shower seat;Grab bars - tub/shower;Hand held shower head;Transport chair;Adaptive equipment;BSC;Other (comment);Wheelchair - Automotive engineer  Reacher;Long-handled shoe horn;Long-handled sponge  Additional Comments sleeps in recliner/lift chair, walks to half bath at night.  Prior Function  Level of Independence Needs assistance  ADL's / Homemaking Assistance Needed Per pt. and dtr she took sink bath and was able to get herself dressed. Pt. husband did the cooking and cleaning.  Communication  Communication No difficulties  Pain Assessment  Pain Assessment 0-10  Pain Score 7  Pain Location R hip, leg  Pain Descriptors / Indicators Aching  Pain Intervention(s) Monitored during session;Limited activity within patient's tolerance;Other (comment) (Pt. does not want pain medicine)  Cognition  Arousal/Alertness Awake/alert  Behavior During Therapy WFL for tasks assessed/performed  Overall Cognitive Status Impaired/Different from baseline  Area of Impairment Safety/judgement;Awareness  Orientation Level Place;Time;Situation  Memory Decreased short-term memory  Following Commands Follows one step commands inconsistently  Safety/Judgement Decreased awareness of deficits;Decreased awareness of safety  Problem Solving Slow processing;Requires verbal cues;Decreased initiation;Difficulty sequencing;Requires tactile cues  General Comments coopertive and plesant during session  Upper Extremity Assessment  Upper Extremity Assessment Generalized weakness  Lower Extremity Assessment  Lower Extremity Assessment Defer to PT evaluation  ADL  Overall ADL's  Needs assistance/impaired  Eating/Feeding Minimal assistance;Sitting  Grooming Wash/dry hands;Wash/dry face;Brushing hair;Minimal assistance;Sitting  Upper Body Bathing Moderate assistance;Sitting  Lower Body Bathing Total assistance;+2 for physical assistance;Sit to/from stand  Upper Body Dressing  Minimal assistance;Sitting  Lower Body Dressing Total assistance;+2 for physical assistance;Sit to/from Scientist, research (life sciences) Moderate assistance;+2 for physical assistance;+2 for  safety/equipment;RW  Toileting- Clothing Manipulation and Hygiene Total assistance;+2 for safety/equipment  Functional mobility during ADLs Moderate assistance;+2 for physical assistance;+2 for safety/equipment;Rolling walker  General ADL Comments Pt. very confused  and had difficulty wtih ADLs and mobility.  Vision- History  Baseline Vision/History 1 Wears glasses  Ability to See in Adequate Light 0 Adequate  Patient Visual Report No change from baseline  Vision- Assessment  Vision Assessment? No apparent visual deficits  Bed Mobility  Overal bed mobility Needs Assistance  Sit to supine Max assist;+2 for physical assistance  Transfers  Overall transfer level Needs assistance  Equipment used Rolling walker (2 wheeled)  Sit to Stand Mod assist;+2 physical assistance  Stand pivot transfers Mod assist;+2 physical assistance  General transfer comment cues for proper hand placement.  Balance  Sitting balance-Leahy Scale Fair  Standing balance-Leahy Scale Poor  OT - End of Session  Equipment Utilized During Treatment Rolling walker  Activity Tolerance Patient limited by pain;Patient limited by fatigue  Patient left in bed;with call bell/phone within reach;with family/visitor present  Nurse Communication  (ok therapy)  OT Assessment  OT Recommendation/Assessment Patient needs continued OT Services  OT Visit Diagnosis Unsteadiness on feet (R26.81);Muscle weakness (generalized) (M62.81);Other symptoms and signs involving cognitive function  OT Problem List Decreased strength;Decreased range of motion;Decreased activity tolerance;Impaired balance (sitting and/or standing);Obesity;Decreased cognition  OT Plan  OT Frequency (ACUTE ONLY) Min 2X/week  OT Treatment/Interventions (ACUTE ONLY) Self-care/ADL training;Therapeutic exercise;DME and/or AE instruction;Balance training;Patient/family education;Therapeutic activities  AM-PAC OT "6 Clicks" Daily Activity Outcome Measure (Version 2)  Help  from another person eating meals? 3  Help from another person taking care of personal grooming? 3  Help from another person toileting, which includes using toliet, bedpan, or urinal? 1  Help from another person bathing (including washing, rinsing, drying)? 2  Help from another person to put on and taking off regular upper body clothing? 2  Help from another person to put on and taking off regular lower body clothing? 1  6 Click Score 12  Progressive Mobility  What is the highest level of mobility based on the progressive mobility assessment? Level 3 (Stands with assist) - Balance while standing  and cannot march in place  Mobility Out of bed to chair with meals  OT Recommendation  Follow Up Recommendations SNF  Individuals Consulted  Consulted and Agree with Results and Recommendations Patient;Family member/caregiver  Acute Rehab OT Goals  Patient Stated Goal family would like her to return to baseline  OT Goal Formulation With patient/family  Time For Goal Achievement 02/04/21  Potential to Achieve Goals Good  OT Time Calculation  OT Start Time (ACUTE ONLY) 1420  OT Stop Time (ACUTE ONLY) 1458  OT Time Calculation (min) 38 min  OT General Charges  $OT Visit 1 Visit  OT Evaluation  $OT Eval Moderate Complexity 1 Mod  OT Treatments  $Self Care/Home Management  8-22 mins  Written Expression  Dominant Hand Right  Derrek Gu OT/L

## 2021-01-21 NOTE — Progress Notes (Signed)
[facility] Progress Note  Patient: [name]    UYQ:[id number]  DOA: 01/18/2021     Date of Service: the patient was seen and examined on 01/21/2021  Brief hospital course: Past medical history of chronic lower extremity lymphedema, anemia, migraine, OSA, restless leg syndrome, seizure disorder.  Presents with complaints of confusion and diarrhea.  Currently plan is monitor for improvement in mentation.  The patient actually has hallucinations.  Subjective: Patient remains confused.  Tells me that there are 3 person in the room.  She does not have any auditory hallucination.  No nausea no vomiting but no fever no chills.  No acute events overnight.  Assessment and Plan: 1.  Acute kidney injury In the setting of diarrhea. Baseline serum creatinine 1.0.  On admission 3.09.  BUN 93. Currently serum creatinine back to 1.06.  BUN 46. Holding diuretics as well as ARB. Continuing IV fluids gently for now.   2.  Diarrhea Etiology not clear. C. difficile negative. Continue Imodium. Improving.  Monitor.   3.  Acute metabolic encephalopathy Hallucination Likely combination of polypharmacy as well as AKI with uremia. Will hold gabapentin reduce the dose of pramipexole and monitor. Recently admitted, CTA head negative for acute abnormality. EEG was also performed which was also negative. Will check TSH free T4.  Check ammonia level.  Check RPR. Neurology was consulted during last admission.  Recheck EEG.   4.  History of rosacea On doxycycline.  Given ongoing diarrhea will hold it.   5.  Sleep apnea Continue CPAP nightly.   6.  Chronic anemia H&H stable. Monitor.  7.  Seizure disorder. Patient is on phenobarbital.  We will continue.  8.  Morbid obesity. OSA. Continue CPAP.  Body mass index is 49.36 kg/m.    Pressure Injury 01/09/21 Buttocks Right Stage 2 -  Partial thickness loss of dermis presenting as a shallow open injury with a red, pink wound bed without  slough. (Active)  01/09/21 1300  Location: Buttocks  Location Orientation: Right  Staging: Stage 2 -  Partial thickness loss of dermis presenting as a shallow open injury with a red, pink wound bed without slough.  Wound Description (Comments):   Present on Admission: Yes     Pressure Injury 01/19/21 Thigh Anterior;Right Deep Tissue Pressure Injury - Purple or maroon localized area of discolored intact skin or blood-filled blister due to damage of underlying soft tissue from pressure and/or shear. Linear DTI (Active)  01/19/21 1936  Location: Thigh  Location Orientation: Anterior;Right  Staging: Deep Tissue Pressure Injury - Purple or maroon localized area of discolored intact skin or blood-filled blister due to damage of underlying soft tissue from pressure and/or shear.  Wound Description (Comments): Linear DTI  Present on Admission: Yes     DVT Prophylaxis:   heparin injection 5,000 Units Start: 01/19/21 0600    Pt is Full code.  Communication: no family was present at bedside, at the time of interview.   Data Reviewed: I have personally reviewed and interpreted daily labs, tele strips, imaging. Creatinine 1.06.  BUN 46.  Sodium 137.  Physical Exam:  General: Appear in mild distress, no Rash; Oral Mucosa Clear, moist. no Abnormal Neck Mass Or lumps, Conjunctiva normal  Cardiovascular: S1 and S2 Present, no Murmur, Respiratory: good respiratory effort, Bilateral Air entry present and CTA, no Crackles, no wheezes Abdomen: Bowel Sound present, Soft and no tenderness Extremities: no Pedal edema Neurology: alert and oriented to time, place, and person affect appropriate. no new focal  deficit Gait not checked due to patient safety concerns  Vitals:   01/20/21 2239 01/21/21 0417 01/21/21 0907 01/21/21 1711  BP:  (!) 109/52 (!) 112/53 (!) 112/54  Pulse: 78 71 71 74  Resp: 18 17 18 17   Temp:  98.3 F (36.8 C) 98.4 F (36.9 C) 98.3 F (36.8 C)  TempSrc:    Oral  SpO2: 96%  95% 93% 98%  Weight:      Height:        Disposition:  Status is: Inpatient  Remains inpatient appropriate because:Altered mental status  Dispo: The patient is from: Home              Anticipated d/c is to: SNF              Patient currently is not medically stable to d/c.   Difficult to place patient No        Time spent: 35 minutes. I reviewed all nursing notes, pharmacy notes, vitals, pertinent old records. I have discussed plan of care as described above with RN.  Author: , MD [facility] 01/21/2021 9:09 PM  To reach On-call, see care teams to locate the attending and reach out via [email or web address].01/23/2021. Between 7PM-7AM, please contact night-coverage If you still have difficulty reaching the attending provider, please page the [facility] (Director on Call) for [facility] on amion for assistance.

## 2021-01-22 ENCOUNTER — Inpatient Hospital Stay (HOSPITAL_COMMUNITY)
Admit: 2021-01-22 | Discharge: 2021-01-22 | Disposition: A | Discharge status: Home / Self Care | Payer: Medicare PPO | Attending: Internal Medicine | Admitting: Internal Medicine

## 2021-01-22 DIAGNOSIS — G4733 Obstructive sleep apnea (adult) (pediatric): Secondary | ICD-10-CM

## 2021-01-22 DIAGNOSIS — N179 Acute kidney failure, unspecified: Secondary | ICD-10-CM | POA: Diagnosis not present

## 2021-01-22 DIAGNOSIS — I10 Essential (primary) hypertension: Secondary | ICD-10-CM

## 2021-01-22 DIAGNOSIS — G40909 Epilepsy, unspecified, not intractable, without status epilepticus: Secondary | ICD-10-CM

## 2021-01-22 DIAGNOSIS — G9341 Metabolic encephalopathy: Secondary | ICD-10-CM

## 2021-01-22 DIAGNOSIS — G2581 Restless legs syndrome: Secondary | ICD-10-CM | POA: Diagnosis not present

## 2021-01-22 DIAGNOSIS — E878 Other disorders of electrolyte and fluid balance, not elsewhere classified: Secondary | ICD-10-CM

## 2021-01-22 DIAGNOSIS — Z9989 Dependence on other enabling machines and devices: Secondary | ICD-10-CM

## 2021-01-22 LAB — RPR: RPR Ser Ql: NONREACTIVE

## 2021-01-22 LAB — COMPREHENSIVE METABOLIC PANEL
ALT: 35 U/L (ref 0–44)
AST: 31 U/L (ref 15–41)
Albumin: 2.5 g/dL — ABNORMAL LOW (ref 3.5–5.0)
Alkaline Phosphatase: 81 U/L (ref 38–126)
Anion gap: 13 (ref 5–15)
BUN: 16 mg/dL (ref 8–23)
CO2: 23 mmol/L (ref 22–32)
Calcium: 9.4 mg/dL (ref 8.9–10.3)
Chloride: 101 mmol/L (ref 98–111)
Creatinine, Ser: 0.8 mg/dL (ref 0.44–1.00)
GFR, Estimated: >60 mL/min (ref >60)
Glucose, Bld: 93 mg/dL (ref 70–99)
Potassium: 3.2 mmol/L — ABNORMAL LOW (ref 3.5–5.1)
Sodium: 137 mmol/L (ref 135–145)
Total Bilirubin: 0.4 mg/dL (ref 0.3–1.2)
Total Protein: 6.6 g/dL (ref 6.5–8.1)

## 2021-01-22 LAB — CBC WITH DIFFERENTIAL/PLATELET
Abs Immature Granulocytes: 0.1 10*3/uL — ABNORMAL HIGH (ref 0.00–0.07)
Basophils Absolute: 0 10*3/uL (ref 0.0–0.1)
Basophils Relative: 0 %
Eosinophils Absolute: 0.1 10*3/uL (ref 0.0–0.5)
Eosinophils Relative: 1 %
HCT: 31.4 % — ABNORMAL LOW (ref 36.0–46.0)
Hemoglobin: 10.6 g/dL — ABNORMAL LOW (ref 12.0–15.0)
Immature Granulocytes: 1 %
Lymphocytes Relative: 21 %
Lymphs Abs: 2.1 10*3/uL (ref 0.7–4.0)
MCH: 32.4 pg (ref 26.0–34.0)
MCHC: 33.8 g/dL (ref 30.0–36.0)
MCV: 96 fL (ref 80.0–100.0)
Monocytes Absolute: 0.9 10*3/uL (ref 0.1–1.0)
Monocytes Relative: 9 %
Neutro Abs: 6.8 10*3/uL (ref 1.7–7.7)
Neutrophils Relative %: 68 %
Platelets: 370 10*3/uL (ref 150–400)
RBC: 3.27 MIL/uL — ABNORMAL LOW (ref 3.87–5.11)
RDW: 13 % (ref 11.5–15.5)
WBC: 10.1 10*3/uL (ref 4.0–10.5)
nRBC: 0 % (ref 0.0–0.2)

## 2021-01-22 LAB — HIV ANTIBODY (ROUTINE TESTING W REFLEX): HIV Screen 4th Generation wRfx: NONREACTIVE

## 2021-01-22 LAB — PHENOBARBITAL LEVEL: Phenobarbital: 19.7 ug/mL (ref 15.0–30.0)

## 2021-01-22 LAB — TSH: TSH: 3.278 u[IU]/mL (ref 0.350–4.500)

## 2021-01-22 LAB — SEDIMENTATION RATE: Sed Rate: >140 mm/hr — ABNORMAL HIGH (ref 0–22)

## 2021-01-22 LAB — C-REACTIVE PROTEIN: CRP: 7.9 mg/dL — ABNORMAL HIGH (ref <1.0)

## 2021-01-22 LAB — T4, FREE: Free T4: 1.04 ng/dL (ref 0.61–1.12)

## 2021-01-22 LAB — AMMONIA: Ammonia: 27 umol/L (ref 9–35)

## 2021-01-22 MED ORDER — POTASSIUM CHLORIDE CRYS ER 20 MEQ PO TBCR
40.0000 meq | EXTENDED_RELEASE_TABLET | Freq: Once | ORAL | Status: AC
  Administered 2021-01-22: 40 meq via ORAL
  Filled 2021-01-22: qty 2

## 2021-01-22 NOTE — Procedures (Signed)
Patient Name: [name]  MRN: [id number]  Epilepsy Attending: Charlsie Quest  Referring Physician/Provider: Dr Lynden Oxford Date: 01/22/2021 Duration: 26.35 mins  Patient history: 70 year old female with altered mental status.  EEG to evaluate for seizures.  Level of alertness: Awake  AEDs during EEG study: Phenobarbital  Technical aspects: This EEG study was done with scalp electrodes positioned according to the 10-20 International system of electrode placement. Electrical activity was acquired at a sampling rate of 500Hz  and reviewed with a high frequency filter of 70Hz  and a low frequency filter of 1Hz . EEG data were recorded continuously and digitally stored.   Description: No clear posterior dominant rhythm was seen.  EEG showed near continuous generalized 5 to 7 Hz theta as well as intermittent generalized 2 to 3 Hz delta slowing.  Hyperventilation and photic stimulation were not performed.     ABNORMALITY - Continuous slow, generalized  IMPRESSION: This study is suggestive of moderate diffuse encephalopathy, nonspecific etiology. No seizures or epileptiform discharges were seen throughout the recording.  Akeylah Hendel 

## 2021-01-22 NOTE — Progress Notes (Signed)
EEG done at bedside. Results pending.  

## 2021-01-22 NOTE — Progress Notes (Signed)
EEG Tech arrived at room. Patient not present, had been taken for another procedure earlier. Will retry to do EEG after her return.

## 2021-01-22 NOTE — Care Management Important Message (Signed)
Important Message  Patient Details  Name: [name] MRN: [id number] Date of Birth: 1950/10/07   Medicare Important Message Given:        [name] 01/22/2021, 4:21 PM

## 2021-01-22 NOTE — Hospital Course (Signed)
  PROGRESS NOTE  [name] [id number]:[id number] DOB: May 07, 1951 DOA: 01/18/2021 PCP: [name], MD  Brief History   Past medical history of chronic lower extremity lymphedema, anemia, migraine, OSA, restless leg syndrome, seizure disorder.  Presents with complaints of confusion and diarrhea.   Currently plan is monitor for improvement in mentation.  The patient actually has hallucinations and continues to require a companion.   A & P  ***  ***  DVT prophylaxis: *** Code Status: *** Family Communication: *** Disposition Plan: ***    Karlynn Furrow, DO [facility] Direct contact: see [email or web address]  7PM-7AM contact night coverage as above 01/22/2021, 6:05 PM  LOS: 3 days   Consultants  ***  Procedures  ***  Antibiotics  ***  Interval History/Subjective  ***  Objective   Vitals:  Vitals:   01/22/21 0810 01/22/21 1623  BP: (!) 128/44 (!) 110/42  Pulse: 76 67  Resp: 18 18  Temp: 97.8 F (36.6 C) 98.3 F (36.8 C)  SpO2: 100% 96%    Exam:  Constitutional:  Appears calm and comfortable Eyes:  pupils and irises appear normal Normal lids and conjunctivae ENMT:  grossly normal hearing  Lips appear normal external ears, nose appear normal Oropharynx: mucosa, tongue,posterior pharynx appear normal Neck:  neck appears normal, no masses, normal ROM, supple no thyromegaly Respiratory:  CTA bilaterally, no w/r/r.  Respiratory effort normal. No retractions or accessory muscle use Cardiovascular:  RRR, no m/r/g No LE extremity edema   Normal pedal pulses Abdomen:  Abdomen appears normal; no tenderness or masses No hernias No HSM Musculoskeletal:  Digits/nails BUE: no clubbing, cyanosis, petechiae, infection exam of joints, bones, muscles of at least one of following: head/neck, RUE, LUE, RLE, LLE   strength and tone normal, no atrophy, no abnormal movements No tenderness, masses Normal ROM, no contractures  gait and station Skin:  No rashes,  lesions, ulcers palpation of skin: no induration or nodules Neurologic:  CN 2-12 intact Sensation all 4 extremities intact Psychiatric:  Mental status Mood, affect appropriate Orientation to person, place, time  judgment and insight appear intact  ***   I have personally reviewed the following:   Today's Data  ***  Lab Data  ***  Micro Data  ***  Imaging  ***  Cardiology Data  ***  Other Data  ***  Scheduled Meds:  aspirin EC  81 mg Oral BID   heparin  5,000 Units Subcutaneous Q8H   montelukast  10 mg Oral QHS   PHENobarbital  129.6 mg Oral QHS   pramipexole  0.5 mg Oral QPM   rosuvastatin  20 mg Oral Daily   saccharomyces boulardii  250 mg Oral BID   Continuous Infusions:  sodium chloride 50 mL/hr at 01/22/21 1128   thiamine injection 500 mg (01/22/21 1135)    Principal Problem:   ARF (acute renal failure) (HCC) Active Problems:   Restless legs syndrome (RLS)   Seizure disorder (HCC)   Essential hypertension   OSA on CPAP   Acute renal failure (ARF) (HCC)   LOS: 3 days

## 2021-01-22 NOTE — Progress Notes (Signed)
PROGRESS NOTE  [name] [id number] DOB: January 01, 1951 DOA: 01/18/2021 PCP: [name], MD  Brief History   Past medical history of chronic lower extremity lymphedema, anemia, migraine, OSA, restless leg syndrome, seizure disorder.  Presents with complaints of confusion and diarrhea.   Currently plan is monitor for improvement in mentation.  The patient actually has hallucinations.  Consultants  Neurology Wound care  Procedures  EEG - 01/22/2021: Moderate diffuse encephalopathy without specific etiology  Antibiotics   Anti-infectives (From admission, onward)    Start     Dose/Rate Route Frequency Ordered Stop   01/19/21 1000  doxycycline (VIBRAMYCIN) 50 MG capsule 50 mg  Status:  Discontinued        50 mg Oral Daily 01/19/21 0256 01/20/21 1310       Subjective  Patient is awake and cooperative. She is confused. No new complaints.  Objective   Vitals:  Vitals:   01/22/21 0810 01/22/21 1623  BP: (!) 128/44 (!) 110/42  Pulse: 76 67  Resp: 18 18  Temp: 97.8 F (36.6 C) 98.3 F (36.8 C)  SpO2: 100% 96%    Exam:  Constitutional:  Awake and alert. No acute distress. Confused. Respiratory:  CTA bilaterally, no w/r/r.  Respiratory effort normal. No retractions or accessory muscle use Cardiovascular:  RRR, no m/r/g No LE extremity edema   Normal pedal pulses Abdomen:  Abdomen appears normal; no tenderness or masses No hernias No HSM Musculoskeletal:  Digits/nails BUE: no clubbing, cyanosis, petechiae, infection exam of joints, bones, muscles of at least one of following: head/neck, RUE, LUE, RLE, LLE   Skin:  No rashes, lesions, ulcers palpation of skin: no induration or nodules Neurologic:  CN 2-12 intact Psychiatric:  Mental status: Confused. Mood, affect congruent Orientation to person  I have personally reviewed the following:   Today's Data  Vitals  Lab Data  CMP CBC CRP  Imaging  CTA head and neck  Cardiology Data   EKG  Scheduled Meds:  aspirin EC  81 mg Oral BID   heparin  5,000 Units Subcutaneous Q8H   montelukast  10 mg Oral QHS   PHENobarbital  129.6 mg Oral QHS   pramipexole  0.5 mg Oral QPM   rosuvastatin  20 mg Oral Daily   saccharomyces boulardii  250 mg Oral BID   Continuous Infusions:  sodium chloride 50 mL/hr at 01/22/21 1128   thiamine injection 500 mg (01/22/21 1135)    Principal Problem:   ARF (acute renal failure) (HCC) Active Problems:   Restless legs syndrome (RLS)   Seizure disorder (HCC)   Essential hypertension   OSA on CPAP   Acute renal failure (ARF) (HCC)   LOS: 3 days   A & P  1.  Acute kidney injury In the setting of diarrhea. Baseline serum creatinine 1.0.  On admission 3.09.  BUN 93. Currently serum creatinine back to 1.06.  BUN 46. Holding diuretics as well as ARB. Continuing IV fluids gently for now.   2.  Diarrhea Etiology not clear. C. difficile negative. Continue Imodium. Improving.  Monitor.   3.  Acute metabolic encephalopathy Hallucination Likely combination of polypharmacy as well as AKI with uremia. Will hold gabapentin reduce the dose of pramipexole and monitor. Recently admitted, CTA head negative for acute abnormality. EEG was also performed which was also negative. Will check TSH free T4.  Check ammonia level.  Check RPR. Neurology was consulted during last admission.  Recheck EEG.   4.  History of rosacea On doxycycline.  Given ongoing diarrhea will hold it.   5.  Sleep apnea Continue CPAP nightly.   6.  Chronic anemia H&H stable. Monitor.   7.  Seizure disorder. Patient is on phenobarbital.  We will continue.   8.  Morbid obesity. OSA. Continue CPAP.   Body mass index is 49.36 kg/m.  Pressure Injury 01/09/21 Buttocks Right Stage 2 -  Partial thickness loss of dermis presenting as a shallow open injury with a red, pink wound bed without slough. (Active)  01/09/21 1300  Location: Buttocks  Location Orientation:  Right  Staging: Stage 2 -  Partial thickness loss of dermis presenting as a shallow open injury with a red, pink wound bed without slough.  Wound Description (Comments):   Present on Admission: Yes     Pressure Injury 01/19/21 Thigh Anterior;Right Deep Tissue Pressure Injury - Purple or maroon localized area of discolored intact skin or blood-filled blister due to damage of underlying soft tissue from pressure and/or shear. Linear DTI (Active)  01/19/21 1936  Location: Thigh  Location Orientation: Anterior;Right  Staging: Deep Tissue Pressure Injury - Purple or maroon localized area of discolored intact skin or blood-filled blister due to damage of underlying soft tissue from pressure and/or shear.  Wound Description (Comments): Linear DTI  Present on Admission: Yes    Inpatient status.  DVT prophylaxis: heparin subcutaneous Code Status: Full Code Family Communication: None available Disposition Plan: Recommendation per PT/OT is CIR vs 24 hours supervision.   Catherin Doorn, DO [facility] Direct contact: see [email or web address]  7PM-7AM contact night coverage as above 01/22/2021, 6:08 PM  LOS: 3 days

## 2021-01-23 DIAGNOSIS — G4733 Obstructive sleep apnea (adult) (pediatric): Secondary | ICD-10-CM | POA: Diagnosis not present

## 2021-01-23 DIAGNOSIS — N179 Acute kidney failure, unspecified: Secondary | ICD-10-CM | POA: Diagnosis not present

## 2021-01-23 DIAGNOSIS — G2581 Restless legs syndrome: Secondary | ICD-10-CM | POA: Diagnosis not present

## 2021-01-23 DIAGNOSIS — I10 Essential (primary) hypertension: Secondary | ICD-10-CM | POA: Diagnosis not present

## 2021-01-23 LAB — CBC WITH DIFFERENTIAL/PLATELET
Abs Immature Granulocytes: 0.09 10*3/uL — ABNORMAL HIGH (ref 0.00–0.07)
Basophils Absolute: 0.1 10*3/uL (ref 0.0–0.1)
Basophils Relative: 1 %
Eosinophils Absolute: 0.1 10*3/uL (ref 0.0–0.5)
Eosinophils Relative: 2 %
HCT: 30.5 % — ABNORMAL LOW (ref 36.0–46.0)
Hemoglobin: 10.2 g/dL — ABNORMAL LOW (ref 12.0–15.0)
Immature Granulocytes: 1 %
Lymphocytes Relative: 22 %
Lymphs Abs: 2 10*3/uL (ref 0.7–4.0)
MCH: 32.1 pg (ref 26.0–34.0)
MCHC: 33.4 g/dL (ref 30.0–36.0)
MCV: 95.9 fL (ref 80.0–100.0)
Monocytes Absolute: 0.8 10*3/uL (ref 0.1–1.0)
Monocytes Relative: 8 %
Neutro Abs: 6.1 10*3/uL (ref 1.7–7.7)
Neutrophils Relative %: 66 %
Platelets: 305 10*3/uL (ref 150–400)
RBC: 3.18 MIL/uL — ABNORMAL LOW (ref 3.87–5.11)
RDW: 13 % (ref 11.5–15.5)
WBC: 9.2 10*3/uL (ref 4.0–10.5)
nRBC: 0 % (ref 0.0–0.2)

## 2021-01-23 LAB — COMPREHENSIVE METABOLIC PANEL
ALT: 33 U/L (ref 0–44)
AST: 29 U/L (ref 15–41)
Albumin: 2.6 g/dL — ABNORMAL LOW (ref 3.5–5.0)
Alkaline Phosphatase: 79 U/L (ref 38–126)
Anion gap: 7 (ref 5–15)
BUN: 9 mg/dL (ref 8–23)
CO2: 23 mmol/L (ref 22–32)
Calcium: 9 mg/dL (ref 8.9–10.3)
Chloride: 107 mmol/L (ref 98–111)
Creatinine, Ser: 0.8 mg/dL (ref 0.44–1.00)
GFR, Estimated: >60 mL/min (ref >60)
Glucose, Bld: 95 mg/dL (ref 70–99)
Potassium: 4 mmol/L (ref 3.5–5.1)
Sodium: 137 mmol/L (ref 135–145)
Total Bilirubin: 0.5 mg/dL (ref 0.3–1.2)
Total Protein: 6.7 g/dL (ref 6.5–8.1)

## 2021-01-23 MED ORDER — LOPERAMIDE HCL 2 MG PO CAPS
4.0000 mg | ORAL_CAPSULE | Freq: Every day | ORAL | Status: DC
  Administered 2021-01-23 – 2021-01-25 (×3): 4 mg via ORAL
  Filled 2021-01-23 (×3): qty 2

## 2021-01-23 MED ORDER — LOPERAMIDE HCL 2 MG PO CAPS: 2.0000 mg | ORAL_CAPSULE | ORAL | Status: DC | PRN

## 2021-01-23 NOTE — Progress Notes (Signed)
Physical Therapy Treatment Patient Details Name: [name] MRN: [id number] DOB: Sep 28, 1950 Today's Date: 01/23/2021   History of Present Illness Past medical history of chronic lower extremity lymphedema, anemia, migraine, OSA, restless leg syndrome, seizure disorder.  Presents with complaints of confusion and diarrhea.  Currently plan is treat AKI with IV fluids and monitor for improvement in diarrhea and mentation.    PT Comments    Pt continues to demonstrate significant functional weakness, unable to stand with PT assistance and knee block. Pt also requires significant physical assistance to perform all bed mobility. Pt remains very confused and demonstrates poor awareness of deficits during session. Pt will continue to benefit from acute PT POC to improve mobility quality and reduce falls risk.   Recommendations for follow up therapy are one component of a multi-disciplinary discharge planning process, led by the attending physician.  Recommendations may be updated based on patient status, additional functional criteria and insurance authorization.  Follow Up Recommendations  Supervision/Assistance - 24 hour;CIR     Equipment Recommendations  Wheelchair (measurements PT);Wheelchair cushion (measurements PT)    Recommendations for Other Services       Precautions / Restrictions Precautions Precautions: Fall Precaution Comments: pain in RLE Restrictions Weight Bearing Restrictions: No     Mobility  Bed Mobility Overal bed mobility: Needs Assistance Bed Mobility: Rolling;Supine to Sit;Sit to Supine Rolling: Mod assist   Supine to sit: Mod assist;HOB elevated Sit to supine: Max assist        Transfers Overall transfer level: Needs assistance Equipment used: 1 person hand held assist Transfers: Sit to/from Stand Sit to Stand: From elevated surface (unable to clear buttocks from bed with maxA from PT)            Ambulation/Gait                  Stairs             Wheelchair Mobility    Modified Rankin (Stroke Patients Only)       Balance Overall balance assessment: Needs assistance Sitting-balance support: No upper extremity supported;Feet supported Sitting balance-Leahy Scale: Fair                                      Cognition Arousal/Alertness: Awake/alert Behavior During Therapy: WFL for tasks assessed/performed Overall Cognitive Status: Impaired/Different from baseline Area of Impairment: Orientation;Attention;Memory;Following commands;Safety/judgement;Awareness;Problem solving                 Orientation Level: Disoriented to;Situation;Time Current Attention Level: Focused Memory: Decreased recall of precautions;Decreased short-term memory Following Commands: Follows one step commands consistently Safety/Judgement: Decreased awareness of safety;Decreased awareness of deficits Awareness: Intellectual Problem Solving: Slow processing;Difficulty sequencing;Requires verbal cues;Requires tactile cues        Exercises      General Comments General comments (skin integrity, edema, etc.): VSS on RA, pt incontinent of stool and urine upon arrival, unaware of stool incontinence. Pt with posterior R leg skin tear, RN made aware      Pertinent Vitals/Pain Pain Assessment: Faces Faces Pain Scale: Hurts whole lot Pain Location: RLE Pain Descriptors / Indicators: Sore Pain Intervention(s): Monitored during session    Home Living                      Prior Function            PT Goals (current goals can  now be found in the care plan section) Acute Rehab PT Goals Patient Stated Goal: to get back home Progress towards PT goals: Not progressing toward goals - comment (limited by LE pain and weakness)    Frequency    Min 2X/week      PT Plan Current plan remains appropriate    Co-evaluation              AM-PAC PT "6 Clicks" Mobility   Outcome Measure   Help needed turning from your back to your side while in a flat bed without using bedrails?: A Lot Help needed moving from lying on your back to sitting on the side of a flat bed without using bedrails?: A Lot Help needed moving to and from a bed to a chair (including a wheelchair)?: Total Help needed standing up from a chair using your arms (e.g., wheelchair or bedside chair)?: Total Help needed to walk in hospital room?: Total Help needed climbing 3-5 steps with a railing? : Total 6 Click Score: 8    End of Session   Activity Tolerance: Patient tolerated treatment well Patient left: in bed;with call bell/phone within reach;with bed alarm set Nurse Communication: Mobility status;Need for lift equipment PT Visit Diagnosis: Muscle weakness (generalized) (M62.81);Other abnormalities of gait and mobility (R26.89);Other symptoms and signs involving the nervous system (R29.898) Pain - Right/Left: Right Pain - part of body: Ankle and joints of foot     Time: 8242-3536 PT Time Calculation (min) (ACUTE ONLY): 41 min  Charges:  $Therapeutic Activity: 38-52 mins                     Arlyss Gandy, PT, DPT Acute Rehabilitation Pager: [phone]    Arlyss Gandy 01/23/2021, 11:48 AM

## 2021-01-23 NOTE — Care Management Important Message (Signed)
Important Message  Patient Details  Name: [name] MRN: [id number] Date of Birth: 1950/06/10   Medicare Important Message Given:  Yes     [name] Stefan Church 01/23/2021, 9:09 AM

## 2021-01-23 NOTE — Plan of Care (Signed)
  Problem: Education: Goal: Knowledge of General Education information will improve Description: Including pain rating scale, medication(s)/side effects and non-pharmacologic comfort measures Outcome: Progressing   Problem: Clinical Measurements: Goal: Will remain free from infection Outcome: Progressing   Problem: Activity: Goal: Risk for activity intolerance will decrease Outcome: Progressing   

## 2021-01-23 NOTE — Progress Notes (Signed)
PROGRESS NOTE  PA TENNANT EVO:[id number] DOB: [birth year] DOA: 01/18/2021 PCP: [name], MD  Brief History   Past medical history of chronic lower extremity lymphedema, anemia, migraine, OSA, restless leg syndrome, seizure disorder.  Presents with complaints of confusion and diarrhea.   Mentation appears improved. The patient continues to have diarrhea. She has been started on imodium.   Consultants  Neurology Wound Care  Procedures  EEG - 01/22/2021: Moderate diffuse encephalopathy without specific etiology  Antibiotics   Anti-infectives (From admission, onward)    Start     Dose/Rate Route Frequency Ordered Stop   01/19/21 1000  doxycycline (VIBRAMYCIN) 50 MG capsule 50 mg  Status:  Discontinued        50 mg Oral Daily 01/19/21 0256 01/20/21 1310        Subjective  The patient is awake and alert. Seems less confused than yesterday. No new complaints.  Objective   Vitals:  Vitals:   01/23/21 0354 01/23/21 1043  BP: (!) 125/55 (!) 129/51  Pulse: 67 76  Resp: 16 18  Temp: 98.1 F (36.7 C) 97.9 F (36.6 C)  SpO2: 97% 98%    Exam:   Constitutional:  Awake and alert. No acute distress. Confused. Respiratory:  CTA bilaterally, no w/r/r.  Respiratory effort normal. No retractions or accessory muscle use Cardiovascular:  RRR, no m/r/g No LE extremity edema   Normal pedal pulses Abdomen:  Abdomen appears normal; no tenderness or masses No hernias No HSM Musculoskeletal:  Digits/nails BUE: no clubbing, cyanosis, petechiae, infection exam of joints, bones, muscles of at least one of following: head/neck, RUE, LUE, RLE, LLE   Skin:  No rashes, lesions, ulcers palpation of skin: no induration or nodules Neurologic:  CN 2-12 intact Psychiatric:  Mental status: Confused. Mood, affect congruent Orientation to person  I have personally reviewed the following:   Today's Data  Vitals  Lab Data  CBC CMP Phenobarbital level  Imaging  CTA head  and neck  Cardiology Data  EKG  Scheduled Meds:  aspirin EC  81 mg Oral BID   heparin  5,000 Units Subcutaneous Q8H   loperamide  4 mg Oral Q breakfast   montelukast  10 mg Oral QHS   PHENobarbital  129.6 mg Oral QHS   pramipexole  0.5 mg Oral QPM   rosuvastatin  20 mg Oral Daily   saccharomyces boulardii  250 mg Oral BID   Continuous Infusions:  sodium chloride 50 mL/hr at 01/23/21 1040   thiamine injection 500 mg (01/23/21 1034)    Principal Problem:   ARF (acute renal failure) (HCC) Active Problems:   Restless legs syndrome (RLS)   Seizure disorder (HCC)   Essential hypertension   Bilateral lower extremity edema   OSA on CPAP   Morbid obesity due to excess calories (HCC)   LOS: 4 days  A & P  1.  Acute kidney injury In the setting of diarrhea. Baseline serum creatinine 1.0.  On admission 3.09.  BUN 93. Currently serum creatinine back to 0.80  BUN 9 Holding diuretics as well as ARB. IV fluids stopped.   2.  Diarrhea Etiology not clear. 6-7 BM's yesterday. C. difficile negative. Imodium ordered.   3.  Acute metabolic encephalopathy Hallucination Resolving. Likely combination of polypharmacy as well as AKI with uremia. Will hold gabapentin reduce the dose of pramipexole and monitor. Recently admitted, CTA head negative for acute abnormality. EEG was also performed which was also negative. TSH free T4 were within normal limits.  RPR non-reactive. Neurology was consulted during last admission.  EEG demonstrated diffuse slowing consistent with encephalopathy   4.  History of rosacea On doxycycline.  Given ongoing diarrhea will hold it.   5.  Sleep apnea Continue CPAP nightly.   6.  Chronic anemia H&H stable. Monitor.   7.  Seizure disorder. Patient is on phenobarbital.  Level is within normal range. Continue.   8.  Morbid obesity. OSA. Continue CPAP.   Body mass index is 49.36 kg/m.  Pressure Injury 01/09/21 Buttocks Right Stage 2 -  Partial  thickness loss of dermis presenting as a shallow open injury with a red, pink wound bed without slough. (Active)  01/09/21 1300  Location: Buttocks  Location Orientation: Right  Staging: Stage 2 -  Partial thickness loss of dermis presenting as a shallow open injury with a red, pink wound bed without slough.  Wound Description (Comments):   Present on Admission: Yes     Pressure Injury 01/19/21 Thigh Anterior;Right Deep Tissue Pressure Injury - Purple or maroon localized area of discolored intact skin or blood-filled blister due to damage of underlying soft tissue from pressure and/or shear. Linear DTI (Active)  01/19/21 1936  Location: Thigh  Location Orientation: Anterior;Right  Staging: Deep Tissue Pressure Injury - Purple or maroon localized area of discolored intact skin or blood-filled blister due to damage of underlying soft tissue from pressure and/or shear.  Wound Description (Comments): Linear DTI  Present on Admission: Yes     Inpatient status. I have seen and examined this patient myself. I have spent 32 minutes in her evaluation and care.   DVT prophylaxis: heparin subcutaneous Code Status: Full Code Family Communication: None available Disposition Plan: Recommendation per PT/OT is CIR vs 24 hours supervision.    Lathaniel Legate, DO [facility] Direct contact: see [email or web address]  7PM-7AM contact night coverage as above 01/23/2021, 3:17 PM  LOS: 3 days

## 2021-01-24 DIAGNOSIS — I10 Essential (primary) hypertension: Secondary | ICD-10-CM | POA: Diagnosis not present

## 2021-01-24 DIAGNOSIS — G2581 Restless legs syndrome: Secondary | ICD-10-CM | POA: Diagnosis not present

## 2021-01-24 DIAGNOSIS — G4733 Obstructive sleep apnea (adult) (pediatric): Secondary | ICD-10-CM | POA: Diagnosis not present

## 2021-01-24 DIAGNOSIS — N179 Acute kidney failure, unspecified: Secondary | ICD-10-CM | POA: Diagnosis not present

## 2021-01-24 LAB — VITAMIN B1: Vitamin B1 (Thiamine): 74.4 nmol/L (ref 66.5–200.0)

## 2021-01-24 NOTE — Progress Notes (Signed)
Occupational Therapy Treatment Patient Details Name: [name] MRN: [id number] DOB: 1950-10-11 Today's Date: 01/24/2021   History of present illness Past medical history of chronic lower extremity lymphedema, anemia, migraine, OSA, restless leg syndrome, seizure disorder.  Presents with complaints of confusion and diarrhea.  Currently plan is treat AKI with IV fluids and monitor for improvement in diarrhea and mentation.   OT comments  Patient progressing and showed improved balance while sitting EOB in order to perform grooming with setup and supervision only, compared to previous session. Pt also performed standing from elevated EOB to [name] x 3 reps with Mod As of 1 person to allow for peri hygiene and UE/LE/Trunk strengthening.  Patient remains limited by confusion, RT knee/LE pain, generalized weakness and decreased activity tolerance along with deficits noted below. Pt continues to demonstrate good rehab potential and would benefit from continued skilled OT to increase safety and independence with ADLs and functional transfers to allow pt to return home safely and reduce caregiver burden and fall risk.    Recommendations for follow up therapy are one component of a multi-disciplinary discharge planning process, led by the attending physician.  Recommendations may be updated based on patient status, additional functional criteria and insurance authorization.    Follow Up Recommendations  SNF    Equipment Recommendations  Other (comment) (defer to post acute recommendations)    Recommendations for Other Services      Precautions / Restrictions Precautions Precautions: Fall Precaution Comments: pain in RLE Restrictions Weight Bearing Restrictions: No       Mobility Bed Mobility Overal bed mobility: Needs Assistance Bed Mobility: Supine to Sit;Sit to Supine     Supine to sit: Mod assist;HOB elevated Sit to supine: Mod assist   General bed mobility comments: Pt  performed  supine to sit in her preferred method with Mod As for trunk. Max As to scoot to EOB.  Mod As to LEs to lift back to bed with sit to supine. Pt able to follow instructions for sequencing and hand placement to control trunk.    Transfers Overall transfer level: Needs assistance   Transfers: Sit to/from Stand Sit to Stand: From elevated surface (x 3 reps for hygiene)         General transfer comment: Pt stood to [name] from elevated EOB with Mod As, mutimodal cues and 1-step instructions. Pt given demo of Green on her daughter prior to using to assist with understanding.    Balance Overall balance assessment: Needs assistance Sitting-balance support: No upper extremity supported;Feet supported Sitting balance-[name]: Fair     Standing balance support: Bilateral upper extremity supported Standing balance-[name]: Poor Standing balance comment: Pt stood x 3 on [name] for peri hygiene for which pt required Total Assist of 1 person. Pt able to maintain stand with BUE support on Green bar for up to ~45 seconds which each stand.                           ADL either performed or assessed with clinical judgement   ADL Overall ADL's : Needs assistance/impaired     Grooming: Sitting;Oral care;Supervision/safety;Set up Grooming Details (indicate cue type and reason): Pt sat EOB and performed oral care with setup and close supervision for balance, but no LOB.         Upper Body Dressing : Minimal assistance;Sitting Upper Body Dressing Details (indicate cue type and reason): Pt donned posterior gown while sitting  EOB. Min As and assist for line management as needed. Lower Body Dressing: Total assistance Lower Body Dressing Details (indicate cue type and reason): to don socks at bed level.             Functional mobility during ADLs: Moderate assistance General ADL Comments: Pt mildly confused today, but able to redirect. Moderate assist with bed  mobility and sit to stand to [employer].     Vision Baseline Vision/History: 1 Wears glasses Ability to See in Adequate Light: 0 Adequate Patient Visual Report: No change from baseline     Perception     Praxis      Cognition Arousal/Alertness: Awake/alert Behavior During Therapy: WFL for tasks assessed/performed;Anxious (needs redirection to task/topic.) Overall Cognitive Status: Impaired/Different from baseline Area of Impairment: Attention;Memory;Safety/judgement;Awareness                 Orientation Level: Situation;Time Current Attention Level: Sustained Memory: Decreased recall of precautions;Decreased short-term memory Following Commands: Follows one step commands consistently (with cues as needed to attend) Safety/Judgement: Decreased awareness of safety;Decreased awareness of deficits Awareness: Intellectual Problem Solving: Slow processing;Difficulty sequencing;Requires verbal cues;Requires tactile cues General Comments: coopertive and plesant during session, but and hyper verbal with non-related topics, daughter and spouse in the room. Spouse is correcting pt when pt giving inaccurate history. Pt reports, "I stood yesterday." Pt's spouse correcting that pt has not stood since hospitalization as far as he knows.        Exercises     Shoulder Instructions       General Comments      Pertinent Vitals/ Pain       Pain Assessment: Faces Faces Pain Green: Hurts little more Pain Location: RLE when knee flexed to position [name] Pain Descriptors / Indicators: Sore Pain Intervention(s): Limited activity within patient's tolerance;Monitored during session;Repositioned  Home Living                                          Prior Functioning/Environment              Frequency  Min 2X/week        Progress Toward Goals  OT Goals(current goals can now be found in the care plan section)  Progress towards OT goals: Progressing  toward goals  Acute Rehab OT Goals Patient Stated Goal: To take a shower OT Goal Formulation: With patient/family Time For Goal Achievement: 02/04/21 Potential to Achieve Goals: Good  Plan Discharge plan remains appropriate    Co-evaluation                 AM-PAC OT "6 Clicks" Daily Activity     Outcome Measure   Help from another person eating meals?: None Help from another person taking care of personal grooming?: A Little Help from another person toileting, which includes using toliet, bedpan, or urinal?: Total Help from another person bathing (including washing, rinsing, drying)?: A Lot Help from another person to put on and taking off regular upper body clothing?: A Lot Help from another person to put on and taking off regular lower body clothing?: Total 6 Click Score: 13    End of Session Equipment Utilized During Treatment: Gait belt  OT Visit Diagnosis: Unsteadiness on feet (R26.81);Muscle weakness (generalized) (M62.81);Other symptoms and signs involving cognitive function   Activity Tolerance Patient limited by pain;Patient limited by fatigue   Patient Left in  bed;with call bell/phone within reach;with family/visitor present;with bed alarm set   Nurse Communication  (cleared OT to see pt.)        Time: [id number] OT Time Calculation (min): 55 min  Charges: OT General Charges $OT Visit: 1 Visit OT Treatments $Self Care/Home Management : 23-37 mins $Therapeutic Activity: 23-37 mins  [name], OT Acute Rehab Services Office: [phone] 01/24/2021  Theodoro Clock 01/24/2021, 2:18 PM

## 2021-01-24 NOTE — Progress Notes (Signed)
PROGRESS NOTE  EXA BOMBA WUJ:[id number] DOB: 11/09/1950 DOA: 01/18/2021 PCP: [name], MD  Brief History   Past medical history of chronic lower extremity lymphedema, anemia, migraine, OSA, restless leg syndrome, seizure disorder.  Presents with complaints of confusion and diarrhea.   Mentation appears improved. The patient continues to have diarrhea. She was started on imodium. It has improved.  Consultants  Neurology Wound Care  Procedures  EEG - 01/22/2021: Moderate diffuse encephalopathy without specific etiology  Antibiotics   Anti-infectives (From admission, onward)    Start     Dose/Rate Route Frequency Ordered Stop   01/19/21 1000  doxycycline (VIBRAMYCIN) 50 MG capsule 50 mg  Status:  Discontinued        50 mg Oral Daily 01/19/21 0256 01/20/21 1310        Subjective  The patient is awake and alert. No new complaints. Diarrhea is improved.   Objective   Vitals:  Vitals:   01/24/21 1405 01/24/21 1648  BP:  (!) 141/61  Pulse: (!) 107 69  Resp:  16  Temp:  98.5 F (36.9 C)  SpO2:  97%    Exam:   Constitutional:  Awake and alert. No acute distress. Confused. Respiratory:  CTA bilaterally, no w/r/r.  Respiratory effort normal. No retractions or accessory muscle use Cardiovascular:  RRR, no m/r/g No LE extremity edema   Normal pedal pulses Abdomen:  Abdomen appears normal; no tenderness or masses No hernias No HSM Musculoskeletal:  Digits/nails BUE: no clubbing, cyanosis, petechiae, infection exam of joints, bones, muscles of at least one of following: head/neck, RUE, LUE, RLE, LLE   Skin:  No rashes, lesions, ulcers palpation of skin: no induration or nodules Neurologic:  CN 2-12 intact Psychiatric:  Mental status: Confused. Mood, affect congruent Orientation to person  I have personally reviewed the following:   Today's Data  Vitals  Lab Data  CBC CMP Phenobarbital level  Imaging  CTA head and neck  Cardiology Data   EKG  Scheduled Meds:  aspirin EC  81 mg Oral BID   heparin  5,000 Units Subcutaneous Q8H   loperamide  4 mg Oral Q breakfast   montelukast  10 mg Oral QHS   PHENobarbital  129.6 mg Oral QHS   pramipexole  0.5 mg Oral QPM   rosuvastatin  20 mg Oral Daily   saccharomyces boulardii  250 mg Oral BID   Continuous Infusions:  thiamine injection 500 mg (01/24/21 1259)    Principal Problem:   ARF (acute renal failure) (HCC) Active Problems:   Restless legs syndrome (RLS)   Seizure disorder (HCC)   Essential hypertension   Bilateral lower extremity edema   OSA on CPAP   Morbid obesity due to excess calories (HCC)   LOS: 5 days  A & P  1.  Acute kidney injury In the setting of diarrhea. Baseline serum creatinine 1.0.  On admission 3.09.  BUN 93. Currently serum creatinine back to 0.80  BUN 9 Holding diuretics as well as ARB. IV fluids stopped.   2.  Diarrhea Improved. Etiology not clear.  C. difficile negative. Imodium ordered.   3.  Acute metabolic encephalopathy Hallucination Resolving. Likely combination of polypharmacy as well as AKI with uremia. Will hold gabapentin reduce the dose of pramipexole and monitor. Recently admitted, CTA head negative for acute abnormality. EEG was also performed which was also negative. TSH free T4 were within normal limits.   RPR non-reactive. Neurology was consulted during last admission.  EEG demonstrated diffuse  slowing consistent with encephalopathy.   4.  History of rosacea On doxycycline.  Given ongoing diarrhea will hold it.   5.  Sleep apnea Continue CPAP nightly.   6.  Chronic anemia H&H stable. Monitor.   7.  Seizure disorder. Patient is on phenobarbital.  Level is within normal range. Continue.   8.  Morbid obesity. OSA. Continue CPAP.   Body mass index is 49.36 kg/m.  Pressure Injury 01/09/21 Buttocks Right Stage 2 -  Partial thickness loss of dermis presenting as a shallow open injury with a red, pink wound  bed without slough. (Active)  01/09/21 1300  Location: Buttocks  Location Orientation: Right  Staging: Stage 2 -  Partial thickness loss of dermis presenting as a shallow open injury with a red, pink wound bed without slough.  Wound Description (Comments):   Present on Admission: Yes     Pressure Injury 01/19/21 Thigh Anterior;Right Deep Tissue Pressure Injury - Purple or maroon localized area of discolored intact skin or blood-filled blister due to damage of underlying soft tissue from pressure and/or shear. Linear DTI (Active)  01/19/21 1936  Location: Thigh  Location Orientation: Anterior;Right  Staging: Deep Tissue Pressure Injury - Purple or maroon localized area of discolored intact skin or blood-filled blister due to damage of underlying soft tissue from pressure and/or shear.  Wound Description (Comments): Linear DTI  Present on Admission: Yes     Inpatient status. I have seen and examined this patient myself. I have spent 32 minutes in her evaluation and care.   DVT prophylaxis: heparin subcutaneous Code Status: Full Code Family Communication: None available Disposition Plan: Recommendation per PT/OT is CIR vs 24 hours supervision.    Junita Kubota, DO [facility] Direct contact: see [email or web address]  7PM-7AM contact night coverage as above 01/24/2021, 6:38 PM  LOS: 3 days

## 2021-01-24 NOTE — Progress Notes (Signed)
Inpatient Rehab Admissions Coordinator:   Met with patient at bedside to discuss potential CIR admission. Pt. Stated interest. Will pursue for potential admit next week, pending insurance auth.   , MS, CCC-SLP Rehab Admissions Coordinator  [phone] (celll) [phone] (office)  

## 2021-01-25 DIAGNOSIS — N179 Acute kidney failure, unspecified: Secondary | ICD-10-CM | POA: Diagnosis not present

## 2021-01-25 DIAGNOSIS — I10 Essential (primary) hypertension: Secondary | ICD-10-CM | POA: Diagnosis not present

## 2021-01-25 DIAGNOSIS — R600 Localized edema: Secondary | ICD-10-CM | POA: Diagnosis not present

## 2021-01-25 MED ORDER — POLYETHYLENE GLYCOL 3350 17 G PO PACK
17.0000 g | PACK | Freq: Every day | ORAL | Status: DC
  Administered 2021-01-25 – 2021-01-26 (×2): 17 g via ORAL
  Filled 2021-01-25 (×2): qty 1

## 2021-01-25 MED ORDER — SENNA 8.6 MG PO TABS: 1.0000 | ORAL_TABLET | Freq: Every day | ORAL | Status: DC | PRN

## 2021-01-25 NOTE — Progress Notes (Signed)
Pt has home CPAP at bedside but states she probably will not wear tonight due to nausea and vomiting.

## 2021-01-25 NOTE — Progress Notes (Addendum)
PROGRESS NOTE  [name] PJA:[id number] DOB: March 07, 1951 DOA: 01/18/2021 PCP: [name], MD  HPI/Recap of past 49 hours: 70 year old female with past medical history significant for chronic lower extremity lymphedema right worse than left, anemia, migraines, obstructive sleep apnea, restless leg syndrome, seizure disorder who was admitted for complaints of confusion and diarrhea.  Patient seen and examined at bedside.  She stated that she is doing much better her husband Annette Stable is at bedside.  She stated that her edema on the right side has gone down considerably.  But she is complaining that she is feeling bloated.  She had only 1 small bowel movement today but she is passing gas  Assessment/Plan: Principal Problem:   ARF (acute renal failure) (HCC) Active Problems:   Restless legs syndrome (RLS)   Seizure disorder (HCC)   Essential hypertension   Bilateral lower extremity edema   OSA on CPAP   Morbid obesity due to excess calories (HCC)  1.  Constipation patient had diarrhea and was started on Imodium.  We will start her on MiraLAX and Senokot. Will stop Imodium  2.  Seizure disorder Continue phenobarbital EEG showed marked diffuse slowing consistent with encephalopathy  3.  Morbid obesity Obstructive sleep apnea Continue CPAP  4.  Pressure injury January 19, 2021 anterior right deep tissue pressure injury Present on admission Continue wound care  5.  Chronic anemia H&H stable  6.  History of rosacea on doxycycline But he was held due to diarrhea  7.  Diarrhea.   Unclear etiology C. difficile was negative She was given Imodium now she is complaining of constipation she was given Patient is now complaining of constipation  Acute metabolic encephalopathy resolved Likely combination of polypharmacy as well as AKI with uremia Her gabapentin is on hold CTA of the head was negative EEG was negative showed diffuse slowing consistent with encephalopathy on her  previous admission TSH within normal limit RPR negative   Code Status: Full  Severity of Illness: The appropriate patient status for this patient is INPATIENT. Inpatient status is judged to be reasonable and necessary in order to provide the required intensity of service to ensure the patient's safety. The patient's presenting symptoms, physical exam findings, and initial radiographic and laboratory data in the context of their chronic comorbidities is felt to place them at high risk for further clinical deterioration. Furthermore, it is not anticipated that the patient will be medically stable for discharge from the hospital within 2 midnights of admission. The following factors support the patient status of inpatient. Altered mental status improving  * I certify that at the point of admission it is my clinical judgment that the patient will require inpatient hospital care spanning beyond 2 midnights from the point of admission due to high intensity of service, high risk for further deterioration and high frequency of surveillance required.*   Family Communication: Husband Bill at bedside  Disposition Plan: To be determined Status is: Inpatient   Dispo: The patient is from: Home              Anticipated d/c is to: PT OT recommend CIR versus 24-hour supervision              Anticipated d/c date is: 1 to 2 days              Patient currently not medically stable for discharge  Consultants: Neurology  Procedures: EEG  Antimicrobials: None Was on doxycycline for rosacea but that has been held  DVT prophylaxis: Subacute heparin   Objective: Vitals:   01/24/21 2017 01/24/21 2033 01/25/21 0516 01/25/21 0936  BP: (!) 121/52 (!) 127/45 (!) 124/50 (!) 142/42  Pulse: 73 74 71 68  Resp: 18 16 18 16   Temp: 99.4 F (37.4 C) 99.2 F (37.3 C) 98.2 F (36.8 C) 98.6 F (37 C)  TempSrc:  Oral Oral Oral  SpO2: 94% 97% 96% 98%  Weight:   125.5 kg   Height:        Intake/Output  Summary (Last 24 hours) at 01/25/2021 1007 Last data filed at 01/25/2021 0900 Gross per 24 hour  Intake 527 ml  Output 1650 ml  Net -1123 ml   Filed Weights   01/19/21 1742 01/20/21 2116 01/25/21 0516  Weight: 122.4 kg 122.4 kg 125.5 kg   Body mass index is 50.6 kg/m.  Exam:  General: 70 y.o. year-old female well developed well nourished in no acute distress.  Alert and oriented x3.  Morbidly obese no distress Cardiovascular: Regular rate and rhythm with no rubs or gallops.  No thyromegaly or JVD noted.   Respiratory: Clear to auscultation with no wheezes or rales. Good inspiratory effort. Abdomen: Firm nontender mildly distended with normal bowel sounds x4 quadrants. Musculoskeletal: No lower extremity edema. 2/4 pulses in all 4 extremities. Skin: Patient has ulcer Psychiatry: Mood is appropriate for condition and setting Neurology: Alert oriented x3 Extremity: Right lower extremity foot is deformity is noted and it is skin is quite thickened and puckered and shriveled    Data Reviewed: CBC: Recent Labs  Lab 01/18/21 2112 01/19/21 0411 01/20/21 0929 01/22/21 0330 01/23/21 0350  WBC 12.9* 12.2*  11.7* 9.9 10.1 9.2  NEUTROABS 9.5*  --   --  6.8 6.1  HGB 12.5 11.1*  11.3* 10.0* 10.6* 10.2*  HCT 39.2 35.1*  34.8* 31.0* 31.4* 30.5*  MCV 100.8* 100.3*  98.6 98.4 96.0 95.9  PLT 464* 467*  423* 442* 370 305   Basic Metabolic Panel: Recent Labs  Lab 01/18/21 2112 01/19/21 0411 01/20/21 0929 01/22/21 0330 01/23/21 0350  NA 131* 132* 137 137 137  K 5.2* 4.6 4.1 3.2* 4.0  CL 93* 95* 104 101 107  CO2 24 23 24 23 23   GLUCOSE 119* 108* 93 93 95  BUN 93* 93* 46* 16 9  CREATININE 3.09* 2.56*  2.51* 1.06* 0.80 0.80  CALCIUM 9.8 9.6 9.9 9.4 9.0  MG  --   --  2.2  --   --    GFR: Estimated Creatinine Clearance: 84.1 mL/min (by C-G formula based on SCr of 0.8 mg/dL). Liver Function Tests: Recent Labs  Lab 01/22/21 0330 01/23/21 0350  AST 31 29  ALT 35 33   ALKPHOS 81 79  BILITOT 0.4 0.5  PROT 6.6 6.7  ALBUMIN 2.5* 2.6*   No results for input(s): LIPASE, AMYLASE in the last 168 hours. Recent Labs  Lab 01/22/21 0330  AMMONIA 27   Coagulation Profile: No results for input(s): INR, PROTIME in the last 168 hours. Cardiac Enzymes: No results for input(s): CKTOTAL, CKMB, CKMBINDEX, TROPONINI in the last 168 hours. BNP (last 3 results) No results for input(s): PROBNP in the last 8760 hours. HbA1C: No results for input(s): HGBA1C in the last 72 hours. CBG: No results for input(s): GLUCAP in the last 168 hours. Lipid Profile: No results for input(s): CHOL, HDL, LDLCALC, TRIG, CHOLHDL, LDLDIRECT in the last 72 hours. Thyroid Function Tests: No results for input(s): TSH, T4TOTAL, FREET4, T3FREE, THYROIDAB in the last  72 hours. Anemia Panel: No results for input(s): VITAMINB12, FOLATE, FERRITIN, TIBC, IRON, RETICCTPCT in the last 72 hours. Urine analysis:    Component Value Date/Time   COLORURINE YELLOW 01/19/2021 0000   APPEARANCEUR CLEAR 01/19/2021 0000   LABSPEC 1.015 01/19/2021 0000   PHURINE 5.5 01/19/2021 0000   GLUCOSEU NEGATIVE 01/19/2021 0000   HGBUR NEGATIVE 01/19/2021 0000   BILIRUBINUR NEGATIVE 01/19/2021 0000   KETONESUR NEGATIVE 01/19/2021 0000   PROTEINUR NEGATIVE 01/19/2021 0000   NITRITE NEGATIVE 01/19/2021 0000   LEUKOCYTESUR NEGATIVE 01/19/2021 0000   Sepsis Labs: @LABRCNTIP (procalcitonin:4,lacticidven:4)  ) Recent Results (from the past 240 hour(s))  Resp Panel by RT-PCR (Flu A&B, Covid) Nasopharyngeal Swab     Status: None   Collection Time: 01/18/21 10:25 PM   Specimen: Nasopharyngeal Swab; Nasopharyngeal(NP) swabs in vial transport medium  Result Value Ref Range Status   SARS Coronavirus 2 by RT PCR NEGATIVE NEGATIVE Final    Comment: (NOTE) SARS-CoV-2 target nucleic acids are NOT DETECTED.  The SARS-CoV-2 RNA is generally detectable in upper respiratory specimens during the acute phase of infection.  The lowest concentration of SARS-CoV-2 viral copies this assay can detect is 138 copies/mL. A negative result does not preclude SARS-Cov-2 infection and should not be used as the sole basis for treatment or other patient management decisions. A negative result may occur with  improper specimen collection/handling, submission of specimen other than nasopharyngeal swab, presence of viral mutation(s) within the areas targeted by this assay, and inadequate number of viral copies(<138 copies/mL). A negative result must be combined with clinical observations, patient history, and epidemiological information. The expected result is Negative.  Fact Sheet for Patients:  03/20/21  Fact Sheet for Healthcare Providers:  BloggerCourse.com  This test is no t yet approved or cleared by the SeriousBroker.it FDA and  has been authorized for detection and/or diagnosis of SARS-CoV-2 by FDA under an Emergency Use Authorization (EUA). This EUA will remain  in effect (meaning this test can be used) for the duration of the COVID-19 declaration under Section 564(b)(1) of the Act, 21 U.S.C.section 360bbb-3(b)(1), unless the authorization is terminated  or revoked sooner.       Influenza A by PCR NEGATIVE NEGATIVE Final   Influenza B by PCR NEGATIVE NEGATIVE Final    Comment: (NOTE) The Xpert Xpress SARS-CoV-2/FLU/RSV plus assay is intended as an aid in the diagnosis of influenza from Nasopharyngeal swab specimens and should not be used as a sole basis for treatment. Nasal washings and aspirates are unacceptable for Xpert Xpress SARS-CoV-2/FLU/RSV testing.  Fact Sheet for Patients: [place]  Fact Sheet for Healthcare Providers: BloggerCourse.com  This test is not yet approved or cleared by the SeriousBroker.it FDA and has been authorized for detection and/or diagnosis of SARS-CoV-2 by FDA  under an Emergency Use Authorization (EUA). This EUA will remain in effect (meaning this test can be used) for the duration of the COVID-19 declaration under Section 564(b)(1) of the Act, 21 U.S.C. section 360bbb-3(b)(1), unless the authorization is terminated or revoked.  Performed at [facility], [address], [place], [place]   C Difficile Quick Screen w PCR reflex     Status: None   Collection Time: 01/19/21  6:13 AM   Specimen: STOOL  Result Value Ref Range Status   C Diff antigen NEGATIVE NEGATIVE Final   C Diff toxin NEGATIVE NEGATIVE Final   C Diff interpretation No C. difficile detected.  Final    Comment: Performed at [facility], [address], [place], [place] [address]      Studies: No results found.  Scheduled Meds:  aspirin EC  81 mg Oral BID   heparin  5,000 Units Subcutaneous Q8H   loperamide  4 mg Oral Q breakfast   montelukast  10 mg Oral QHS   PHENobarbital  129.6 mg Oral QHS   pramipexole  0.5 mg Oral QPM   rosuvastatin  20 mg Oral Daily   saccharomyces boulardii  250 mg Oral BID    Continuous Infusions:  thiamine injection 500 mg (01/25/21 0942)     LOS: 6 days     Myrtie Neither, MD [facility]  To reach me or the doctor on call, go to: [email or web address] Password [email or web address]  01/25/2021, 10:07 AM

## 2021-01-26 ENCOUNTER — Inpatient Hospital Stay (HOSPITAL_COMMUNITY): Payer: Medicare PPO

## 2021-01-26 DIAGNOSIS — N179 Acute kidney failure, unspecified: Secondary | ICD-10-CM | POA: Diagnosis not present

## 2021-01-26 DIAGNOSIS — R600 Localized edema: Secondary | ICD-10-CM | POA: Diagnosis not present

## 2021-01-26 DIAGNOSIS — I10 Essential (primary) hypertension: Secondary | ICD-10-CM | POA: Diagnosis not present

## 2021-01-26 LAB — CBC
HCT: 38 % (ref 36.0–46.0)
Hemoglobin: 13.2 g/dL (ref 12.0–15.0)
MCH: 32 pg (ref 26.0–34.0)
MCHC: 34.7 g/dL (ref 30.0–36.0)
MCV: 92.2 fL (ref 80.0–100.0)
Platelets: 326 10*3/uL (ref 150–400)
RBC: 4.12 MIL/uL (ref 3.87–5.11)
RDW: 13.2 % (ref 11.5–15.5)
WBC: 12.8 10*3/uL — ABNORMAL HIGH (ref 4.0–10.5)
nRBC: 0 % (ref 0.0–0.2)

## 2021-01-26 LAB — BASIC METABOLIC PANEL
Anion gap: 12 (ref 5–15)
BUN: 12 mg/dL (ref 8–23)
CO2: 24 mmol/L (ref 22–32)
Calcium: 9.8 mg/dL (ref 8.9–10.3)
Chloride: 101 mmol/L (ref 98–111)
Creatinine, Ser: 0.85 mg/dL (ref 0.44–1.00)
GFR, Estimated: >60 mL/min (ref >60)
Glucose, Bld: 121 mg/dL — ABNORMAL HIGH (ref 70–99)
Potassium: 3.1 mmol/L — ABNORMAL LOW (ref 3.5–5.1)
Sodium: 137 mmol/L (ref 135–145)

## 2021-01-26 MED ORDER — ONDANSETRON HCL 4 MG/2ML IJ SOLN
4.0000 mg | Freq: Four times a day (QID) | INTRAMUSCULAR | Status: DC | PRN
  Administered 2021-01-26: 4 mg via INTRAVENOUS
  Filled 2021-01-26: qty 2

## 2021-01-26 MED ORDER — THIAMINE HCL 100 MG PO TABS
100.0000 mg | ORAL_TABLET | Freq: Every day | ORAL | Status: DC
  Administered 2021-01-27 – 2021-02-01 (×6): 100 mg via ORAL
  Filled 2021-01-26 (×6): qty 1

## 2021-01-26 MED ORDER — LORAZEPAM 2 MG/ML IJ SOLN
0.2500 mg | Freq: Once | INTRAMUSCULAR | Status: AC
  Administered 2021-01-26: 0.25 mg via INTRAVENOUS
  Filled 2021-01-26: qty 1

## 2021-01-26 NOTE — Progress Notes (Addendum)
PROGRESS NOTE  SHON MANSOURI QZR:[id number] DOB: 11/19/1950 DOA: 01/18/2021 PCP: [name], MD  HPI/Recap of past 59 hours: 70 year old female with past medical history significant for chronic lower extremity lymphedema right worse than left, anemia, migraines, obstructive sleep apnea, restless leg syndrome, seizure disorder who was admitted for complaints of confusion and diarrhea.  Patient seen and examined at bedside.  She stated that she is doing much better her husband Annette Stable is at bedside.  She stated that her edema on the right side has gone down considerably.  But she is complaining that she is feeling bloated.  She had only 1 small bowel movement today but she is passing gas  January 26, 2021 Patient seen and examined at bedside her husband and son in the room. Complaining that she has had nausea and vomiting since yesterday with abdominal pain.  She was constipated yesterday and was given something for bowel movements that she is making a lot of watery stool now  Assessment/Plan: Principal Problem:   ARF (acute renal failure) (HCC) Active Problems:   Restless legs syndrome (RLS)   Seizure disorder (HCC)   Essential hypertension   Bilateral lower extremity edema   OSA on CPAP   Morbid obesity due to excess calories (HCC)  1.ileus: Abdominal pain nausea and vomiting I will obtain abdominal series stat Addendum Abdominal x-ray was done and is reviewed showing a lot of from gas: Diffusely air-filled and dilated small bowel loops She has been made n.p.o. Ordered NG tube Consulted surgery Ordered CT scan of the abdomen    2.  Seizure disorder Continue phenobarbital EEG showed marked diffuse slowing consistent with encephalopathy  3.  Morbid obesity Obstructive sleep apnea Continue CPAP  4.  Pressure injury January 19, 2021 anterior right deep tissue pressure injury Present on admission Continue wound care  5.  Chronic anemia H&H stable  6.  History of  rosacea on doxycycline But he was held due to diarrhea  7.  Diarrhea.   Unclear etiology C. difficile was negative She was given Imodium now she is complaining of constipation she was given Patient is now complaining of constipation.  We will stop Imodium and started her on MiraLAX  8.  Acute metabolic encephalopathy resolved Likely combination of polypharmacy as well as AKI with uremia Her gabapentin is on hold CTA of the head was negative EEG was negative showed diffuse slowing consistent with encephalopathy on her previous admission TSH within normal limit RPR negative  9.  Patient had constipation constipation yesterday.  Patient had diarrhea and was started on Imodium.  I started her her on MiraLAX and Senokot.  Yesterday ,stopped Imodium Patient made good bowel movement with MiraLAX complaining now of diarrhea.  And abdominal pain nausea and vomiting  Code Status: Full  Severity of Illness: The appropriate patient status for this patient is INPATIENT. Inpatient status is judged to be reasonable and necessary in order to provide the required intensity of service to ensure the patient's safety. The patient's presenting symptoms, physical exam findings, and initial radiographic and laboratory data in the context of their chronic comorbidities is felt to place them at high risk for further clinical deterioration. Furthermore, it is not anticipated that the patient will be medically stable for discharge from the hospital within 2 midnights of admission. The following factors support the patient status of inpatient. Altered mental status improving  * I certify that at the point of admission it is my clinical judgment that the patient will require  inpatient hospital care spanning beyond 2 midnights from the point of admission due to high intensity of service, high risk for further deterioration and high frequency of surveillance required.*   Family Communication: September 17 husband Bill  at bedside September 18 husband at bedside  Disposition Plan: To be determined Status is: Inpatient   Dispo: The patient is from: Home              Anticipated d/c is to: PT OT recommend CIR versus 24-hour supervision              Anticipated d/c date is: 1 to 2 days              Patient currently not medically stable for discharge  Consultants: Neurology  Procedures: EEG  Antimicrobials: None Was on doxycycline for rosacea but that has been held  DVT prophylaxis: Subacute heparin   Objective: Vitals:   01/24/21 2033 01/25/21 0516 01/25/21 0936 01/25/21 1700  BP: (!) 127/45 (!) 124/50 (!) 142/42 132/67  Pulse: 74 71 68 74  Resp: 16 18 16 18   Temp: 99.2 F (37.3 C) 98.2 F (36.8 C) 98.6 F (37 C) 98.4 F (36.9 C)  TempSrc: Oral Oral Oral Oral  SpO2: 97% 96% 98% 99%  Weight:  125.5 kg    Height:        Intake/Output Summary (Last 24 hours) at 01/26/2021 0858 Last data filed at 01/25/2021 2220 Gross per 24 hour  Intake 540 ml  Output 925 ml  Net -385 ml    Filed Weights   01/19/21 1742 01/20/21 2116 01/25/21 0516  Weight: 122.4 kg 122.4 kg 125.5 kg   Body mass index is 50.6 kg/m.  Exam:  General: 70 y.o. year-old female well developed well nourished in no acute distress.  Alert and oriented x3.  Morbidly obese no distress Cardiovascular: Regular rate and rhythm with no rubs or gallops.  No thyromegaly or JVD noted.   Respiratory: Clear to auscultation with no wheezes or rales. Good inspiratory effort. Abdomen: Firm vague tender mildly distended with increased bowel sounds  Musculoskeletal: No lower extremity edema. 2/4 pulses in all 4 extremities. Skin: Patient has ulcer Psychiatry: Mood is appropriate for condition and setting Neurology: Alert oriented x3 Extremity: Right lower extremity foot is deformity is noted and it is skin is quite thickened and puckered and shriveled    Data Reviewed: CBC: Recent Labs  Lab 01/20/21 0929 01/22/21 0330  01/23/21 0350  WBC 9.9 10.1 9.2  NEUTROABS  --  6.8 6.1  HGB 10.0* 10.6* 10.2*  HCT 31.0* 31.4* 30.5*  MCV 98.4 96.0 95.9  PLT 442* 370 305    Basic Metabolic Panel: Recent Labs  Lab 01/20/21 0929 01/22/21 0330 01/23/21 0350  NA 137 137 137  K 4.1 3.2* 4.0  CL 104 101 107  CO2 24 23 23   GLUCOSE 93 93 95  BUN 46* 16 9  CREATININE 1.06* 0.80 0.80  CALCIUM 9.9 9.4 9.0  MG 2.2  --   --     GFR: Estimated Creatinine Clearance: 84.1 mL/min (by C-G formula based on SCr of 0.8 mg/dL). Liver Function Tests: Recent Labs  Lab 01/22/21 0330 01/23/21 0350  AST 31 29  ALT 35 33  ALKPHOS 81 79  BILITOT 0.4 0.5  PROT 6.6 6.7  ALBUMIN 2.5* 2.6*    No results for input(s): LIPASE, AMYLASE in the last 168 hours. Recent Labs  Lab 01/22/21 0330  AMMONIA 27    Coagulation Profile:  No results for input(s): INR, PROTIME in the last 168 hours. Cardiac Enzymes: No results for input(s): CKTOTAL, CKMB, CKMBINDEX, TROPONINI in the last 168 hours. BNP (last 3 results) No results for input(s): PROBNP in the last 8760 hours. HbA1C: No results for input(s): HGBA1C in the last 72 hours. CBG: No results for input(s): GLUCAP in the last 168 hours. Lipid Profile: No results for input(s): CHOL, HDL, LDLCALC, TRIG, CHOLHDL, LDLDIRECT in the last 72 hours. Thyroid Function Tests: No results for input(s): TSH, T4TOTAL, FREET4, T3FREE, THYROIDAB in the last 72 hours. Anemia Panel: No results for input(s): VITAMINB12, FOLATE, FERRITIN, TIBC, IRON, RETICCTPCT in the last 72 hours. Urine analysis:    Component Value Date/Time   COLORURINE YELLOW 01/19/2021 0000   APPEARANCEUR CLEAR 01/19/2021 0000   LABSPEC 1.015 01/19/2021 0000   PHURINE 5.5 01/19/2021 0000   GLUCOSEU NEGATIVE 01/19/2021 0000   HGBUR NEGATIVE 01/19/2021 0000   BILIRUBINUR NEGATIVE 01/19/2021 0000   KETONESUR NEGATIVE 01/19/2021 0000   PROTEINUR NEGATIVE 01/19/2021 0000   NITRITE NEGATIVE 01/19/2021 0000    LEUKOCYTESUR NEGATIVE 01/19/2021 0000   Sepsis Labs: @LABRCNTIP (procalcitonin:4,lacticidven:4)  ) Recent Results (from the past 240 hour(s))  Resp Panel by RT-PCR (Flu A&B, Covid) Nasopharyngeal Swab     Status: None   Collection Time: 01/18/21 10:25 PM   Specimen: Nasopharyngeal Swab; Nasopharyngeal(NP) swabs in vial transport medium  Result Value Ref Range Status   SARS Coronavirus 2 by RT PCR NEGATIVE NEGATIVE Final    Comment: (NOTE) SARS-CoV-2 target nucleic acids are NOT DETECTED.  The SARS-CoV-2 RNA is generally detectable in upper respiratory specimens during the acute phase of infection. The lowest concentration of SARS-CoV-2 viral copies this assay can detect is 138 copies/mL. A negative result does not preclude SARS-Cov-2 infection and should not be used as the sole basis for treatment or other patient management decisions. A negative result may occur with  improper specimen collection/handling, submission of specimen other than nasopharyngeal swab, presence of viral mutation(s) within the areas targeted by this assay, and inadequate number of viral copies(<138 copies/mL). A negative result must be combined with clinical observations, patient history, and epidemiological information. The expected result is Negative.  Fact Sheet for Patients:  03/20/21  Fact Sheet for Healthcare Providers:  BloggerCourse.com  This test is no t yet approved or cleared by the [employer] FDA and  has been authorized for detection and/or diagnosis of SARS-CoV-2 by FDA under an Emergency Use Authorization (EUA). This EUA will remain  in effect (meaning this test can be used) for the duration of the COVID-19 declaration under Section 564(b)(1) of the Act, 21 U.S.C.section 360bbb-3(b)(1), unless the authorization is terminated  or revoked sooner.       Influenza A by PCR NEGATIVE NEGATIVE Final   Influenza B by PCR NEGATIVE  NEGATIVE Final    Comment: (NOTE) The Xpert Xpress SARS-CoV-2/FLU/RSV plus assay is intended as an aid in the diagnosis of influenza from Nasopharyngeal swab specimens and should not be used as a sole basis for treatment. Nasal washings and aspirates are unacceptable for Xpert Xpress SARS-CoV-2/FLU/RSV testing.  Fact Sheet for Patients: [place]  Fact Sheet for Healthcare Providers: BloggerCourse.com  This test is not yet approved or cleared by the [employer]ious[employer] FDA and has been authorized for detection and/or diagnosis of SARS-CoV-2 by FDA under an Emergency Use Authorization (EUA). This EUA will remain in effect (meaning this test can be used) for the duration of the COVID-19 declaration under Section 564(b)(1) of the  Act, 21 U.S.C. section 360bbb-3(b)(1), unless the authorization is terminated or revoked.  Performed at [facility], [address] [place], [place] [address]   C Difficile Quick Screen w PCR reflex     Status: None   Collection Time: 01/19/21  6:13 AM   Specimen: STOOL  Result Value Ref Range Status   C Diff antigen NEGATIVE NEGATIVE Final   C Diff toxin NEGATIVE NEGATIVE Final   C Diff interpretation No C. difficile detected.  Final    Comment: Performed at [facility], [address] [place], [place] [address]      Studies: No results found.  Scheduled Meds:  aspirin EC  81 mg Oral BID   heparin  5,000 Units Subcutaneous Q8H   loperamide  4 mg Oral Q breakfast   montelukast  10 mg Oral QHS   PHENobarbital  129.6 mg Oral QHS   polyethylene glycol  17 g Oral Daily   pramipexole  0.5 mg Oral QPM   rosuvastatin  20 mg Oral Daily   saccharomyces boulardii  250 mg Oral BID    Continuous Infusions:  thiamine injection Stopped (01/25/21 1100)     LOS: 7 days     Myrtie Neither, MD [facility]  To reach me or the doctor on call, go to: [email or web address] Password  [facility]  01/26/2021, 8:58 AM

## 2021-01-26 NOTE — Progress Notes (Signed)
Pt states having increased "sharp" lower abdominal pain radiating from LUQ to RUQ.  States feels nauseated and initially vomited small amt clear emesis.  After approximately 20 minutes, patient became more nauseated and vomited large amt dark green to dark brown emesis.  When abdomen was assessed, it was noted to be greatly distended with tympanic sounds on palpation.  MD was notified and ordered abdominal x-rays.
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
## 2021-01-26 NOTE — Progress Notes (Signed)
Discussed rational for placement of NG tube for gastric decompression to patient.  Both she and her husband appeared very receptive.  Pt appeared anxious re: the actual process, but was encouraged by support of staff.

## 2021-01-27 ENCOUNTER — Inpatient Hospital Stay (HOSPITAL_COMMUNITY): Payer: Medicare PPO

## 2021-01-27 DIAGNOSIS — Z4659 Encounter for fitting and adjustment of other gastrointestinal appliance and device: Secondary | ICD-10-CM | POA: Diagnosis not present

## 2021-01-27 DIAGNOSIS — N179 Acute kidney failure, unspecified: Secondary | ICD-10-CM | POA: Diagnosis not present

## 2021-01-27 LAB — COMPREHENSIVE METABOLIC PANEL
ALT: 22 U/L (ref 0–44)
AST: 21 U/L (ref 15–41)
Albumin: 2.6 g/dL — ABNORMAL LOW (ref 3.5–5.0)
Alkaline Phosphatase: 81 U/L (ref 38–126)
Anion gap: 10 (ref 5–15)
BUN: 14 mg/dL (ref 8–23)
CO2: 23 mmol/L (ref 22–32)
Calcium: 9.2 mg/dL (ref 8.9–10.3)
Chloride: 101 mmol/L (ref 98–111)
Creatinine, Ser: 0.89 mg/dL (ref 0.44–1.00)
GFR, Estimated: >60 mL/min (ref >60)
Glucose, Bld: 111 mg/dL — ABNORMAL HIGH (ref 70–99)
Potassium: 3.3 mmol/L — ABNORMAL LOW (ref 3.5–5.1)
Sodium: 134 mmol/L — ABNORMAL LOW (ref 135–145)
Total Bilirubin: 0.6 mg/dL (ref 0.3–1.2)
Total Protein: 7 g/dL (ref 6.5–8.1)

## 2021-01-27 LAB — CBC
HCT: 37.2 % (ref 36.0–46.0)
Hemoglobin: 12.4 g/dL (ref 12.0–15.0)
MCH: 31.1 pg (ref 26.0–34.0)
MCHC: 33.3 g/dL (ref 30.0–36.0)
MCV: 93.2 fL (ref 80.0–100.0)
Platelets: 299 10*3/uL (ref 150–400)
RBC: 3.99 MIL/uL (ref 3.87–5.11)
RDW: 13.3 % (ref 11.5–15.5)
WBC: 14.7 10*3/uL — ABNORMAL HIGH (ref 4.0–10.5)
nRBC: 0 % (ref 0.0–0.2)

## 2021-01-27 NOTE — Progress Notes (Signed)
RT set up patient's home CPAP and placed on bedside table. Patient placed herself on CPAP at this time. RT will monitor as needed.

## 2021-01-27 NOTE — Progress Notes (Signed)
[facility] Progress Note     Subjective: CC-  CT scan completed this morning consistent with adynamic ileus. NG tube was placed and patient accidentally pulled it out. Since then she had a large BM and is passing some flatus. States that she is still somewhat bloated but it is improved from yesterday and she denies any n/v.  Objective: Vital signs in last 24 hours: Temp:  [98 F (36.7 C)-98.8 F (37.1 C)] 98.8 F (37.1 C) (09/19 0951) Pulse Rate:  [93-103] 94 (09/19 0951) Resp:  [16-18] 18 (09/19 0951) BP: (126-144)/(56-69) 126/56 (09/19 0951) SpO2:  [92 %-100 %] 92 % (09/19 0951) Last BM Date: 01/26/21  Intake/Output from previous day: 09/18 0701 - 09/19 0700 In: 360 [P.O.:360] Out: 1000 [Urine:800; Emesis/NG output:200] Intake/Output this shift: No intake/output data recorded.  PE: Gen:  Alert, NAD, pleasant Pulm:  rate and effort normal Abd: distended but soft, mild right sided TTP without rebound or guarding  Lab Results:  Recent Labs    01/26/21 1818 01/27/21 0834  WBC 12.8* 14.7*  HGB 13.2 12.4  HCT 38.0 37.2  PLT 326 299   BMET Recent Labs    01/26/21 1818 01/27/21 0834  NA 137 134*  K 3.1* 3.3*  CL 101 101  CO2 24 23  GLUCOSE 121* 111*  BUN 12 14  CREATININE 0.85 0.89  CALCIUM 9.8 9.2   PT/INR No results for input(s): LABPROT, INR in the last 72 hours. CMP     Component Value Date/Time   NA 134 (L) 01/27/2021 0834   NA 136 01/04/2019 0924   K 3.3 (L) 01/27/2021 0834   CL 101 01/27/2021 0834   CO2 23 01/27/2021 0834   GLUCOSE 111 (H) 01/27/2021 0834   BUN 14 01/27/2021 0834   BUN 36 (H) 01/04/2019 0924   CREATININE 0.89 01/27/2021 0834   CREATININE 0.75 01/01/2016 1404   CALCIUM 9.2 01/27/2021 0834   PROT 7.0 01/27/2021 0834   PROT 7.5 01/04/2019 0924   ALBUMIN 2.6 (L) 01/27/2021 0834   ALBUMIN 4.2 01/04/2019 0924   AST 21 01/27/2021 0834   ALT 22 01/27/2021 0834   ALKPHOS 81 01/27/2021 0834   BILITOT 0.6 01/27/2021  0834   BILITOT 0.3 01/04/2019 0924   GFRNONAA >60 01/27/2021 0834   GFRAA 73 01/04/2019 0924   Lipase     Component Value Date/Time   LIPASE <10 (L) 01/08/2021 2148       Studies/Results: CT ABDOMEN PELVIS WO CONTRAST  Result Date: 01/27/2021 CLINICAL DATA:  Abdominal pain, acute, nonlocalized EXAM: CT ABDOMEN AND PELVIS WITHOUT CONTRAST TECHNIQUE: Multidetector CT imaging of the abdomen and pelvis was performed following the standard protocol without IV contrast. COMPARISON:  None. FINDINGS: Lower chest: The visualized lung bases are clear bilaterally. The visualized heart and pericardium are unremarkable. Hepatobiliary: The visualized liver is unremarkable. Gallbladder unremarkable. No intra or extrahepatic biliary ductal dilation. Pancreas: Unremarkable Spleen: Unremarkable Adrenals/Urinary Tract: No adrenal hemorrhage or renal injury identified. Bladder is unremarkable. Stomach/Bowel: There are several prominent gas and fluid-filled loops of large and small bowel without a focal point of transition identified most in keeping with an adynamic ileus. Nasogastric tube tip is seen within the distal body of the stomach. No superimposed evidence of focal inflammation. Trace free intraperitoneal fluid. No free intraperitoneal gas. The appendix is unremarkable. Vascular/Lymphatic: Mild aortoiliac atherosclerotic calcification. No aortic aneurysm. No pathologic adenopathy within the abdomen and pelvis. Reproductive: Status post hysterectomy. No adnexal masses. Other: No abdominal wall hernia.  Rectum unremarkable. Musculoskeletal: Advanced degenerative changes are noted within the right hip. Moderate degenerative changes noted within the left hip. Mild degenerative changes noted within the lumbar spine. No acute bone abnormality. IMPRESSION: Findings most in keeping with an adynamic ileus. Nasogastric tube tip within the distal body of the stomach. Mild ascites, nonspecific. Severe asymmetric right hip  degenerative arthritis. Aortic Atherosclerosis (ICD10-I70.0). Electronically Signed   By: Helyn Numbers M.D.   On: 01/27/2021 02:31   DG Abd 2 Views  Result Date: 01/26/2021 CLINICAL DATA:  Generalized abdominal pain and distension. EXAM: ABDOMEN - 2 VIEW COMPARISON:  None. FINDINGS: Small bowel loops are diffusely air-filled and dilated. There is also gaseous distention of the colon. Some colonic loops appear dilated as well. No suspicious calcifications are seen. There is some reticular opacities in lung bases. IMPRESSION: 1. Diffusely dilated small and large bowel. Findings are concerning for severe ileus. A bowel obstruction cannot be excluded. Electronically Signed   By: Darliss Cheney M.D.   On: 01/26/2021 15:48   DG Abd Portable 1V  Result Date: 01/26/2021 CLINICAL DATA:  Nasogastric tube placement EXAM: PORTABLE ABDOMEN - 1 VIEW COMPARISON:  None. FINDINGS: Esophageal catheter tip and side port are in the distal stomach. Incompletely visualized dilated loops of bowel in the upper abdomen. IMPRESSION: Esophageal catheter tip and side port in the distal stomach. Electronically Signed   By: Deatra Robinson M.D.   On: 01/26/2021 20:34    Anti-infectives: Anti-infectives (From admission, onward)    Start     Dose/Rate Route Frequency Ordered Stop   01/19/21 1000  doxycycline (VIBRAMYCIN) 50 MG capsule 50 mg  Status:  Discontinued        50 mg Oral Daily 01/19/21 0256 01/20/21 1310        Assessment/Plan Adynamic ileus - Patient pulled out NG tube, but clinically she is somewhat improved with less abdominal distension, she had a large BM and is passing some flatus. Trial clear liquids. Monitor electrolytes and keep K>4 and Mag>2. Mobilize at able. We will follow.  ID - none currently FEN - CLD VTE - sq heparin Foley - none  Morbid obesity Chronic bilateral lower extremity lymphedema Chronic back and lower extremity pain Seizures OSA noncompliant with CPAP HTN SAH s/p craniotomy  and aneurysm clipping in 1986   LOS: 8 days    Franne Forts, [facility] 01/27/2021, 10:09 AM Please see Amion for pager number during day hours 7:00am-4:30pm

## 2021-01-27 NOTE — Progress Notes (Signed)
Inpatient Rehab Admissions Coordinator:   Pt. With possible bowel obstruction vs. Ileus. Not medically ready for CIR at this time. I will follow for potential admit pending insurance auth and bed availability.  Megan Salon, MS, CCC-SLP Rehab Admissions Coordinator  [phone] (celll) [phone] (office)

## 2021-01-27 NOTE — Progress Notes (Signed)
PROGRESS NOTE  BETTE BRIENZA FTD:[id number] DOB: Dec 12, 1950 DOA: 01/18/2021 PCP: [name], MD  HPI/Recap of past 63 hours: 70 year old female with past medical history significant for chronic lower extremity lymphedema right worse than left, anemia, migraines, obstructive sleep apnea, restless leg syndrome, seizure disorder , was recently discharged from [facility] to rehab after treatment for sepsis and right lower extremity cellulitis, 4 days after discharge labs in rehab noted worsening creatinine up to 3 and elevated potassium, subsequently sent back to the emergency room , history of ongoing diarrhea, denied any abdominal pain nausea or vomiting, also had some confusion on admission. -After admission she was hydrated, diuretics and ARB were held, gabapentin was held as well, mental status and kidney function improved, subsequently had recurrence of chronic diarrhea, infectious work-up was negative, she was given a dose of Imodium. -This was followed by constipation, nausea vomiting and abdominal distention, developed ileus, requiring NG tube placement -Dr. Barrie Folk consulted general surgery yesterday  Assessment/Plan:  Ileus -CT abdomen pelvis noted dilated loops of small and large intestine,, NG tube was placed to intermittent suction -Patient pulled out NG tube this morning, she also had a bowel movement overnight, abdomen is mildly distended without any vomiting today -Will try clears, out of bed to chair, increase activity  -CCS following  -Replace potassium IV  AKI/hyperkalemia -Resolved, treated with hydration, ARB and diuretics were held  Metabolic encephalopathy -Secondary to AKI, decreased gabapentin clearance, improved -hold off on Gabapentin for now  History of seizure disorder -Continue phenobarbital  Obesity/OSA -Continue CPAP nightly  Pressure injury  -anterior right deep tissue pressure injury, poa -continue wound care  Chronic diarrhea -Infectious work-up  negative, will need gastroenterology follow-up -Now with ileus as above  Chronic lower extremity lymphedema Chronic dermatitis  DVT prophylaxis: Heparin subcu  Code Status: Full Family Communication: Discussed with patient in detail, no family at bedside  Disposition Plan: To be determined Status is: Inpatient   Dispo: The patient is from: Home              Anticipated d/c is to: PT OT recommend CIR versus 24-hour supervision              Anticipated d/c date is: Likely 48 hours              Patient currently not medically stable for discharge  Consultants: Neurology   Procedures: EEG  Antimicrobials: None Was on doxycycline for rosacea but that has been held   Objective: Vitals:   01/26/21 1659 01/26/21 2113 01/27/21 0515 01/27/21 0951  BP: (!) 144/58 (!) 142/69 129/66 (!) 126/56  Pulse: 94 93 (!) 103 94  Resp: 18 18 16 18   Temp: 98 F (36.7 C) 98.6 F (37 C) 98.4 F (36.9 C) 98.8 F (37.1 C)  TempSrc: Oral Oral  Oral  SpO2: 100% 93% 92% 92%  Weight:      Height:        Intake/Output Summary (Last 24 hours) at 01/27/2021 1031 Last data filed at 01/27/2021 0515 Gross per 24 hour  Intake 240 ml  Output 350 ml  Net -110 ml   Filed Weights   01/19/21 1742 01/20/21 2116 01/25/21 0516  Weight: 122.4 kg 122.4 kg 125.5 kg   Body mass index is 50.6 kg/m.  Exam:  Gen: Obese chronically ill female, appears older than stated age, AAO x3, no distress HEENT: No JVD CVS: S1-S2, regular rhythm Lungs: Decreased breath sounds the bases otherwise clear Abdomen: Soft, obese, mildly distended,  nontender, bowel sounds present Extremities: Dry, thickened scaly puckered skin worse on the right    Data Reviewed: CBC: Recent Labs  Lab 01/22/21 0330 01/23/21 0350 01/26/21 1818 01/27/21 0834  WBC 10.1 9.2 12.8* 14.7*  NEUTROABS 6.8 6.1  --   --   HGB 10.6* 10.2* 13.2 12.4  HCT 31.4* 30.5* 38.0 37.2  MCV 96.0 95.9 92.2 93.2  PLT 370 305 326 299   Basic  Metabolic Panel: Recent Labs  Lab 01/22/21 0330 01/23/21 0350 01/26/21 1818 01/27/21 0834  NA 137 137 137 134*  K 3.2* 4.0 3.1* 3.3*  CL 101 107 101 101  CO2 23 23 24 23   GLUCOSE 93 95 121* 111*  BUN 16 9 12 14   CREATININE 0.80 0.80 0.85 0.89  CALCIUM 9.4 9.0 9.8 9.2   GFR: Estimated Creatinine Clearance: 75.6 mL/min (by C-G formula based on SCr of 0.89 mg/dL). Liver Function Tests: Recent Labs  Lab 01/22/21 0330 01/23/21 0350 01/27/21 0834  AST 31 29 21   ALT 35 33 22  ALKPHOS 81 79 81  BILITOT 0.4 0.5 0.6  PROT 6.6 6.7 7.0  ALBUMIN 2.5* 2.6* 2.6*   No results for input(s): LIPASE, AMYLASE in the last 168 hours. Recent Labs  Lab 01/22/21 0330  AMMONIA 27   Coagulation Profile: No results for input(s): INR, PROTIME in the last 168 hours. Cardiac Enzymes: No results for input(s): CKTOTAL, CKMB, CKMBINDEX, TROPONINI in the last 168 hours. BNP (last 3 results) No results for input(s): PROBNP in the last 8760 hours. HbA1C: No results for input(s): HGBA1C in the last 72 hours. CBG: No results for input(s): GLUCAP in the last 168 hours. Lipid Profile: No results for input(s): CHOL, HDL, LDLCALC, TRIG, CHOLHDL, LDLDIRECT in the last 72 hours. Thyroid Function Tests: No results for input(s): TSH, T4TOTAL, FREET4, T3FREE, THYROIDAB in the last 72 hours. Anemia Panel: No results for input(s): VITAMINB12, FOLATE, FERRITIN, TIBC, IRON, RETICCTPCT in the last 72 hours. Urine analysis:    Component Value Date/Time   COLORURINE YELLOW 01/19/2021 0000   APPEARANCEUR CLEAR 01/19/2021 0000   LABSPEC 1.015 01/19/2021 0000   PHURINE 5.5 01/19/2021 0000   GLUCOSEU NEGATIVE 01/19/2021 0000   HGBUR NEGATIVE 01/19/2021 0000   BILIRUBINUR NEGATIVE 01/19/2021 0000   KETONESUR NEGATIVE 01/19/2021 0000   PROTEINUR NEGATIVE 01/19/2021 0000   NITRITE NEGATIVE 01/19/2021 0000   LEUKOCYTESUR NEGATIVE 01/19/2021 0000   Sepsis  Labs: @LABRCNTIP (procalcitonin:4,lacticidven:4)  ) Recent Results (from the past 240 hour(s))  Resp Panel by RT-PCR (Flu A&B, Covid) Nasopharyngeal Swab     Status: None   Collection Time: 01/18/21 10:25 PM   Specimen: Nasopharyngeal Swab; Nasopharyngeal(NP) swabs in vial transport medium  Result Value Ref Range Status   SARS Coronavirus 2 by RT PCR NEGATIVE NEGATIVE Final    Comment: (NOTE) SARS-CoV-2 target nucleic acids are NOT DETECTED.  The SARS-CoV-2 RNA is generally detectable in upper respiratory specimens during the acute phase of infection. The lowest concentration of SARS-CoV-2 viral copies this assay can detect is 138 copies/mL. A negative result does not preclude SARS-Cov-2 infection and should not be used as the sole basis for treatment or other patient management decisions. A negative result may occur with  improper specimen collection/handling, submission of specimen other than nasopharyngeal swab, presence of viral mutation(s) within the areas targeted by this assay, and inadequate number of viral copies(<138 copies/mL). A negative result must be combined with clinical observations, patient history, and epidemiological information. The expected result is Negative.  Fact  Sheet for Patients:  BloggerCourse.com  Fact Sheet for Healthcare Providers:  SeriousBroker.it  This test is no t yet approved or cleared by the [place] FDA and  has been authorized for detection and/or diagnosis of SARS-CoV-2 by FDA under an Emergency Use Authorization (EUA). This EUA will remain  in effect (meaning this test can be used) for the duration of the COVID-19 declaration under Section 564(b)(1) of the Act, 21 U.S.C.section 360bbb-3(b)(1), unless the authorization is terminated  or revoked sooner.       Influenza A by PCR NEGATIVE NEGATIVE Final   Influenza B by PCR NEGATIVE NEGATIVE Final    Comment: (NOTE) The Xpert  Xpress SARS-CoV-2/FLU/RSV plus assay is intended as an aid in the diagnosis of influenza from Nasopharyngeal swab specimens and should not be used as a sole basis for treatment. Nasal washings and aspirates are unacceptable for Xpert Xpress SARS-CoV-2/FLU/RSV testing.  Fact Sheet for Patients: BloggerCourse.com  Fact Sheet for Healthcare Providers: SeriousBroker.it  This test is not yet approved or cleared by the [place] FDA and has been authorized for detection and/or diagnosis of SARS-CoV-2 by FDA under an Emergency Use Authorization (EUA). This EUA will remain in effect (meaning this test can be used) for the duration of the COVID-19 declaration under Section 564(b)(1) of the Act, 21 U.S.C. section 360bbb-3(b)(1), unless the authorization is terminated or revoked.  Performed at [facility], [address], [place], [place] [address]   C Difficile Quick Screen w PCR reflex     Status: None   Collection Time: 01/19/21  6:13 AM   Specimen: STOOL  Result Value Ref Range Status   C Diff antigen NEGATIVE NEGATIVE Final   C Diff toxin NEGATIVE NEGATIVE Final   C Diff interpretation No C. difficile detected.  Final    Comment: Performed at [facility], [address] [place], [place] [address]      Studies: CT ABDOMEN PELVIS WO CONTRAST  Result Date: 01/27/2021 CLINICAL DATA:  Abdominal pain, acute, nonlocalized EXAM: CT ABDOMEN AND PELVIS WITHOUT CONTRAST TECHNIQUE: Multidetector CT imaging of the abdomen and pelvis was performed following the standard protocol without IV contrast. COMPARISON:  None. FINDINGS: Lower chest: The visualized lung bases are clear bilaterally. The visualized heart and pericardium are unremarkable. Hepatobiliary: The visualized liver is unremarkable. Gallbladder unremarkable. No intra or extrahepatic biliary ductal dilation. Pancreas: Unremarkable Spleen: Unremarkable Adrenals/Urinary  Tract: No adrenal hemorrhage or renal injury identified. Bladder is unremarkable. Stomach/Bowel: There are several prominent gas and fluid-filled loops of large and small bowel without a focal point of transition identified most in keeping with an adynamic ileus. Nasogastric tube tip is seen within the distal body of the stomach. No superimposed evidence of focal inflammation. Trace free intraperitoneal fluid. No free intraperitoneal gas. The appendix is unremarkable. Vascular/Lymphatic: Mild aortoiliac atherosclerotic calcification. No aortic aneurysm. No pathologic adenopathy within the abdomen and pelvis. Reproductive: Status post hysterectomy. No adnexal masses. Other: No abdominal wall hernia.  Rectum unremarkable. Musculoskeletal: Advanced degenerative changes are noted within the right hip. Moderate degenerative changes noted within the left hip. Mild degenerative changes noted within the lumbar spine. No acute bone abnormality. IMPRESSION: Findings most in keeping with an adynamic ileus. Nasogastric tube tip within the distal body of the stomach. Mild ascites, nonspecific. Severe asymmetric right hip degenerative arthritis. Aortic Atherosclerosis (ICD10-I70.0). Electronically Signed   By: Helyn Numbers M.D.   On: 01/27/2021 02:31   DG Abd 2 Views  Result Date: 01/26/2021  CLINICAL DATA:  Generalized abdominal pain and distension. EXAM: ABDOMEN - 2 VIEW COMPARISON:  None. FINDINGS: Small bowel loops are diffusely air-filled and dilated. There is also gaseous distention of the colon. Some colonic loops appear dilated as well. No suspicious calcifications are seen. There is some reticular opacities in lung bases. IMPRESSION: 1. Diffusely dilated small and large bowel. Findings are concerning for severe ileus. A bowel obstruction cannot be excluded. Electronically Signed   By: Darliss Cheney M.D.   On: 01/26/2021 15:48   DG Abd Portable 1V  Result Date: 01/26/2021 CLINICAL DATA:  Nasogastric tube  placement EXAM: PORTABLE ABDOMEN - 1 VIEW COMPARISON:  None. FINDINGS: Esophageal catheter tip and side port are in the distal stomach. Incompletely visualized dilated loops of bowel in the upper abdomen. IMPRESSION: Esophageal catheter tip and side port in the distal stomach. Electronically Signed   By: Deatra Robinson M.D.   On: 01/26/2021 20:34    Scheduled Meds:  aspirin EC  81 mg Oral BID   heparin  5,000 Units Subcutaneous Q8H   montelukast  10 mg Oral QHS   PHENobarbital  129.6 mg Oral QHS   pramipexole  0.5 mg Oral QPM   rosuvastatin  20 mg Oral Daily   saccharomyces boulardii  250 mg Oral BID   thiamine  100 mg Oral Daily    Continuous Infusions:     LOS: 8 days     Zannie Cove, MD [facility]   01/27/2021, 10:31 AM

## 2021-01-27 NOTE — Progress Notes (Signed)
PT Cancellation Note  Patient Details Name: ROME SCHLAUCH MRN: [id number] DOB: 01-31-1951   Cancelled Treatment:    Reason Eval/Treat Not Completed: Other (comment) Discussed with RN/NT; pt with persistent diarrhea with movement. Will follow up.  Lillia Pauls, PT, DPT Acute Rehabilitation Services Pager [phone] Office [phone]    Norval Morton 01/27/2021, 3:06 PM

## 2021-01-28 DIAGNOSIS — Z4659 Encounter for fitting and adjustment of other gastrointestinal appliance and device: Secondary | ICD-10-CM | POA: Diagnosis not present

## 2021-01-28 DIAGNOSIS — N179 Acute kidney failure, unspecified: Secondary | ICD-10-CM | POA: Diagnosis not present

## 2021-01-28 LAB — CBC
HCT: 33.8 % — ABNORMAL LOW (ref 36.0–46.0)
Hemoglobin: 11.5 g/dL — ABNORMAL LOW (ref 12.0–15.0)
MCH: 31.4 pg (ref 26.0–34.0)
MCHC: 34 g/dL (ref 30.0–36.0)
MCV: 92.3 fL (ref 80.0–100.0)
Platelets: 270 10*3/uL (ref 150–400)
RBC: 3.66 MIL/uL — ABNORMAL LOW (ref 3.87–5.11)
RDW: 13.3 % (ref 11.5–15.5)
WBC: 11.2 10*3/uL — ABNORMAL HIGH (ref 4.0–10.5)
nRBC: 0 % (ref 0.0–0.2)

## 2021-01-28 LAB — BASIC METABOLIC PANEL
Anion gap: 14 (ref 5–15)
BUN: 16 mg/dL (ref 8–23)
CO2: 23 mmol/L (ref 22–32)
Calcium: 9.3 mg/dL (ref 8.9–10.3)
Chloride: 96 mmol/L — ABNORMAL LOW (ref 98–111)
Creatinine, Ser: 0.76 mg/dL (ref 0.44–1.00)
GFR, Estimated: >60 mL/min (ref >60)
Glucose, Bld: 103 mg/dL — ABNORMAL HIGH (ref 70–99)
Potassium: 2.7 mmol/L — CL (ref 3.5–5.1)
Sodium: 133 mmol/L — ABNORMAL LOW (ref 135–145)

## 2021-01-28 MED ORDER — BOOST / RESOURCE BREEZE PO LIQD CUSTOM
1.0000 | Freq: Three times a day (TID) | ORAL | Status: DC
  Administered 2021-01-28 – 2021-02-01 (×13): 1 via ORAL

## 2021-01-28 MED ORDER — POTASSIUM CHLORIDE 10 MEQ/100ML IV SOLN: 10.0000 meq | INTRAVENOUS | Status: DC

## 2021-01-28 MED ORDER — POTASSIUM CHLORIDE 10 MEQ/100ML IV SOLN
10.0000 meq | INTRAVENOUS | Status: DC
  Administered 2021-01-28 (×2): 10 meq via INTRAVENOUS
  Filled 2021-01-28 (×3): qty 100

## 2021-01-28 MED ORDER — POTASSIUM CHLORIDE 20 MEQ PO PACK
40.0000 meq | PACK | Freq: Two times a day (BID) | ORAL | Status: DC
  Administered 2021-01-28 (×2): 40 meq via ORAL
  Filled 2021-01-28 (×2): qty 2

## 2021-01-28 MED ORDER — POTASSIUM CHLORIDE 10 MEQ/100ML IV SOLN
10.0000 meq | INTRAVENOUS | Status: DC
  Filled 2021-01-28 (×2): qty 100

## 2021-01-28 MED ORDER — POTASSIUM CHLORIDE 10 MEQ/100ML IV SOLN
10.0000 meq | INTRAVENOUS | Status: AC
  Administered 2021-01-28 (×3): 10 meq via INTRAVENOUS

## 2021-01-28 NOTE — Plan of Care (Signed)
  Problem: Elimination: Goal: Will not experience complications related to bowel motility Outcome: Progressing   

## 2021-01-28 NOTE — Progress Notes (Signed)
Patient's K+ level=2.7 Kakrakandy,MD notified via secure chat. Order received.

## 2021-01-28 NOTE — Plan of Care (Signed)
  Problem: Elimination: Goal: Will not experience complications related to bowel motility Outcome: Progressing   Problem: Elimination: Goal: Will not experience complications related to urinary retention Outcome: Progressing   Problem: Activity: Goal: Risk for activity intolerance will decrease Outcome: Not Progressing   Problem: Nutrition: Goal: Adequate nutrition will be maintained Outcome: Not Progressing

## 2021-01-28 NOTE — Progress Notes (Signed)
Inpatient Rehab Admissions Coordinator:   Pt. With continued ileus, not ready for CIR at this time. If resolved tomorrow, will open a case with insurance for auth.   Megan Salon, MS, CCC-SLP Rehab Admissions Coordinator  [phone] (celll) [phone] (office)

## 2021-01-28 NOTE — Progress Notes (Signed)
PROGRESS NOTE  [name] PJA:[id number] DOB: 20-Jan-1951 DOA: 01/18/2021 PCP: [name], MD  HPI/Recap of past 49 hours: 70 year old female with past medical history significant for chronic lower extremity lymphedema right worse than left, anemia, migraines, obstructive sleep apnea, restless leg syndrome, seizure disorder , was recently discharged from [facility] to rehab after treatment for sepsis and right lower extremity cellulitis, 4 days after discharge labs in rehab noted worsening creatinine up to 3 and elevated potassium, subsequently sent back to the emergency room , history of ongoing diarrhea, denied any abdominal pain nausea or vomiting, also had some confusion on admission. -After admission she was hydrated, diuretics and ARB were held, gabapentin was held as well, mental status and kidney function improved, subsequently had recurrence of chronic diarrhea, infectious work-up was negative, she was given a dose of Imodium. -This was followed by constipation, nausea vomiting and abdominal distention, developed ileus, requiring NG tube placement -Dr. Barrie Folk consulted general surgery 9/18  Assessment/Plan:  Ileus -CT abdomen pelvis noted dilated loops of small and large intestine,, NG tube was placed to intermittent suction -Patient pulled out NG tube yesterday, now having BMs, abdomen less distended, started clears yesterday  -Will advance to full liquids, out of bed to chair, increase activity as tolerated  -Replace potassium  -PT following  AKI/hyperkalemia on admission -Resolved, treated with hydration, ARB and diuretics were held  Severe hypokalemia -Due to GI losses, multiple BMs overnight with resolving ileus -Replace, labs in a.m.  Metabolic encephalopathy -Secondary to AKI, decreased gabapentin clearance, improved -hold off on Gabapentin for now  History of seizure disorder -Continue phenobarbital  Obesity/OSA -Continue CPAP nightly  Pressure injury  -anterior  right deep tissue pressure injury, poa -continue wound care  Chronic diarrhea -Infectious work-up negative, will need gastroenterology follow-up -Now with ileus as above  Chronic lower extremity lymphedema Chronic dermatitis  DVT prophylaxis: Heparin subcu  Code Status: Full Family Communication: Discussed with patient in detail, no family at bedside  Disposition Plan: Possibly CIR Status is: Inpatient   Dispo: The patient is from: Home              Anticipated d/c is to: PT OT recommend CIR versus 24-hour supervision              Anticipated d/c date is: 1 to 2 days              Patient currently not medically stable for discharge  Consultants: Neurology General surgery  Procedures: EEG  Antimicrobials: None Was on doxycycline for rosacea but that has been held   Objective: Vitals:   01/27/21 1700 01/27/21 2027 01/27/21 2101 01/28/21 0508  BP: 138/60 (!) 111/59  106/72  Pulse: 90 83 80 84  Resp: 18 18 16 18   Temp: 98 F (36.7 C) 98.4 F (36.9 C)  98.2 F (36.8 C)  TempSrc: Oral Oral  Oral  SpO2: 94% 91% 94% 91%  Weight:  123 kg    Height:        Intake/Output Summary (Last 24 hours) at 01/28/2021 1200 Last data filed at 01/28/2021 0200 Gross per 24 hour  Intake 1000 ml  Output 0 ml  Net 1000 ml   Filed Weights   01/20/21 2116 01/25/21 0516 01/27/21 2027  Weight: 122.4 kg 125.5 kg 123 kg   Body mass index is 49.6 kg/m.  Exam:  Gen: Obese chronically ill female, appears older than stated age, AAOx 2, no distress HEENT: Neck obese unable to assess JVD CVS:  S1-S2, regular rate rhythm Lungs: Decreased breath sounds the bases Abdomen: Soft, obese, mildly distended, nontender, bowel sounds present  Extremities: Dry, thickened scaly puckered skin worse on the right    Data Reviewed: CBC: Recent Labs  Lab 01/22/21 0330 01/23/21 0350 01/26/21 1818 01/27/21 0834 01/28/21 0516  WBC 10.1 9.2 12.8* 14.7* 11.2*  NEUTROABS 6.8 6.1  --   --   --    HGB 10.6* 10.2* 13.2 12.4 11.5*  HCT 31.4* 30.5* 38.0 37.2 33.8*  MCV 96.0 95.9 92.2 93.2 92.3  PLT 370 305 326 299 270   Basic Metabolic Panel: Recent Labs  Lab 01/22/21 0330 01/23/21 0350 01/26/21 1818 01/27/21 0834 01/28/21 0516  NA 137 137 137 134* 133*  K 3.2* 4.0 3.1* 3.3* 2.7*  CL 101 107 101 101 96*  CO2 23 23 24 23 23   GLUCOSE 93 95 121* 111* 103*  BUN 16 9 12 14 16   CREATININE 0.80 0.80 0.85 0.89 0.76  CALCIUM 9.4 9.0 9.8 9.2 9.3   GFR: Estimated Creatinine Clearance: 83.1 mL/min (by C-G formula based on SCr of 0.76 mg/dL). Liver Function Tests: Recent Labs  Lab 01/22/21 0330 01/23/21 0350 01/27/21 0834  AST 31 29 21   ALT 35 33 22  ALKPHOS 81 79 81  BILITOT 0.4 0.5 0.6  PROT 6.6 6.7 7.0  ALBUMIN 2.5* 2.6* 2.6*   No results for input(s): LIPASE, AMYLASE in the last 168 hours. Recent Labs  Lab 01/22/21 0330  AMMONIA 27   Coagulation Profile: No results for input(s): INR, PROTIME in the last 168 hours. Cardiac Enzymes: No results for input(s): CKTOTAL, CKMB, CKMBINDEX, TROPONINI in the last 168 hours. BNP (last 3 results) No results for input(s): PROBNP in the last 8760 hours. HbA1C: No results for input(s): HGBA1C in the last 72 hours. CBG: No results for input(s): GLUCAP in the last 168 hours. Lipid Profile: No results for input(s): CHOL, HDL, LDLCALC, TRIG, CHOLHDL, LDLDIRECT in the last 72 hours. Thyroid Function Tests: No results for input(s): TSH, T4TOTAL, FREET4, T3FREE, THYROIDAB in the last 72 hours. Anemia Panel: No results for input(s): VITAMINB12, FOLATE, FERRITIN, TIBC, IRON, RETICCTPCT in the last 72 hours. Urine analysis:    Component Value Date/Time   COLORURINE YELLOW 01/19/2021 0000   APPEARANCEUR CLEAR 01/19/2021 0000   LABSPEC 1.015 01/19/2021 0000   PHURINE 5.5 01/19/2021 0000   GLUCOSEU NEGATIVE 01/19/2021 0000   HGBUR NEGATIVE 01/19/2021 0000   BILIRUBINUR NEGATIVE 01/19/2021 0000   KETONESUR NEGATIVE 01/19/2021 0000    PROTEINUR NEGATIVE 01/19/2021 0000   NITRITE NEGATIVE 01/19/2021 0000   LEUKOCYTESUR NEGATIVE 01/19/2021 0000   Sepsis Labs: @LABRCNTIP (procalcitonin:4,lacticidven:4)  ) Recent Results (from the past 240 hour(s))  Resp Panel by RT-PCR (Flu A&B, Covid) Nasopharyngeal Swab     Status: None   Collection Time: 01/18/21 10:25 PM   Specimen: Nasopharyngeal Swab; Nasopharyngeal(NP) swabs in vial transport medium  Result Value Ref Range Status   SARS Coronavirus 2 by RT PCR NEGATIVE NEGATIVE Final    Comment: (NOTE) SARS-CoV-2 target nucleic acids are NOT DETECTED.  The SARS-CoV-2 RNA is generally detectable in upper respiratory specimens during the acute phase of infection. The lowest concentration of SARS-CoV-2 viral copies this assay can detect is 138 copies/mL. A negative result does not preclude SARS-Cov-2 infection and should not be used as the sole basis for treatment or other patient management decisions. A negative result may occur with  improper specimen collection/handling, submission of specimen other than nasopharyngeal swab, presence of viral  mutation(s) within the areas targeted by this assay, and inadequate number of viral copies(<138 copies/mL). A negative result must be combined with clinical observations, patient history, and epidemiological information. The expected result is Negative.  Fact Sheet for Patients:  BloggerCourse.com  Fact Sheet for Healthcare Providers:  SeriousBroker.it  This test is no t yet approved or cleared by the [place] FDA and  has been authorized for detection and/or diagnosis of SARS-CoV-2 by FDA under an Emergency Use Authorization (EUA). This EUA will remain  in effect (meaning this test can be used) for the duration of the COVID-19 declaration under Section 564(b)(1) of the Act, 21 U.S.C.section 360bbb-3(b)(1), unless the authorization is terminated  or revoked sooner.        Influenza A by PCR NEGATIVE NEGATIVE Final   Influenza B by PCR NEGATIVE NEGATIVE Final    Comment: (NOTE) The Xpert Xpress SARS-CoV-2/FLU/RSV plus assay is intended as an aid in the diagnosis of influenza from Nasopharyngeal swab specimens and should not be used as a sole basis for treatment. Nasal washings and aspirates are unacceptable for Xpert Xpress SARS-CoV-2/FLU/RSV testing.  Fact Sheet for Patients: BloggerCourse.com  Fact Sheet for Healthcare Providers: SeriousBroker.it  This test is not yet approved or cleared by the [place] FDA and has been authorized for detection and/or diagnosis of SARS-CoV-2 by FDA under an Emergency Use Authorization (EUA). This EUA will remain in effect (meaning this test can be used) for the duration of the COVID-19 declaration under Section 564(b)(1) of the Act, 21 U.S.C. section 360bbb-3(b)(1), unless the authorization is terminated or revoked.  Performed at [facility], [address], [place], [place] [address]   C Difficile Quick Screen w PCR reflex     Status: None   Collection Time: 01/19/21  6:13 AM   Specimen: STOOL  Result Value Ref Range Status   C Diff antigen NEGATIVE NEGATIVE Final   C Diff toxin NEGATIVE NEGATIVE Final   C Diff interpretation No C. difficile detected.  Final    Comment: Performed at [facility], [address] [place], [place] [address]      Studies: No results found.  Scheduled Meds:  aspirin EC  81 mg Oral BID   feeding supplement  1 Container Oral TID BM   heparin  5,000 Units Subcutaneous Q8H   montelukast  10 mg Oral QHS   PHENobarbital  129.6 mg Oral QHS   potassium chloride  40 mEq Oral BID   pramipexole  0.5 mg Oral QPM   rosuvastatin  20 mg Oral Daily   saccharomyces boulardii  250 mg Oral BID   thiamine  100 mg Oral Daily    Continuous Infusions:  potassium chloride        LOS: 9 days     Zannie Cove,  MD [facility]   01/28/2021, 12:00 PM

## 2021-01-28 NOTE — Progress Notes (Signed)
Physical Therapy Treatment Patient Details Name: [name] MRN: [id number] DOB: 1950-05-30 Today's Date: 01/28/2021   History of Present Illness Pt is a 70 y.o. F who presents with worsening creatinine, elevated potassium, history of ongoing diarrhea. Past medical history of chronic lower extremity lymphedema, anemia, migraine, OSA, restless leg syndrome, seizure disorder.    PT Comments    Pt progressing towards her physical therapy goals. She is pleasant and motivated to participate in therapy session. Focused on therapeutic exercises for strengthening and transfer training utilizing Corene Cornea. Pt able to perform a total of 4 stands up to [place] with min assist. Suspect good progress given age, motivation and PLOF. Recommend CIR to address deficits and maximize functional mobility.     Recommendations for follow up therapy are one component of a multi-disciplinary discharge planning process, led by the attending physician.  Recommendations may be updated based on patient status, additional functional criteria and insurance authorization.  Follow Up Recommendations  CIR     Equipment Recommendations  Wheelchair (measurements PT);Wheelchair cushion (measurements PT)    Recommendations for Other Services       Precautions / Restrictions Precautions Precautions: Fall Restrictions Weight Bearing Restrictions: No     Mobility  Bed Mobility Overal bed mobility: Needs Assistance Bed Mobility: Supine to Sit     Supine to sit: Min assist     General bed mobility comments: Pt initiating BLE's off edge of bed, heavy use of arms on railing, minA with use of bed pad to manuever    Transfers Overall transfer level: Needs assistance Equipment used: Ambulation equipment used Transfers: Sit to/from Stand Sit to Stand: Min assist;From elevated surface         General transfer comment: MinA to rise from edge of bed to Stedy x 4, cues for technique and use of  momentum  Ambulation/Gait                 Stairs             Wheelchair Mobility    Modified Rankin (Stroke Patients Only)       Balance Overall balance assessment: Needs assistance Sitting-balance support: No upper extremity supported;Feet supported Sitting balance-Leahy Scale: Fair                                      Cognition Arousal/Alertness: Awake/alert Behavior During Therapy: WFL for tasks assessed/performed;Anxious Overall Cognitive Status: Impaired/Different from baseline Area of Impairment: Memory;Problem solving                     Memory: Decreased short-term memory       Problem Solving: Slow processing;Difficulty sequencing;Requires verbal cues;Requires tactile cues        Exercises General Exercises - Lower Extremity Ankle Circles/Pumps: Both;20 reps;Supine Quad Sets: Both;15 reps;Supine Long Arc Quad: Both;10 reps;Seated Hip Flexion/Marching: Both;10 reps;Seated    General Comments        Pertinent Vitals/Pain Pain Assessment: Faces Faces Pain Scale: Hurts a little bit Pain Location: IV site where K+ was being received Pain Descriptors / Indicators: Burning Pain Intervention(s): Monitored during session;Ice applied    Home Living                      Prior Function            PT Goals (current goals can now be found in the  care plan section) Acute Rehab PT Goals Patient Stated Goal: to reduce diarrhea Potential to Achieve Goals: Good Progress towards PT goals: Progressing toward goals    Frequency    Min 3X/week      PT Plan Frequency needs to be updated    Co-evaluation              AM-PAC PT "6 Clicks" Mobility   Outcome Measure  Help needed turning from your back to your side while in a flat bed without using bedrails?: A Little Help needed moving from lying on your back to sitting on the side of a flat bed without using bedrails?: A Little Help needed moving to  and from a bed to a chair (including a wheelchair)?: A Lot Help needed standing up from a chair using your arms (e.g., wheelchair or bedside chair)?: A Lot Help needed to walk in hospital room?: Total Help needed climbing 3-5 steps with a railing? : Total 6 Click Score: 12    End of Session Equipment Utilized During Treatment: Gait belt Activity Tolerance: Patient tolerated treatment well Patient left: in chair;with call bell/phone within reach;with family/visitor present Nurse Communication: Mobility status PT Visit Diagnosis: Muscle weakness (generalized) (M62.81);Other abnormalities of gait and mobility (R26.89);Other symptoms and signs involving the nervous system (R29.898) Pain - Right/Left: Right Pain - part of body: Ankle and joints of foot     Time: 3382-5053 PT Time Calculation (min) (ACUTE ONLY): 31 min  Charges:  $Therapeutic Exercise: 8-22 mins $Therapeutic Activity: 8-22 mins                     Lillia Pauls, PT, DPT Acute Rehabilitation Services Pager [phone] Office [phone]    Norval Morton 01/28/2021, 1:02 PM

## 2021-01-28 NOTE — Progress Notes (Signed)
Patient ID: [name], female   DOB: Dec 25, 1950, 70 y.o.   MRN: [id number]  [facility] Surgery Progress Note     Subjective: CC-  Feeling much better. Patient reports having several loose Bms yesterday and passing a lot of flatus. Abdominal distension improved. Denies n/v. Tolerating clears. Does not have much of an appetite.  Objective: Vital signs in last 24 hours: Temp:  [98 F (36.7 C)-98.8 F (37.1 C)] 98.2 F (36.8 C) (09/20 0508) Pulse Rate:  [80-94] 84 (09/20 0508) Resp:  [16-18] 18 (09/20 0508) BP: (106-138)/(56-72) 106/72 (09/20 0508) SpO2:  [91 %-94 %] 91 % (09/20 0508) Weight:  [409 kg] 123 kg (09/19 2027) Last BM Date: 01/27/21  Intake/Output from previous day: 09/19 0701 - 09/20 0700 In: 1480 [P.O.:1480] Out: 120 [Urine:120] Intake/Output this shift: No intake/output data recorded.  PE: Gen:  Alert, NAD, pleasant Pulm:  rate and effort normal Abd: soft, minimal distension, NT  Lab Results:  Recent Labs    01/27/21 0834 01/28/21 0516  WBC 14.7* 11.2*  HGB 12.4 11.5*  HCT 37.2 33.8*  PLT 299 270   BMET Recent Labs    01/27/21 0834 01/28/21 0516  NA 134* 133*  K 3.3* 2.7*  CL 101 96*  CO2 23 23  GLUCOSE 111* 103*  BUN 14 16  CREATININE 0.89 0.76  CALCIUM 9.2 9.3   PT/INR No results for input(s): LABPROT, INR in the last 72 hours. CMP     Component Value Date/Time   NA 133 (L) 01/28/2021 0516   NA 136 01/04/2019 0924   K 2.7 (LL) 01/28/2021 0516   CL 96 (L) 01/28/2021 0516   CO2 23 01/28/2021 0516   GLUCOSE 103 (H) 01/28/2021 0516   BUN 16 01/28/2021 0516   BUN 36 (H) 01/04/2019 0924   CREATININE 0.76 01/28/2021 0516   CREATININE 0.75 01/01/2016 1404   CALCIUM 9.3 01/28/2021 0516   PROT 7.0 01/27/2021 0834   PROT 7.5 01/04/2019 0924   ALBUMIN 2.6 (L) 01/27/2021 0834   ALBUMIN 4.2 01/04/2019 0924   AST 21 01/27/2021 0834   ALT 22 01/27/2021 0834   ALKPHOS 81 01/27/2021 0834   BILITOT 0.6 01/27/2021 0834   BILITOT 0.3  01/04/2019 0924   GFRNONAA >60 01/28/2021 0516   GFRAA 73 01/04/2019 0924   Lipase     Component Value Date/Time   LIPASE <10 (L) 01/08/2021 2148       Studies/Results: CT ABDOMEN PELVIS WO CONTRAST  Result Date: 01/27/2021 CLINICAL DATA:  Abdominal pain, acute, nonlocalized EXAM: CT ABDOMEN AND PELVIS WITHOUT CONTRAST TECHNIQUE: Multidetector CT imaging of the abdomen and pelvis was performed following the standard protocol without IV contrast. COMPARISON:  None. FINDINGS: Lower chest: The visualized lung bases are clear bilaterally. The visualized heart and pericardium are unremarkable. Hepatobiliary: The visualized liver is unremarkable. Gallbladder unremarkable. No intra or extrahepatic biliary ductal dilation. Pancreas: Unremarkable Spleen: Unremarkable Adrenals/Urinary Tract: No adrenal hemorrhage or renal injury identified. Bladder is unremarkable. Stomach/Bowel: There are several prominent gas and fluid-filled loops of large and small bowel without a focal point of transition identified most in keeping with an adynamic ileus. Nasogastric tube tip is seen within the distal body of the stomach. No superimposed evidence of focal inflammation. Trace free intraperitoneal fluid. No free intraperitoneal gas. The appendix is unremarkable. Vascular/Lymphatic: Mild aortoiliac atherosclerotic calcification. No aortic aneurysm. No pathologic adenopathy within the abdomen and pelvis. Reproductive: Status post hysterectomy. No adnexal masses. Other: No abdominal wall hernia.  Rectum  unremarkable. Musculoskeletal: Advanced degenerative changes are noted within the right hip. Moderate degenerative changes noted within the left hip. Mild degenerative changes noted within the lumbar spine. No acute bone abnormality. IMPRESSION: Findings most in keeping with an adynamic ileus. Nasogastric tube tip within the distal body of the stomach. Mild ascites, nonspecific. Severe asymmetric right hip degenerative  arthritis. Aortic Atherosclerosis (ICD10-I70.0). Electronically Signed   By: Helyn Numbers M.D.   On: 01/27/2021 02:31   DG Abd 2 Views  Result Date: 01/26/2021 CLINICAL DATA:  Generalized abdominal pain and distension. EXAM: ABDOMEN - 2 VIEW COMPARISON:  None. FINDINGS: Small bowel loops are diffusely air-filled and dilated. There is also gaseous distention of the colon. Some colonic loops appear dilated as well. No suspicious calcifications are seen. There is some reticular opacities in lung bases. IMPRESSION: 1. Diffusely dilated small and large bowel. Findings are concerning for severe ileus. A bowel obstruction cannot be excluded. Electronically Signed   By: Darliss Cheney M.D.   On: 01/26/2021 15:48   DG Abd Portable 1V  Result Date: 01/26/2021 CLINICAL DATA:  Nasogastric tube placement EXAM: PORTABLE ABDOMEN - 1 VIEW COMPARISON:  None. FINDINGS: Esophageal catheter tip and side port are in the distal stomach. Incompletely visualized dilated loops of bowel in the upper abdomen. IMPRESSION: Esophageal catheter tip and side port in the distal stomach. Electronically Signed   By: Deatra Robinson M.D.   On: 01/26/2021 20:34    Anti-infectives: Anti-infectives (From admission, onward)    Start     Dose/Rate Route Frequency Ordered Stop   01/19/21 1000  doxycycline (VIBRAMYCIN) 50 MG capsule 50 mg  Status:  Discontinued        50 mg Oral Daily 01/19/21 0256 01/20/21 1310        Assessment/Plan Adynamic ileus - Ileus resolving. Advance to full liquids and advance diet as tolerated. Add Boost. Continue to monitor electrolytes and keep K>4 and Mag>2. Mobilize at able. General surgery will sign off, please call with questions or concerns.   ID - none currently FEN - FLD VTE - sq heparin Foley - none   Morbid obesity Chronic bilateral lower extremity lymphedema Chronic back and lower extremity pain Seizures OSA noncompliant with CPAP HTN SAH s/p craniotomy and aneurysm clipping in  1986   LOS: 9 days    Franne Forts, [facility] Surgery 01/28/2021, 8:32 AM Please see Amion for pager number during day hours 7:00am-4:30pm

## 2021-01-28 NOTE — Consult Note (Signed)
   [facility] Inpatient Consult   01/28/2021  [name] 14-Jun-1950 [id number]  [facility] [[facility]]  Accountable Care Organization [ACO] Patient: [facility] Medicare  Patient screened for hospitalization length of stay 9 days to assess for potential [facility]  [[facility]] Care Management service needs for post hospital transition.  Review of patient's medical record reveals patient is currently being recommended by PT for CIR.   Plan:  Continue to follow progress and disposition to assess for post hospital care management needs.    For questions contact:   Charlesetta Shanks, RN BSN CCM [facility]  [phone] business mobile phone Toll free office [phone]  Fax number: [phone] [email or web address]

## 2021-01-28 NOTE — Progress Notes (Signed)
Pt stated she could place herself on home cpap unit when ready for bed.

## 2021-01-29 DIAGNOSIS — Z4659 Encounter for fitting and adjustment of other gastrointestinal appliance and device: Secondary | ICD-10-CM | POA: Diagnosis not present

## 2021-01-29 DIAGNOSIS — N179 Acute kidney failure, unspecified: Secondary | ICD-10-CM | POA: Diagnosis not present

## 2021-01-29 LAB — BASIC METABOLIC PANEL
Anion gap: 11 (ref 5–15)
BUN: 13 mg/dL (ref 8–23)
CO2: 21 mmol/L — ABNORMAL LOW (ref 22–32)
Calcium: 9.1 mg/dL (ref 8.9–10.3)
Chloride: 102 mmol/L (ref 98–111)
Creatinine, Ser: 0.75 mg/dL (ref 0.44–1.00)
GFR, Estimated: >60 mL/min (ref >60)
Glucose, Bld: 101 mg/dL — ABNORMAL HIGH (ref 70–99)
Potassium: 3.1 mmol/L — ABNORMAL LOW (ref 3.5–5.1)
Sodium: 134 mmol/L — ABNORMAL LOW (ref 135–145)

## 2021-01-29 LAB — CBC
HCT: 32.3 % — ABNORMAL LOW (ref 36.0–46.0)
Hemoglobin: 11.2 g/dL — ABNORMAL LOW (ref 12.0–15.0)
MCH: 32 pg (ref 26.0–34.0)
MCHC: 34.7 g/dL (ref 30.0–36.0)
MCV: 92.3 fL (ref 80.0–100.0)
Platelets: 236 10*3/uL (ref 150–400)
RBC: 3.5 MIL/uL — ABNORMAL LOW (ref 3.87–5.11)
RDW: 13.4 % (ref 11.5–15.5)
WBC: 8.9 10*3/uL (ref 4.0–10.5)
nRBC: 0 % (ref 0.0–0.2)

## 2021-01-29 MED ORDER — POTASSIUM CHLORIDE 20 MEQ PO PACK
40.0000 meq | PACK | ORAL | Status: AC
Start: 2021-01-29 — End: 2021-01-29
  Administered 2021-01-29 (×2): 40 meq via ORAL
  Filled 2021-01-29 (×2): qty 2

## 2021-01-29 NOTE — Progress Notes (Signed)
Physical Therapy Treatment Patient Details Name: [name] MRN: [id number] DOB: May 03, 1951 Today's Date: 01/29/2021   History of Present Illness Pt is a 70 y.o. F who presents with worsening creatinine, elevated potassium, history of ongoing diarrhea. Past medical history of chronic lower extremity lymphedema, anemia, migraine, OSA, restless leg syndrome, seizure disorder.    PT Comments    Patient is progressing very well towards their physical therapy goals. Able to progress from using a Stedy to a walker. Requiring min-mod assist for functional mobility. Ambulating 10 feet with a walker, utilizing a step to pattern and chair follow. Continue to recommend CIR to address deficits, maximize functional mobility and decrease caregiver burden.     Recommendations for follow up therapy are one component of a multi-disciplinary discharge planning process, led by the attending physician.  Recommendations may be updated based on patient status, additional functional criteria and insurance authorization.  Follow Up Recommendations  CIR     Equipment Recommendations  None recommended by PT (has needed DME)    Recommendations for Other Services       Precautions / Restrictions Precautions Precautions: Fall Restrictions Weight Bearing Restrictions: No     Mobility  Bed Mobility Overal bed mobility: Needs Assistance Bed Mobility: Supine to Sit;Sit to Supine     Supine to sit: Min guard Sit to supine: Mod assist   General bed mobility comments: Pt progressing to edge of bed without physical assist, cues provided for technique and use of bed rail. Increased time/effort. Requiring modA for assist of BLE's back into bed    Transfers Overall transfer level: Needs assistance Equipment used: Rolling walker (2 wheeled) Transfers: Sit to/from Stand Sit to Stand: Min assist;Mod assist         General transfer comment: MinA to rise from edge of bed, modA from recliner. Cues for hand  placement  Ambulation/Gait Ambulation/Gait assistance: Min assist Gait Distance (Feet): 10 Feet Assistive device: Rolling walker (2 wheeled) Gait Pattern/deviations: Step-to pattern;Decreased stride length;Trunk flexed Gait velocity: decreased Gait velocity interpretation: <1.31 ft/sec, indicative of household ambulator General Gait Details: Pt with effortful step to pattern, heavy use of BUE's, cues provided for walker proximity. Chair follow brought behind pt   Stairs             Wheelchair Mobility    Modified Rankin (Stroke Patients Only)       Balance Overall balance assessment: Needs assistance Sitting-balance support: No upper extremity supported;Feet supported Sitting balance-Leahy Scale: Fair     Standing balance support: Bilateral upper extremity supported Standing balance-Leahy Scale: Poor Standing balance comment: reliant on BUE support                            Cognition Arousal/Alertness: Awake/alert Behavior During Therapy: WFL for tasks assessed/performed Overall Cognitive Status: Impaired/Different from baseline Area of Impairment: Memory;Problem solving                 Orientation Level: Time   Memory: Decreased short-term memory Following Commands: Follows one step commands consistently Safety/Judgement: Decreased awareness of safety;Decreased awareness of deficits   Problem Solving: Slow processing;Difficulty sequencing;Requires verbal cues;Requires tactile cues General Comments: Not oriented to day of the week; states she now knows it's 2022 but earlier today thought it was "when the Titanic sailed."      Exercises General Exercises - Lower Extremity Ankle Circles/Pumps: Both;15 reps;Supine Quad Sets: 15 reps;Both;Supine Heel Slides: AROM;AAROM;5 reps;Both;Supine    General Comments  Pertinent Vitals/Pain Pain Assessment: Faces Faces Pain Scale: Hurts little more Pain Location: RLE with knee flexion Pain  Descriptors / Indicators: Sharp Pain Intervention(s): Monitored during session    Home Living                      Prior Function            PT Goals (current goals can now be found in the care plan section) Acute Rehab PT Goals Patient Stated Goal: to go home Potential to Achieve Goals: Good Progress towards PT goals: Progressing toward goals    Frequency    Min 3X/week      PT Plan Current plan remains appropriate    Co-evaluation              AM-PAC PT "6 Clicks" Mobility   Outcome Measure  Help needed turning from your back to your side while in a flat bed without using bedrails?: A Little Help needed moving from lying on your back to sitting on the side of a flat bed without using bedrails?: A Little Help needed moving to and from a bed to a chair (including a wheelchair)?: A Little Help needed standing up from a chair using your arms (e.g., wheelchair or bedside chair)?: A Lot Help needed to walk in hospital room?: A Little Help needed climbing 3-5 steps with a railing? : Total 6 Click Score: 15    End of Session Equipment Utilized During Treatment: Gait belt Activity Tolerance: Patient tolerated treatment well Patient left: in bed;with call bell/phone within reach;with bed alarm set;with family/visitor present Nurse Communication: Mobility status PT Visit Diagnosis: Muscle weakness (generalized) (M62.81);Other abnormalities of gait and mobility (R26.89);Other symptoms and signs involving the nervous system (R29.898) Pain - Right/Left: Right Pain - part of body: Leg     Time: 7591-6384 PT Time Calculation (min) (ACUTE ONLY): 31 min  Charges:  $Therapeutic Activity: 23-37 mins                     Lillia Pauls, PT, DPT Acute Rehabilitation Services Pager [phone] Office [phone]    Norval Morton 01/29/2021, 5:15 PM

## 2021-01-29 NOTE — Progress Notes (Signed)
PROGRESS NOTE  [name] IOE:[id number] DOB: [birth year] DOA: 01/18/2021 PCP: [name], MD  HPI/Recap of past 70 hours: 70 year old female with past medical history significant for chronic lower extremity lymphedema right worse than left, anemia, migraines, obstructive sleep apnea, restless leg syndrome, seizure disorder , was recently discharged from [facility] to rehab after treatment for sepsis and right lower extremity cellulitis, 4 days after discharge labs in rehab noted worsening creatinine up to 3 and elevated potassium, subsequently sent back to the emergency room , history of ongoing diarrhea, denied any abdominal pain nausea or vomiting, also had some confusion on admission. -After admission she was hydrated, diuretics and ARB were held, gabapentin was held as well, mental status and kidney function improved, subsequently had recurrence of chronic diarrhea, infectious work-up was negative, she was given a dose of Imodium. -This was followed by constipation, nausea vomiting and abdominal distention, developed ileus, requiring NG tube placement -Dr. Barrie Folk consulted general surgery 9/18  Assessment/Plan:  Ileus -CT abdomen pelvis noted dilated loops of small and large intestine,, NG tube was placed to intermittent suction -Patient pulled out NG tube, now having BMs, abdomen less distended,  -Tolerating liquids, having bowel movements -Advance to soft diet today, replace potassium -Discharge planning, PT OT, plan for CIR noted  AKI/hyperkalemia on admission -Resolved, treated with hydration, ARB and diuretics were held  Severe hypokalemia -Due to GI losses, multiple BMs overnight with resolving ileus -Improving, replace again today  Metabolic encephalopathy -Secondary to AKI, decreased gabapentin clearance, improved -hold off on Gabapentin for now  History of seizure disorder -Continue phenobarbital  Obesity/OSA -Continue CPAP nightly  Pressure injury  -anterior right  deep tissue pressure injury, poa -continue wound care  Chronic diarrhea -Infectious work-up negative, will need gastroenterology follow-up -Now with resolving ileus as above  Chronic lower extremity lymphedema Chronic dermatitis  DVT prophylaxis: Heparin subcu  Code Status: Full Family Communication: Discussed with patient in detail, no family at bedside  Disposition Plan: CIR Status is: Inpatient   Dispo: The patient is from: Home              Anticipated d/c is to: CIR              Anticipated d/c date is: 1 to 2 days              Patient currently not medically stable for discharge  Consultants: Neurology General surgery  Procedures: EEG  Antimicrobials: None Was on doxycycline for rosacea but that has been held   Objective: Vitals:   01/28/21 1735 01/28/21 2055 01/29/21 0529 01/29/21 0724  BP: 120/67 114/62 (!) 112/59 (!) 128/58  Pulse: (!) 104 94 77 76  Resp: 18 18 18 20   Temp: 98.4 F (36.9 C) 98.1 F (36.7 C) 98 F (36.7 C) 98 F (36.7 C)  TempSrc: Oral Oral Oral Oral  SpO2: 95% 96%  95%  Weight:  122.8 kg    Height:        Intake/Output Summary (Last 24 hours) at 01/29/2021 1129 Last data filed at 01/29/2021 0747 Gross per 24 hour  Intake 1277 ml  Output 400 ml  Net 877 ml   Filed Weights   01/25/21 0516 01/27/21 2027 01/28/21 2055  Weight: 125.5 kg 123 kg 122.8 kg   Body mass index is 49.52 kg/m.  Exam:  Gen: Obese chronically ill female, appears older than stated age, AAOx 2, no distress HEENT: Neck obese unable to assess JVD CVS: S1-S2, regular rate rhythm Lungs:  Decreased breath sounds the bases Abdomen: Soft, obese, mildly distended, nontender, bowel sounds present  Extremities: Dry, thickened scaly puckered skin worse on the right    Data Reviewed: CBC: Recent Labs  Lab 01/23/21 0350 01/26/21 1818 01/27/21 0834 01/28/21 0516 01/29/21 0403  WBC 9.2 12.8* 14.7* 11.2* 8.9  NEUTROABS 6.1  --   --   --   --   HGB 10.2* 13.2  12.4 11.5* 11.2*  HCT 30.5* 38.0 37.2 33.8* 32.3*  MCV 95.9 92.2 93.2 92.3 92.3  PLT 305 326 299 270 236   Basic Metabolic Panel: Recent Labs  Lab 01/23/21 0350 01/26/21 1818 01/27/21 0834 01/28/21 0516 01/29/21 0403  NA 137 137 134* 133* 134*  K 4.0 3.1* 3.3* 2.7* 3.1*  CL 107 101 101 96* 102  CO2 23 24 23 23  21*  GLUCOSE 95 121* 111* 103* 101*  BUN 9 12 14 16 13   CREATININE 0.80 0.85 0.89 0.76 0.75  CALCIUM 9.0 9.8 9.2 9.3 9.1   GFR: Estimated Creatinine Clearance: 83 mL/min (by C-G formula based on SCr of 0.75 mg/dL). Liver Function Tests: Recent Labs  Lab 01/23/21 0350 01/27/21 0834  AST 29 21  ALT 33 22  ALKPHOS 79 81  BILITOT 0.5 0.6  PROT 6.7 7.0  ALBUMIN 2.6* 2.6*   No results for input(s): LIPASE, AMYLASE in the last 168 hours. No results for input(s): AMMONIA in the last 168 hours.  Coagulation Profile: No results for input(s): INR, PROTIME in the last 168 hours. Cardiac Enzymes: No results for input(s): CKTOTAL, CKMB, CKMBINDEX, TROPONINI in the last 168 hours. BNP (last 3 results) No results for input(s): PROBNP in the last 8760 hours. HbA1C: No results for input(s): HGBA1C in the last 72 hours. CBG: No results for input(s): GLUCAP in the last 168 hours. Lipid Profile: No results for input(s): CHOL, HDL, LDLCALC, TRIG, CHOLHDL, LDLDIRECT in the last 72 hours. Thyroid Function Tests: No results for input(s): TSH, T4TOTAL, FREET4, T3FREE, THYROIDAB in the last 72 hours. Anemia Panel: No results for input(s): VITAMINB12, FOLATE, FERRITIN, TIBC, IRON, RETICCTPCT in the last 72 hours. Urine analysis:    Component Value Date/Time   COLORURINE YELLOW 01/19/2021 0000   APPEARANCEUR CLEAR 01/19/2021 0000   LABSPEC 1.015 01/19/2021 0000   PHURINE 5.5 01/19/2021 0000   GLUCOSEU NEGATIVE 01/19/2021 0000   HGBUR NEGATIVE 01/19/2021 0000   BILIRUBINUR NEGATIVE 01/19/2021 0000   KETONESUR NEGATIVE 01/19/2021 0000   PROTEINUR NEGATIVE 01/19/2021 0000    NITRITE NEGATIVE 01/19/2021 0000   LEUKOCYTESUR NEGATIVE 01/19/2021 0000   Sepsis Labs: @LABRCNTIP (procalcitonin:4,lacticidven:4)  ) No results found for this or any previous visit (from the past 240 hour(s)).     Studies: No results found.  Scheduled Meds:  aspirin EC  81 mg Oral BID   feeding supplement  1 Container Oral TID BM   heparin  5,000 Units Subcutaneous Q8H   montelukast  10 mg Oral QHS   PHENobarbital  129.6 mg Oral QHS   potassium chloride  40 mEq Oral Q4H   pramipexole  0.5 mg Oral QPM   rosuvastatin  20 mg Oral Daily   thiamine  100 mg Oral Daily    Continuous Infusions:  LOS: 10 days     03/21/2021, MD [facility]   01/29/2021, 11:29 AM

## 2021-01-29 NOTE — Progress Notes (Signed)
Patient stated she will place herself on cpap when she is ready for bed.

## 2021-01-29 NOTE — Progress Notes (Signed)
OCCUPATIONAL THERAPY TREATMENT  Pt. Was seen to increase pt. Ability to follow directions and preform ADLs. Pt. Was less confused today and knew she was at [facility] [place]. Pt. Was able to read calender on wall to find the month. Pt. Is improving with ADLs and transfers. Acute OT to follow.     01/29/21 0903  OT Visit Information  Last OT Received On 01/29/21  Assistance Needed +2  History of Present Illness Pt is a 70 y.o. F who presents with worsening creatinine, elevated potassium, history of ongoing diarrhea. Past medical history of chronic lower extremity lymphedema, anemia, migraine, OSA, restless leg syndrome, seizure disorder.  Precautions  Precautions Fall  Pain Assessment  Pain Assessment 0-10  Pain Score 3  Pain Location abdomen  Pain Descriptors / Indicators Sharp  Pain Intervention(s) Monitored during session;Patient requesting pain meds-RN notified  Cognition  Arousal/Alertness Awake/alert  Behavior During Therapy WFL for tasks assessed/performed  Overall Cognitive Status Impaired/Different from baseline  Area of Impairment Memory;Problem solving  Orientation Level Situation;Time  Memory Decreased short-term memory  Following Commands Follows one step commands consistently  Safety/Judgement Decreased awareness of safety;Decreased awareness of deficits  Problem Solving Slow processing;Difficulty sequencing;Requires verbal cues;Requires tactile cues  General Comments Pt. was cooperative but not oriented. Able to follow directions.  Upper Extremity Assessment  Upper Extremity Assessment Generalized weakness  Lower Extremity Assessment  Lower Extremity Assessment Defer to PT evaluation  ADL  Overall ADL's  Needs assistance/impaired  Eating/Feeding Set up;Sitting  Grooming Sitting;Oral care;Supervision/safety;Set up  Upper Body Bathing Minimal assistance;Sitting  Lower Body Bathing Total assistance;+2 for physical assistance;Sit to/from stand  Upper Body Dressing   Minimal assistance;Sitting  Lower Body Dressing Total assistance;+2 for physical assistance  Toilet Transfer Moderate assistance;+2 for physical assistance;+2 for safety/equipment;RW  Toileting- Clothing Manipulation and Hygiene Total assistance;+2 for safety/equipment  Functional mobility during ADLs +2 for physical assistance;Moderate assistance  General ADL Comments Pt. confusion appears improved.  Balance  Sitting balance-Leahy Scale Fair  Standing balance-Leahy Scale Poor  Restrictions  Weight Bearing Restrictions No  Vision- Assessment  Vision Assessment? No apparent visual deficits  Transfers  Overall transfer level Needs assistance  Sit to Stand Mod assist (from recliner)  Stand pivot transfers Mod assist  General transfer comment cues for proper hand placement.  OT - End of Session  Equipment Utilized During Treatment Gait belt  Activity Tolerance Patient tolerated treatment well  Patient left in chair;with call bell/phone within reach;with chair alarm set  Nurse Communication  (ok therapy)  OT Assessment/Plan  OT Plan Discharge plan remains appropriate  OT Visit Diagnosis Unsteadiness on feet (R26.81);Muscle weakness (generalized) (M62.81);Other symptoms and signs involving cognitive function  OT Frequency (ACUTE ONLY) Min 2X/week  Follow Up Recommendations SNF  AM-PAC OT "6 Clicks" Daily Activity Outcome Measure (Version 2)  Help from another person eating meals? 4  Help from another person taking care of personal grooming? 3  Help from another person toileting, which includes using toliet, bedpan, or urinal? 1  Help from another person bathing (including washing, rinsing, drying)? 2  Help from another person to put on and taking off regular upper body clothing? 3  Help from another person to put on and taking off regular lower body clothing? 1  6 Click Score 14  Progressive Mobility  Mobility Out of bed for toileting;Ambulated with assistance in room  OT Goal  Progression  Progress towards OT goals Progressing toward goals  Acute Rehab OT Goals  Patient Stated Goal to go  home  OT Goal Formulation With patient/family  Time For Goal Achievement 02/04/21  Potential to Achieve Goals Good  ADL Goals  Pt Will Perform Grooming with set-up;sitting  Pt Will Perform Upper Body Bathing with set-up;sitting  Pt Will Perform Lower Body Bathing with min assist;sit to/from stand  Pt Will Perform Upper Body Dressing with set-up;sitting  Pt Will Perform Lower Body Dressing with min assist;sit to/from stand  Pt Will Transfer to Toilet with min assist;ambulating  Pt Will Perform Toileting - Clothing Manipulation and hygiene with min assist;sit to/from stand  OT Time Calculation  OT Start Time (ACUTE ONLY) 0920  OT Stop Time (ACUTE ONLY) 0958  OT Time Calculation (min) 38 min  OT General Charges  $OT Visit 1 Visit  OT Treatments  $Self Care/Home Management  23-37 mins  $Therapeutic Activity 8-22 mins  Derrek Gu OT/L

## 2021-01-30 DIAGNOSIS — G4733 Obstructive sleep apnea (adult) (pediatric): Secondary | ICD-10-CM | POA: Diagnosis not present

## 2021-01-30 DIAGNOSIS — N179 Acute kidney failure, unspecified: Secondary | ICD-10-CM | POA: Diagnosis not present

## 2021-01-30 DIAGNOSIS — Z9989 Dependence on other enabling machines and devices: Secondary | ICD-10-CM | POA: Diagnosis not present

## 2021-01-30 LAB — BASIC METABOLIC PANEL
Anion gap: 9 (ref 5–15)
BUN: 10 mg/dL (ref 8–23)
CO2: 22 mmol/L (ref 22–32)
Calcium: 9.2 mg/dL (ref 8.9–10.3)
Chloride: 104 mmol/L (ref 98–111)
Creatinine, Ser: 0.71 mg/dL (ref 0.44–1.00)
GFR, Estimated: >60 mL/min (ref >60)
Glucose, Bld: 94 mg/dL (ref 70–99)
Potassium: 2.9 mmol/L — ABNORMAL LOW (ref 3.5–5.1)
Sodium: 135 mmol/L (ref 135–145)

## 2021-01-30 LAB — CBC
HCT: 31.7 % — ABNORMAL LOW (ref 36.0–46.0)
Hemoglobin: 10.6 g/dL — ABNORMAL LOW (ref 12.0–15.0)
MCH: 31.2 pg (ref 26.0–34.0)
MCHC: 33.4 g/dL (ref 30.0–36.0)
MCV: 93.2 fL (ref 80.0–100.0)
Platelets: 254 10*3/uL (ref 150–400)
RBC: 3.4 MIL/uL — ABNORMAL LOW (ref 3.87–5.11)
RDW: 13.6 % (ref 11.5–15.5)
WBC: 7.1 10*3/uL (ref 4.0–10.5)
nRBC: 0 % (ref 0.0–0.2)

## 2021-01-30 LAB — MAGNESIUM: Magnesium: 1.4 mg/dL — ABNORMAL LOW (ref 1.7–2.4)

## 2021-01-30 MED ORDER — MAGNESIUM SULFATE 2 GM/50ML IV SOLN
2.0000 g | Freq: Once | INTRAVENOUS | Status: AC
  Administered 2021-01-30: 2 g via INTRAVENOUS
  Filled 2021-01-30: qty 50

## 2021-01-30 MED ORDER — DIPHENHYDRAMINE-ZINC ACETATE 2-0.1 % EX CREA
TOPICAL_CREAM | Freq: Two times a day (BID) | CUTANEOUS | Status: DC | PRN
  Filled 2021-01-30: qty 28

## 2021-01-30 MED ORDER — POTASSIUM CHLORIDE 20 MEQ PO PACK
40.0000 meq | PACK | ORAL | Status: AC
  Administered 2021-01-30 (×3): 40 meq via ORAL
  Filled 2021-01-30 (×3): qty 2

## 2021-01-30 MED ORDER — CAMPHOR-MENTHOL 0.5-0.5 % EX LOTN
TOPICAL_LOTION | CUTANEOUS | Status: DC | PRN
  Filled 2021-01-30: qty 222

## 2021-01-30 NOTE — Progress Notes (Signed)
PROGRESS NOTE  [name] [id number]:[id number] DOB: [birth year] DOA: 01/18/2021 PCP: [name], MD  HPI/Recap of past 3 hours: 70 year old female with past medical history significant for chronic lower extremity lymphedema right worse than left, anemia, migraines, obstructive sleep apnea, restless leg syndrome, seizure disorder , was recently discharged from [facility] to rehab after treatment for sepsis and right lower extremity cellulitis, 4 days after discharge labs in rehab noted worsening creatinine up to 3 and elevated potassium, subsequently sent back to the emergency room.   -After admission she was hydrated, diuretics and ARB were held, gabapentin was held as well, mental status and kidney function improved, subsequently had recurrence of chronic diarrhea, infectious work-up was negative, she was given a dose of Imodium. -This was followed by constipation, nausea vomiting and abdominal distention, developed ileus, requiring NG tube placement  Assessment/Plan:  Ileus -CT abdomen pelvis noted dilated loops of small and large intestine,, NG tube was placed to intermittent suction -Patient pulled out NG tube, now having BMs -aggressively replace potassium -Discharge planning, PT OT, plan for CIR noted  AKI/hyperkalemia on admission -Resolved, treated with hydration, ARB and diuretics were held  Severe hypokalemia -Due to GI losses, multiple BMs overnight with resolving ileus -replace Mg as well  Metabolic encephalopathy -Secondary to AKI, decreased gabapentin clearance, improved -hold off on Gabapentin for now  History of seizure disorder -Continue phenobarbital  Obesity/OSA -Continue CPAP nightly  Pressure injury  -anterior right deep tissue pressure injury, poa -continue wound care  Chronic diarrhea -Infectious work-up negative, will need gastroenterology follow-up -Now with resolving ileus as above  Chronic lower extremity lymphedema Chronic dermatitis  DVT  prophylaxis: Heparin subcu  Code Status: Full Disposition Plan: CIR Status is: Inpatient   Dispo: The patient is from: Home              Anticipated d/c is to: CIR                Consultants: Neurology General surgery  Procedures: EEG    Objective: Vitals:   01/29/21 1811 01/29/21 2020 01/30/21 0510 01/30/21 0910  BP: (!) 121/58 (!) 125/49 (!) 104/34 (!) 118/51  Pulse: 79 80 74 73  Resp: 18 18 18 18   Temp: 98.4 F (36.9 C) 98.4 F (36.9 C) 97.8 F (36.6 C) 98 F (36.7 C)  TempSrc: Oral Oral Oral Oral  SpO2: 97% 97% 94% 95%  Weight:  123.2 kg    Height:        Intake/Output Summary (Last 24 hours) at 01/30/2021 1156 Last data filed at 01/30/2021 0900 Gross per 24 hour  Intake 534 ml  Output 0 ml  Net 534 ml   Filed Weights   01/27/21 2027 01/28/21 2055 01/29/21 2020  Weight: 123 kg 122.8 kg 123.2 kg   Body mass index is 49.68 kg/m.  Exam:   General: Appearance:    Severely obese female in no acute distress   + BS, high pitched  Lungs:     respirations unlabored  Heart:    Normal heart rate. Normal rhythm. No murmurs, rubs, or gallops.    MS:   All extremities are intact.    Neurologic:   Awake, alert, oriented x 3.         Data Reviewed: CBC: Recent Labs  Lab 01/26/21 1818 01/27/21 0834 01/28/21 0516 01/29/21 0403 01/30/21 0357  WBC 12.8* 14.7* 11.2* 8.9 7.1  HGB 13.2 12.4 11.5* 11.2* 10.6*  HCT 38.0 37.2 33.8* 32.3* 31.7*  MCV 92.2  93.2 92.3 92.3 93.2  PLT 326 299 270 236 254   Basic Metabolic Panel: Recent Labs  Lab 01/26/21 1818 01/27/21 0834 01/28/21 0516 01/29/21 0403 01/30/21 0357  NA 137 134* 133* 134* 135  K 3.1* 3.3* 2.7* 3.1* 2.9*  CL 101 101 96* 102 104  CO2 24 23 23  21* 22  GLUCOSE 121* 111* 103* 101* 94  BUN 12 14 16 13 10   CREATININE 0.85 0.89 0.76 0.75 0.71  CALCIUM 9.8 9.2 9.3 9.1 9.2  MG  --   --   --   --  1.4*   GFR: Estimated Creatinine Clearance: 83.1 mL/min (by C-G formula based on SCr of 0.71  mg/dL). Liver Function Tests: Recent Labs  Lab 01/27/21 0834  AST 21  ALT 22  ALKPHOS 81  BILITOT 0.6  PROT 7.0  ALBUMIN 2.6*   No results for input(s): LIPASE, AMYLASE in the last 168 hours. No results for input(s): AMMONIA in the last 168 hours.  Coagulation Profile: No results for input(s): INR, PROTIME in the last 168 hours. Cardiac Enzymes: No results for input(s): CKTOTAL, CKMB, CKMBINDEX, TROPONINI in the last 168 hours. BNP (last 3 results) No results for input(s): PROBNP in the last 8760 hours. HbA1C: No results for input(s): HGBA1C in the last 72 hours. CBG: No results for input(s): GLUCAP in the last 168 hours. Lipid Profile: No results for input(s): CHOL, HDL, LDLCALC, TRIG, CHOLHDL, LDLDIRECT in the last 72 hours. Thyroid Function Tests: No results for input(s): TSH, T4TOTAL, FREET4, T3FREE, THYROIDAB in the last 72 hours. Anemia Panel: No results for input(s): VITAMINB12, FOLATE, FERRITIN, TIBC, IRON, RETICCTPCT in the last 72 hours. Urine analysis:    Component Value Date/Time   COLORURINE YELLOW 01/19/2021 0000   APPEARANCEUR CLEAR 01/19/2021 0000   LABSPEC 1.015 01/19/2021 0000   PHURINE 5.5 01/19/2021 0000   GLUCOSEU NEGATIVE 01/19/2021 0000   HGBUR NEGATIVE 01/19/2021 0000   BILIRUBINUR NEGATIVE 01/19/2021 0000   KETONESUR NEGATIVE 01/19/2021 0000   PROTEINUR NEGATIVE 01/19/2021 0000   NITRITE NEGATIVE 01/19/2021 0000   LEUKOCYTESUR NEGATIVE 01/19/2021 0000    No results found for this or any previous visit (from the past 240 hour(s)).     Studies: No results found.  Scheduled Meds:  aspirin EC  81 mg Oral BID   feeding supplement  1 Container Oral TID BM   heparin  5,000 Units Subcutaneous Q8H   montelukast  10 mg Oral QHS   PHENobarbital  129.6 mg Oral QHS   potassium chloride  40 mEq Oral Q4H   pramipexole  0.5 mg Oral QPM   rosuvastatin  20 mg Oral Daily   thiamine  100 mg Oral Daily    Continuous Infusions:  LOS: 11 days      03/21/2021, DO [facility]   01/30/2021, 11:56 AM

## 2021-01-30 NOTE — Progress Notes (Signed)
IP rehab admissions - We have received a denial from [facility] for acute inpatient rehab admission.  At this point, we will need to pursue SNF placement for this patient.  Call me for questions.  [phone]

## 2021-01-30 NOTE — Plan of Care (Signed)
  Problem: Education: Goal: Knowledge of General Education information will improve Description: Including pain rating scale, medication(s)/side effects and non-pharmacologic comfort measures Outcome: Progressing   Problem: Clinical Measurements: Goal: Ability to maintain clinical measurements within normal limits will improve Outcome: Progressing   Problem: Activity: Goal: Risk for activity intolerance will decrease Outcome: Progressing   Problem: Elimination: Goal: Will not experience complications related to bowel motility Outcome: Progressing Goal: Will not experience complications related to urinary retention Outcome: Progressing   Problem: Safety: Goal: Ability to remain free from injury will improve Outcome: Progressing   

## 2021-01-31 ENCOUNTER — Ambulatory Visit: Payer: Medicare PPO | Admitting: Cardiology

## 2021-01-31 DIAGNOSIS — N179 Acute kidney failure, unspecified: Secondary | ICD-10-CM | POA: Diagnosis not present

## 2021-01-31 DIAGNOSIS — Z4659 Encounter for fitting and adjustment of other gastrointestinal appliance and device: Secondary | ICD-10-CM | POA: Diagnosis not present

## 2021-01-31 DIAGNOSIS — G4733 Obstructive sleep apnea (adult) (pediatric): Secondary | ICD-10-CM | POA: Diagnosis not present

## 2021-01-31 LAB — BASIC METABOLIC PANEL
Anion gap: 9 (ref 5–15)
BUN: 8 mg/dL (ref 8–23)
CO2: 19 mmol/L — ABNORMAL LOW (ref 22–32)
Calcium: 9.2 mg/dL (ref 8.9–10.3)
Chloride: 107 mmol/L (ref 98–111)
Creatinine, Ser: 0.73 mg/dL (ref 0.44–1.00)
GFR, Estimated: >60 mL/min (ref >60)
Glucose, Bld: 96 mg/dL (ref 70–99)
Potassium: 4 mmol/L (ref 3.5–5.1)
Sodium: 135 mmol/L (ref 135–145)

## 2021-01-31 LAB — RESP PANEL BY RT-PCR (FLU A&B, COVID) ARPGX2
Influenza A by PCR: NEGATIVE
Influenza B by PCR: NEGATIVE
SARS Coronavirus 2 by RT PCR: NEGATIVE

## 2021-01-31 NOTE — Progress Notes (Signed)
PROGRESS NOTE  [name] FKC:[id number] DOB: 1950/09/29 DOA: 01/18/2021 PCP: [name], MD  HPI/Recap of past 36 hours: 70 year old female with past medical history significant for chronic lower extremity lymphedema right worse than left, anemia, migraines, obstructive sleep apnea, restless leg syndrome, seizure disorder , was recently discharged from [facility] to rehab after treatment for sepsis and right lower extremity cellulitis, 4 days after discharge labs in rehab noted worsening creatinine up to 3 and elevated potassium, subsequently sent back to the emergency room.   -After admission she was hydrated, diuretics and ARB were held, gabapentin was held as well, mental status and kidney function improved, subsequently had recurrence of chronic diarrhea, infectious work-up was negative, she was given a dose of Imodium. -This was followed by constipation, nausea vomiting and abdominal distention, developed ileus, requiring NG tube placement CIR denied by insurance- SNF pending  Assessment/Plan:  Ileus -CT abdomen pelvis noted dilated loops of small and large intestine,, NG tube was placed to intermittent suction -Patient pulled out NG tube, now having BMs -aggressively replace potassium -BMP pending this AM  AKI/hyperkalemia on admission -Resolved, treated with hydration, ARB and diuretics were held  Severe hypokalemia -Due to GI losses, multiple BMs overnight with resolving ileus -replace Mg as well  Metabolic encephalopathy -Secondary to AKI, decreased gabapentin clearance, improved -hold off on Gabapentin for now  History of seizure disorder -Continue phenobarbital  Obesity/OSA -Continue CPAP nightly  Pressure injury  Pressure Injury 01/09/21 Buttocks Right Stage 2 -  Partial thickness loss of dermis presenting as a shallow open injury with a red, pink wound bed without slough. (Active)  01/09/21 1300  Location: Buttocks  Location Orientation: Right  Staging: Stage  2 -  Partial thickness loss of dermis presenting as a shallow open injury with a red, pink wound bed without slough.  Wound Description (Comments):   Present on Admission: Yes     Pressure Injury 01/19/21 Thigh Anterior;Right Deep Tissue Pressure Injury - Purple or maroon localized area of discolored intact skin or blood-filled blister due to damage of underlying soft tissue from pressure and/or shear. Linear DTI (Active)  01/19/21 1936  Location: Thigh  Location Orientation: Anterior;Right  Staging: Deep Tissue Pressure Injury - Purple or maroon localized area of discolored intact skin or blood-filled blister due to damage of underlying soft tissue from pressure and/or shear.  Wound Description (Comments): Linear DTI  Present on Admission: Yes     Chronic diarrhea -Infectious work-up negative, will need gastroenterology follow-up -Now with resolving ileus as above  Chronic lower extremity lymphedema Chronic dermatitis  DVT prophylaxis: Heparin subcu  Code Status: Full Disposition Plan: SNF? Status is: Inpatient   Dispo: The patient is from: Home              Anticipated d/c is to: SNF                Consultants: Neurology General surgery  Procedures: EEG    Objective: Vitals:   01/30/21 0910 01/30/21 1700 01/30/21 2017 01/31/21 0456  BP: (!) 118/51 (!) 112/55 (!) 118/48 (!) 124/50  Pulse: 73 76 77 70  Resp: 18 20 19 18   Temp: 98 F (36.7 C) 98 F (36.7 C) 98.4 F (36.9 C) 98.2 F (36.8 C)  TempSrc: Oral Oral Oral Oral  SpO2: 95% 98% 94% 93%  Weight:   122.9 kg   Height:        Intake/Output Summary (Last 24 hours) at 01/31/2021 0900 Last data filed at 01/30/2021  1700 Gross per 24 hour  Intake 410 ml  Output --  Net 410 ml   Filed Weights   01/28/21 2055 01/29/21 2020 01/30/21 2017  Weight: 122.8 kg 123.2 kg 122.9 kg   Body mass index is 49.56 kg/m.  Exam:   General: Appearance:    Severely obese female in no acute distress     Lungs:      respirations unlabored  Heart:    Normal heart rate.    MS:   All extremities are intact.    Neurologic:   Awake, alert, pleasant         Data Reviewed: CBC: Recent Labs  Lab 01/26/21 1818 01/27/21 0834 01/28/21 0516 01/29/21 0403 01/30/21 0357  WBC 12.8* 14.7* 11.2* 8.9 7.1  HGB 13.2 12.4 11.5* 11.2* 10.6*  HCT 38.0 37.2 33.8* 32.3* 31.7*  MCV 92.2 93.2 92.3 92.3 93.2  PLT 326 299 270 236 254   Basic Metabolic Panel: Recent Labs  Lab 01/26/21 1818 01/27/21 0834 01/28/21 0516 01/29/21 0403 01/30/21 0357  NA 137 134* 133* 134* 135  K 3.1* 3.3* 2.7* 3.1* 2.9*  CL 101 101 96* 102 104  CO2 24 23 23  21* 22  GLUCOSE 121* 111* 103* 101* 94  BUN 12 14 16 13 10   CREATININE 0.85 0.89 0.76 0.75 0.71  CALCIUM 9.8 9.2 9.3 9.1 9.2  MG  --   --   --   --  1.4*   GFR: Estimated Creatinine Clearance: 83 mL/min (by C-G formula based on SCr of 0.71 mg/dL). Liver Function Tests: Recent Labs  Lab 01/27/21 0834  AST 21  ALT 22  ALKPHOS 81  BILITOT 0.6  PROT 7.0  ALBUMIN 2.6*   No results for input(s): LIPASE, AMYLASE in the last 168 hours. No results for input(s): AMMONIA in the last 168 hours.  Coagulation Profile: No results for input(s): INR, PROTIME in the last 168 hours. Cardiac Enzymes: No results for input(s): CKTOTAL, CKMB, CKMBINDEX, TROPONINI in the last 168 hours. BNP (last 3 results) No results for input(s): PROBNP in the last 8760 hours. HbA1C: No results for input(s): HGBA1C in the last 72 hours. CBG: No results for input(s): GLUCAP in the last 168 hours. Lipid Profile: No results for input(s): CHOL, HDL, LDLCALC, TRIG, CHOLHDL, LDLDIRECT in the last 72 hours. Thyroid Function Tests: No results for input(s): TSH, T4TOTAL, FREET4, T3FREE, THYROIDAB in the last 72 hours. Anemia Panel: No results for input(s): VITAMINB12, FOLATE, FERRITIN, TIBC, IRON, RETICCTPCT in the last 72 hours. Urine analysis:    Component Value Date/Time   COLORURINE YELLOW  01/19/2021 0000   APPEARANCEUR CLEAR 01/19/2021 0000   LABSPEC 1.015 01/19/2021 0000   PHURINE 5.5 01/19/2021 0000   GLUCOSEU NEGATIVE 01/19/2021 0000   HGBUR NEGATIVE 01/19/2021 0000   BILIRUBINUR NEGATIVE 01/19/2021 0000   KETONESUR NEGATIVE 01/19/2021 0000   PROTEINUR NEGATIVE 01/19/2021 0000   NITRITE NEGATIVE 01/19/2021 0000   LEUKOCYTESUR NEGATIVE 01/19/2021 0000    No results found for this or any previous visit (from the past 240 hour(s)).     Studies: No results found.  Scheduled Meds:  aspirin EC  81 mg Oral BID   feeding supplement  1 Container Oral TID BM   heparin  5,000 Units Subcutaneous Q8H   montelukast  10 mg Oral QHS   PHENobarbital  129.6 mg Oral QHS   pramipexole  0.5 mg Oral QPM   rosuvastatin  20 mg Oral Daily   thiamine  100 mg Oral  Daily    Continuous Infusions:  LOS: 12 days     Joseph Art, DO [facility]   01/31/2021, 9:00 AM

## 2021-01-31 NOTE — TOC Progression Note (Addendum)
Transition of Care [facility]) - Progression Note    Patient Details  Name: WENDELL FIEBIG MRN: [id number] Date of Birth: 09/30/1950  [facility] CM/SW Contact  Okey Dupre Lazaro Arms, LCSW Phone Number: 01/31/2021, 6:12 PM  Clinical Narrative:  Talked with patient and spouse at bedside regarding her discharge plan. Mrs. Oconnor talked about going on and things she will and won't do to be safe. Her husband spoke up and voiced that he felt she is not ready to discharge and going to SNF would be best for continued rehab. Patient expressed agreement and when asked they are agreeable to returning to [facility]ena Katz, admissions director at [facility] contacted regarding patient and she can return pending insurance authorization. Authorization request submitted and CSW checked at 6:10 pm and authorization received. Berkley Harvey ID [id number]  *Effective 9/24/927 *Next review date 9/27   Patient will discharge to [facility] on Saturday, 9/24.    Requested that MD place order for COVID test.    Expected Discharge Plan: Skilled Nursing Facility Barriers to Discharge: Continued Medical Work up  Expected Discharge Plan and Services Expected Discharge Plan: Skilled Nursing Facility In-house Referral: Clinical Social Work Discharge Planning Services: CM Consult   Living arrangements for the past 2 months: Single Family Home                                       Social Determinants of Health (SDOH) Interventions    Readmission Risk Interventions No flowsheet data found.

## 2021-01-31 NOTE — Progress Notes (Signed)
Physical Therapy Treatment Patient Details Name: [name] MRN: [id number] DOB: 1950/12/03 Today's Date: 01/31/2021   History of Present Illness Pt is a 70 y.o. F who presents with worsening creatinine, elevated potassium, history of ongoing diarrhea. Past medical history of chronic lower extremity lymphedema, anemia, migraine, OSA, restless leg syndrome, seizure disorder.    PT Comments    Pt tolerates treatment well with improvement in ambulation distances. Pt demonstrates reduced physical assistance needs when transferring at this time. Pt will continue to benefit from aggressive mobilization to improve LE strength and neuromuscular endurance in order to reduce falls risk. Pt denies CIR by insurance thus PT updates recommendations to SNF.   Recommendations for follow up therapy are one component of a multi-disciplinary discharge planning process, led by the attending physician.  Recommendations may be updated based on patient status, additional functional criteria and insurance authorization.  Follow Up Recommendations  SNF (pt denied CIR by insurance)     Equipment Recommendations   (has needed DME)    Recommendations for Other Services       Precautions / Restrictions Precautions Precautions: Fall Restrictions Weight Bearing Restrictions: No     Mobility  Bed Mobility Overal bed mobility: Needs Assistance Bed Mobility: Supine to Sit     Supine to sit: Min guard;HOB elevated     General bed mobility comments: use of rails and HOB elevated    Transfers Overall transfer level: Needs assistance Equipment used: Rolling walker (2 wheeled) Transfers: Sit to/from Stand Sit to Stand: Min guard         General transfer comment: pt benefits from cues for hand placement, often pushing up from walker with BUE  Ambulation/Gait Ambulation/Gait assistance: Min guard Gait Distance (Feet): 50 Feet (50' x 2) Assistive device: Rolling walker (2 wheeled) Gait  Pattern/deviations: Step-through pattern Gait velocity: reduced Gait velocity interpretation: <1.8 ft/sec, indicate of risk for recurrent falls General Gait Details: pt with slowed step-through gait, increased trunk flexion with fatigue   Stairs             Wheelchair Mobility    Modified Rankin (Stroke Patients Only)       Balance Overall balance assessment: Needs assistance Sitting-balance support: No upper extremity supported;Feet supported Sitting balance-Leahy Scale: Fair     Standing balance support: Bilateral upper extremity supported Standing balance-Leahy Scale: Poor Standing balance comment: reliant on BUE support                            Cognition Arousal/Alertness: Awake/alert Behavior During Therapy: WFL for tasks assessed/performed Overall Cognitive Status: Impaired/Different from baseline Area of Impairment: Memory                     Memory: Decreased short-term memory                Exercises      General Comments General comments (skin integrity, edema, etc.): VSS on RA      Pertinent Vitals/Pain Pain Assessment: No/denies pain    Home Living                      Prior Function            PT Goals (current goals can now be found in the care plan section) Acute Rehab PT Goals Patient Stated Goal: to go home Progress towards PT goals: Progressing toward goals    Frequency  Min 3X/week      PT Plan Current plan remains appropriate    Co-evaluation              AM-PAC PT "6 Clicks" Mobility   Outcome Measure  Help needed turning from your back to your side while in a flat bed without using bedrails?: A Little Help needed moving from lying on your back to sitting on the side of a flat bed without using bedrails?: A Little Help needed moving to and from a bed to a chair (including a wheelchair)?: A Little Help needed standing up from a chair using your arms (e.g., wheelchair or  bedside chair)?: A Little Help needed to walk in hospital room?: A Little Help needed climbing 3-5 steps with a railing? : Total 6 Click Score: 16    End of Session   Activity Tolerance: Patient tolerated treatment well Patient left: in chair;with call bell/phone within reach;with family/visitor present Nurse Communication: Mobility status PT Visit Diagnosis: Muscle weakness (generalized) (M62.81);Other abnormalities of gait and mobility (R26.89);Other symptoms and signs involving the nervous system (R29.898) Pain - Right/Left: Right Pain - part of body: Leg     Time: 4665-9935 PT Time Calculation (min) (ACUTE ONLY): 28 min  Charges:  $Gait Training: 8-22 mins $Therapeutic Activity: 8-22 mins                     Arlyss Gandy, PT, DPT Acute Rehabilitation Pager: [phone]    Arlyss Gandy 01/31/2021, 4:38 PM

## 2021-02-01 DIAGNOSIS — D638 Anemia in other chronic diseases classified elsewhere: Secondary | ICD-10-CM | POA: Diagnosis present

## 2021-02-01 DIAGNOSIS — E7800 Pure hypercholesterolemia, unspecified: Secondary | ICD-10-CM | POA: Diagnosis present

## 2021-02-01 DIAGNOSIS — I890 Lymphedema, not elsewhere classified: Secondary | ICD-10-CM | POA: Diagnosis present

## 2021-02-01 DIAGNOSIS — L03115 Cellulitis of right lower limb: Secondary | ICD-10-CM | POA: Diagnosis present

## 2021-02-01 DIAGNOSIS — E876 Hypokalemia: Secondary | ICD-10-CM | POA: Diagnosis present

## 2021-02-01 DIAGNOSIS — L0390 Cellulitis, unspecified: Secondary | ICD-10-CM | POA: Diagnosis not present

## 2021-02-01 DIAGNOSIS — R7989 Other specified abnormal findings of blood chemistry: Secondary | ICD-10-CM | POA: Diagnosis not present

## 2021-02-01 DIAGNOSIS — N179 Acute kidney failure, unspecified: Secondary | ICD-10-CM | POA: Diagnosis present

## 2021-02-01 DIAGNOSIS — K567 Ileus, unspecified: Secondary | ICD-10-CM | POA: Diagnosis not present

## 2021-02-01 DIAGNOSIS — J45909 Unspecified asthma, uncomplicated: Secondary | ICD-10-CM | POA: Diagnosis not present

## 2021-02-01 DIAGNOSIS — M6281 Muscle weakness (generalized): Secondary | ICD-10-CM | POA: Diagnosis not present

## 2021-02-01 DIAGNOSIS — R110 Nausea: Secondary | ICD-10-CM | POA: Diagnosis not present

## 2021-02-01 DIAGNOSIS — G9340 Encephalopathy, unspecified: Secondary | ICD-10-CM | POA: Diagnosis not present

## 2021-02-01 DIAGNOSIS — G2581 Restless legs syndrome: Secondary | ICD-10-CM | POA: Diagnosis present

## 2021-02-01 DIAGNOSIS — Z9989 Dependence on other enabling machines and devices: Secondary | ICD-10-CM | POA: Diagnosis not present

## 2021-02-01 DIAGNOSIS — I959 Hypotension, unspecified: Secondary | ICD-10-CM | POA: Diagnosis not present

## 2021-02-01 DIAGNOSIS — I5032 Chronic diastolic (congestive) heart failure: Secondary | ICD-10-CM | POA: Diagnosis not present

## 2021-02-01 DIAGNOSIS — Z4659 Encounter for fitting and adjustment of other gastrointestinal appliance and device: Secondary | ICD-10-CM | POA: Diagnosis not present

## 2021-02-01 DIAGNOSIS — M7989 Other specified soft tissue disorders: Secondary | ICD-10-CM | POA: Diagnosis not present

## 2021-02-01 DIAGNOSIS — K219 Gastro-esophageal reflux disease without esophagitis: Secondary | ICD-10-CM | POA: Diagnosis present

## 2021-02-01 DIAGNOSIS — E871 Hypo-osmolality and hyponatremia: Secondary | ICD-10-CM | POA: Diagnosis present

## 2021-02-01 DIAGNOSIS — G40909 Epilepsy, unspecified, not intractable, without status epilepticus: Secondary | ICD-10-CM | POA: Diagnosis present

## 2021-02-01 DIAGNOSIS — E785 Hyperlipidemia, unspecified: Secondary | ICD-10-CM | POA: Diagnosis not present

## 2021-02-01 DIAGNOSIS — R569 Unspecified convulsions: Secondary | ICD-10-CM | POA: Diagnosis not present

## 2021-02-01 DIAGNOSIS — Z90710 Acquired absence of both cervix and uterus: Secondary | ICD-10-CM | POA: Diagnosis not present

## 2021-02-01 DIAGNOSIS — L03119 Cellulitis of unspecified part of limb: Secondary | ICD-10-CM | POA: Diagnosis not present

## 2021-02-01 DIAGNOSIS — Z7982 Long term (current) use of aspirin: Secondary | ICD-10-CM | POA: Diagnosis not present

## 2021-02-01 DIAGNOSIS — Z79899 Other long term (current) drug therapy: Secondary | ICD-10-CM | POA: Diagnosis not present

## 2021-02-01 DIAGNOSIS — G4733 Obstructive sleep apnea (adult) (pediatric): Secondary | ICD-10-CM | POA: Diagnosis present

## 2021-02-01 DIAGNOSIS — Z7401 Bed confinement status: Secondary | ICD-10-CM | POA: Diagnosis not present

## 2021-02-01 DIAGNOSIS — L02619 Cutaneous abscess of unspecified foot: Secondary | ICD-10-CM | POA: Diagnosis not present

## 2021-02-01 DIAGNOSIS — L02611 Cutaneous abscess of right foot: Secondary | ICD-10-CM | POA: Diagnosis present

## 2021-02-01 DIAGNOSIS — I609 Nontraumatic subarachnoid hemorrhage, unspecified: Secondary | ICD-10-CM | POA: Diagnosis not present

## 2021-02-01 DIAGNOSIS — I10 Essential (primary) hypertension: Secondary | ICD-10-CM | POA: Diagnosis present

## 2021-02-01 DIAGNOSIS — Z87891 Personal history of nicotine dependence: Secondary | ICD-10-CM | POA: Diagnosis not present

## 2021-02-01 DIAGNOSIS — Z7951 Long term (current) use of inhaled steroids: Secondary | ICD-10-CM | POA: Diagnosis not present

## 2021-02-01 DIAGNOSIS — K529 Noninfective gastroenteritis and colitis, unspecified: Secondary | ICD-10-CM | POA: Diagnosis present

## 2021-02-01 DIAGNOSIS — Z981 Arthrodesis status: Secondary | ICD-10-CM | POA: Diagnosis not present

## 2021-02-01 DIAGNOSIS — R29898 Other symptoms and signs involving the musculoskeletal system: Secondary | ICD-10-CM | POA: Diagnosis not present

## 2021-02-01 DIAGNOSIS — Z79891 Long term (current) use of opiate analgesic: Secondary | ICD-10-CM | POA: Diagnosis not present

## 2021-02-01 DIAGNOSIS — Z20822 Contact with and (suspected) exposure to covid-19: Secondary | ICD-10-CM | POA: Diagnosis present

## 2021-02-01 DIAGNOSIS — M1990 Unspecified osteoarthritis, unspecified site: Secondary | ICD-10-CM | POA: Diagnosis present

## 2021-02-01 DIAGNOSIS — R600 Localized edema: Secondary | ICD-10-CM | POA: Diagnosis not present

## 2021-02-01 DIAGNOSIS — Z91041 Radiographic dye allergy status: Secondary | ICD-10-CM | POA: Diagnosis not present

## 2021-02-01 MED ORDER — CAMPHOR-MENTHOL 0.5-0.5 % EX LOTN: TOPICAL_LOTION | CUTANEOUS | 0 refills | Status: DC | PRN

## 2021-02-01 MED ORDER — KLOR-CON M20 20 MEQ PO TBCR: 20.0000 meq | EXTENDED_RELEASE_TABLET | Freq: Every day | ORAL | Status: DC

## 2021-02-01 MED ORDER — MAGNESIUM OXIDE -MG SUPPLEMENT 400 (240 MG) MG PO TABS
400.0000 mg | ORAL_TABLET | Freq: Every day | ORAL | Status: DC
  Administered 2021-02-01: 400 mg via ORAL
  Filled 2021-02-01: qty 1

## 2021-02-01 MED ORDER — MAGNESIUM OXIDE 400 MG PO TABS: 800.0000 mg | ORAL_TABLET | Freq: Every day | ORAL | Status: AC

## 2021-02-01 MED ORDER — METOLAZONE 5 MG PO TABS: ORAL_TABLET | ORAL | 1 refills | Status: DC

## 2021-02-01 MED ORDER — THIAMINE HCL 100 MG PO TABS: 100.0000 mg | ORAL_TABLET | Freq: Every day | ORAL | Status: DC

## 2021-02-01 MED ORDER — SPIRONOLACTONE 50 MG PO TABS: 25.0000 mg | ORAL_TABLET | Freq: Every day | ORAL | 0 refills | Status: DC

## 2021-02-01 MED ORDER — DIPHENHYDRAMINE-ZINC ACETATE 2-0.1 % EX CREA: TOPICAL_CREAM | Freq: Two times a day (BID) | CUTANEOUS | 0 refills | Status: DC | PRN

## 2021-02-01 MED ORDER — HYDROXYZINE HCL 25 MG PO TABS: 25.0000 mg | ORAL_TABLET | Freq: Every day | ORAL | 0 refills | Status: AC | PRN

## 2021-02-01 NOTE — Discharge Summary (Signed)
Physician Discharge Summary  [name] VFI:[id number] DOB: Sep 30, 1950 DOA: 01/18/2021  PCP: [name], MD  Admit date: 01/18/2021 Discharge date: 02/01/2021  Admitted From: SNF Discharge disposition: SNF   Recommendations for Outpatient Follow-Up:   Soft diet- monitor for ileus/monitor BMs BMP 2x/weekly for K and Cr- until stable, Mg weekly- adjust meds -will need close adjustments of her diuretics to avoid volume overload as well as dehydration -avoid pain meds/altering meds CPAP QHS Daily weights- adjust diuretics   Discharge Diagnosis:   Principal Problem:   ARF (acute renal failure) (HCC) Active Problems:   Restless legs syndrome (RLS)   Seizure disorder (HCC)   Essential hypertension   Bilateral lower extremity edema   OSA on CPAP   Morbid obesity due to excess calories [facility])    Discharge Condition: Improved.  Diet recommendation: soft  Wound care: monitor injuries on right thighs/buttocks.  Code status: Full.   History of Present Illness:   [name] is a 70 y.o. female with history of chronic lower extremity lymphedema was recently admitted for sepsis secondary to cellulitis of the lower extremity was discharged to rehab was found to have elevated potassium and was referred to the ER.  Patient states she has been having diarrhea for last couple of days.  Denies abdominal pain nausea vomiting denies chest pain or shortness of breath.  Patient is a poor historian.  Of recently has been found to be increasingly confused.     Hospital Course by Problem:   Ileus -CT abdomen pelvis noted dilated loops of small and large intestine,, NG tube was placed to intermittent suction -GS signed off -resolved -needs K of 4 and Mg of 2-- will need frequent labs draws at the SNF   AKI/hyperkalemia on admission -Resolved, treated with hydration, ARB and diuretics were held -d/c arb for now -adjust her diuretics as I suspect she is not drinking as  much liquid at the SNF   Severe hypokalemia -Due to GI losses- will need close monitoring outpatient  -replace Mg as well   Metabolic encephalopathy -Secondary to AKI, decreased gabapentin clearance, improved -hold off on Gabapentin for now as she is not c/o of any issues with neuropathy   History of seizure disorder -Continue phenobarbital   Obesity/OSA -Continue CPAP nightly   Pressure injury  Pressure Injury 01/09/21 Buttocks Right Stage 2 -  Partial thickness loss of dermis presenting as a shallow open injury with a red, pink wound bed without slough. (Active)  01/09/21 1300  Location: Buttocks  Location Orientation: Right  Staging: Stage 2 -  Partial thickness loss of dermis presenting as a shallow open injury with a red, pink wound bed without slough.  Wound Description (Comments):   Present on Admission: Yes     Pressure Injury 01/19/21 Thigh Anterior;Right Deep Tissue Pressure Injury - Purple or maroon localized area of discolored intact skin or blood-filled blister due to damage of underlying soft tissue from pressure and/or shear. Linear DTI (Active)  01/19/21 1936  Location: Thigh  Location Orientation: Anterior;Right  Staging: Deep Tissue Pressure Injury - Purple or maroon localized area of discolored intact skin or blood-filled blister due to damage of underlying soft tissue from pressure and/or shear.  Wound Description (Comments): Linear DTI  Present on Admission: Yes        Chronic diarrhea -Infectious work-up negative, will need gastroenterology follow-up -Now with resolving ileus as above -could be related to the large amount of magnesium she is on--  will reduce   Chronic lower extremity lymphedema Chronic dermatitis  -not requiring ANY pain meds in the hospital    Medical Consultants:   Neurology General surgery   Discharge Exam:   Vitals:   01/31/21 2157 02/01/21 0518  BP: (!) 142/49 (!) 116/51  Pulse: 81 77  Resp: 16 16  Temp: 99 F  (37.2 C) 98.4 F (36.9 C)  SpO2: 99% 97%   Vitals:   01/31/21 0918 01/31/21 1707 01/31/21 2157 02/01/21 0518  BP: (!) 108/43 124/61 (!) 142/49 (!) 116/51  Pulse: 72 86 81 77  Resp: 18 18 16 16   Temp: 98.5 F (36.9 C) 98.4 F (36.9 C) 99 F (37.2 C) 98.4 F (36.9 C)  TempSrc: Oral Oral  Oral  SpO2: 95% 100% 99% 97%  Weight:   122.8 kg   Height:        General exam: Appears calm and comfortable. Pleasant and cooperative- knows she is being d/c'd to SNF   The results of significant diagnostics from this hospitalization (including imaging, microbiology, ancillary and laboratory) are listed below for reference.     Procedures and Diagnostic Studies:   EEG adult  Result Date: 01/22/2021 Charlsie Quest, MD     01/22/2021  2:26 PM Patient Name: [name] MRN: [id number] Epilepsy Attending: Charlsie Quest Referring Physician/Provider: Dr Lynden Oxford Date: 01/22/2021 Duration: 26.35 mins Patient history: 70 year old female with altered mental status.  EEG to evaluate for seizures. Level of alertness: Awake AEDs during EEG study: Phenobarbital Technical aspects: This EEG study was done with scalp electrodes positioned according to the 10-20 International system of electrode placement. Electrical activity was acquired at a sampling rate of 500Hz  and reviewed with a high frequency filter of 70Hz  and a low frequency filter of 1Hz . EEG data were recorded continuously and digitally stored. Description: No clear posterior dominant rhythm was seen.  EEG showed near continuous generalized 5 to 7 Hz theta as well as intermittent generalized 2 to 3 Hz delta slowing.  Hyperventilation and photic stimulation were not performed.   ABNORMALITY - Continuous slow, generalized IMPRESSION: This study is suggestive of moderate diffuse encephalopathy, nonspecific etiology. No seizures or epileptiform discharges were seen throughout the recording. Charlsie Quest     Labs:   Basic Metabolic  Panel: Recent Labs  Lab 01/27/21 0834 01/28/21 0516 01/29/21 0403 01/30/21 0357 01/31/21 0858  NA 134* 133* 134* 135 135  K 3.3* 2.7* 3.1* 2.9* 4.0  CL 101 96* 102 104 107  CO2 23 23 21* 22 19*  GLUCOSE 111* 103* 101* 94 96  BUN 14 16 13 10 8   CREATININE 0.89 0.76 0.75 0.71 0.73  CALCIUM 9.2 9.3 9.1 9.2 9.2  MG  --   --   --  1.4*  --    GFR Estimated Creatinine Clearance: 83 mL/min (by C-G formula based on SCr of 0.73 mg/dL). Liver Function Tests: Recent Labs  Lab 01/27/21 0834  AST 21  ALT 22  ALKPHOS 81  BILITOT 0.6  PROT 7.0  ALBUMIN 2.6*   No results for input(s): LIPASE, AMYLASE in the last 168 hours. No results for input(s): AMMONIA in the last 168 hours. Coagulation profile No results for input(s): INR, PROTIME in the last 168 hours.  CBC: Recent Labs  Lab 01/26/21 1818 01/27/21 0834 01/28/21 0516 01/29/21 0403 01/30/21 0357  WBC 12.8* 14.7* 11.2* 8.9 7.1  HGB 13.2 12.4 11.5* 11.2* 10.6*  HCT 38.0 37.2 33.8* 32.3* 31.7*  MCV 92.2  93.2 92.3 92.3 93.2  PLT 326 299 270 236 254   Cardiac Enzymes: No results for input(s): CKTOTAL, CKMB, CKMBINDEX, TROPONINI in the last 168 hours. BNP: Invalid input(s): POCBNP CBG: No results for input(s): GLUCAP in the last 168 hours. D-Dimer No results for input(s): DDIMER in the last 72 hours. Hgb A1c No results for input(s): HGBA1C in the last 72 hours. Lipid Profile No results for input(s): CHOL, HDL, LDLCALC, TRIG, CHOLHDL, LDLDIRECT in the last 72 hours. Thyroid function studies No results for input(s): TSH, T4TOTAL, T3FREE, THYROIDAB in the last 72 hours.  Invalid input(s): FREET3 Anemia work up No results for input(s): VITAMINB12, FOLATE, FERRITIN, TIBC, IRON, RETICCTPCT in the last 72 hours. Microbiology Recent Results (from the past 240 hour(s))  Resp Panel by RT-PCR (Flu A&B, Covid) Nasopharyngeal Swab     Status: None   Collection Time: 01/31/21  6:47 PM   Specimen: Nasopharyngeal Swab;  Nasopharyngeal(NP) swabs in vial transport medium  Result Value Ref Range Status   SARS Coronavirus 2 by RT PCR NEGATIVE NEGATIVE Final    Comment: (NOTE) SARS-CoV-2 target nucleic acids are NOT DETECTED.  The SARS-CoV-2 RNA is generally detectable in upper respiratory specimens during the acute phase of infection. The lowest concentration of SARS-CoV-2 viral copies this assay can detect is 138 copies/mL. A negative result does not preclude SARS-Cov-2 infection and should not be used as the sole basis for treatment or other patient management decisions. A negative result may occur with  improper specimen collection/handling, submission of specimen other than nasopharyngeal swab, presence of viral mutation(s) within the areas targeted by this assay, and inadequate number of viral copies(<138 copies/mL). A negative result must be combined with clinical observations, patient history, and epidemiological information. The expected result is Negative.  Fact Sheet for Patients:  BloggerCourse.com  Fact Sheet for Healthcare Providers:  SeriousBroker.it  This test is no t yet approved or cleared by the [place] FDA and  has been authorized for detection and/or diagnosis of SARS-CoV-2 by FDA under an Emergency Use Authorization (EUA). This EUA will remain  in effect (meaning this test can be used) for the duration of the COVID-19 declaration under Section 564(b)(1) of the Act, 21 U.S.C.section 360bbb-3(b)(1), unless the authorization is terminated  or revoked sooner.       Influenza A by PCR NEGATIVE NEGATIVE Final   Influenza B by PCR NEGATIVE NEGATIVE Final    Comment: (NOTE) The Xpert Xpress SARS-CoV-2/FLU/RSV plus assay is intended as an aid in the diagnosis of influenza from Nasopharyngeal swab specimens and should not be used as a sole basis for treatment. Nasal washings and aspirates are unacceptable for Xpert Xpress  SARS-CoV-2/FLU/RSV testing.  Fact Sheet for Patients: BloggerCourse.com  Fact Sheet for Healthcare Providers: SeriousBroker.it  This test is not yet approved or cleared by the [place] FDA and has been authorized for detection and/or diagnosis of SARS-CoV-2 by FDA under an Emergency Use Authorization (EUA). This EUA will remain in effect (meaning this test can be used) for the duration of the COVID-19 declaration under Section 564(b)(1) of the Act, 21 U.S.C. section 360bbb-3(b)(1), unless the authorization is terminated or revoked.  Performed at [facility], [address] [place], [place] [address]      Discharge Instructions:   Discharge Instructions     Discharge instructions   Complete by: As directed    Soft diet BMP 2x/weekly for K and Cr- until stable -will need close adjustments of her diuretics to avoid  volume overload as well as dehydration -avoid pain meds/altering meds CPAP QHS Daily weights   Discharge wound care:   Complete by: As directed    Monitor pressure injuries on right buttocks and thigh   Increase activity slowly   Complete by: As directed       Allergies as of 02/01/2021       Reactions   Compazine Shortness Of Breath   Prochlorperazine Maleate Shortness Of Breath   Topamax [topiramate] Hives, Rash   Codeine Sulfate Nausea Only   Seasonal Ic [cholestatin] Hives   Adhesive [tape] Rash   Iodinated Diagnostic Agents Rash   Uncoded Allergy. Allergen: contrast dyes        Medication List     STOP taking these medications    amLODipine 5 MG tablet Commonly known as: NORVASC   doxycycline 40 MG capsule Commonly known as: ORACEA   gabapentin 100 MG capsule Commonly known as: NEURONTIN   gabapentin 300 MG capsule Commonly known as: NEURONTIN   hydrocortisone 1 % ointment   Klor-Con M20 20 MEQ tablet Generic drug: potassium chloride SA   nabumetone 750 MG  tablet Commonly known as: RELAFEN   traMADol 50 MG tablet Commonly known as: ULTRAM   valsartan 80 MG tablet Commonly known as: DIOVAN       TAKE these medications    acetaminophen 325 MG tablet Commonly known as: TYLENOL Take 2 tablets (650 mg total) by mouth every 6 (six) hours as needed for mild pain or headache.   albuterol 108 (90 Base) MCG/ACT inhaler Commonly known as: VENTOLIN HFA Inhale 2 puffs into the lungs every 4 (four) hours as needed for wheezing.   aspirin EC 81 MG tablet Take 1 tablet (81 mg total) by mouth 2 (two) times daily.   Biotin 5000 MCG Caps Take 5,000 mcg by mouth 2 (two) times daily.   CALCIUM 600+D PO Take 1 tablet by mouth daily.   camphor-menthol lotion Commonly known as: SARNA Apply topically as needed for itching.   cholecalciferol 1000 units tablet Commonly known as: VITAMIN D Take 3,000 Units by mouth daily.   diphenhydrAMINE-zinc acetate cream Commonly known as: BENADRYL Apply topically 2 (two) times daily as needed for itching.   ferrous sulfate 325 (65 FE) MG tablet Take 650 mg by mouth daily with breakfast.   fluticasone 50 MCG/ACT nasal spray Commonly known as: FLONASE Place 2 sprays into the nose 2 (two) times daily.   hydrOXYzine 25 MG tablet Commonly known as: ATARAX/VISTARIL Take 1 tablet (25 mg total) by mouth daily as needed for itching or anxiety. What changed:  when to take this reasons to take this   levocetirizine 5 MG tablet Commonly known as: XYZAL Take 5 mg by mouth at bedtime.   magnesium oxide 400 MG tablet Commonly known as: MAG-OX Take 2 tablets (800 mg total) by mouth at bedtime. What changed: how much to take   metolazone 5 MG tablet Commonly known as: ZAROXOLYN 5 mg 3x weekly PRN fluid/excessive volume What changed:  how much to take how to take this when to take this additional instructions   montelukast 10 MG tablet Commonly known as: SINGULAIR Take 10 mg by mouth at bedtime.    pantoprazole 40 MG tablet Commonly known as: PROTONIX Take 40 mg by mouth at bedtime.   PHENobarbital 64.8 MG tablet Commonly known as: LUMINAL Take 2 tablets (129.6 mg total) by mouth at bedtime.   Pramipexole Dihydrochloride 0.75 MG Tb24 Take 1 tablet (0.75 mg  total) by mouth at bedtime. What changed: Another medication with the same name was changed. Make sure you understand how and when to take each.   pramipexole 0.25 MG tablet Commonly known as: MIRAPEX TAKE 1-2 TABS AT 6 PM FOR RESTLESS LEG SYNDROME What changed:  how much to take how to take this when to take this additional instructions   PRESCRIPTION MEDICATION Inhale into the lungs at bedtime. CPAP   rosuvastatin 20 MG tablet Commonly known as: CRESTOR TAKE 1 TABLET (20 MG TOTAL) BY MOUTH DAILY. NEEDS APPOINTMENT FOR FUTURE REFILLS What changed: additional instructions   spironolactone 50 MG tablet Commonly known as: ALDACTONE Take 0.5 tablets (25 mg total) by mouth daily. What changed: how much to take   thiamine 100 MG tablet Take 1 tablet (100 mg total) by mouth daily.   torsemide 20 MG tablet Commonly known as: DEMADEX Take 20 mg by mouth daily.   zinc gluconate 50 MG tablet Take 50 mg by mouth daily.               Discharge Care Instructions  (From admission, onward)           Start     Ordered   02/01/21 0000  Discharge wound care:       Comments: Monitor pressure injuries on right buttocks and thigh   02/01/21 0836              Time coordinating discharge: 35 min  Signed:  Joseph Art DO  [facility] 02/01/2021, 8:38 AM

## 2021-02-01 NOTE — [place] FL2 (Signed)
[place] MEDICAID FL2 LEVEL OF CARE SCREENING TOOL     IDENTIFICATION  Patient Name: [name] Birthdate: 05-24-1950 Sex: female Admission Date (Current Location): 01/18/2021  [facility] and [place] Number:  Producer, television/film/video and Address:  [facility], [address] [place], [place] [address]      Provider Number: [id number]  Attending Physician Name and Address:  Joseph Art, DO  Relative Name and Phone Number:  Zhara Gieske [phone]    Current Level of Care: Hospital Recommended Level of Care: Skilled Nursing Facility Prior Approval Number:    Date Approved/Denied:   PASRR Number: [id number]  Discharge Plan: SNF    Current Diagnoses: Patient Active Problem List   Diagnosis Date Noted   Acute renal failure (ARF) (HCC) 01/19/2021   ARF (acute renal failure) (HCC) 01/18/2021   SIRS (systemic inflammatory response syndrome) (HCC) 01/09/2021   Acute encephalopathy 01/09/2021   Hyponatremia 12/20/2018   Cellulitis 12/20/2018   Cellulitis of left lower extremity 12/19/2018   Hypokalemia 02/17/2017   DOE (dyspnea on exertion) 01/08/2017   Medication management 01/08/2017   Weight gain 01/08/2017   Lymphedema of right lower extremity 10/22/2016   Wound dehiscence, surgical, subsequent encounter 05/12/2016   S/P ankle arthrodesis 03/12/2016   OSA on CPAP 09/10/2015   Nocturia more than twice per night 09/10/2015   Morbid obesity due to excess calories (HCC) 09/10/2015   Loss of consciousness (HCC) 08/15/2015   Fracture of ankle, trimalleolar, closed 07/03/2015   Syncope 07/02/2015   Seizure disorder (HCC) 07/02/2015   Essential hypertension 07/02/2015   Bilateral lower extremity edema 07/02/2015   Closed right ankle fracture 07/02/2015   GERD (gastroesophageal reflux disease) 07/02/2015   HLD (hyperlipidemia) 07/02/2015   Anosmia/chronic 07/02/2015   Leukocytosis 07/02/2015   Restless legs syndrome (RLS) 04/28/2013    Sleep apnea with use of continuous positive airway pressure (CPAP) 01/24/2013    Orientation RESPIRATION BLADDER Height & Weight     Self, Time, Situation, Place  Normal (2L nasal cannula, has home cpap machine) Incontinent, External catheter Weight: 270 lb 11.6 oz (122.8 kg) Height:  5\' 2"  (157.5 cm)  BEHAVIORAL SYMPTOMS/MOOD NEUROLOGICAL BOWEL NUTRITION STATUS      Continent Diet (See DC summary)  AMBULATORY STATUS COMMUNICATION OF NEEDS Skin   Limited Assist Verbally PU Stage and Appropriate Care (PI thigh and buttocks)                       Personal Care Assistance Level of Assistance  Bathing, Feeding, Dressing Bathing Assistance: Limited assistance Feeding assistance: Limited assistance Dressing Assistance: Limited assistance     Functional Limitations Info  Sight, Hearing, Speech Sight Info: Adequate Hearing Info: Adequate Speech Info: Adequate    SPECIAL CARE FACTORS FREQUENCY  PT (By licensed PT), OT (By licensed OT)     PT Frequency: 5x week OT Frequency: 5x week            Contractures Contractures Info: Not present    Additional Factors Info  Code Status, Allergies Code Status Info: Full Allergies Info: Compazine   Prochlorperazine Maleate   Topamax (Topiramate)   Codeine Sulfate   Seasonal Ic (Cholestatin)   Adhesive (Tape)   Iodinated Diagnostic Agents           Current Medications (02/01/2021):  This is the current hospital active medication list Current Facility-Administered Medications  Medication Dose Route Frequency Provider Last Rate Last Admin   acetaminophen (TYLENOL) tablet  650 mg  650 mg Oral Q6H PRN Eduard Clos, MD   650 mg at 01/29/21 1033   Or   acetaminophen (TYLENOL) suppository 650 mg  650 mg Rectal Q6H PRN Eduard Clos, MD       albuterol (PROVENTIL) (2.5 MG/3ML) 0.083% nebulizer solution 3 mL  3 mL Inhalation Q4H PRN Eduard Clos, MD       aspirin EC tablet 81 mg  81 mg Oral BID Eduard Clos,  MD   81 mg at 01/31/21 2159   camphor-menthol (SARNA) lotion   Topical PRN Joseph Art, DO   Given at 01/30/21 1339   diphenhydrAMINE-zinc acetate (BENADRYL) 2-0.1 % cream   Topical BID PRN Joseph Art, DO   Given at 01/30/21 1820   feeding supplement (BOOST / RESOURCE BREEZE) liquid 1 Container  1 Container Oral TID BM Meuth, Brooke A, PA-C   1 Container at 01/31/21 2159   heparin injection 5,000 Units  5,000 Units Subcutaneous Q8H Eduard Clos, MD   5,000 Units at 02/01/21 6761   magnesium oxide (MAG-OX) tablet 400 mg  400 mg Oral Daily Vann, Hara Milholland U, DO       montelukast (SINGULAIR) tablet 10 mg  10 mg Oral QHS Eduard Clos, MD   10 mg at 01/31/21 2159   ondansetron (ZOFRAN) injection 4 mg  4 mg Intravenous Q6H PRN Myrtie Neither, MD   4 mg at 01/26/21 2327   PHENobarbital (LUMINAL) tablet 129.6 mg  129.6 mg Oral QHS Eduard Clos, MD   129.6 mg at 01/31/21 2159   pramipexole (MIRAPEX) tablet 0.5 mg  0.5 mg Oral QPM Rolly Salter, MD   0.5 mg at 01/31/21 1759   rosuvastatin (CRESTOR) tablet 20 mg  20 mg Oral Daily Eduard Clos, MD   20 mg at 01/31/21 1048   thiamine tablet 100 mg  100 mg Oral Daily Myrtie Neither, MD   100 mg at 01/31/21 1048     Discharge Medications: Please see discharge summary for a list of discharge medications.  Relevant Imaging Results:  Relevant Lab Results:   Additional Information SS#: [id number]. [employer] 07/19/19, 08/09/19  Carley Hammed, LCSWA

## 2021-02-01 NOTE — Plan of Care (Signed)
  Problem: Education: Goal: Knowledge of General Education information will improve Description: Including pain rating scale, medication(s)/side effects and non-pharmacologic comfort measures Outcome: Completed/Met   Problem: Health Behavior/Discharge Planning: Goal: Ability to manage health-related needs will improve Outcome: Completed/Met   Problem: Clinical Measurements: Goal: Ability to maintain clinical measurements within normal limits will improve Outcome: Completed/Met Goal: Will remain free from infection Outcome: Completed/Met Goal: Diagnostic test results will improve Outcome: Completed/Met Goal: Respiratory complications will improve Outcome: Completed/Met Goal: Cardiovascular complication will be avoided Outcome: Completed/Met   Problem: Activity: Goal: Risk for activity intolerance will decrease Outcome: Completed/Met   Problem: Nutrition: Goal: Adequate nutrition will be maintained Outcome: Completed/Met   Problem: Coping: Goal: Level of anxiety will decrease Outcome: Completed/Met   Problem: Elimination: Goal: Will not experience complications related to bowel motility Outcome: Completed/Met Goal: Will not experience complications related to urinary retention Outcome: Completed/Met   Problem: Pain Managment: Goal: General experience of comfort will improve Outcome: Completed/Met   Problem: Safety: Goal: Ability to remain free from injury will improve Outcome: Completed/Met   Problem: Skin Integrity: Goal: Risk for impaired skin integrity will decrease Outcome: Completed/Met   Problem: Education: Goal: Knowledge of disease and its progression will improve Outcome: Completed/Met   Problem: Health Behavior/Discharge Planning: Goal: Ability to manage health-related needs will improve Outcome: Completed/Met   Problem: Clinical Measurements: Goal: Complications related to the disease process or treatment will be avoided or minimized Outcome:  Completed/Met Goal: Dialysis access will remain free of complications Outcome: Completed/Met   Problem: Activity: Goal: Activity intolerance will improve Outcome: Completed/Met   Problem: Fluid Volume: Goal: Fluid volume balance will be maintained or improved Outcome: Completed/Met   Problem: Nutritional: Goal: Ability to make appropriate dietary choices will improve Outcome: Completed/Met   Problem: Respiratory: Goal: Respiratory symptoms related to disease process will be avoided Outcome: Completed/Met   Problem: Self-Concept: Goal: Body image disturbance will be avoided or minimized Outcome: Completed/Met   Problem: Urinary Elimination: Goal: Progression of disease will be identified and treated Outcome: Completed/Met

## 2021-02-01 NOTE — Plan of Care (Signed)

## 2021-02-01 NOTE — TOC Transition Note (Signed)
Transition of Care [facility]) - CM/SW Discharge Note   Patient Details  Name: [name] MRN: [id number] Date of Birth: 10/15/1950  [facility] CM/SW Contact:  Carley Hammed, LCSWA Phone Number: 02/01/2021, 10:14 AM   Clinical Narrative:    Pt to be transported to [facility] via [facility]. Nurse to call report to [phone]. Please contact spouse when pt has been picked up.   Final next level of care: Skilled Nursing Facility Barriers to Discharge: Barriers Resolved   Patient Goals and CMS Choice Patient states their goals for this hospitalization and ongoing recovery are:: To go home CMS Medicare.gov Compare Post Acute Care list provided to:: Patient Choice offered to / list presented to : Patient, Spouse  Discharge Placement              Patient [facility] bed at: [facility] Patient to be transferred to facility by: [facility] Name of family member notified: Spouse Patient and family notified of of transfer: 02/01/21  Discharge Plan and Services In-house Referral: Clinical Social Work Discharge Planning Services: CM Consult                                 Social Determinants of Health (SDOH) Interventions     Readmission Risk Interventions No flowsheet data found.

## 2021-02-01 NOTE — Progress Notes (Signed)
DISCHARGE NOTE HOME TAKARA SERMONS to be discharged Skilled nursing facility per MD order. Discussed prescriptions and follow up appointments with the patient. Prescriptions given to patient; medication list explained in detail. Patient verbalized understanding.  Skin clean, dry and intact without evidence of skin break down, no evidence of skin tears noted. IV catheter discontinued intact. Site without signs and symptoms of complications. Dressing and pressure applied. Pt denies pain at the site currently. No complaints noted.  Patient free of lines, drains, and wounds.   An After Visit Summary (AVS) was printed and given to the patient. Patient escorted via stretcher, and discharged to [facility] via [place].  Myrtis Hopping, RN

## 2021-02-03 DIAGNOSIS — J45909 Unspecified asthma, uncomplicated: Secondary | ICD-10-CM | POA: Diagnosis not present

## 2021-02-03 DIAGNOSIS — K219 Gastro-esophageal reflux disease without esophagitis: Secondary | ICD-10-CM | POA: Diagnosis not present

## 2021-02-03 DIAGNOSIS — K567 Ileus, unspecified: Secondary | ICD-10-CM | POA: Diagnosis not present

## 2021-02-03 DIAGNOSIS — L0390 Cellulitis, unspecified: Secondary | ICD-10-CM | POA: Diagnosis not present

## 2021-02-03 DIAGNOSIS — I609 Nontraumatic subarachnoid hemorrhage, unspecified: Secondary | ICD-10-CM | POA: Diagnosis not present

## 2021-02-03 DIAGNOSIS — I890 Lymphedema, not elsewhere classified: Secondary | ICD-10-CM | POA: Diagnosis not present

## 2021-02-03 DIAGNOSIS — I5032 Chronic diastolic (congestive) heart failure: Secondary | ICD-10-CM | POA: Diagnosis not present

## 2021-02-03 DIAGNOSIS — K529 Noninfective gastroenteritis and colitis, unspecified: Secondary | ICD-10-CM | POA: Diagnosis not present

## 2021-02-03 DIAGNOSIS — I10 Essential (primary) hypertension: Secondary | ICD-10-CM | POA: Diagnosis not present

## 2021-02-04 DIAGNOSIS — I5032 Chronic diastolic (congestive) heart failure: Secondary | ICD-10-CM | POA: Diagnosis not present

## 2021-02-04 DIAGNOSIS — I890 Lymphedema, not elsewhere classified: Secondary | ICD-10-CM | POA: Diagnosis not present

## 2021-02-04 DIAGNOSIS — L0390 Cellulitis, unspecified: Secondary | ICD-10-CM | POA: Diagnosis not present

## 2021-02-04 DIAGNOSIS — I10 Essential (primary) hypertension: Secondary | ICD-10-CM | POA: Diagnosis not present

## 2021-02-06 DIAGNOSIS — I10 Essential (primary) hypertension: Secondary | ICD-10-CM | POA: Diagnosis not present

## 2021-02-06 DIAGNOSIS — I5032 Chronic diastolic (congestive) heart failure: Secondary | ICD-10-CM | POA: Diagnosis not present

## 2021-02-06 DIAGNOSIS — L0390 Cellulitis, unspecified: Secondary | ICD-10-CM | POA: Diagnosis not present

## 2021-02-06 DIAGNOSIS — R110 Nausea: Secondary | ICD-10-CM | POA: Diagnosis not present

## 2021-02-06 DIAGNOSIS — I890 Lymphedema, not elsewhere classified: Secondary | ICD-10-CM | POA: Diagnosis not present

## 2021-02-07 ENCOUNTER — Emergency Department (HOSPITAL_BASED_OUTPATIENT_CLINIC_OR_DEPARTMENT_OTHER): Payer: Medicare PPO

## 2021-02-07 ENCOUNTER — Encounter (HOSPITAL_BASED_OUTPATIENT_CLINIC_OR_DEPARTMENT_OTHER): Payer: Self-pay

## 2021-02-07 ENCOUNTER — Other Ambulatory Visit: Payer: Self-pay | Admitting: Physical Medicine and Rehabilitation

## 2021-02-07 ENCOUNTER — Other Ambulatory Visit: Payer: Self-pay

## 2021-02-07 ENCOUNTER — Inpatient Hospital Stay (HOSPITAL_BASED_OUTPATIENT_CLINIC_OR_DEPARTMENT_OTHER)
Admission: EM | Admit: 2021-02-07 | Discharge: 2021-02-12 | DRG: 603 | Disposition: A | Discharge status: Skilled Nursing Facility | Payer: Medicare PPO | Attending: Internal Medicine | Admitting: Internal Medicine

## 2021-02-07 DIAGNOSIS — I890 Lymphedema, not elsewhere classified: Secondary | ICD-10-CM | POA: Diagnosis present

## 2021-02-07 DIAGNOSIS — R110 Nausea: Secondary | ICD-10-CM | POA: Diagnosis not present

## 2021-02-07 DIAGNOSIS — L0390 Cellulitis, unspecified: Secondary | ICD-10-CM | POA: Diagnosis not present

## 2021-02-07 DIAGNOSIS — E876 Hypokalemia: Secondary | ICD-10-CM | POA: Diagnosis present

## 2021-02-07 DIAGNOSIS — Z91048 Other nonmedicinal substance allergy status: Secondary | ICD-10-CM

## 2021-02-07 DIAGNOSIS — Z9989 Dependence on other enabling machines and devices: Secondary | ICD-10-CM | POA: Diagnosis not present

## 2021-02-07 DIAGNOSIS — Z888 Allergy status to other drugs, medicaments and biological substances status: Secondary | ICD-10-CM

## 2021-02-07 DIAGNOSIS — I10 Essential (primary) hypertension: Secondary | ICD-10-CM | POA: Diagnosis present

## 2021-02-07 DIAGNOSIS — Z7951 Long term (current) use of inhaled steroids: Secondary | ICD-10-CM | POA: Diagnosis not present

## 2021-02-07 DIAGNOSIS — G40909 Epilepsy, unspecified, not intractable, without status epilepticus: Secondary | ICD-10-CM | POA: Diagnosis present

## 2021-02-07 DIAGNOSIS — E785 Hyperlipidemia, unspecified: Secondary | ICD-10-CM | POA: Diagnosis not present

## 2021-02-07 DIAGNOSIS — R531 Weakness: Secondary | ICD-10-CM | POA: Diagnosis not present

## 2021-02-07 DIAGNOSIS — L02619 Cutaneous abscess of unspecified foot: Secondary | ICD-10-CM | POA: Diagnosis not present

## 2021-02-07 DIAGNOSIS — Z79899 Other long term (current) drug therapy: Secondary | ICD-10-CM

## 2021-02-07 DIAGNOSIS — Z79891 Long term (current) use of opiate analgesic: Secondary | ICD-10-CM | POA: Diagnosis not present

## 2021-02-07 DIAGNOSIS — R1110 Vomiting, unspecified: Secondary | ICD-10-CM | POA: Diagnosis not present

## 2021-02-07 DIAGNOSIS — K529 Noninfective gastroenteritis and colitis, unspecified: Secondary | ICD-10-CM | POA: Diagnosis present

## 2021-02-07 DIAGNOSIS — G2581 Restless legs syndrome: Secondary | ICD-10-CM | POA: Diagnosis not present

## 2021-02-07 DIAGNOSIS — L03119 Cellulitis of unspecified part of limb: Secondary | ICD-10-CM | POA: Diagnosis not present

## 2021-02-07 DIAGNOSIS — N179 Acute kidney failure, unspecified: Secondary | ICD-10-CM | POA: Diagnosis present

## 2021-02-07 DIAGNOSIS — M25551 Pain in right hip: Secondary | ICD-10-CM

## 2021-02-07 DIAGNOSIS — M1990 Unspecified osteoarthritis, unspecified site: Secondary | ICD-10-CM | POA: Diagnosis present

## 2021-02-07 DIAGNOSIS — M7989 Other specified soft tissue disorders: Secondary | ICD-10-CM | POA: Diagnosis not present

## 2021-02-07 DIAGNOSIS — R569 Unspecified convulsions: Secondary | ICD-10-CM | POA: Diagnosis not present

## 2021-02-07 DIAGNOSIS — R7989 Other specified abnormal findings of blood chemistry: Secondary | ICD-10-CM | POA: Diagnosis not present

## 2021-02-07 DIAGNOSIS — G4733 Obstructive sleep apnea (adult) (pediatric): Secondary | ICD-10-CM | POA: Diagnosis present

## 2021-02-07 DIAGNOSIS — L03115 Cellulitis of right lower limb: Principal | ICD-10-CM | POA: Diagnosis present

## 2021-02-07 DIAGNOSIS — Z90710 Acquired absence of both cervix and uterus: Secondary | ICD-10-CM

## 2021-02-07 DIAGNOSIS — Z87891 Personal history of nicotine dependence: Secondary | ICD-10-CM

## 2021-02-07 DIAGNOSIS — Z20822 Contact with and (suspected) exposure to covid-19: Secondary | ICD-10-CM | POA: Diagnosis not present

## 2021-02-07 DIAGNOSIS — L02611 Cutaneous abscess of right foot: Secondary | ICD-10-CM | POA: Diagnosis present

## 2021-02-07 DIAGNOSIS — R112 Nausea with vomiting, unspecified: Secondary | ICD-10-CM

## 2021-02-07 DIAGNOSIS — K219 Gastro-esophageal reflux disease without esophagitis: Secondary | ICD-10-CM | POA: Diagnosis present

## 2021-02-07 DIAGNOSIS — Z981 Arthrodesis status: Secondary | ICD-10-CM | POA: Diagnosis not present

## 2021-02-07 DIAGNOSIS — Z91041 Radiographic dye allergy status: Secondary | ICD-10-CM

## 2021-02-07 DIAGNOSIS — R600 Localized edema: Secondary | ICD-10-CM | POA: Diagnosis not present

## 2021-02-07 DIAGNOSIS — D638 Anemia in other chronic diseases classified elsewhere: Secondary | ICD-10-CM | POA: Diagnosis present

## 2021-02-07 DIAGNOSIS — Z885 Allergy status to narcotic agent status: Secondary | ICD-10-CM

## 2021-02-07 DIAGNOSIS — Z7401 Bed confinement status: Secondary | ICD-10-CM | POA: Diagnosis not present

## 2021-02-07 DIAGNOSIS — Z7982 Long term (current) use of aspirin: Secondary | ICD-10-CM

## 2021-02-07 DIAGNOSIS — M6281 Muscle weakness (generalized): Secondary | ICD-10-CM | POA: Diagnosis not present

## 2021-02-07 DIAGNOSIS — E7800 Pure hypercholesterolemia, unspecified: Secondary | ICD-10-CM | POA: Diagnosis present

## 2021-02-07 DIAGNOSIS — E871 Hypo-osmolality and hyponatremia: Secondary | ICD-10-CM | POA: Diagnosis not present

## 2021-02-07 LAB — CBC WITH DIFFERENTIAL/PLATELET
Abs Immature Granulocytes: 0.05 10*3/uL (ref 0.00–0.07)
Basophils Absolute: 0 10*3/uL (ref 0.0–0.1)
Basophils Relative: 0 %
Eosinophils Absolute: 0.1 10*3/uL (ref 0.0–0.5)
Eosinophils Relative: 1 %
HCT: 33.2 % — ABNORMAL LOW (ref 36.0–46.0)
Hemoglobin: 11.5 g/dL — ABNORMAL LOW (ref 12.0–15.0)
Immature Granulocytes: 1 %
Lymphocytes Relative: 18 %
Lymphs Abs: 1.9 10*3/uL (ref 0.7–4.0)
MCH: 31 pg (ref 26.0–34.0)
MCHC: 34.6 g/dL (ref 30.0–36.0)
MCV: 89.5 fL (ref 80.0–100.0)
Monocytes Absolute: 0.9 10*3/uL (ref 0.1–1.0)
Monocytes Relative: 8 %
Neutro Abs: 7.6 10*3/uL (ref 1.7–7.7)
Neutrophils Relative %: 72 %
Platelets: 335 10*3/uL (ref 150–400)
RBC: 3.71 MIL/uL — ABNORMAL LOW (ref 3.87–5.11)
RDW: 13.6 % (ref 11.5–15.5)
WBC: 10.5 10*3/uL (ref 4.0–10.5)
nRBC: 0 % (ref 0.0–0.2)

## 2021-02-07 LAB — COMPREHENSIVE METABOLIC PANEL
ALT: 44 U/L (ref 0–44)
AST: 39 U/L (ref 15–41)
Albumin: 3.8 g/dL (ref 3.5–5.0)
Alkaline Phosphatase: 93 U/L (ref 38–126)
Anion gap: 13 (ref 5–15)
BUN: 19 mg/dL (ref 8–23)
CO2: 30 mmol/L (ref 22–32)
Calcium: 9.6 mg/dL (ref 8.9–10.3)
Chloride: 92 mmol/L — ABNORMAL LOW (ref 98–111)
Creatinine, Ser: 1.31 mg/dL — ABNORMAL HIGH (ref 0.44–1.00)
GFR, Estimated: 44 mL/min — ABNORMAL LOW (ref >60)
Glucose, Bld: 111 mg/dL — ABNORMAL HIGH (ref 70–99)
Potassium: 2.7 mmol/L — CL (ref 3.5–5.1)
Sodium: 135 mmol/L (ref 135–145)
Total Bilirubin: 0.4 mg/dL (ref 0.3–1.2)
Total Protein: 7.6 g/dL (ref 6.5–8.1)

## 2021-02-07 LAB — RESP PANEL BY RT-PCR (FLU A&B, COVID) ARPGX2
Influenza A by PCR: NEGATIVE
Influenza B by PCR: NEGATIVE
SARS Coronavirus 2 by RT PCR: NEGATIVE

## 2021-02-07 LAB — CK: Total CK: 146 U/L (ref 38–234)

## 2021-02-07 LAB — LACTIC ACID, PLASMA: Lactic Acid, Venous: 1.1 mmol/L (ref 0.5–1.9)

## 2021-02-07 MED ORDER — PRAMIPEXOLE DIHYDROCHLORIDE 0.25 MG PO TABS
0.7500 mg | ORAL_TABLET | Freq: Every day | ORAL | Status: DC
  Administered 2021-02-08 – 2021-02-11 (×5): 0.75 mg via ORAL
  Filled 2021-02-07 (×6): qty 3

## 2021-02-07 MED ORDER — SODIUM CHLORIDE 0.9 % IV SOLN: Freq: Once | INTRAVENOUS | Status: AC

## 2021-02-07 MED ORDER — ASPIRIN EC 81 MG PO TBEC
81.0000 mg | DELAYED_RELEASE_TABLET | Freq: Two times a day (BID) | ORAL | Status: DC
  Administered 2021-02-08 – 2021-02-12 (×10): 81 mg via ORAL
  Filled 2021-02-07 (×10): qty 1

## 2021-02-07 MED ORDER — ACETAMINOPHEN 650 MG RE SUPP: 650.0000 mg | Freq: Four times a day (QID) | RECTAL | Status: DC | PRN

## 2021-02-07 MED ORDER — THIAMINE HCL 100 MG PO TABS
100.0000 mg | ORAL_TABLET | Freq: Every day | ORAL | Status: DC
  Administered 2021-02-08 – 2021-02-12 (×5): 100 mg via ORAL
  Filled 2021-02-07 (×5): qty 1

## 2021-02-07 MED ORDER — ROSUVASTATIN CALCIUM 20 MG PO TABS
20.0000 mg | ORAL_TABLET | Freq: Every day | ORAL | Status: DC
  Administered 2021-02-08 – 2021-02-12 (×5): 20 mg via ORAL
  Filled 2021-02-07 (×5): qty 1

## 2021-02-07 MED ORDER — PHENOBARBITAL 32.4 MG PO TABS
129.6000 mg | ORAL_TABLET | Freq: Every day | ORAL | Status: DC
  Administered 2021-02-08 – 2021-02-11 (×5): 129.6 mg via ORAL
  Filled 2021-02-07 (×6): qty 4

## 2021-02-07 MED ORDER — ACETAMINOPHEN 325 MG PO TABS
650.0000 mg | ORAL_TABLET | Freq: Four times a day (QID) | ORAL | Status: DC | PRN
  Administered 2021-02-08 – 2021-02-12 (×5): 650 mg via ORAL
  Filled 2021-02-07 (×5): qty 2

## 2021-02-07 MED ORDER — HYDROCORTISONE 1 % EX CREA
1.0000 "application " | TOPICAL_CREAM | Freq: Three times a day (TID) | CUTANEOUS | Status: DC
  Administered 2021-02-08 – 2021-02-12 (×15): 1 via TOPICAL
  Filled 2021-02-07 (×3): qty 28

## 2021-02-07 MED ORDER — POTASSIUM CHLORIDE 10 MEQ/100ML IV SOLN
10.0000 meq | INTRAVENOUS | Status: AC
  Administered 2021-02-07 (×2): 10 meq via INTRAVENOUS
  Filled 2021-02-07 (×2): qty 100

## 2021-02-07 MED ORDER — POTASSIUM CHLORIDE CRYS ER 20 MEQ PO TBCR
40.0000 meq | EXTENDED_RELEASE_TABLET | Freq: Once | ORAL | Status: AC
  Administered 2021-02-07: 40 meq via ORAL
  Filled 2021-02-07: qty 2

## 2021-02-07 MED ORDER — ZINC GLUCONATE 50 MG PO TABS: 50.0000 mg | ORAL_TABLET | Freq: Every day | ORAL | Status: DC

## 2021-02-07 MED ORDER — PANTOPRAZOLE SODIUM 40 MG PO TBEC
40.0000 mg | DELAYED_RELEASE_TABLET | Freq: Every day | ORAL | Status: DC
  Administered 2021-02-08 – 2021-02-11 (×5): 40 mg via ORAL
  Filled 2021-02-07 (×5): qty 1

## 2021-02-07 MED ORDER — PRAMIPEXOLE DIHYDROCHLORIDE 0.25 MG PO TABS
0.2500 mg | ORAL_TABLET | Freq: Every evening | ORAL | Status: DC
  Administered 2021-02-08 – 2021-02-11 (×4): 0.25 mg via ORAL
  Filled 2021-02-07 (×5): qty 1

## 2021-02-07 MED ORDER — SODIUM CHLORIDE 0.9 % IV SOLN
2.0000 g | INTRAVENOUS | Status: DC
  Administered 2021-02-08 – 2021-02-09 (×2): 2 g via INTRAVENOUS
  Filled 2021-02-07 (×2): qty 20

## 2021-02-07 MED ORDER — ALBUTEROL SULFATE (2.5 MG/3ML) 0.083% IN NEBU
2.5000 mg | INHALATION_SOLUTION | RESPIRATORY_TRACT | Status: DC | PRN
  Administered 2021-02-11: 2.5 mg via RESPIRATORY_TRACT
  Filled 2021-02-07: qty 3

## 2021-02-07 MED ORDER — MAGNESIUM OXIDE -MG SUPPLEMENT 400 (240 MG) MG PO TABS
800.0000 mg | ORAL_TABLET | Freq: Every day | ORAL | Status: DC
  Administered 2021-02-08 – 2021-02-11 (×5): 800 mg via ORAL
  Filled 2021-02-07 (×5): qty 2

## 2021-02-07 MED ORDER — SODIUM CHLORIDE 0.9 % IV SOLN
2.0000 g | Freq: Once | INTRAVENOUS | Status: AC
  Administered 2021-02-07: 2 g via INTRAVENOUS
  Filled 2021-02-07: qty 20

## 2021-02-07 MED ORDER — SODIUM CHLORIDE 0.9 % IV SOLN: INTRAVENOUS | Status: AC

## 2021-02-07 MED ORDER — ENOXAPARIN SODIUM 60 MG/0.6ML IJ SOSY
55.0000 mg | PREFILLED_SYRINGE | Freq: Every day | INTRAMUSCULAR | Status: DC
  Administered 2021-02-08 – 2021-02-11 (×5): 55 mg via SUBCUTANEOUS
  Filled 2021-02-07 (×5): qty 0.6

## 2021-02-07 MED ORDER — FERROUS SULFATE 325 (65 FE) MG PO TABS
650.0000 mg | ORAL_TABLET | Freq: Every day | ORAL | Status: DC
  Administered 2021-02-08 – 2021-02-12 (×5): 650 mg via ORAL
  Filled 2021-02-07 (×6): qty 2

## 2021-02-07 NOTE — ED Triage Notes (Signed)
Pt arrives via [facility] with right lower swelling.

## 2021-02-07 NOTE — ED Notes (Signed)
Purewick applied.
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
## 2021-02-07 NOTE — ED Notes (Signed)
Called [facility] to transport patient to [facility] 2[facility] room 1
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
## 2021-02-08 ENCOUNTER — Inpatient Hospital Stay (HOSPITAL_COMMUNITY): Payer: Medicare PPO

## 2021-02-08 DIAGNOSIS — G4733 Obstructive sleep apnea (adult) (pediatric): Secondary | ICD-10-CM

## 2021-02-08 DIAGNOSIS — G40909 Epilepsy, unspecified, not intractable, without status epilepticus: Secondary | ICD-10-CM

## 2021-02-08 DIAGNOSIS — Z9989 Dependence on other enabling machines and devices: Secondary | ICD-10-CM

## 2021-02-08 LAB — MAGNESIUM: Magnesium: 1.3 mg/dL — ABNORMAL LOW (ref 1.7–2.4)

## 2021-02-08 LAB — BASIC METABOLIC PANEL
Anion gap: 11 (ref 5–15)
BUN: 17 mg/dL (ref 8–23)
CO2: 29 mmol/L (ref 22–32)
Calcium: 8.8 mg/dL — ABNORMAL LOW (ref 8.9–10.3)
Chloride: 92 mmol/L — ABNORMAL LOW (ref 98–111)
Creatinine, Ser: 1.39 mg/dL — ABNORMAL HIGH (ref 0.44–1.00)
GFR, Estimated: 41 mL/min — ABNORMAL LOW (ref >60)
Glucose, Bld: 108 mg/dL — ABNORMAL HIGH (ref 70–99)
Potassium: 3.1 mmol/L — ABNORMAL LOW (ref 3.5–5.1)
Sodium: 132 mmol/L — ABNORMAL LOW (ref 135–145)

## 2021-02-08 LAB — CBC
HCT: 32 % — ABNORMAL LOW (ref 36.0–46.0)
Hemoglobin: 11.1 g/dL — ABNORMAL LOW (ref 12.0–15.0)
MCH: 31.5 pg (ref 26.0–34.0)
MCHC: 34.7 g/dL (ref 30.0–36.0)
MCV: 90.9 fL (ref 80.0–100.0)
Platelets: 344 10*3/uL (ref 150–400)
RBC: 3.52 MIL/uL — ABNORMAL LOW (ref 3.87–5.11)
RDW: 13.8 % (ref 11.5–15.5)
WBC: 9.8 10*3/uL (ref 4.0–10.5)
nRBC: 0 % (ref 0.0–0.2)

## 2021-02-08 LAB — C DIFFICILE QUICK SCREEN W PCR REFLEX
C Diff antigen: NEGATIVE
C Diff interpretation: NOT DETECTED
C Diff toxin: NEGATIVE

## 2021-02-08 MED ORDER — GUAIFENESIN-DM 100-10 MG/5ML PO SYRP
5.0000 mL | ORAL_SOLUTION | ORAL | Status: DC | PRN
  Administered 2021-02-08 – 2021-02-12 (×3): 5 mL via ORAL
  Filled 2021-02-08 (×3): qty 5

## 2021-02-08 MED ORDER — HYDROXYZINE HCL 25 MG PO TABS
50.0000 mg | ORAL_TABLET | Freq: Once | ORAL | Status: AC
Start: 2021-02-08 — End: 2021-02-08
  Administered 2021-02-08: 50 mg via ORAL
  Filled 2021-02-08: qty 2

## 2021-02-08 MED ORDER — POTASSIUM CHLORIDE CRYS ER 20 MEQ PO TBCR
40.0000 meq | EXTENDED_RELEASE_TABLET | Freq: Four times a day (QID) | ORAL | Status: AC
Start: 2021-02-08 — End: 2021-02-08
  Administered 2021-02-08 (×2): 40 meq via ORAL
  Filled 2021-02-08 (×3): qty 2

## 2021-02-08 NOTE — Progress Notes (Signed)
[facility] night shift telemetry coverage note.  The nursing staff communicated to [place] that the patient requested something for anxiety.  Her weight is 117 kg.  Hydroxyzine 50 mg IVP x1 dose ordered.  Further treatment for anxiety will be addressed by the primary team later today.  Sanda Klein, MD.

## 2021-02-08 NOTE — Progress Notes (Signed)
Pt has home CPAP unit and places on herself without assistance. RT will monitor as needed.

## 2021-02-08 NOTE — Plan of Care (Signed)
  Problem: Education: Goal: Knowledge of General Education information will improve Description: Including pain rating scale, medication(s)/side effects and non-pharmacologic comfort measures 02/08/2021 2051 by Thea Gist, RN Outcome: Progressing 02/08/2021 2051 by Thea Gist, RN Outcome: Progressing   Problem: Health Behavior/Discharge Planning: Goal: Ability to manage health-related needs will improve 02/08/2021 2051 by Thea Gist, RN Outcome: Progressing 02/08/2021 2051 by Thea Gist, RN Outcome: Progressing   Problem: Clinical Measurements: Goal: Ability to maintain clinical measurements within normal limits will improve Outcome: Progressing Goal: Will remain free from infection Outcome: Progressing Goal: Diagnostic test results will improve Outcome: Progressing Goal: Respiratory complications will improve 02/08/2021 2051 by Thea Gist, RN Outcome: Progressing 02/08/2021 2051 by Thea Gist, RN Outcome: Progressing Goal: Cardiovascular complication will be avoided 02/08/2021 2051 by Thea Gist, RN Outcome: Progressing 02/08/2021 2051 by Thea Gist, RN Outcome: Progressing   Problem: Activity: Goal: Risk for activity intolerance will decrease Outcome: Progressing   Problem: Nutrition: Goal: Adequate nutrition will be maintained 02/08/2021 2051 by Lilianne Delair, Gilford Raid, RN Outcome: Progressing 02/08/2021 2051 by Thea Gist, RN Outcome: Progressing   Problem: Coping: Goal: Level of anxiety will decrease Outcome: Progressing   Problem: Elimination: Goal: Will not experience complications related to bowel motility Outcome: Progressing Goal: Will not experience complications related to urinary retention Outcome: Progressing   Problem: Safety: Goal: Ability to remain free from injury will improve Outcome: Progressing   Problem: Skin Integrity: Goal: Risk for impaired skin integrity will  decrease Outcome: Progressing   Problem: Clinical Measurements: Goal: Ability to avoid or minimize complications of infection will improve Outcome: Progressing

## 2021-02-08 NOTE — Evaluation (Signed)
Physical Therapy Evaluation Patient Details Name: [name] MRN: [id number] DOB: 08-Oct-1950 Today's Date: 02/08/2021  History of Present Illness  The pt is a 70 yo female presenting 9/30 from SNF due to c/o RLE swelling. Pt with recent admission for BLE swelling, was d/c on 9/24. PMH includes: R ICA stent placement, HTN, high cholesterol, seizures, OSA, and R ankle fusion.   Clinical Impression  Pt in bed upon arrival of PT, agreeable to evaluation at this time. Prior to admission the pt was mobilizing with RW or WC at SNF facility where she was receiving rehab after last d/c. The pt reports she had been making good progress and was walking 50-100 ft with RW at rehab.  The pt now presents with limitations in functional mobility, strength, power, dynamic stability, and activity tolerance due to above dx, and will continue to benefit from skilled PT to address these deficits. The pt required increased assist to complete bed mobility and sit-stand transfers at this time, and was limited to small lateral steps to pivot to the chair this session. The pt will continue to benefit from skilled PT acutely to progress functional strength and mobility, and will benefit from return to SNF when medically stable to resume rehab prior to return home.        Recommendations for follow up therapy are one component of a multi-disciplinary discharge planning process, led by the attending physician.  Recommendations may be updated based on patient status, additional functional criteria and insurance authorization.  Follow Up Recommendations SNF;Supervision/Assistance - 24 hour    Equipment Recommendations  None recommended by PT (defer to post acute)    Recommendations for Other Services       Precautions / Restrictions Precautions Precautions: Fall Precaution Comments: pain in RLE Restrictions Weight Bearing Restrictions: No      Mobility  Bed Mobility Overal bed mobility: Needs Assistance Bed  Mobility: Supine to Sit     Supine to sit: Min assist;HOB elevated     General bed mobility comments: minA to RLE to complete movement to EOB and minA to pull trunk up from EOB. increased time and effort    Transfers Overall transfer level: Needs assistance Equipment used: 1 person hand held assist Transfers: Sit to/from [employer] Sit to Stand: Mod assist Stand pivot transfers: Mod assist       General transfer comment: modA to power up with increased use of momentum, modA to steady for pivoting steps to recliner, pt dependent on BUE support, decreased wt shift to RLE  Ambulation/Gait Ambulation/Gait assistance: Mod assist Gait Distance (Feet): 3 Feet Assistive device: 1 person hand held assist Gait Pattern/deviations: Step-to pattern;Decreased weight shift to right;Decreased step length - left Gait velocity: reduced Gait velocity interpretation: <1.31 ft/sec, indicative of household ambulator General Gait Details: pt with small step with LLE, needing significant assist to steady witih stepping due to decreased wt shift to R     Balance Overall balance assessment: Needs assistance Sitting-balance support: No upper extremity supported;Feet supported Sitting balance-Leahy Scale: Fair Sitting balance - Comments: unable to reach outside BOS   Standing balance support: Bilateral upper extremity supported Standing balance-Leahy Scale: Poor Standing balance comment: reliant on BUE support                             Pertinent Vitals/Pain Pain Assessment: Faces Faces Pain Scale: Hurts little more Pain Location: RLE with any touch Pain Descriptors / Indicators: Discomfort  Pain Intervention(s): Limited activity within patient's tolerance;Monitored during session;Repositioned    Home Living Family/patient expects to be discharged to:: Skilled nursing facility Living Arrangements: Spouse/significant other Available Help at Discharge:  Family;Available 24 hours/day             Additional Comments: pt from SNF following recent hospitalization, states she has 14 days left, but is hopefull to return as she states she had made good progress    Prior Function Level of Independence: Needs assistance   Gait / Transfers Assistance Needed: walking with RW at rehab for 50-100 ft distances, pt reports also using WC for mobility at rehab, but was able to propell herself  ADL's / Homemaking Assistance Needed: pt reports sponge baths at home, able to get herself dressed but is currently unable to reach feet        Hand Dominance   Dominant Hand: Left    Extremity/Trunk Assessment   Upper Extremity Assessment Upper Extremity Assessment: Generalized weakness    Lower Extremity Assessment Lower Extremity Assessment: Generalized weakness;RLE deficits/detail RLE Deficits / Details: ROM limited by soft tissue mass, painful to touch distal to R knee. limited AROM due to weakness RLE: Unable to fully assess due to pain LLE Deficits / Details: grossly 4-/5, decreased swelling compared to RLE    Cervical / Trunk Assessment Cervical / Trunk Assessment: Other exceptions Cervical / Trunk Exceptions: large body habitus  Communication   Communication: No difficulties  Cognition Arousal/Alertness: Awake/alert Behavior During Therapy: WFL for tasks assessed/performed Overall Cognitive Status: Impaired/Different from baseline Area of Impairment: Orientation;Memory                 Orientation Level: Disoriented to;Place;Time Current Attention Level: Sustained           General Comments: pt following all instructions, but unable to recall where we were (kept saying she was going to have to go to [employer]), or day of week      General Comments General comments (skin integrity, edema, etc.): VSS on 1L    Exercises     Assessment/Plan    PT Assessment Patient needs continued PT services  PT Problem List  Decreased strength;Decreased mobility;Decreased safety awareness;Decreased range of motion;Decreased activity tolerance;Cardiopulmonary status limiting activity;Decreased balance;Decreased knowledge of use of DME;Pain;Decreased cognition;Decreased knowledge of precautions       PT Treatment Interventions DME instruction;Therapeutic activities;Gait training;Therapeutic exercise;Balance training;Functional mobility training;Cognitive remediation;Patient/family education    PT Goals (Current goals can be found in the Care Plan section)  Acute Rehab PT Goals Patient Stated Goal: to go home PT Goal Formulation: With patient/family Time For Goal Achievement: 02/04/21 Potential to Achieve Goals: Good    Frequency Min 3X/week    AM-PAC PT "6 Clicks" Mobility  Outcome Measure Help needed turning from your back to your side while in a flat bed without using bedrails?: A Little Help needed moving from lying on your back to sitting on the side of a flat bed without using bedrails?: A Lot Help needed moving to and from a bed to a chair (including a wheelchair)?: A Lot Help needed standing up from a chair using your arms (e.g., wheelchair or bedside chair)?: A Lot Help needed to walk in hospital room?: A Lot Help needed climbing 3-5 steps with a railing? : Total 6 Click Score: 12    End of Session         PT Visit Diagnosis: Muscle weakness (generalized) (M62.81);Other abnormalities of gait and mobility (R26.89);Other symptoms and signs involving the  nervous system (R29.898) Pain - Right/Left: Right Pain - part of body: Leg    Time: 7628-3151 PT Time Calculation (min) (ACUTE ONLY): 32 min   Charges:   PT Evaluation $PT Eval Moderate Complexity: 1 Mod PT Treatments $Gait Training: 8-22 mins        Vickki Muff, PT, DPT   Acute Rehabilitation Department Pager #: [phone]  Ronnie Derby 02/08/2021, 5:39 PM

## 2021-02-08 NOTE — Progress Notes (Signed)
PROGRESS NOTE  [name]    DOB: 11-Jul-1950, 70 y.o.  ZOX:[id number]  PCP: [name], MD   Code Status: Full Code   DOA: 02/07/2021   LOS: 1  Brief Narrative of Current Hospitalization  [name] is a 70 y.o. female with a PMH significant for chronic lymphedmea on Les, OSA, seizure disorder, acute renal failure, ileus They presented from home to the ED on 02/07/2021 with RLE swelling, pain, redness x several days of gradual worsening. In the ED, it was found that they had possible cellulitis vs worsening of chronic venous stasis. They were treated with IV Abx.  Patient was admitted to medicine service for further workup and management of LE cellulitis as outlined in detail below.  02/08/21 -improving  Assessment & Plan  Principal Problem:   Cellulitis and abscess of foot Active Problems:   Seizure disorder (HCC)   OSA on CPAP   Lymphedema of right lower extremity   Hypokalemia   Cellulitis   Acute renal failure (ARF) (HCC)  Cellulitis- continues to be afebrile. Endorses improving pain. Skin changes suggest chronic venous changes.  - unna boots - continue CTX - monitor fever curve - elevate legs - PT/OT - SCDs  OSA - CPAP nightly  Seizure disorder -continue home meds- phenobarbitol  AKI  hypokalemia. K+ 3.1 today hyponatremia Na 132 today - continue Ivm fluids - receiving kdur x2 today - RFP am  DVT prophylaxis: lovenox   Diet:  Diet Orders (From admission, onward)     Start     Ordered   02/07/21 2256  Diet Heart Room service appropriate? Yes; Fluid consistency: Thin  Diet effective now       Question Answer Comment  Room service appropriate? Yes   Fluid consistency: Thin      02/07/21 2256            Subjective 02/08/21    Pt reports improvement in her pain since admission. No other complaints at this time  Disposition Plan & Communication  Status is: Inpatient  Remains inpatient appropriate because:Ongoing diagnostic  testing needed not appropriate for outpatient work up  Dispo: The patient is from: Home              Anticipated d/c is to: Home              Patient currently is not medically stable to d/c.   Difficult to place patient No  Family Communication: none   Consults, Procedures, Significant Events  Consultants:  none  Procedures/significant events:  none  Antimicrobials:  Anti-infectives (From admission, onward)    Start     Dose/Rate Route Frequency Ordered Stop   02/08/21 1600  cefTRIAXone (ROCEPHIN) 2 g in sodium chloride 0.9 % 100 mL IVPB        2 g 200 mL/hr over 30 Minutes Intravenous Every 24 hours 02/07/21 2256 02/14/21 1559   02/07/21 1545  cefTRIAXone (ROCEPHIN) 2 g in sodium chloride 0.9 % 100 mL IVPB        2 g 200 mL/hr over 30 Minutes Intravenous  Once 02/07/21 1538 02/07/21 1638        Objective   Vitals:   02/07/21 2018 02/07/21 2141 02/07/21 2300 02/08/21 0300  BP: (!) 155/78 (!) 120/57 132/61 114/61  Pulse: 90 79 77 73  Resp:  15 20 (!) 21  Temp:  98.8 F (37.1 C) 98.5 F (36.9 C) 98.6 F (37 C)  TempSrc:  Oral Oral Oral  SpO2: 97% 93% 94% 96%  Weight:  116.6 kg    Height:  5\' 2"  (1.575 m)      Intake/Output Summary (Last 24 hours) at 02/08/2021 0735 Last data filed at 02/07/2021 1638 Gross per 24 hour  Intake 100 ml  Output --  Net 100 ml   Filed Weights   02/07/21 2141  Weight: 116.6 kg    Patient BMI: Body mass index is 47.02 kg/m.   Physical Exam: General: awake, alert, NAD HEENT: atraumatic, clear conjunctiva, anicteric sclera, moist mucus membranes, hearing grossly normal Respiratory: CTAB, no wheezes, rales or rhonchi, normal respiratory effort. Cardiovascular: normal S1/S2,  RRR, no JVD, murmurs, rubs, gallops, quick capillary refill  Gastrointestinal: soft, NT, ND, no HSM felt Nervous: A&O x4. no gross focal neurologic deficits, normal speech Extremities: moves all equally, non-pitting LE edema (worse on right), normal tone.  Painful to touch on right. Mild erythema with extreme chronic skin changes  Skin: dry, intact, normal temperature, No ulcers Psychiatry: normal mood, congruent affect  Labs   I have personally reviewed following labs and imaging studies Admission on 02/07/2021  Component Date Value Ref Range Status   WBC 02/07/2021 10.5  4.0 - 10.5 K/uL Final   RBC 02/07/2021 3.71 (A) 3.87 - 5.11 MIL/uL Final   Hemoglobin 02/07/2021 11.5 (A) 12.0 - 15.0 g/dL Final   HCT 02/09/2021 33.2 (A) 36.0 - 46.0 % Final   MCV 02/07/2021 89.5  80.0 - 100.0 fL Final   MCH 02/07/2021 31.0  26.0 - 34.0 pg Final   MCHC 02/07/2021 34.6  30.0 - 36.0 g/dL Final   RDW 02/09/2021 13.6  11.5 - 15.5 % Final   Platelets 02/07/2021 335  150 - 400 K/uL Final   nRBC 02/07/2021 0.0  0.0 - 0.2 % Final   Neutrophils Relative % 02/07/2021 72  % Final   Neutro Abs 02/07/2021 7.6  1.7 - 7.7 K/uL Final   Lymphocytes Relative 02/07/2021 18  % Final   Lymphs Abs 02/07/2021 1.9  0.7 - 4.0 K/uL Final   Monocytes Relative 02/07/2021 8  % Final   Monocytes Absolute 02/07/2021 0.9  0.1 - 1.0 K/uL Final   Eosinophils Relative 02/07/2021 1  % Final   Eosinophils Absolute 02/07/2021 0.1  0.0 - 0.5 K/uL Final   Basophils Relative 02/07/2021 0  % Final   Basophils Absolute 02/07/2021 0.0  0.0 - 0.1 K/uL Final   Immature Granulocytes 02/07/2021 1  % Final   Abs Immature Granulocytes 02/07/2021 0.05  0.00 - 0.07 K/uL Final   Sodium 02/07/2021 135  135 - 145 mmol/L Final   Potassium 02/07/2021 2.7 (A) 3.5 - 5.1 mmol/L Final   Chloride 02/07/2021 92 (A) 98 - 111 mmol/L Final   CO2 02/07/2021 30  22 - 32 mmol/L Final   Glucose, Bld 02/07/2021 111 (A) 70 - 99 mg/dL Final   BUN 02/09/2021 19  8 - 23 mg/dL Final   Creatinine, Ser 02/07/2021 1.31 (A) 0.44 - 1.00 mg/dL Final   Calcium 02/09/2021 9.6  8.9 - 10.3 mg/dL Final   Total Protein 02/54/2706 7.6  6.5 - 8.1 g/dL Final   Albumin 23/76/2831 3.8  3.5 - 5.0 g/dL Final   AST 51/76/1607 39  15 -  41 U/L Final   ALT 02/07/2021 44  0 - 44 U/L Final   Alkaline Phosphatase 02/07/2021 93  38 - 126 U/L Final   Total Bilirubin 02/07/2021 0.4  0.3 - 1.2 mg/dL Final   GFR, Estimated  02/07/2021 44 (A) >60 mL/min Final   Anion gap 02/07/2021 13  5 - 15 Final   Lactic Acid, Venous 02/07/2021 1.1  0.5 - 1.9 mmol/L Final   Specimen Description 02/07/2021 BLOOD LEFT HAND   Final   Special Requests 02/07/2021    Final                   Value:BOTTLES DRAWN AEROBIC AND ANAEROBIC Blood Culture adequate volume Performed at [facility], [address], [place], [place] [address]    Culture 02/07/2021 PENDING   Incomplete   Report Status 02/07/2021 PENDING   Incomplete   Specimen Description 02/07/2021    Final                   Value:BLOOD LEFT ANTECUBITAL Performed at [facility], [address] [place], [place] [address]    Special Requests 02/07/2021    Final                   Value:BOTTLES DRAWN AEROBIC ONLY Blood Culture adequate volume Performed at [facility], [address] [place], [place] [address]    Culture 02/07/2021 PENDING   Incomplete   Report Status 02/07/2021 PENDING   Incomplete   SARS Coronavirus 2 by RT PCR 02/07/2021 NEGATIVE  NEGATIVE Final   Influenza A by PCR 02/07/2021 NEGATIVE  NEGATIVE Final   Influenza B by PCR 02/07/2021 NEGATIVE  NEGATIVE Final   Total CK 02/07/2021 146  38 - 234 U/L Final   Sodium 02/08/2021 132 (A) 135 - 145 mmol/L Final   Potassium 02/08/2021 3.1 (A) 3.5 - 5.1 mmol/L Final   Chloride 02/08/2021 92 (A) 98 - 111 mmol/L Final   CO2 02/08/2021 29  22 - 32 mmol/L Final   Glucose, Bld 02/08/2021 108 (A) 70 - 99 mg/dL Final   BUN 91/47/8295 17  8 - 23 mg/dL Final   Creatinine, Ser 02/08/2021 1.39 (A) 0.44 - 1.00 mg/dL Final   Calcium 62/13/0865 8.8 (A) 8.9 - 10.3 mg/dL Final   GFR, Estimated 02/08/2021 41 (A) >60 mL/min Final   Anion gap 02/08/2021 11  5 - 15 Final   WBC 02/08/2021 9.8  4.0 - 10.5 K/uL Final    RBC 02/08/2021 3.52 (A) 3.87 - 5.11 MIL/uL Final   Hemoglobin 02/08/2021 11.1 (A) 12.0 - 15.0 g/dL Final   HCT 78/46/9629 32.0 (A) 36.0 - 46.0 % Final   MCV 02/08/2021 90.9  80.0 - 100.0 fL Final   MCH 02/08/2021 31.5  26.0 - 34.0 pg Final   MCHC 02/08/2021 34.7  30.0 - 36.0 g/dL Final   RDW 52/84/1324 13.8  11.5 - 15.5 % Final   Platelets 02/08/2021 344  150 - 400 K/uL Final   nRBC 02/08/2021 0.0  0.0 - 0.2 % Final     Recent Results (from the past 240 hour(s))  Resp Panel by RT-PCR (Flu A&B, Covid) Nasopharyngeal Swab     Status: None   Collection Time: 01/31/21  6:47 PM   Specimen: Nasopharyngeal Swab; Nasopharyngeal(NP) swabs in vial transport medium  Result Value Ref Range Status   SARS Coronavirus 2 by RT PCR NEGATIVE NEGATIVE Final    Comment: (NOTE) SARS-CoV-2 target nucleic acids are NOT DETECTED.  The SARS-CoV-2 RNA is generally detectable in upper respiratory specimens during the acute phase of infection. The lowest concentration of SARS-CoV-2 viral copies this assay can detect is 138 copies/mL. A negative result does not preclude SARS-Cov-2 infection  and should not be used as the sole basis for treatment or other patient management decisions. A negative result may occur with  improper specimen collection/handling, submission of specimen other than nasopharyngeal swab, presence of viral mutation(s) within the areas targeted by this assay, and inadequate number of viral copies(<138 copies/mL). A negative result must be combined with clinical observations, patient history, and epidemiological information. The expected result is Negative.  Fact Sheet for Patients:  BloggerCourse.com  Fact Sheet for Healthcare Providers:  SeriousBroker.it  This test is no t yet approved or cleared by the [place] FDA and  has been authorized for detection and/or diagnosis of SARS-CoV-2 by FDA under an Emergency Use Authorization  (EUA). This EUA will remain  in effect (meaning this test can be used) for the duration of the COVID-19 declaration under Section 564(b)(1) of the Act, 21 U.S.C.section 360bbb-3(b)(1), unless the authorization is terminated  or revoked sooner.       Influenza A by PCR NEGATIVE NEGATIVE Final   Influenza B by PCR NEGATIVE NEGATIVE Final    Comment: (NOTE) The Xpert Xpress SARS-CoV-2/FLU/RSV plus assay is intended as an aid in the diagnosis of influenza from Nasopharyngeal swab specimens and should not be used as a sole basis for treatment. Nasal washings and aspirates are unacceptable for Xpert Xpress SARS-CoV-2/FLU/RSV testing.  Fact Sheet for Patients: BloggerCourse.com  Fact Sheet for Healthcare Providers: SeriousBroker.it  This test is not yet approved or cleared by the [place] FDA and has been authorized for detection and/or diagnosis of SARS-CoV-2 by FDA under an Emergency Use Authorization (EUA). This EUA will remain in effect (meaning this test can be used) for the duration of the COVID-19 declaration under Section 564(b)(1) of the Act, 21 U.S.C. section 360bbb-3(b)(1), unless the authorization is terminated or revoked.  Performed at [facility], [address], [place], [place] [address]   Blood culture (routine x 2)     Status: None (Preliminary result)   Collection Time: 02/07/21  2:43 PM   Specimen: BLOOD LEFT HAND  Result Value Ref Range Status   Specimen Description BLOOD LEFT HAND  Final   Special Requests   Final    BOTTLES DRAWN AEROBIC AND ANAEROBIC Blood Culture adequate volume Performed at [facility], [address], [place], [place] [address]    Culture PENDING  Incomplete   Report Status PENDING  Incomplete  Blood culture (routine x 2)     Status: None (Preliminary result)   Collection Time: 02/07/21  2:43 PM   Specimen: BLOOD  Result Value Ref Range Status   Specimen  Description   Final    BLOOD LEFT ANTECUBITAL Performed at [facility], [address] [place], [place] [address]    Special Requests   Final    BOTTLES DRAWN AEROBIC ONLY Blood Culture adequate volume Performed at [facility], [address] [place], [place] [address]    Culture PENDING  Incomplete   Report Status PENDING  Incomplete  Resp Panel by RT-PCR (Flu A&B, Covid) Nasopharyngeal Swab     Status: None   Collection Time: 02/07/21  4:12 PM   Specimen: Nasopharyngeal Swab; Nasopharyngeal(NP) swabs in vial transport medium  Result Value Ref Range Status   SARS Coronavirus 2 by RT PCR NEGATIVE NEGATIVE Final    Comment: (NOTE) SARS-CoV-2 target nucleic acids are NOT DETECTED.  The SARS-CoV-2 RNA is generally detectable in upper respiratory specimens during the acute phase of infection. The lowest concentration  of SARS-CoV-2 viral copies this assay can detect is 138 copies/mL. A negative result does not preclude SARS-Cov-2 infection and should not be used as the sole basis for treatment or other patient management decisions. A negative result may occur with  improper specimen collection/handling, submission of specimen other than nasopharyngeal swab, presence of viral mutation(s) within the areas targeted by this assay, and inadequate number of viral copies(<138 copies/mL). A negative result must be combined with clinical observations, patient history, and epidemiological information. The expected result is Negative.  Fact Sheet for Patients:  BloggerCourse.com  Fact Sheet for Healthcare Providers:  SeriousBroker.it  This test is no t yet approved or cleared by the [place] FDA and  has been authorized for detection and/or diagnosis of SARS-CoV-2 by FDA under an Emergency Use Authorization (EUA). This EUA will remain  in effect (meaning this test can be used) for the duration of the COVID-19  declaration under Section 564(b)(1) of the Act, 21 U.S.C.section 360bbb-3(b)(1), unless the authorization is terminated  or revoked sooner.       Influenza A by PCR NEGATIVE NEGATIVE Final   Influenza B by PCR NEGATIVE NEGATIVE Final    Comment: (NOTE) The Xpert Xpress SARS-CoV-2/FLU/RSV plus assay is intended as an aid in the diagnosis of influenza from Nasopharyngeal swab specimens and should not be used as a sole basis for treatment. Nasal washings and aspirates are unacceptable for Xpert Xpress SARS-CoV-2/FLU/RSV testing.  Fact Sheet for Patients: BloggerCourse.com  Fact Sheet for Healthcare Providers: SeriousBroker.it  This test is not yet approved or cleared by the [place] FDA and has been authorized for detection and/or diagnosis of SARS-CoV-2 by FDA under an Emergency Use Authorization (EUA). This EUA will remain in effect (meaning this test can be used) for the duration of the COVID-19 declaration under Section 564(b)(1) of the Act, 21 U.S.C. section 360bbb-3(b)(1), unless the authorization is terminated or revoked.  Performed at [facility], [address], [place], [place] [address]      Imaging Studies  CT Tibia Fibula Right Wo Contrast  Result Date: 02/07/2021 CLINICAL DATA:  Soft tissue mass/swelling EXAM: CT OF THE LOWER RIGHT EXTREMITY WITHOUT CONTRAST TECHNIQUE: Multidetector CT imaging of the right lower extremity was performed according to the standard protocol. COMPARISON:  Ultrasound 02/07/2021 FINDINGS: Bones/Joint/Cartilage Severe osteoarthritis of the knee. The distal third of the fibula is absent. Plate and screw fixator the distal tibia is incorporated into the bone and surrounded by bone distally. There is substantial deformity tibiotalar fusion. Plantar calcaneal spur. No knee joint effusion. The distal femur sits somewhat anteriorly on the tibial plateau, cannot exclude ACL  insufficiency. Ligaments Suboptimally assessed by CT. Muscles and Tendons Muscular atrophy. Mildly thickened tibialis posterior tendon distally is partially surrounded by bone. Type 2 accessory navicular. Soft tissues Substantial cutaneous thickening posteriorly and medially in the proximal calf, extending circumferentially in the distal calf and along the ankle, and tracking into the dorsum of the foot. This is notably asymmetric compared to the visualized portion of the contralateral side. There is likewise substantial abnormal infiltration of the subcutaneous tissues posterolaterally along the proximal calf, and circumferentially in the distal calf. Medially the level of the mid calf, there is confluent but still infiltrative density in the subcutaneous tissues, for example measuring about 8.1 by 5.4 by 8.1 cm on image 120 series 5 and image 54 series 7. This could be residua of a hematoma given the mixed density, and given the infiltrative adipose tissue I am skeptical  that this represents an abscess or a mass. There other areas of similar although less striking confluence laterally in the calf including a proximal lesion measuring about 3.6 cm on image 42 and a mid calf lateral lesion measuring 3.9 cm craniocaudad on image 47 series 7. These also seem to appear to be areas of more confluent infiltration rather than abscess or masslike appearance. IMPRESSION: 1. Substantial cutaneous and subcutaneous edema in the right calf from lymphedema or similar process, with more confluent regions especially medially along the mid calf but also in a couple of locations in the lateral calf which could represent residua from prior subcutaneous hematoma (is the patient anticoagulated)) or simply more confluent edema. This is infiltrated with subcutaneous adipose tissue and is not characteristic of malignancy or abscess. 2. Severe osteoarthritis, right knee. 3. Resected distal fibula.  Tibiotalar fusion. Electronically Signed    By: Gaylyn Rong M.D.   On: 02/07/2021 17:59   US Venous Img Lower Right (DVT Study)  Result Date: 02/07/2021 CLINICAL DATA:  Right leg swelling.  Palpable lump. EXAM: RIGHT LOWER EXTREMITY VENOUS DOPPLER ULTRASOUND TECHNIQUE: Gray-scale sonography with graded compression, as well as color Doppler and duplex ultrasound were performed to evaluate the lower extremity deep venous systems from the level of the common femoral vein and including the common femoral, femoral, profunda femoral, popliteal and calf veins including the posterior tibial, peroneal and gastrocnemius veins when visible. The superficial great saphenous vein was also interrogated. Spectral Doppler was utilized to evaluate flow at rest and with distal augmentation maneuvers in the common femoral, femoral and popliteal veins. COMPARISON:  None. FINDINGS: Contralateral Common Femoral Vein: Respiratory phasicity is normal and symmetric with the symptomatic side. No evidence of thrombus. Normal compressibility. Common Femoral Vein: No evidence of thrombus. Normal compressibility, respiratory phasicity and response to augmentation. Saphenofemoral Junction: No evidence of thrombus. Normal compressibility and flow on color Doppler imaging. Profunda Femoral Vein: No evidence of thrombus. Normal compressibility and flow on color Doppler imaging. Femoral Vein: No evidence of thrombus. Normal compressibility, respiratory phasicity and response to augmentation. Popliteal Vein: Limited evaluation. Unable to perform compression. Normal color Doppler flow in the right popliteal vein. Calf Veins: Visualized right deep calf veins are patent without thrombus. Limited evaluation Superficial Great Saphenous Vein: No evidence of thrombus. Normal compressibility. Other Findings: Area of concern along the anterior calf was evaluated. There is poorly defined heterogeneous soft tissue in this area. IMPRESSION: 1. Negative for deep venous thrombosis in right lower  extremity. Technically challenging examination with limited evaluation of the right popliteal vein and right deep calf veins as described. 2. Palpable lump in the anterior right calf. There is poorly defined heterogeneous soft tissue in this area. This area is poorly characterized with ultrasound. Electronically Signed   By: Richarda Overlie M.D.   On: 02/07/2021 15:57   DG Abd Portable 1V  Result Date: 02/08/2021 CLINICAL DATA:  Encounter for nausea and vomiting EXAM: PORTABLE ABDOMEN - 1 VIEW COMPARISON:  01/27/2021 FINDINGS: Gaseous distension of ascending and descending colonic segments to a mild degree. Nearly normal bowel gas pattern. Lobulated luminal contour at the ascending colon not clearly changed from CT of the abdomen 01/27/2021. No concerning mass effect or calcification. Grossly clear lung bases. Pattern. IMPRESSION: Significantly improved bowel gas pattern since CT 01/27/2021. No new abnormality. Electronically Signed   By: Tiburcio Pea M.D.   On: 02/08/2021 05:04   Medications   Scheduled Meds:  aspirin EC  81 mg Oral BID  enoxaparin (LOVENOX) injection  55 mg Subcutaneous QHS   ferrous sulfate  650 mg Oral Q breakfast   hydrocortisone cream  1 application Topical TID   magnesium oxide  800 mg Oral QHS   pantoprazole  40 mg Oral QHS   PHENobarbital  129.6 mg Oral QHS   potassium chloride  40 mEq Oral Q6H   pramipexole  0.25 mg Oral QPM   pramipexole  0.75 mg Oral QHS   rosuvastatin  20 mg Oral Daily   thiamine  100 mg Oral Daily     LOS: 1 day   Time spent: >2min  Leeroy Bock, DO [facility] 02/08/2021, 7:35 AM   To contact the [facility] Attending or Consulting provider for this patient: Check the care team in [facility] for a) attending/consulting [facility] provider listed and b) the [facility] team listed Log into [email or web address] and use [place]'s universal password to access. If you do not have the password, please contact the hospital operator. Locate the [facility] provider you  are looking for under [facility] and page to a number that you can be directly reached. If you still have difficulty reaching the provider, please page the [facility] (Director on Call) for the Hospitalists listed on amion for assistance.

## 2021-02-09 DIAGNOSIS — I890 Lymphedema, not elsewhere classified: Secondary | ICD-10-CM

## 2021-02-09 DIAGNOSIS — E876 Hypokalemia: Secondary | ICD-10-CM

## 2021-02-09 LAB — BASIC METABOLIC PANEL
Anion gap: 10 (ref 5–15)
BUN: 10 mg/dL (ref 8–23)
CO2: 27 mmol/L (ref 22–32)
Calcium: 8.5 mg/dL — ABNORMAL LOW (ref 8.9–10.3)
Chloride: 100 mmol/L (ref 98–111)
Creatinine, Ser: 0.74 mg/dL (ref 0.44–1.00)
GFR, Estimated: >60 mL/min (ref >60)
Glucose, Bld: 92 mg/dL (ref 70–99)
Potassium: 3.3 mmol/L — ABNORMAL LOW (ref 3.5–5.1)
Sodium: 137 mmol/L (ref 135–145)

## 2021-02-09 LAB — MRSA NEXT GEN BY PCR, NASAL: MRSA by PCR Next Gen: NOT DETECTED

## 2021-02-09 LAB — CBC
HCT: 29 % — ABNORMAL LOW (ref 36.0–46.0)
Hemoglobin: 9.8 g/dL — ABNORMAL LOW (ref 12.0–15.0)
MCH: 31.2 pg (ref 26.0–34.0)
MCHC: 33.8 g/dL (ref 30.0–36.0)
MCV: 92.4 fL (ref 80.0–100.0)
Platelets: 306 10*3/uL (ref 150–400)
RBC: 3.14 MIL/uL — ABNORMAL LOW (ref 3.87–5.11)
RDW: 13.8 % (ref 11.5–15.5)
WBC: 8.3 10*3/uL (ref 4.0–10.5)
nRBC: 0 % (ref 0.0–0.2)

## 2021-02-09 LAB — D-DIMER, QUANTITATIVE: D-Dimer, Quant: 2.07 ug/mL-FEU — ABNORMAL HIGH (ref 0.00–0.50)

## 2021-02-09 MED ORDER — MAGNESIUM SULFATE 2 GM/50ML IV SOLN
2.0000 g | Freq: Once | INTRAVENOUS | Status: AC
  Administered 2021-02-09: 2 g via INTRAVENOUS
  Filled 2021-02-09: qty 50

## 2021-02-09 MED ORDER — POTASSIUM CHLORIDE CRYS ER 20 MEQ PO TBCR
40.0000 meq | EXTENDED_RELEASE_TABLET | Freq: Once | ORAL | Status: AC
  Administered 2021-02-09: 40 meq via ORAL

## 2021-02-09 MED ORDER — POTASSIUM CHLORIDE CRYS ER 20 MEQ PO TBCR
40.0000 meq | EXTENDED_RELEASE_TABLET | ORAL | Status: AC
  Administered 2021-02-09: 40 meq via ORAL
  Filled 2021-02-09 (×2): qty 2

## 2021-02-09 NOTE — Social Work (Signed)
CSW contacted pt spouse Acadia Thammavong to speak about pt DC planning, pt is not oriented x4. CSW asked about pt DC plan to a SNF and Mr.Siddique expressed that pt has been to [facility] the past two admissions to the hospital and he does not want to speak with CSW about DC plan until she is better. Mr. Allaire sounded very unpleasant and adamant about his spouse having to come back to the hospital for the third time. He thinks pt is being DC too soon therefore not seeing a need to speak about a DC plan. CSW messaged the MD to provide update and pass along the message from the pt spouse to discuss pt care.

## 2021-02-09 NOTE — Progress Notes (Signed)
Orthopedic Tech Progress Note Patient Details:  [name] 1950-09-21 [id number]  A layer of soft cotton padding and ace wraps were applied to BLE. Once both wraps were applied pt noted some discomfort, stated she wanted to try to keep them on for as long as she could tolerate. Jarod (ortho tech) and I left the room to let Amina, RN, know we had seen this pt. While doing so, pt's husband came out of the room to have [place] take the ace wraps off immediately. Cut off ace wraps and padding as pt did not want [place] to lift her legs to unwrap the bandage.   Ortho Devices Type of Ortho Device: Ace wrap, Cotton web roll Ortho Device/Splint Location: BLE Ortho Device/Splint Interventions: Application, Removal, Adjustment   Post Interventions Patient Tolerated: Poor, Unable to use device properly Instructions Provided: Care of device  [name] [name] 02/09/2021, 12:24 PM

## 2021-02-09 NOTE — Progress Notes (Signed)
Orthopedic Tech Progress Note Patient Details:  [name] [birth year] [id number]  Attempted to apply unna boots to pt, pt had immediate burning sensation of her Lt leg when unna boot gauze roll was applied. Pt communicated she did not think she could tolerate the burning if we continued. Jarod (ortho tech) unwrapped the gauze and we cleaned the remaining unna boot paste from her foot.   Spoke with Delaney Meigs, RN who messaged Dr. Dareen Piano about possible options/plan for this pt. Pt had previously had unna boots applied (01/13/21) at a prior hospital stay where she also experienced burning/pain and had to have them removed as well. Just coban was applied on 01/13/21 to avoid the burning with the unna boot paste (calamine), but that was removed as well as it was still intolerable.  Ortho Devices Type of Ortho Device: Radio broadcast assistant Ortho Device/Splint Location: BLE Ortho Device/Splint Interventions: Removal, Application, Ordered   Post Interventions Patient Tolerated: Refused intervention, Unable to use device properly, Poor  Docia Furl 02/09/2021, 11:21 AM

## 2021-02-09 NOTE — Progress Notes (Signed)
PROGRESS NOTE  [name]    DOB: 07/14/1950, 70 y.o.  QAS:[id number]  PCP: [name], MD   Code Status: Full Code   DOA: 02/07/2021   LOS: 2  Brief Narrative of Current Hospitalization  [name] is a 70 y.o. female with a PMH significant for chronic lymphedmea on Les, OSA, seizure disorder, acute renal failure, ileus They presented from home to the ED on 02/07/2021 with RLE swelling, pain, redness x several days of gradual worsening. In the ED, it was found that they had possible cellulitis vs worsening of chronic venous stasis. They were treated with IV Abx.  Patient was admitted to medicine service for further workup and management of LE cellulitis as outlined in detail below.  02/09/21 -improving  Assessment & Plan  Principal Problem:   Cellulitis and abscess of foot Active Problems:   Seizure disorder (HCC)   OSA on CPAP   Lymphedema of right lower extremity   Hypokalemia   Cellulitis   Acute renal failure (ARF) (HCC)  LE Cellulitis- continues to be afebrile. Endorses improving pain. Skin changes suggest chronic venous changes. Erythema appears improved from yesterday.  - unna boots - continue CTX, consider changing to PO tomorrow if still improving - monitor fever curve - elevate legs - PT/OT - SCDs  OSA- chronic, stable - CPAP nightly  Seizure disorder -continue home meds- phenobarbitol  AKI  hypokalemia. K+ 3.3 today  hyponatremia- resolved. Na 137 today. Replacing magnesium with 2mg  IV in addition to oral replacement - continue Ivm fluids - receiving kdur x2 today - RFP am  Normocytic anemia- Hgb decreased today 11.1>9.8. no signs of overt bleeding. Likely partially dilutional.  - CBC am - monitor for signs of bleeding  Physical deconditioning- PT recommending SNF at discharge. I discussed this with patient today and she is agreeable to this. - TOC consult for SNF placement  DVT prophylaxis: lovenox   Diet:  Diet Orders (From  admission, onward)     Start     Ordered   02/07/21 2256  Diet Heart Room service appropriate? Yes; Fluid consistency: Thin  Diet effective now       Question Answer Comment  Room service appropriate? Yes   Fluid consistency: Thin      02/07/21 2256            Subjective 02/09/21    Pt reports improvement today. She did not receive unna boots yesterday and does not have feet elevated  Disposition Plan & Communication  Status is: Inpatient  Remains inpatient appropriate because:Ongoing diagnostic testing needed not appropriate for outpatient work up  Dispo: The patient is from: Home              Anticipated d/c is to: Home              Patient currently is not medically stable to d/c.   Difficult to place patient No  Family Communication: none   Consults, Procedures, Significant Events  Consultants:  none  Procedures/significant events:  none  Antimicrobials:  Anti-infectives (From admission, onward)    Start     Dose/Rate Route Frequency Ordered Stop   02/08/21 1600  cefTRIAXone (ROCEPHIN) 2 g in sodium chloride 0.9 % 100 mL IVPB        2 g 200 mL/hr over 30 Minutes Intravenous Every 24 hours 02/07/21 2256 02/14/21 1559   02/07/21 1545  cefTRIAXone (ROCEPHIN) 2 g in sodium chloride 0.9 % 100 mL IVPB  2 g 200 mL/hr over 30 Minutes Intravenous  Once 02/07/21 1538 02/07/21 1638        Objective   Vitals:   02/08/21 2056 02/08/21 2058 02/09/21 0013 02/09/21 0526  BP: 132/63 132/63 (!) 121/44 (!) 148/45  Pulse: 79 79 90 78  Resp: (!) 23 19 20 20   Temp:  97.9 F (36.6 C) 98.1 F (36.7 C) 97.8 F (36.6 C)  TempSrc:  Oral Oral Oral  SpO2: 93% 95% 100% 98%  Weight:      Height:        Intake/Output Summary (Last 24 hours) at 02/09/2021 0709 Last data filed at 02/09/2021 0500 Gross per 24 hour  Intake 837 ml  Output --  Net 837 ml    Filed Weights   02/07/21 2141  Weight: 116.6 kg    Patient BMI: Body mass index is 47.02 kg/m.   Physical  Exam: General: awake, alert, NAD HEENT: atraumatic, clear conjunctiva, anicteric sclera, moist mucus membranes, hearing grossly normal Respiratory: CTAB, no wheezes, rales or rhonchi, normal respiratory effort. Cardiovascular: normal S1/S2,  RRR, no JVD, murmurs, rubs, gallops, quick capillary refill  Gastrointestinal: soft, NT, ND, no HSM felt Nervous: A&O x4. no gross focal neurologic deficits, normal speech Extremities: moves all equally, non-pitting LE edema (worse on right), normal tone. Painful to touch on right. Mild erythema with extreme chronic skin changes  Skin: dry, intact, normal temperature, No ulcers Psychiatry: normal mood, congruent affect  Labs   I have personally reviewed following labs and imaging studies Admission on 02/07/2021  Component Date Value Ref Range Status   WBC 02/07/2021 10.5  4.0 - 10.5 K/uL Final   RBC 02/07/2021 3.71 (A) 3.87 - 5.11 MIL/uL Final   Hemoglobin 02/07/2021 11.5 (A) 12.0 - 15.0 g/dL Final   HCT 82/70/7867 33.2 (A) 36.0 - 46.0 % Final   MCV 02/07/2021 89.5  80.0 - 100.0 fL Final   MCH 02/07/2021 31.0  26.0 - 34.0 pg Final   MCHC 02/07/2021 34.6  30.0 - 36.0 g/dL Final   RDW 54/49/2010 13.6  11.5 - 15.5 % Final   Platelets 02/07/2021 335  150 - 400 K/uL Final   nRBC 02/07/2021 0.0  0.0 - 0.2 % Final   Neutrophils Relative % 02/07/2021 72  % Final   Neutro Abs 02/07/2021 7.6  1.7 - 7.7 K/uL Final   Lymphocytes Relative 02/07/2021 18  % Final   Lymphs Abs 02/07/2021 1.9  0.7 - 4.0 K/uL Final   Monocytes Relative 02/07/2021 8  % Final   Monocytes Absolute 02/07/2021 0.9  0.1 - 1.0 K/uL Final   Eosinophils Relative 02/07/2021 1  % Final   Eosinophils Absolute 02/07/2021 0.1  0.0 - 0.5 K/uL Final   Basophils Relative 02/07/2021 0  % Final   Basophils Absolute 02/07/2021 0.0  0.0 - 0.1 K/uL Final   Immature Granulocytes 02/07/2021 1  % Final   Abs Immature Granulocytes 02/07/2021 0.05  0.00 - 0.07 K/uL Final   Sodium 02/07/2021 135  135 -  145 mmol/L Final   Potassium 02/07/2021 2.7 (A) 3.5 - 5.1 mmol/L Final   Chloride 02/07/2021 92 (A) 98 - 111 mmol/L Final   CO2 02/07/2021 30  22 - 32 mmol/L Final   Glucose, Bld 02/07/2021 111 (A) 70 - 99 mg/dL Final   BUN 11/19/1973 19  8 - 23 mg/dL Final   Creatinine, Ser 02/07/2021 1.31 (A) 0.44 - 1.00 mg/dL Final   Calcium 88/32/5498 9.6  8.9 - 10.3  mg/dL Final   Total Protein 49/44/9675 7.6  6.5 - 8.1 g/dL Final   Albumin 91/63/8466 3.8  3.5 - 5.0 g/dL Final   AST 59/93/5701 39  15 - 41 U/L Final   ALT 02/07/2021 44  0 - 44 U/L Final   Alkaline Phosphatase 02/07/2021 93  38 - 126 U/L Final   Total Bilirubin 02/07/2021 0.4  0.3 - 1.2 mg/dL Final   GFR, Estimated 02/07/2021 44 (A) >60 mL/min Final   Anion gap 02/07/2021 13  5 - 15 Final   Lactic Acid, Venous 02/07/2021 1.1  0.5 - 1.9 mmol/L Final   Specimen Description 02/07/2021 BLOOD LEFT HAND   Final   Special Requests 02/07/2021 BOTTLES DRAWN AEROBIC AND ANAEROBIC Blood Culture adequate volume   Final   Culture 02/07/2021    Final                   Value:NO GROWTH < 24 HOURS Performed at [facility], [address], [place], [place] [address]    Report Status 02/07/2021 PENDING   Incomplete   Specimen Description 02/07/2021    Final                   Value:BLOOD LEFT ANTECUBITAL Performed at [facility], [address], [place], [place] [address]    Special Requests 02/07/2021 BOTTLES DRAWN AEROBIC ONLY Blood Culture adequate volume   Final   Culture 02/07/2021    Final                   Value:NO GROWTH < 24 HOURS Performed at [facility], [address], [place], [place] [address]    Report Status 02/07/2021 PENDING   Incomplete   SARS Coronavirus 2 by RT PCR 02/07/2021 NEGATIVE  NEGATIVE Final   Influenza A by PCR 02/07/2021 NEGATIVE  NEGATIVE Final   Influenza B by PCR 02/07/2021 NEGATIVE  NEGATIVE Final   Total CK 02/07/2021 146  38 - 234 U/L Final   Sodium 02/08/2021 132 (A) 135 -  145 mmol/L Final   Potassium 02/08/2021 3.1 (A) 3.5 - 5.1 mmol/L Final   Chloride 02/08/2021 92 (A) 98 - 111 mmol/L Final   CO2 02/08/2021 29  22 - 32 mmol/L Final   Glucose, Bld 02/08/2021 108 (A) 70 - 99 mg/dL Final   BUN 76/22/6333 17  8 - 23 mg/dL Final   Creatinine, Ser 02/08/2021 1.39 (A) 0.44 - 1.00 mg/dL Final   Calcium 54/56/2563 8.8 (A) 8.9 - 10.3 mg/dL Final   GFR, Estimated 02/08/2021 41 (A) >60 mL/min Final   Anion gap 02/08/2021 11  5 - 15 Final   WBC 02/08/2021 9.8  4.0 - 10.5 K/uL Final   RBC 02/08/2021 3.52 (A) 3.87 - 5.11 MIL/uL Final   Hemoglobin 02/08/2021 11.1 (A) 12.0 - 15.0 g/dL Final   HCT 89/37/3428 32.0 (A) 36.0 - 46.0 % Final   MCV 02/08/2021 90.9  80.0 - 100.0 fL Final   MCH 02/08/2021 31.5  26.0 - 34.0 pg Final   MCHC 02/08/2021 34.7  30.0 - 36.0 g/dL Final   RDW 76/81/1572 13.8  11.5 - 15.5 % Final   Platelets 02/08/2021 344  150 - 400 K/uL Final   nRBC 02/08/2021 0.0  0.0 - 0.2 % Final   C Diff antigen 02/08/2021 NEGATIVE  NEGATIVE Final   C Diff toxin 02/08/2021 NEGATIVE  NEGATIVE Final   C Diff interpretation 02/08/2021 No C. difficile detected.  Final   Magnesium 02/08/2021 1.3 (A) 1.7 - 2.4 mg/dL Final   D-Dimer, Quant 02/09/2021 2.07 (A) 0.00 - 0.50 ug/mL-FEU Final     Recent Results (from the past 240 hour(s))  Resp Panel by RT-PCR (Flu A&B, Covid) Nasopharyngeal Swab     Status: None   Collection Time: 01/31/21  6:47 PM   Specimen: Nasopharyngeal Swab; Nasopharyngeal(NP) swabs in vial transport medium  Result Value Ref Range Status   SARS Coronavirus 2 by RT PCR NEGATIVE NEGATIVE Final    Comment: (NOTE) SARS-CoV-2 target nucleic acids are NOT DETECTED.  The SARS-CoV-2 RNA is generally detectable in upper respiratory specimens during the acute phase of infection. The lowest concentration of SARS-CoV-2 viral copies this assay can detect is 138 copies/mL. A negative result does not preclude SARS-Cov-2 infection and should not be used as the  sole basis for treatment or other patient management decisions. A negative result may occur with  improper specimen collection/handling, submission of specimen other than nasopharyngeal swab, presence of viral mutation(s) within the areas targeted by this assay, and inadequate number of viral copies(<138 copies/mL). A negative result must be combined with clinical observations, patient history, and epidemiological information. The expected result is Negative.  Fact Sheet for Patients:  BloggerCourse.com  Fact Sheet for Healthcare Providers:  SeriousBroker.it  This test is no t yet approved or cleared by the [place] FDA and  has been authorized for detection and/or diagnosis of SARS-CoV-2 by FDA under an Emergency Use Authorization (EUA). This EUA will remain  in effect (meaning this test can be used) for the duration of the COVID-19 declaration under Section 564(b)(1) of the Act, 21 U.S.C.section 360bbb-3(b)(1), unless the authorization is terminated  or revoked sooner.       Influenza A by PCR NEGATIVE NEGATIVE Final   Influenza B by PCR NEGATIVE NEGATIVE Final    Comment: (NOTE) The Xpert Xpress SARS-CoV-2/FLU/RSV plus assay is intended as an aid in the diagnosis of influenza from Nasopharyngeal swab specimens and should not be used as a sole basis for treatment. Nasal washings and aspirates are unacceptable for Xpert Xpress SARS-CoV-2/FLU/RSV testing.  Fact Sheet for Patients: BloggerCourse.com  Fact Sheet for Healthcare Providers: SeriousBroker.it  This test is not yet approved or cleared by the [place] FDA and has been authorized for detection and/or diagnosis of SARS-CoV-2 by FDA under an Emergency Use Authorization (EUA). This EUA will remain in effect (meaning this test can be used) for the duration of the COVID-19 declaration under Section 564(b)(1) of the  Act, 21 U.S.C. section 360bbb-3(b)(1), unless the authorization is terminated or revoked.  Performed at [facility], [address] [place], [place] [address]   Blood culture (routine x 2)     Status: None (Preliminary result)   Collection Time: 02/07/21  2:43 PM   Specimen: BLOOD LEFT HAND  Result Value Ref Range Status   Specimen Description BLOOD LEFT HAND  Final   Special Requests   Final    BOTTLES DRAWN AEROBIC AND ANAEROBIC Blood Culture adequate volume   Culture   Final    NO GROWTH < 24 HOURS Performed at [facility], [address], [place], [place] [address]    Report Status PENDING  Incomplete  Blood culture (routine x 2)     Status: None (Preliminary result)   Collection Time: 02/07/21  2:43 PM   Specimen: BLOOD  Result Value Ref Range Status   Specimen Description   Final  BLOOD LEFT ANTECUBITAL Performed at [facility], [address], [place], [place] [address]    Special Requests   Final    BOTTLES DRAWN AEROBIC ONLY Blood Culture adequate volume   Culture   Final    NO GROWTH < 24 HOURS Performed at [facility], [address], [place], [place] [address]    Report Status PENDING  Incomplete  Resp Panel by RT-PCR (Flu A&B, Covid) Nasopharyngeal Swab     Status: None   Collection Time: 02/07/21  4:12 PM   Specimen: Nasopharyngeal Swab; Nasopharyngeal(NP) swabs in vial transport medium  Result Value Ref Range Status   SARS Coronavirus 2 by RT PCR NEGATIVE NEGATIVE Final    Comment: (NOTE) SARS-CoV-2 target nucleic acids are NOT DETECTED.  The SARS-CoV-2 RNA is generally detectable in upper respiratory specimens during the acute phase of infection. The lowest concentration of SARS-CoV-2 viral copies this assay can detect is 138 copies/mL. A negative result does not preclude SARS-Cov-2 infection and should not be used as the sole basis for treatment or other patient management decisions. A negative result may  occur with  improper specimen collection/handling, submission of specimen other than nasopharyngeal swab, presence of viral mutation(s) within the areas targeted by this assay, and inadequate number of viral copies(<138 copies/mL). A negative result must be combined with clinical observations, patient history, and epidemiological information. The expected result is Negative.  Fact Sheet for Patients:  BloggerCourse.com  Fact Sheet for Healthcare Providers:  SeriousBroker.it  This test is no t yet approved or cleared by the [place] FDA and  has been authorized for detection and/or diagnosis of SARS-CoV-2 by FDA under an Emergency Use Authorization (EUA). This EUA will remain  in effect (meaning this test can be used) for the duration of the COVID-19 declaration under Section 564(b)(1) of the Act, 21 U.S.C.section 360bbb-3(b)(1), unless the authorization is terminated  or revoked sooner.       Influenza A by PCR NEGATIVE NEGATIVE Final   Influenza B by PCR NEGATIVE NEGATIVE Final    Comment: (NOTE) The Xpert Xpress SARS-CoV-2/FLU/RSV plus assay is intended as an aid in the diagnosis of influenza from Nasopharyngeal swab specimens and should not be used as a sole basis for treatment. Nasal washings and aspirates are unacceptable for Xpert Xpress SARS-CoV-2/FLU/RSV testing.  Fact Sheet for Patients: BloggerCourse.com  Fact Sheet for Healthcare Providers: SeriousBroker.it  This test is not yet approved or cleared by the [place] FDA and has been authorized for detection and/or diagnosis of SARS-CoV-2 by FDA under an Emergency Use Authorization (EUA). This EUA will remain in effect (meaning this test can be used) for the duration of the COVID-19 declaration under Section 564(b)(1) of the Act, 21 U.S.C. section 360bbb-3(b)(1), unless the authorization is terminated  or revoked.  Performed at [facility], [address], [place], [place] [address]   C Difficile Quick Screen w PCR reflex     Status: None   Collection Time: 02/08/21  8:55 PM   Specimen: STOOL  Result Value Ref Range Status   C Diff antigen NEGATIVE NEGATIVE Final   C Diff toxin NEGATIVE NEGATIVE Final   C Diff interpretation No C. difficile detected.  Final    Comment: Performed at [facility], [address] [place], [place] [address]     Imaging Studies  No results found. Medications   Scheduled Meds:  aspirin EC  81 mg Oral BID   enoxaparin (LOVENOX) injection  55 mg Subcutaneous QHS   ferrous sulfate  650 mg Oral Q breakfast   hydrocortisone cream  1 application Topical TID   magnesium oxide  800 mg Oral QHS   pantoprazole  40 mg Oral QHS   PHENobarbital  129.6 mg Oral QHS   pramipexole  0.25 mg Oral QPM   pramipexole  0.75 mg Oral QHS   rosuvastatin  20 mg Oral Daily   thiamine  100 mg Oral Daily     LOS: 2 days   Time spent: >50min  Leeroy Bock, DO [facility] 02/09/2021, 7:09 AM   To contact the [facility] Attending or Consulting provider for this patient: Check the care team in [facility] for a) attending/consulting TRH provider listed and b) the [facility] team listed Log into [email or web address] and use [facility] universal password to access. If you do not have the password, please contact the hospital operator. Locate the [facility] provider you are looking for under [facility] and page to a number that you can be directly reached. If you still have difficulty reaching the provider, please page the [facility] (Director on Call) for the Hospitalists listed on amion for assistance.

## 2021-02-09 NOTE — NC FL2 (Signed)
[place] MEDICAID FL2 LEVEL OF CARE SCREENING TOOL     IDENTIFICATION  Patient Name: [name] Birthdate: [birth year] Sex: female Admission Date (Current Location): 02/07/2021  [facility] and [place] Number:  Producer, television/film/video and Address:  [facility], [address] [place], [place] [address]      Provider Number: [id number]  Attending Physician Name and Address:  Leeroy Bock, MD  Relative Name and Phone Number:  Karter Haire [phone]    Current Level of Care: Hospital Recommended Level of Care: Skilled Nursing Facility Prior Approval Number:    Date Approved/Denied:   PASRR Number: [id number]  Discharge Plan: SNF    Current Diagnoses: Patient Active Problem List   Diagnosis Date Noted   Cellulitis and abscess of foot 02/07/2021   Acute renal failure (ARF) (HCC) 01/19/2021   ARF (acute renal failure) (HCC) 01/18/2021   SIRS (systemic inflammatory response syndrome) (HCC) 01/09/2021   Acute encephalopathy 01/09/2021   Hyponatremia 12/20/2018   Cellulitis 12/20/2018   Cellulitis of left lower extremity 12/19/2018   Hypokalemia 02/17/2017   DOE (dyspnea on exertion) 01/08/2017   Medication management 01/08/2017   Weight gain 01/08/2017   Lymphedema of right lower extremity 10/22/2016   Wound dehiscence, surgical, subsequent encounter 05/12/2016   S/P ankle arthrodesis 03/12/2016   OSA on CPAP 09/10/2015   Nocturia more than twice per night 09/10/2015   Morbid obesity due to excess calories (HCC) 09/10/2015   Loss of consciousness (HCC) 08/15/2015   Fracture of ankle, trimalleolar, closed 07/03/2015   Syncope 07/02/2015   Seizure disorder (HCC) 07/02/2015   Essential hypertension 07/02/2015   Bilateral lower extremity edema 07/02/2015   Closed right ankle fracture 07/02/2015   GERD (gastroesophageal reflux disease) 07/02/2015   HLD (hyperlipidemia) 07/02/2015   Anosmia/chronic 07/02/2015   Leukocytosis 07/02/2015    Restless legs syndrome (RLS) 04/28/2013   Sleep apnea with use of continuous positive airway pressure (CPAP) 01/24/2013    Orientation RESPIRATION BLADDER Height & Weight     Self, Situation  Normal (2L nasal cannula, has home cpap machine) Incontinent, External catheter Weight: 257 lb 0.9 oz (116.6 kg) Height:  5\' 2"  (157.5 cm)  BEHAVIORAL SYMPTOMS/MOOD NEUROLOGICAL BOWEL NUTRITION STATUS      Continent Diet (See DC summary)  AMBULATORY STATUS COMMUNICATION OF NEEDS Skin   Extensive Assist Verbally PU Stage and Appropriate Care (PI thigh and buttocks)                       Personal Care Assistance Level of Assistance  Bathing, Feeding, Dressing Bathing Assistance: Limited assistance Feeding assistance: Independent Dressing Assistance: Limited assistance     Functional Limitations Info  Sight, Hearing, Speech Sight Info: Adequate Hearing Info: Adequate Speech Info: Adequate    SPECIAL CARE FACTORS FREQUENCY  PT (By licensed PT), OT (By licensed OT)                    Contractures Contractures Info: Not present    Additional Factors Info  Code Status, Allergies Code Status Info: Full Allergies Info: Compazine   Prochlorperazine Maleate   Topamax (Topiramate)   Codeine Sulfate   Other   Seasonal Ic (Cholestatin)   Adhesive (Tape)   Iodinated Diagnostic Agents           Current Medications (02/09/2021):  This is the current hospital active medication list Current Facility-Administered Medications  Medication Dose Route Frequency Provider Last Rate Last Admin  acetaminophen (TYLENOL) tablet 650 mg  650 mg Oral Q6H PRN Eduard Clos, MD   650 mg at 02/08/21 1439   Or   acetaminophen (TYLENOL) suppository 650 mg  650 mg Rectal Q6H PRN Eduard Clos, MD       albuterol (PROVENTIL) (2.5 MG/3ML) 0.083% nebulizer solution 2.5 mg  2.5 mg Inhalation Q4H PRN Eduard Clos, MD       aspirin EC tablet 81 mg  81 mg Oral BID Eduard Clos,  MD   81 mg at 02/09/21 0901   cefTRIAXone (ROCEPHIN) 2 g in sodium chloride 0.9 % 100 mL IVPB  2 g Intravenous Q24H Eduard Clos, MD 200 mL/hr at 02/08/21 1518 2 g at 02/08/21 1518   enoxaparin (LOVENOX) injection 55 mg  55 mg Subcutaneous QHS Eduard Clos, MD   55 mg at 02/08/21 2129   ferrous sulfate tablet 650 mg  650 mg Oral Q breakfast Eduard Clos, MD   650 mg at 02/09/21 0901   guaiFENesin-dextromethorphan (ROBITUSSIN DM) 100-10 MG/5ML syrup 5 mL  5 mL Oral Q4H PRN Leeroy Bock, MD   5 mL at 02/08/21 2129   hydrocortisone cream 1 % 1 application  1 application Topical TID Eduard Clos, MD   1 application at 02/09/21 4401   magnesium oxide (MAG-OX) tablet 800 mg  800 mg Oral QHS Eduard Clos, MD   800 mg at 02/08/21 2129   pantoprazole (PROTONIX) EC tablet 40 mg  40 mg Oral QHS Eduard Clos, MD   40 mg at 02/08/21 2129   PHENobarbital (LUMINAL) tablet 129.6 mg  129.6 mg Oral QHS Eduard Clos, MD   129.6 mg at 02/08/21 2129   pramipexole (MIRAPEX) tablet 0.25 mg  0.25 mg Oral QPM Eduard Clos, MD   0.25 mg at 02/08/21 1920   pramipexole (MIRAPEX) tablet 0.75 mg  0.75 mg Oral QHS Eduard Clos, MD   0.75 mg at 02/08/21 2128   rosuvastatin (CRESTOR) tablet 20 mg  20 mg Oral Daily Eduard Clos, MD   20 mg at 02/09/21 0901   thiamine tablet 100 mg  100 mg Oral Daily Eduard Clos, MD   100 mg at 02/09/21 0901     Discharge Medications: Please see discharge summary for a list of discharge medications.  Relevant Imaging Results:  Relevant Lab Results:   Additional Information SS#: [id number]. Pf[employer] 07/19/19, 08/09/19  Ivette Loyal, LCSWA

## 2021-02-10 LAB — BASIC METABOLIC PANEL
Anion gap: 9 (ref 5–15)
BUN: 7 mg/dL — ABNORMAL LOW (ref 8–23)
CO2: 27 mmol/L (ref 22–32)
Calcium: 8.8 mg/dL — ABNORMAL LOW (ref 8.9–10.3)
Chloride: 99 mmol/L (ref 98–111)
Creatinine, Ser: 0.7 mg/dL (ref 0.44–1.00)
GFR, Estimated: >60 mL/min (ref >60)
Glucose, Bld: 93 mg/dL (ref 70–99)
Potassium: 3.3 mmol/L — ABNORMAL LOW (ref 3.5–5.1)
Sodium: 135 mmol/L (ref 135–145)

## 2021-02-10 LAB — CBC
HCT: 30.8 % — ABNORMAL LOW (ref 36.0–46.0)
Hemoglobin: 10.3 g/dL — ABNORMAL LOW (ref 12.0–15.0)
MCH: 31.1 pg (ref 26.0–34.0)
MCHC: 33.4 g/dL (ref 30.0–36.0)
MCV: 93.1 fL (ref 80.0–100.0)
Platelets: 313 10*3/uL (ref 150–400)
RBC: 3.31 MIL/uL — ABNORMAL LOW (ref 3.87–5.11)
RDW: 13.9 % (ref 11.5–15.5)
WBC: 8.9 10*3/uL (ref 4.0–10.5)
nRBC: 0 % (ref 0.0–0.2)

## 2021-02-10 LAB — MAGNESIUM: Magnesium: 1.7 mg/dL (ref 1.7–2.4)

## 2021-02-10 MED ORDER — POTASSIUM CHLORIDE CRYS ER 20 MEQ PO TBCR
40.0000 meq | EXTENDED_RELEASE_TABLET | Freq: Three times a day (TID) | ORAL | Status: AC
  Administered 2021-02-10 (×3): 40 meq via ORAL
  Filled 2021-02-10 (×3): qty 2

## 2021-02-10 MED ORDER — TRIAMCINOLONE ACETONIDE 0.5 % EX OINT
TOPICAL_OINTMENT | Freq: Three times a day (TID) | CUTANEOUS | Status: AC
  Administered 2021-02-10: 1 via TOPICAL
  Filled 2021-02-10: qty 15

## 2021-02-10 MED ORDER — CEPHALEXIN 500 MG PO CAPS
500.0000 mg | ORAL_CAPSULE | Freq: Three times a day (TID) | ORAL | Status: DC
  Administered 2021-02-10 – 2021-02-12 (×7): 500 mg via ORAL
  Filled 2021-02-10 (×9): qty 1

## 2021-02-10 NOTE — Progress Notes (Signed)
Patient refusing SCD's at this time, states that her legs are "too painful to touch". Education given regarding SCD patient understands and states that she "has a pair at home".

## 2021-02-10 NOTE — Plan of Care (Signed)

## 2021-02-10 NOTE — Progress Notes (Signed)
PROGRESS NOTE  [name]    DOB: 1951-04-26, 70 y.o.  BZJ:[id number]  PCP: [name], MD   Code Status: Full Code   DOA: 02/07/2021   LOS: 3  Brief Narrative of Current Hospitalization  [name] is a 70 y.o. female with a PMH significant for chronic lymphedmea on Les, OSA, seizure disorder, acute renal failure, ileus They presented from home to the ED on 02/07/2021 with RLE swelling, pain, redness x several days of gradual worsening. In the ED, it was found that they had possible cellulitis vs worsening of chronic venous stasis. They were treated with IV Abx.  Patient was admitted to medicine service for further workup and management of LE cellulitis as outlined in detail below.  02/10/21 -improving  Assessment & Plan  Principal Problem:   Cellulitis and abscess of foot Active Problems:   Seizure disorder (HCC)   OSA on CPAP   Lymphedema of right lower extremity   Hypokalemia   Cellulitis   Acute renal failure (ARF) (HCC)  LE Cellulitis- continues to be afebrile. Endorses stable pain. Was unable to tolerate unna boots. Skin changes suggest chronic changes. Erythema appears improved from yesterday.  - CTX transitioned to keflex today - monitor fever curve - elevate legs - PT/OT - SCDs - trial of topical triamcinolone TID - consider derm consult  OSA- chronic, stable - CPAP nightly  Seizure disorder -continue home meds- phenobarbitol  AKI (resolved)  hypokalemia. K+ 3.3 today despite replacement yesterday.  hyponatremia- resolved. Na 137 today. Mg 1.7 today. - receiving kdur x3 today - RFP am  Normocytic anemia- Hgb decreased today 11.1>9.8>10.3 s/p IV fluid discontinuation. no signs of overt bleeding.  - monitor for signs of bleeding  Physical deconditioning- PT recommending SNF at discharge. I discussed this with patient today and she is agreeable to this. - TOC consult for SNF placement  DVT prophylaxis: lovenox   Diet:  Diet Orders  (From admission, onward)     Start     Ordered   02/07/21 2256  Diet Heart Room service appropriate? Yes; Fluid consistency: Thin  Diet effective now       Question Answer Comment  Room service appropriate? Yes   Fluid consistency: Thin      02/07/21 2256            Subjective 02/10/21    Patient endorses continued pain in legs. Was unable to tolerate unna boots.   Disposition Plan & Communication  Status is: Inpatient  Remains inpatient appropriate because:Ongoing diagnostic testing needed not appropriate for outpatient work up  Dispo: The patient is from: Home              Anticipated d/c is to: Home              Patient currently is not medically stable to d/c.   Difficult to place patient No  Family Communication: none   Consults, Procedures, Significant Events  Consultants:  none  Procedures/significant events:  none  Antimicrobials:  Anti-infectives (From admission, onward)    Start     Dose/Rate Route Frequency Ordered Stop   02/08/21 1600  cefTRIAXone (ROCEPHIN) 2 g in sodium chloride 0.9 % 100 mL IVPB        2 g 200 mL/hr over 30 Minutes Intravenous Every 24 hours 02/07/21 2256 02/14/21 1559   02/07/21 1545  cefTRIAXone (ROCEPHIN) 2 g in sodium chloride 0.9 % 100 mL IVPB        2  g 200 mL/hr over 30 Minutes Intravenous  Once 02/07/21 1538 02/07/21 1638        Objective   Vitals:   02/09/21 1636 02/09/21 1949 02/09/21 2351 02/10/21 0438  BP: (!) 121/58 (!) 113/41 (!) 145/53 (!) 118/54  Pulse:  76 79 73  Resp: 18 20 20 20   Temp: 99.6 F (37.6 C) 98.2 F (36.8 C) 99.5 F (37.5 C) 97.8 F (36.6 C)  TempSrc: Oral Oral Oral Oral  SpO2: 98% 99% 98% 92%  Weight:      Height:       No intake or output data in the 24 hours ending 02/10/21 0713  Filed Weights   02/07/21 2141  Weight: 116.6 kg    Patient BMI: Body mass index is 47.02 kg/m.   Physical Exam: General: awake, alert, NAD HEENT: atraumatic, clear conjunctiva, anicteric sclera,  moist mucus membranes, hearing grossly normal Respiratory: CTAB, no wheezes, rales or rhonchi, normal respiratory effort. Cardiovascular: normal S1/S2,  RRR, no JVD, murmurs, rubs, gallops, quick capillary refill  Gastrointestinal: soft, NT, ND, no HSM felt Nervous: A&O x4. no gross focal neurologic deficits, normal speech Extremities: moves all equally, non-pitting LE edema (worse on right), normal tone. Painful to touch on right even with just slightest touch patient jumps. Mild erythema with extreme chronic skin changes  Skin: dry, intact, normal temperature, No ulcers Psychiatry: normal mood, congruent affect  Labs   I have personally reviewed following labs and imaging studies Admission on 02/07/2021  Component Date Value Ref Range Status   WBC 02/07/2021 10.5  4.0 - 10.5 K/uL Final   RBC 02/07/2021 3.71 (A) 3.87 - 5.11 MIL/uL Final   Hemoglobin 02/07/2021 11.5 (A) 12.0 - 15.0 g/dL Final   HCT 75/64/3329 33.2 (A) 36.0 - 46.0 % Final   MCV 02/07/2021 89.5  80.0 - 100.0 fL Final   MCH 02/07/2021 31.0  26.0 - 34.0 pg Final   MCHC 02/07/2021 34.6  30.0 - 36.0 g/dL Final   RDW 51/88/4166 13.6  11.5 - 15.5 % Final   Platelets 02/07/2021 335  150 - 400 K/uL Final   nRBC 02/07/2021 0.0  0.0 - 0.2 % Final   Neutrophils Relative % 02/07/2021 72  % Final   Neutro Abs 02/07/2021 7.6  1.7 - 7.7 K/uL Final   Lymphocytes Relative 02/07/2021 18  % Final   Lymphs Abs 02/07/2021 1.9  0.7 - 4.0 K/uL Final   Monocytes Relative 02/07/2021 8  % Final   Monocytes Absolute 02/07/2021 0.9  0.1 - 1.0 K/uL Final   Eosinophils Relative 02/07/2021 1  % Final   Eosinophils Absolute 02/07/2021 0.1  0.0 - 0.5 K/uL Final   Basophils Relative 02/07/2021 0  % Final   Basophils Absolute 02/07/2021 0.0  0.0 - 0.1 K/uL Final   Immature Granulocytes 02/07/2021 1  % Final   Abs Immature Granulocytes 02/07/2021 0.05  0.00 - 0.07 K/uL Final   Sodium 02/07/2021 135  135 - 145 mmol/L Final   Potassium 02/07/2021 2.7  (A) 3.5 - 5.1 mmol/L Final   Chloride 02/07/2021 92 (A) 98 - 111 mmol/L Final   CO2 02/07/2021 30  22 - 32 mmol/L Final   Glucose, Bld 02/07/2021 111 (A) 70 - 99 mg/dL Final   BUN 11/08/1599 19  8 - 23 mg/dL Final   Creatinine, Ser 02/07/2021 1.31 (A) 0.44 - 1.00 mg/dL Final   Calcium 09/32/3557 9.6  8.9 - 10.3 mg/dL Final   Total Protein 32/20/2542 7.6  6.5 -  8.1 g/dL Final   Albumin 82/95/6213 3.8  3.5 - 5.0 g/dL Final   AST 08/65/7846 39  15 - 41 U/L Final   ALT 02/07/2021 44  0 - 44 U/L Final   Alkaline Phosphatase 02/07/2021 93  38 - 126 U/L Final   Total Bilirubin 02/07/2021 0.4  0.3 - 1.2 mg/dL Final   GFR, Estimated 02/07/2021 44 (A) >60 mL/min Final   Anion gap 02/07/2021 13  5 - 15 Final   Lactic Acid, Venous 02/07/2021 1.1  0.5 - 1.9 mmol/L Final   Specimen Description 02/07/2021 BLOOD LEFT HAND   Final   Special Requests 02/07/2021 BOTTLES DRAWN AEROBIC AND ANAEROBIC Blood Culture adequate volume   Final   Culture 02/07/2021    Final                   Value:NO GROWTH 3 DAYS Performed at [facility], [address] [place], [place] [address]    Report Status 02/07/2021 PENDING   Incomplete   Specimen Description 02/07/2021    Final                   Value:BLOOD LEFT ANTECUBITAL Performed at [facility], [address] [place], [place] [address]    Special Requests 02/07/2021 BOTTLES DRAWN AEROBIC ONLY Blood Culture adequate volume   Final   Culture 02/07/2021    Final                   Value:NO GROWTH 3 DAYS Performed at [facility], [address], [place], [place] [address]    Report Status 02/07/2021 PENDING   Incomplete   SARS Coronavirus 2 by RT PCR 02/07/2021 NEGATIVE  NEGATIVE Final   Influenza A by PCR 02/07/2021 NEGATIVE  NEGATIVE Final   Influenza B by PCR 02/07/2021 NEGATIVE  NEGATIVE Final   Total CK 02/07/2021 146  38 - 234 U/L Final   Sodium 02/08/2021 132 (A) 135 - 145 mmol/L Final   Potassium 02/08/2021 3.1 (A) 3.5  - 5.1 mmol/L Final   Chloride 02/08/2021 92 (A) 98 - 111 mmol/L Final   CO2 02/08/2021 29  22 - 32 mmol/L Final   Glucose, Bld 02/08/2021 108 (A) 70 - 99 mg/dL Final   BUN 02/72/5366 17  8 - 23 mg/dL Final   Creatinine, Ser 02/08/2021 1.39 (A) 0.44 - 1.00 mg/dL Final   Calcium 44/07/4740 8.8 (A) 8.9 - 10.3 mg/dL Final   GFR, Estimated 02/08/2021 41 (A) >60 mL/min Final   Anion gap 02/08/2021 11  5 - 15 Final   WBC 02/08/2021 9.8  4.0 - 10.5 K/uL Final   RBC 02/08/2021 3.52 (A) 3.87 - 5.11 MIL/uL Final   Hemoglobin 02/08/2021 11.1 (A) 12.0 - 15.0 g/dL Final   HCT 59/56/3875 32.0 (A) 36.0 - 46.0 % Final   MCV 02/08/2021 90.9  80.0 - 100.0 fL Final   MCH 02/08/2021 31.5  26.0 - 34.0 pg Final   MCHC 02/08/2021 34.7  30.0 - 36.0 g/dL Final   RDW 64/33/2951 13.8  11.5 - 15.5 % Final   Platelets 02/08/2021 344  150 - 400 K/uL Final   nRBC 02/08/2021 0.0  0.0 - 0.2 % Final   C Diff antigen 02/08/2021 NEGATIVE  NEGATIVE Final   C Diff toxin 02/08/2021 NEGATIVE  NEGATIVE Final   C Diff interpretation 02/08/2021 No C. difficile detected.   Final   Magnesium 02/08/2021 1.3 (A) 1.7 - 2.4 mg/dL Final  D-Dimer, Quant 02/09/2021 2.07 (A) 0.00 - 0.50 ug/mL-FEU Final   WBC 02/09/2021 8.3  4.0 - 10.5 K/uL Final   RBC 02/09/2021 3.14 (A) 3.87 - 5.11 MIL/uL Final   Hemoglobin 02/09/2021 9.8 (A) 12.0 - 15.0 g/dL Final   HCT 72/01/4708 29.0 (A) 36.0 - 46.0 % Final   MCV 02/09/2021 92.4  80.0 - 100.0 fL Final   MCH 02/09/2021 31.2  26.0 - 34.0 pg Final   MCHC 02/09/2021 33.8  30.0 - 36.0 g/dL Final   RDW 62/83/6629 13.8  11.5 - 15.5 % Final   Platelets 02/09/2021 306  150 - 400 K/uL Final   nRBC 02/09/2021 0.0  0.0 - 0.2 % Final   Sodium 02/09/2021 137  135 - 145 mmol/L Final   Potassium 02/09/2021 3.3 (A) 3.5 - 5.1 mmol/L Final   Chloride 02/09/2021 100  98 - 111 mmol/L Final   CO2 02/09/2021 27  22 - 32 mmol/L Final   Glucose, Bld 02/09/2021 92  70 - 99 mg/dL Final   BUN 47/65/4650 10  8 - 23 mg/dL  Final   Creatinine, Ser 02/09/2021 0.74  0.44 - 1.00 mg/dL Final   Calcium 35/46/5681 8.5 (A) 8.9 - 10.3 mg/dL Final   GFR, Estimated 02/09/2021 >60  >60 mL/min Final   Anion gap 02/09/2021 10  5 - 15 Final   WBC 02/10/2021 8.9  4.0 - 10.5 K/uL Final   RBC 02/10/2021 3.31 (A) 3.87 - 5.11 MIL/uL Final   Hemoglobin 02/10/2021 10.3 (A) 12.0 - 15.0 g/dL Final   HCT 27/51/7001 30.8 (A) 36.0 - 46.0 % Final   MCV 02/10/2021 93.1  80.0 - 100.0 fL Final   MCH 02/10/2021 31.1  26.0 - 34.0 pg Final   MCHC 02/10/2021 33.4  30.0 - 36.0 g/dL Final   RDW 74/94/4967 13.9  11.5 - 15.5 % Final   Platelets 02/10/2021 313  150 - 400 K/uL Final   nRBC 02/10/2021 0.0  0.0 - 0.2 % Final   Sodium 02/10/2021 135  135 - 145 mmol/L Final   Potassium 02/10/2021 3.3 (A) 3.5 - 5.1 mmol/L Final   Chloride 02/10/2021 99  98 - 111 mmol/L Final   CO2 02/10/2021 27  22 - 32 mmol/L Final   Glucose, Bld 02/10/2021 93  70 - 99 mg/dL Final   BUN 59/16/3846 7 (A) 8 - 23 mg/dL Final   Creatinine, Ser 02/10/2021 0.70  0.44 - 1.00 mg/dL Final   Calcium 65/99/3570 8.8 (A) 8.9 - 10.3 mg/dL Final   GFR, Estimated 02/10/2021 >60  >60 mL/min Final   Anion gap 02/10/2021 9  5 - 15 Final   Magnesium 02/10/2021 1.7  1.7 - 2.4 mg/dL Final   MRSA by PCR Next Gen 02/09/2021 NOT DETECTED  NOT DETECTED Final    Imaging Studies  No results found. Medications   Scheduled Meds:  aspirin EC  81 mg Oral BID   enoxaparin (LOVENOX) injection  55 mg Subcutaneous QHS   ferrous sulfate  650 mg Oral Q breakfast   hydrocortisone cream  1 application Topical TID   magnesium oxide  800 mg Oral QHS   pantoprazole  40 mg Oral QHS   PHENobarbital  129.6 mg Oral QHS   pramipexole  0.25 mg Oral QPM   pramipexole  0.75 mg Oral QHS   rosuvastatin  20 mg Oral Daily   thiamine  100 mg Oral Daily     LOS: 3 days   Time spent: >56min  [name]  Jannette Fogo, DO [facility] 02/10/2021, 7:13 AM   To contact the [facility] Attending or Consulting  provider for this patient: Check the care team in [facility] for a) attending/consulting [facility] provider listed and b) the [facility] team listed Log into [email or web address] and use [place]'s universal password to access. If you do not have the password, please contact the hospital operator. Locate the [facility] provider you are looking for under [facility] and page to a number that you can be directly reached. If you still have difficulty reaching the provider, please page the [facility] (Director on Call) for the Hospitalists listed on amion for assistance.

## 2021-02-10 NOTE — Consult Note (Signed)
   [facility] Inpatient Consult   02/10/2021  NEKA BISE 12/24/1950 [id number]  [facility] [[facility]]  Accountable Care Organization [ACO] Patient: Pecola Lawless PPO  Patient screened for less than 7 days readmission hospitalization with noted high risk score for unplanned readmission risk.  Review of patient's medical record reveals patient is current plan is to return to skilled nursing facility.  Plan:  Continue to follow progress and disposition to assess for post hospital care management needs.    For questions contact:   Charlesetta Shanks, RN BSN CCM [facility]  [phone] business mobile phone Toll free office [phone]  Fax number: [phone] [email or web address]

## 2021-02-11 LAB — BASIC METABOLIC PANEL
Anion gap: 11 (ref 5–15)
BUN: 9 mg/dL (ref 8–23)
CO2: 23 mmol/L (ref 22–32)
Calcium: 8.9 mg/dL (ref 8.9–10.3)
Chloride: 101 mmol/L (ref 98–111)
Creatinine, Ser: 0.7 mg/dL (ref 0.44–1.00)
GFR, Estimated: >60 mL/min (ref >60)
Glucose, Bld: 96 mg/dL (ref 70–99)
Potassium: 4.2 mmol/L (ref 3.5–5.1)
Sodium: 135 mmol/L (ref 135–145)

## 2021-02-11 MED ORDER — GABAPENTIN 300 MG PO CAPS
300.0000 mg | ORAL_CAPSULE | Freq: Three times a day (TID) | ORAL | Status: DC
  Administered 2021-02-11 – 2021-02-12 (×3): 300 mg via ORAL
  Filled 2021-02-11 (×3): qty 1

## 2021-02-11 NOTE — Progress Notes (Signed)
PROGRESS NOTE  [name]    DOB: Jun 14, 1950, 70 y.o.  VFI:[id number]  PCP: [name], MD   Code Status: Full Code   DOA: 02/07/2021   LOS: 4  Brief Narrative of Current Hospitalization  [name] is a 70 y.o. female with a PMH significant for chronic lymphedmea on Les, OSA, seizure disorder, acute renal failure, ileus They presented from home to the ED on 02/07/2021 with RLE swelling, pain, redness x several days of gradual worsening. In the ED, it was found that they had possible cellulitis vs worsening of chronic venous stasis. They were treated with IV Abx.  Patient was admitted to medicine service for further workup and management of LE cellulitis as outlined in detail below.  02/11/21 -pain is worsened today.   Assessment & Plan  Principal Problem:   Cellulitis and abscess of foot Active Problems:   Seizure disorder (HCC)   OSA on CPAP   Lymphedema of right lower extremity   Hypokalemia   Cellulitis   Acute renal failure (ARF) (HCC)  LE Cellulitis- continues to be afebrile. Slightly worsened pain. Was unable to tolerate unna boots. Skin changes suggest chronic changes. Continue topical steroids. I believe patient would benefit from discharge to SNF so she can be followed by lymphedema clinic and/or dermatology outpatient.  - CTX transitioned to keflex yesterday - monitor fever curve - elevate legs - PT/OT - SCDs - continue topical triamcinolone TID  OSA- chronic, stable - CPAP nightly  Seizure disorder -continue home meds- phenobarbitol  AKI (resolved)  hypokalemia (resolved)  hyponatremia (resolved). - no labs ordered for am  Normocytic anemia- Hgb decreased today 11.1>9.8>10.3 s/p IV fluid discontinuation. no signs of overt bleeding.  - monitor for signs of bleeding  Physical deconditioning- PT recommending SNF at discharge. I discussed this with patient today and she is agreeable to this. - TOC consult for SNF placement  DVT prophylaxis:  lovenox   Diet:  Diet Orders (From admission, onward)     Start     Ordered   02/07/21 2256  Diet Heart Room service appropriate? Yes; Fluid consistency: Thin  Diet effective now       Question Answer Comment  Room service appropriate? Yes   Fluid consistency: Thin      02/07/21 2256            Subjective 02/11/21    Patient feels increased pain today but clinically her legs look much better. The skin has dried out.  Disposition Plan & Communication  Status is: Inpatient  Remains inpatient appropriate because:Ongoing diagnostic testing needed not appropriate for outpatient work up  Dispo: The patient is from: Home              Anticipated d/c is to: SNF              Patient currently is medically stable to d/c.   Difficult to place patient No  Family Communication: none   Consults, Procedures, Significant Events  Consultants:  none  Procedures/significant events:  none  Antimicrobials:  Anti-infectives (From admission, onward)    Start     Dose/Rate Route Frequency Ordered Stop   02/10/21 1600  cephALEXin (KEFLEX) capsule 500 mg        500 mg Oral Every 8 hours 02/10/21 1035 02/13/21 1359   02/08/21 1600  cefTRIAXone (ROCEPHIN) 2 g in sodium chloride 0.9 % 100 mL IVPB  Status:  Discontinued        2 g 200 mL/hr  over 30 Minutes Intravenous Every 24 hours 02/07/21 2256 02/10/21 1035   02/07/21 1545  cefTRIAXone (ROCEPHIN) 2 g in sodium chloride 0.9 % 100 mL IVPB        2 g 200 mL/hr over 30 Minutes Intravenous  Once 02/07/21 1538 02/07/21 1638        Objective   Vitals:   02/10/21 2300 02/10/21 2342 02/11/21 0300 02/11/21 0422  BP:  132/66  (!) 142/61  Pulse: 78 80  82  Resp:  17 17 16   Temp:  98.5 F (36.9 C)  98.5 F (36.9 C)  TempSrc:  Oral  Oral  SpO2: 97% 98%  96%  Weight:      Height:        Intake/Output Summary (Last 24 hours) at 02/11/2021 0718 Last data filed at 02/10/2021 1936 Gross per 24 hour  Intake 640 ml  Output 500 ml  Net  140 ml    Filed Weights   02/07/21 2141  Weight: 116.6 kg    Patient BMI: Body mass index is 47.02 kg/m.   Physical Exam: General: awake, alert, NAD HEENT: atraumatic, clear conjunctiva, anicteric sclera, moist mucus membranes, hearing grossly normal Respiratory: normal respiratory effort. Cardiovascular: normal S1/S2,  RRR, no JVD, murmurs, rubs, gallops, quick capillary refill  Gastrointestinal: soft, NT, ND, no HSM felt Nervous: A&O x4. no gross focal neurologic deficits, normal speech Extremities: moves all equally, non-pitting LE edema (worse on right), normal tone. Painful to touch on right. even with just slightest touch patient jumps. Mild erythema with extreme chronic skin changes.  Skin: dry, intact, normal temperature, No ulcers.skin on lower legs and feet is bark-like. Psychiatry: normal mood, congruent affect  Labs   I have personally reviewed following labs and imaging studies Admission on 02/07/2021  Component Date Value Ref Range Status   WBC 02/07/2021 10.5  4.0 - 10.5 K/uL Final   RBC 02/07/2021 3.71 (A) 3.87 - 5.11 MIL/uL Final   Hemoglobin 02/07/2021 11.5 (A) 12.0 - 15.0 g/dL Final   HCT 36/04/2448 33.2 (A) 36.0 - 46.0 % Final   MCV 02/07/2021 89.5  80.0 - 100.0 fL Final   MCH 02/07/2021 31.0  26.0 - 34.0 pg Final   MCHC 02/07/2021 34.6  30.0 - 36.0 g/dL Final   RDW 75/30/0511 13.6  11.5 - 15.5 % Final   Platelets 02/07/2021 335  150 - 400 K/uL Final   nRBC 02/07/2021 0.0  0.0 - 0.2 % Final   Neutrophils Relative % 02/07/2021 72  % Final   Neutro Abs 02/07/2021 7.6  1.7 - 7.7 K/uL Final   Lymphocytes Relative 02/07/2021 18  % Final   Lymphs Abs 02/07/2021 1.9  0.7 - 4.0 K/uL Final   Monocytes Relative 02/07/2021 8  % Final   Monocytes Absolute 02/07/2021 0.9  0.1 - 1.0 K/uL Final   Eosinophils Relative 02/07/2021 1  % Final   Eosinophils Absolute 02/07/2021 0.1  0.0 - 0.5 K/uL Final   Basophils Relative 02/07/2021 0  % Final   Basophils Absolute  02/07/2021 0.0  0.0 - 0.1 K/uL Final   Immature Granulocytes 02/07/2021 1  % Final   Abs Immature Granulocytes 02/07/2021 0.05  0.00 - 0.07 K/uL Final   Sodium 02/07/2021 135  135 - 145 mmol/L Final   Potassium 02/07/2021 2.7 (A) 3.5 - 5.1 mmol/L Final   Chloride 02/07/2021 92 (A) 98 - 111 mmol/L Final   CO2 02/07/2021 30  22 - 32 mmol/L Final   Glucose, Bld 02/07/2021  111 (A) 70 - 99 mg/dL Final   BUN 18/84/1660 19  8 - 23 mg/dL Final   Creatinine, Ser 02/07/2021 1.31 (A) 0.44 - 1.00 mg/dL Final   Calcium 63/05/6008 9.6  8.9 - 10.3 mg/dL Final   Total Protein 93/23/5573 7.6  6.5 - 8.1 g/dL Final   Albumin 22/06/5425 3.8  3.5 - 5.0 g/dL Final   AST 11/01/7626 39  15 - 41 U/L Final   ALT 02/07/2021 44  0 - 44 U/L Final   Alkaline Phosphatase 02/07/2021 93  38 - 126 U/L Final   Total Bilirubin 02/07/2021 0.4  0.3 - 1.2 mg/dL Final   GFR, Estimated 02/07/2021 44 (A) >60 mL/min Final   Anion gap 02/07/2021 13  5 - 15 Final   Lactic Acid, Venous 02/07/2021 1.1  0.5 - 1.9 mmol/L Final   Specimen Description 02/07/2021 BLOOD LEFT HAND   Final   Special Requests 02/07/2021 BOTTLES DRAWN AEROBIC AND ANAEROBIC Blood Culture adequate volume   Final   Culture 02/07/2021    Final                   Value:NO GROWTH 4 DAYS Performed at [facility], [address] [place], [place] [address]    Report Status 02/07/2021 PENDING   Incomplete   Specimen Description 02/07/2021    Final                   Value:BLOOD LEFT ANTECUBITAL Performed at [facility], [address], [place], [place] [address]    Special Requests 02/07/2021 BOTTLES DRAWN AEROBIC ONLY Blood Culture adequate volume   Final   Culture 02/07/2021    Final                   Value:NO GROWTH 4 DAYS Performed at [facility], [address] [place], [place] [address]    Report Status 02/07/2021 PENDING   Incomplete   SARS Coronavirus 2 by RT PCR 02/07/2021 NEGATIVE  NEGATIVE Final   Influenza A by PCR  02/07/2021 NEGATIVE  NEGATIVE Final   Influenza B by PCR 02/07/2021 NEGATIVE  NEGATIVE Final   Total CK 02/07/2021 146  38 - 234 U/L Final   Sodium 02/08/2021 132 (A) 135 - 145 mmol/L Final   Potassium 02/08/2021 3.1 (A) 3.5 - 5.1 mmol/L Final   Chloride 02/08/2021 92 (A) 98 - 111 mmol/L Final   CO2 02/08/2021 29  22 - 32 mmol/L Final   Glucose, Bld 02/08/2021 108 (A) 70 - 99 mg/dL Final   BUN 26/94/8546 17  8 - 23 mg/dL Final   Creatinine, Ser 02/08/2021 1.39 (A) 0.44 - 1.00 mg/dL Final   Calcium 27/07/5007 8.8 (A) 8.9 - 10.3 mg/dL Final   GFR, Estimated 02/08/2021 41 (A) >60 mL/min Final   Anion gap 02/08/2021 11  5 - 15 Final   WBC 02/08/2021 9.8  4.0 - 10.5 K/uL Final   RBC 02/08/2021 3.52 (A) 3.87 - 5.11 MIL/uL Final   Hemoglobin 02/08/2021 11.1 (A) 12.0 - 15.0 g/dL Final   HCT 38/18/2993 32.0 (A) 36.0 - 46.0 % Final   MCV 02/08/2021 90.9  80.0 - 100.0 fL Final   MCH 02/08/2021 31.5  26.0 - 34.0 pg Final   MCHC 02/08/2021 34.7  30.0 - 36.0 g/dL Final   RDW 71/69/6789 13.8  11.5 - 15.5 % Final   Platelets 02/08/2021 344  150 - 400 K/uL Final   nRBC 02/08/2021 0.0  0.0 - 0.2 % Final   C Diff antigen 02/08/2021 NEGATIVE  NEGATIVE Final   C Diff toxin 02/08/2021 NEGATIVE  NEGATIVE Final   C Diff interpretation 02/08/2021 No C. difficile detected.   Final   Magnesium 02/08/2021 1.3 (A) 1.7 - 2.4 mg/dL Final   D-Dimer, Quant 02/09/2021 2.07 (A) 0.00 - 0.50 ug/mL-FEU Final   WBC 02/09/2021 8.3  4.0 - 10.5 K/uL Final   RBC 02/09/2021 3.14 (A) 3.87 - 5.11 MIL/uL Final   Hemoglobin 02/09/2021 9.8 (A) 12.0 - 15.0 g/dL Final   HCT 46/96/2952 29.0 (A) 36.0 - 46.0 % Final   MCV 02/09/2021 92.4  80.0 - 100.0 fL Final   MCH 02/09/2021 31.2  26.0 - 34.0 pg Final   MCHC 02/09/2021 33.8  30.0 - 36.0 g/dL Final   RDW 84/13/2440 13.8  11.5 - 15.5 % Final   Platelets 02/09/2021 306  150 - 400 K/uL Final   nRBC 02/09/2021 0.0  0.0 - 0.2 % Final   Sodium 02/09/2021 137  135 - 145 mmol/L Final    Potassium 02/09/2021 3.3 (A) 3.5 - 5.1 mmol/L Final   Chloride 02/09/2021 100  98 - 111 mmol/L Final   CO2 02/09/2021 27  22 - 32 mmol/L Final   Glucose, Bld 02/09/2021 92  70 - 99 mg/dL Final   BUN 03/07/2535 10  8 - 23 mg/dL Final   Creatinine, Ser 02/09/2021 0.74  0.44 - 1.00 mg/dL Final   Calcium 64/40/3474 8.5 (A) 8.9 - 10.3 mg/dL Final   GFR, Estimated 02/09/2021 >60  >60 mL/min Final   Anion gap 02/09/2021 10  5 - 15 Final   WBC 02/10/2021 8.9  4.0 - 10.5 K/uL Final   RBC 02/10/2021 3.31 (A) 3.87 - 5.11 MIL/uL Final   Hemoglobin 02/10/2021 10.3 (A) 12.0 - 15.0 g/dL Final   HCT 25/95/6387 30.8 (A) 36.0 - 46.0 % Final   MCV 02/10/2021 93.1  80.0 - 100.0 fL Final   MCH 02/10/2021 31.1  26.0 - 34.0 pg Final   MCHC 02/10/2021 33.4  30.0 - 36.0 g/dL Final   RDW 56/43/3295 13.9  11.5 - 15.5 % Final   Platelets 02/10/2021 313  150 - 400 K/uL Final   nRBC 02/10/2021 0.0  0.0 - 0.2 % Final   Sodium 02/10/2021 135  135 - 145 mmol/L Final   Potassium 02/10/2021 3.3 (A) 3.5 - 5.1 mmol/L Final   Chloride 02/10/2021 99  98 - 111 mmol/L Final   CO2 02/10/2021 27  22 - 32 mmol/L Final   Glucose, Bld 02/10/2021 93  70 - 99 mg/dL Final   BUN 18/84/1660 7 (A) 8 - 23 mg/dL Final   Creatinine, Ser 02/10/2021 0.70  0.44 - 1.00 mg/dL Final   Calcium 63/05/6008 8.8 (A) 8.9 - 10.3 mg/dL Final   GFR, Estimated 02/10/2021 >60  >60 mL/min Final   Anion gap 02/10/2021 9  5 - 15 Final   Magnesium 02/10/2021 1.7  1.7 - 2.4 mg/dL Final   MRSA by PCR Next Gen 02/09/2021 NOT DETECTED  NOT DETECTED Final   Sodium 02/11/2021 135  135 - 145 mmol/L Final   Potassium 02/11/2021 4.2  3.5 - 5.1 mmol/L Final   Chloride 02/11/2021 101  98 - 111 mmol/L Final   CO2 02/11/2021 23  22 - 32 mmol/L Final   Glucose, Bld 02/11/2021 96  70 - 99 mg/dL Final   BUN 93/23/5573 9  8 - 23 mg/dL Final   Creatinine, Ser  02/11/2021 0.70  0.44 - 1.00 mg/dL Final   Calcium 91/47/8295 8.9  8.9 - 10.3 mg/dL Final   GFR, Estimated  02/11/2021 >60  >60 mL/min Final   Anion gap 02/11/2021 11  5 - 15 Final    Imaging Studies  No results found. Medications   Scheduled Meds:  aspirin EC  81 mg Oral BID   cephALEXin  500 mg Oral Q8H   enoxaparin (LOVENOX) injection  55 mg Subcutaneous QHS   ferrous sulfate  650 mg Oral Q breakfast   hydrocortisone cream  1 application Topical TID   magnesium oxide  800 mg Oral QHS   pantoprazole  40 mg Oral QHS   PHENobarbital  129.6 mg Oral QHS   pramipexole  0.25 mg Oral QPM   pramipexole  0.75 mg Oral QHS   rosuvastatin  20 mg Oral Daily   thiamine  100 mg Oral Daily   triamcinolone ointment   Topical TID     LOS: 4 days   Time spent: >28min  Leeroy Bock, DO [facility] 02/11/2021, 7:18 AM   To contact the [facility] Attending or Consulting provider for this patient: Check the care team in [facility] for a) attending/consulting [facility] provider listed and b) the [facility] team listed Log into [email or web address] and use [place]'s universal password to access. If you do not have the password, please contact the hospital operator. Locate the [facility] provider you are looking for under [facility] and page to a number that you can be directly reached. If you still have difficulty reaching the provider, please page the [facility] (Director on Call) for the Hospitalists listed on amion for assistance.

## 2021-02-11 NOTE — Progress Notes (Signed)
PIV consult: RN reports pt has been removing lines. No current IV medications. Recommend placing new IV if/when infusion is ordered to place best line for therapy and preserve vessels.

## 2021-02-11 NOTE — Progress Notes (Signed)
Physical Therapy Treatment Patient Details Name: [name] MRN: [id number] DOB: 06-Dec-1950 Today's Date: 02/11/2021   History of Present Illness Pt is a 70 y.o. female admitted from SNF on 02/07/21 with worsening RLE swelling and pain; workup for RLE cellulitis. Of note recent admission with BLE swelling, d/c to SNF on 02/01/21. Other PMH includes HTN, R ankle fusion, seizures, OSA, obesity.   PT Comments    Pt progressing with mobility. Today's session focused on seated therex and brief standing activity; deferred increased ambulation distance secondary to RLE pain and increased drainage from R lower leg wound with weight bearing (RN aware). Pt remains limited by pain, generalized weakness, decreased activity tolerance, and impaired balance strategies/postural reactions. Continue to recommend SNF-level therapies to maximize functional mobility and independence prior to return home.    Recommendations for follow up therapy are one component of a multi-disciplinary discharge planning process, led by the attending physician.  Recommendations may be updated based on patient status, additional functional criteria and insurance authorization.  Follow Up Recommendations  SNF;Supervision for mobility/OOB     Equipment Recommendations   (defer)    Recommendations for Other Services       Precautions / Restrictions Precautions Precautions: Fall Restrictions Weight Bearing Restrictions: No Other Position/Activity Restrictions: Attempted to decrease amount of time in WB as RLE wound oozing seems to increase with standing     Mobility  Bed Mobility Overal bed mobility: Needs Assistance Bed Mobility: Supine to Sit     Supine to sit: Supervision;HOB elevated     General bed mobility comments: Reliant on HOB elevated and use of bed rail, increased time and effort    Transfers Overall transfer level: Needs assistance Equipment used: Rolling walker (2 wheeled) Transfers: Sit to/from  Stand Sit to Stand: Mod assist         General transfer comment: Repeated cues for correct hand placement, pt reliant on momentum and modA for trunk elevation  Ambulation/Gait Ambulation/Gait assistance: Min assist Gait Distance (Feet): 4 Feet Assistive device: Rolling walker (2 wheeled) Gait Pattern/deviations: Step-to pattern;Decreased step length - left;Antalgic Gait velocity: Decreased   General Gait Details: Slow, antalgic side steps towards HOB with RW and minA for stability; deferred further distance secondary to RLE pain and increased drainage from R lower leg wound (RN aware)   Stairs             Wheelchair Mobility    Modified Rankin (Stroke Patients Only)       Balance Overall balance assessment: Needs assistance Sitting-balance support: No upper extremity supported;Feet supported Sitting balance-Leahy Scale: Good Sitting balance - Comments: prolonged time sitting EOB without UE support for LE therex; requires assist to don socks   Standing balance support: Bilateral upper extremity supported Standing balance-Leahy Scale: Poor Standing balance comment: reliant on BUE support                            Cognition Arousal/Alertness: Awake/alert Behavior During Therapy: WFL for tasks assessed/performed Overall Cognitive Status: Impaired/Different from baseline Area of Impairment: Attention;Memory;Problem solving                   Current Attention Level: Selective Memory: Decreased short-term memory       Problem Solving: Requires verbal cues General Comments: Pleasant and cooperative; requires redirection in conversation and task      Exercises General Exercises - Lower Extremity Long Arc Quad: AROM;Both;Seated (without UE support) Hip Flexion/Marching: AROM;Both;Seated (  with UE support, decreased tolerance for R hip flex) Toe Raises: AROM;Both;Seated (without UE support; noted ROM deficits in R ankle with h/o fusion) Heel  Raises: AROM;Both;Seated (without UE support; noted ROM deficits in R ankle with h/o fusion) Other Exercises Other Exercises: [employer] HEP handout provided (Access Code [facility])    General Comments General comments (skin integrity, edema, etc.): Pt's husband and daughter present, supportive. SpO2 97% on RA      Pertinent Vitals/Pain Pain Assessment: 0-10 Pain Score: 8  Pain Location: RLE with any touch Pain Descriptors / Indicators: Discomfort;Burning Pain Intervention(s): Monitored during session    Home Living                      Prior Function            PT Goals (current goals can now be found in the care plan section) Acute Rehab PT Goals Patient Stated Goal: return to rehab PT Goal Formulation: With patient/family Time For Goal Achievement: 02/25/21 Potential to Achieve Goals: Good Progress towards PT goals: Progressing toward goals    Frequency    Min 2X/week      PT Plan Frequency needs to be updated    Co-evaluation              AM-PAC PT "6 Clicks" Mobility   Outcome Measure  Help needed turning from your back to your side while in a flat bed without using bedrails?: A Little Help needed moving from lying on your back to sitting on the side of a flat bed without using bedrails?: A Lot Help needed moving to and from a bed to a chair (including a wheelchair)?: A Lot Help needed standing up from a chair using your arms (e.g., wheelchair or bedside chair)?: A Lot Help needed to walk in hospital room?: A Lot Help needed climbing 3-5 steps with a railing? : Total 6 Click Score: 12    End of Session   Activity Tolerance: Patient tolerated treatment well Patient left: in bed;with call bell/phone within reach;with family/visitor present (sitting EOB) Nurse Communication: Mobility status PT Visit Diagnosis: Muscle weakness (generalized) (M62.81);Other abnormalities of gait and mobility (R26.89);Other symptoms and signs involving the nervous  system (R29.898) Pain - Right/Left: Right Pain - part of body: Leg     Time: 1601-0932 PT Time Calculation (min) (ACUTE ONLY): 33 min  Charges:  $Therapeutic Exercise: 8-22 mins $Therapeutic Activity: 8-22 mins                     Ina Homes, PT, DPT Acute Rehabilitation Services  Pager [phone] Office [phone]  Malachy Chamber 02/11/2021, 4:59 PM

## 2021-02-11 NOTE — Care Management Important Message (Signed)
Important Message  Patient Details  Name: EZRI FANGUY MRN: [id number] Date of Birth: [birth year]   Medicare Important Message Given:  Yes     [name] 02/11/2021, 3:16 PM

## 2021-02-12 ENCOUNTER — Inpatient Hospital Stay (HOSPITAL_COMMUNITY): Payer: Medicare PPO

## 2021-02-12 DIAGNOSIS — Z6841 Body Mass Index (BMI) 40.0 and over, adult: Secondary | ICD-10-CM | POA: Diagnosis not present

## 2021-02-12 DIAGNOSIS — R531 Weakness: Secondary | ICD-10-CM | POA: Diagnosis not present

## 2021-02-12 DIAGNOSIS — I5032 Chronic diastolic (congestive) heart failure: Secondary | ICD-10-CM | POA: Diagnosis not present

## 2021-02-12 DIAGNOSIS — G40909 Epilepsy, unspecified, not intractable, without status epilepticus: Secondary | ICD-10-CM | POA: Diagnosis present

## 2021-02-12 DIAGNOSIS — I872 Venous insufficiency (chronic) (peripheral): Secondary | ICD-10-CM | POA: Diagnosis present

## 2021-02-12 DIAGNOSIS — Z9989 Dependence on other enabling machines and devices: Secondary | ICD-10-CM | POA: Diagnosis not present

## 2021-02-12 DIAGNOSIS — F419 Anxiety disorder, unspecified: Secondary | ICD-10-CM | POA: Diagnosis not present

## 2021-02-12 DIAGNOSIS — L02619 Cutaneous abscess of unspecified foot: Secondary | ICD-10-CM | POA: Diagnosis not present

## 2021-02-12 DIAGNOSIS — N179 Acute kidney failure, unspecified: Secondary | ICD-10-CM | POA: Diagnosis not present

## 2021-02-12 DIAGNOSIS — E7800 Pure hypercholesterolemia, unspecified: Secondary | ICD-10-CM | POA: Diagnosis present

## 2021-02-12 DIAGNOSIS — E785 Hyperlipidemia, unspecified: Secondary | ICD-10-CM | POA: Diagnosis not present

## 2021-02-12 DIAGNOSIS — U071 COVID-19: Secondary | ICD-10-CM | POA: Diagnosis present

## 2021-02-12 DIAGNOSIS — G4730 Sleep apnea, unspecified: Secondary | ICD-10-CM | POA: Diagnosis not present

## 2021-02-12 DIAGNOSIS — N390 Urinary tract infection, site not specified: Secondary | ICD-10-CM | POA: Diagnosis not present

## 2021-02-12 DIAGNOSIS — R059 Cough, unspecified: Secondary | ICD-10-CM | POA: Diagnosis not present

## 2021-02-12 DIAGNOSIS — I890 Lymphedema, not elsewhere classified: Secondary | ICD-10-CM | POA: Diagnosis present

## 2021-02-12 DIAGNOSIS — L03119 Cellulitis of unspecified part of limb: Secondary | ICD-10-CM | POA: Diagnosis not present

## 2021-02-12 DIAGNOSIS — G2581 Restless legs syndrome: Secondary | ICD-10-CM | POA: Diagnosis present

## 2021-02-12 DIAGNOSIS — I959 Hypotension, unspecified: Secondary | ICD-10-CM | POA: Diagnosis present

## 2021-02-12 DIAGNOSIS — M6281 Muscle weakness (generalized): Secondary | ICD-10-CM | POA: Diagnosis not present

## 2021-02-12 DIAGNOSIS — Z1623 Resistance to quinolones and fluoroquinolones: Secondary | ICD-10-CM | POA: Diagnosis present

## 2021-02-12 DIAGNOSIS — R569 Unspecified convulsions: Secondary | ICD-10-CM | POA: Diagnosis not present

## 2021-02-12 DIAGNOSIS — R4189 Other symptoms and signs involving cognitive functions and awareness: Secondary | ICD-10-CM | POA: Diagnosis present

## 2021-02-12 DIAGNOSIS — R4182 Altered mental status, unspecified: Secondary | ICD-10-CM | POA: Diagnosis not present

## 2021-02-12 DIAGNOSIS — G894 Chronic pain syndrome: Secondary | ICD-10-CM | POA: Diagnosis not present

## 2021-02-12 DIAGNOSIS — L0390 Cellulitis, unspecified: Secondary | ICD-10-CM | POA: Diagnosis not present

## 2021-02-12 DIAGNOSIS — B9620 Unspecified Escherichia coli [E. coli] as the cause of diseases classified elsewhere: Secondary | ICD-10-CM | POA: Diagnosis present

## 2021-02-12 DIAGNOSIS — L03116 Cellulitis of left lower limb: Secondary | ICD-10-CM | POA: Diagnosis not present

## 2021-02-12 DIAGNOSIS — S8011XD Contusion of right lower leg, subsequent encounter: Secondary | ICD-10-CM | POA: Diagnosis not present

## 2021-02-12 DIAGNOSIS — I609 Nontraumatic subarachnoid hemorrhage, unspecified: Secondary | ICD-10-CM | POA: Diagnosis not present

## 2021-02-12 DIAGNOSIS — L97919 Non-pressure chronic ulcer of unspecified part of right lower leg with unspecified severity: Secondary | ICD-10-CM | POA: Diagnosis not present

## 2021-02-12 DIAGNOSIS — E876 Hypokalemia: Secondary | ICD-10-CM | POA: Diagnosis present

## 2021-02-12 DIAGNOSIS — G4733 Obstructive sleep apnea (adult) (pediatric): Secondary | ICD-10-CM | POA: Diagnosis present

## 2021-02-12 DIAGNOSIS — L03115 Cellulitis of right lower limb: Secondary | ICD-10-CM | POA: Diagnosis not present

## 2021-02-12 DIAGNOSIS — N3000 Acute cystitis without hematuria: Secondary | ICD-10-CM | POA: Diagnosis present

## 2021-02-12 DIAGNOSIS — Z7401 Bed confinement status: Secondary | ICD-10-CM | POA: Diagnosis not present

## 2021-02-12 DIAGNOSIS — M79604 Pain in right leg: Secondary | ICD-10-CM | POA: Diagnosis not present

## 2021-02-12 DIAGNOSIS — I10 Essential (primary) hypertension: Secondary | ICD-10-CM | POA: Diagnosis present

## 2021-02-12 DIAGNOSIS — G9340 Encephalopathy, unspecified: Secondary | ICD-10-CM | POA: Diagnosis not present

## 2021-02-12 DIAGNOSIS — J45909 Unspecified asthma, uncomplicated: Secondary | ICD-10-CM | POA: Diagnosis not present

## 2021-02-12 DIAGNOSIS — D649 Anemia, unspecified: Secondary | ICD-10-CM | POA: Diagnosis present

## 2021-02-12 DIAGNOSIS — G9341 Metabolic encephalopathy: Secondary | ICD-10-CM | POA: Diagnosis present

## 2021-02-12 DIAGNOSIS — R600 Localized edema: Secondary | ICD-10-CM | POA: Diagnosis present

## 2021-02-12 DIAGNOSIS — R159 Full incontinence of feces: Secondary | ICD-10-CM | POA: Diagnosis present

## 2021-02-12 DIAGNOSIS — M1990 Unspecified osteoarthritis, unspecified site: Secondary | ICD-10-CM | POA: Diagnosis present

## 2021-02-12 DIAGNOSIS — K219 Gastro-esophageal reflux disease without esophagitis: Secondary | ICD-10-CM | POA: Diagnosis present

## 2021-02-12 DIAGNOSIS — E119 Type 2 diabetes mellitus without complications: Secondary | ICD-10-CM | POA: Diagnosis not present

## 2021-02-12 LAB — CULTURE, BLOOD (ROUTINE X 2)
Culture: NO GROWTH
Culture: NO GROWTH
Special Requests: ADEQUATE
Special Requests: ADEQUATE

## 2021-02-12 LAB — RESP PANEL BY RT-PCR (FLU A&B, COVID) ARPGX2
Influenza A by PCR: NEGATIVE
Influenza B by PCR: NEGATIVE
SARS Coronavirus 2 by RT PCR: NEGATIVE

## 2021-02-12 MED ORDER — CEPHALEXIN 500 MG PO CAPS: 500.0000 mg | ORAL_CAPSULE | Freq: Three times a day (TID) | ORAL | 0 refills | Status: AC

## 2021-02-12 MED ORDER — POTASSIUM CHLORIDE CRYS ER 20 MEQ PO TBCR: 20.0000 meq | EXTENDED_RELEASE_TABLET | Freq: Every day | ORAL | 0 refills | Status: AC

## 2021-02-12 NOTE — Discharge Summary (Signed)
Physician Discharge Summary  [name]:[id number] DOB: Dec 01, 1950 DOA: 02/07/2021  PCP: [name], MD  Admit date: 02/07/2021 Discharge date: 02/12/2021  Admitted From: Home  Disposition:   Recommendations for Outpatient Follow-up:  Follow up with PCP in 1-2 weeks Please obtain BMP/CBC in one week Needs referral to Lymphedema clinic, Dermatologist.     Discharge Condition: Stable.  CODE STATUS:Full Code Diet recommendation: Heart Healthy   Brief/Interim Summary: 70 year old with past medical history significant for chronic lymphedema lower extremity, OSA, seizure disorder, acute renal failure, ileus presented to the ED on 02/07/2021 with right lower extremity swelling pain redness and several days of gradually worsening.  Patient was treated with IV antibiotics for superimposed cellulitis and lymphedema.  CT lower extremity was negative for abscess.  Doppler negative for DVT.  1-Lower extremity cellulitis right, on chronic lymphedema: -Was treated with IV antibiotics, antibiotic changed to Keflex on 10/4.  Plan to complete 2 more days. -Doppler negative for DVT -CT; findings and subcutaneous edema, more confluent region especially along the mid calf may represent subcutaneous hematoma or more confluent edema, this is infiltrated with subcutaneous adipose tissue and is not characteristic of malignancy or abscess. -Needs  to lymphedema clinic and Ortho rheumatologist.  2-OSA: Continue with CPAP nightly Seizure disorder: Continue with phenobarbital AKA, hypokalemia hyponatremia resolved Resume  torsemide and potassium supplement at discharge.  Will need close follow-up for renal function. Normocytic anemia: No signs of overt bleeding  Conditioning: Patient will benefit from rehab.     Discharge Diagnoses:  Principal Problem:   Cellulitis and abscess of foot Active Problems:   Seizure disorder (HCC)   OSA on CPAP   Lymphedema of right lower extremity    Hypokalemia   Cellulitis   Acute renal failure (ARF) (HCC)    Discharge Instructions  Discharge Instructions     Diet - low sodium heart healthy   Complete by: As directed    Discharge wound care:   Complete by: As directed    See above   Increase activity slowly   Complete by: As directed       Allergies as of 02/12/2021       Reactions   Compazine Shortness Of Breath   Prochlorperazine Maleate Shortness Of Breath   Topamax [topiramate] Hives, Rash   Codeine Sulfate Nausea Only   Other    NO MRI'S   Seasonal Ic [cholestatin] Hives   Adhesive [tape] Rash   Iodinated Diagnostic Agents Rash   Uncoded Allergy. Allergen: contrast dyes        Medication List     STOP taking these medications    amLODipine 5 MG tablet Commonly known as: NORVASC   Biotin 5000 MCG Caps   doxycycline 100 MG tablet Commonly known as: VIBRA-TABS   doxycycline 40 MG capsule Commonly known as: ORACEA   fluconazole 150 MG tablet Commonly known as: DIFLUCAN   nabumetone 750 MG tablet Commonly known as: RELAFEN   ondansetron 4 MG tablet Commonly known as: ZOFRAN   spironolactone 50 MG tablet Commonly known as: ALDACTONE   traMADol 50 MG tablet Commonly known as: ULTRAM   valsartan 80 MG tablet Commonly known as: DIOVAN       TAKE these medications    acetaminophen 325 MG tablet Commonly known as: TYLENOL Take 2 tablets (650 mg total) by mouth every 6 (six) hours as needed for mild pain or headache.   albuterol 108 (90 Base) MCG/ACT inhaler Commonly known as: VENTOLIN HFA Inhale 2 puffs  into the lungs every 4 (four) hours as needed for wheezing.   aspirin EC 81 MG tablet Take 1 tablet (81 mg total) by mouth 2 (two) times daily.   CALCIUM 600+D PO Take 1 tablet by mouth daily.   cephALEXin 500 MG capsule Commonly known as: KEFLEX Take 1 capsule (500 mg total) by mouth every 8 (eight) hours for 2 days.   cholecalciferol 1000 units tablet Commonly known as:  VITAMIN D Take 3,000 Units by mouth daily.   Dermacloud Oint Apply 1 application topically in the morning and at bedtime. After each incontinent episode.   ferrous sulfate 325 (65 FE) MG tablet Take 650 mg by mouth daily with breakfast.   fluticasone 50 MCG/ACT nasal spray Commonly known as: FLONASE Place 2 sprays into the nose 2 (two) times daily.   gabapentin 300 MG capsule Commonly known as: NEURONTIN Take 300 mg by mouth 3 (three) times daily. What changed: Another medication with the same name was removed. Continue taking this medication, and follow the directions you see here.   hydrOXYzine 25 MG tablet Commonly known as: ATARAX/VISTARIL Take 1 tablet (25 mg total) by mouth daily as needed for itching or anxiety.   levocetirizine 5 MG tablet Commonly known as: XYZAL Take 5 mg by mouth every evening.   magnesium oxide 400 MG tablet Commonly known as: MAG-OX Take 2 tablets (800 mg total) by mouth at bedtime.   metolazone 5 MG tablet Commonly known as: ZAROXOLYN 5 mg 3x weekly PRN fluid/excessive volume   montelukast 10 MG tablet Commonly known as: SINGULAIR Take 10 mg by mouth at bedtime.   nystatin powder Commonly known as: MYCOSTATIN/NYSTOP Apply 1 application topically 2 (two) times daily. Underneath breast and groin   pantoprazole 40 MG tablet Commonly known as: PROTONIX Take 40 mg by mouth daily.   PHENobarbital 64.8 MG tablet Commonly known as: LUMINAL Take 2 tablets (129.6 mg total) by mouth at bedtime.   potassium chloride SA 20 MEQ tablet Commonly known as: KLOR-CON Take 1 tablet (20 mEq total) by mouth daily. What changed: how much to take   Pramipexole Dihydrochloride 0.75 MG Tb24 Take 1 tablet (0.75 mg total) by mouth at bedtime.   pramipexole 0.25 MG tablet Commonly known as: MIRAPEX TAKE 1-2 TABS AT 6 PM FOR RESTLESS LEG SYNDROME   rosuvastatin 20 MG tablet Commonly known as: CRESTOR TAKE 1 TABLET (20 MG TOTAL) BY MOUTH DAILY. NEEDS  APPOINTMENT FOR FUTURE REFILLS What changed: additional instructions   thiamine 100 MG tablet Take 1 tablet (100 mg total) by mouth daily.   torsemide 20 MG tablet Commonly known as: DEMADEX Take 20 mg by mouth daily.   zinc gluconate 50 MG tablet Take 50 mg by mouth daily.               Discharge Care Instructions  (From admission, onward)           Start     Ordered   02/12/21 0000  Discharge wound care:       Comments: See above   02/12/21 1131            Allergies  Allergen Reactions   Compazine Shortness Of Breath   Prochlorperazine Maleate Shortness Of Breath   Topamax [Topiramate] Hives and Rash   Codeine Sulfate Nausea Only   Other     NO MRI'S   Seasonal Ic [Cholestatin] Hives   Adhesive [Tape] Rash   Iodinated Diagnostic Agents Rash    Uncoded Allergy.  Allergen: contrast dyes    Consultations: None   Procedures/Studies: CT ABDOMEN PELVIS WO CONTRAST  Result Date: 01/27/2021 CLINICAL DATA:  Abdominal pain, acute, nonlocalized EXAM: CT ABDOMEN AND PELVIS WITHOUT CONTRAST TECHNIQUE: Multidetector CT imaging of the abdomen and pelvis was performed following the standard protocol without IV contrast. COMPARISON:  None. FINDINGS: Lower chest: The visualized lung bases are clear bilaterally. The visualized heart and pericardium are unremarkable. Hepatobiliary: The visualized liver is unremarkable. Gallbladder unremarkable. No intra or extrahepatic biliary ductal dilation. Pancreas: Unremarkable Spleen: Unremarkable Adrenals/Urinary Tract: No adrenal hemorrhage or renal injury identified. Bladder is unremarkable. Stomach/Bowel: There are several prominent gas and fluid-filled loops of large and small bowel without a focal point of transition identified most in keeping with an adynamic ileus. Nasogastric tube tip is seen within the distal body of the stomach. No superimposed evidence of focal inflammation. Trace free intraperitoneal fluid. No free  intraperitoneal gas. The appendix is unremarkable. Vascular/Lymphatic: Mild aortoiliac atherosclerotic calcification. No aortic aneurysm. No pathologic adenopathy within the abdomen and pelvis. Reproductive: Status post hysterectomy. No adnexal masses. Other: No abdominal wall hernia.  Rectum unremarkable. Musculoskeletal: Advanced degenerative changes are noted within the right hip. Moderate degenerative changes noted within the left hip. Mild degenerative changes noted within the lumbar spine. No acute bone abnormality. IMPRESSION: Findings most in keeping with an adynamic ileus. Nasogastric tube tip within the distal body of the stomach. Mild ascites, nonspecific. Severe asymmetric right hip degenerative arthritis. Aortic Atherosclerosis (ICD10-I70.0). Electronically Signed   By: Helyn Numbers M.D.   On: 01/27/2021 02:31   DG Chest 2 View  Result Date: 01/18/2021 CLINICAL DATA:  Hypoxia EXAM: CHEST - 2 VIEW COMPARISON:  01/08/2021 FINDINGS: Low lung volumes. Cardiomegaly with central vascular crowding. Aortic atherosclerosis. No pneumothorax. IMPRESSION: Hypoventilatory changes with cardiomegaly and vascular crowding. Electronically Signed   By: Jasmine Pang M.D.   On: 01/18/2021 23:03   CT Tibia Fibula Right Wo Contrast  Result Date: 02/07/2021 CLINICAL DATA:  Soft tissue mass/swelling EXAM: CT OF THE LOWER RIGHT EXTREMITY WITHOUT CONTRAST TECHNIQUE: Multidetector CT imaging of the right lower extremity was performed according to the standard protocol. COMPARISON:  Ultrasound 02/07/2021 FINDINGS: Bones/Joint/Cartilage Severe osteoarthritis of the knee. The distal third of the fibula is absent. Plate and screw fixator the distal tibia is incorporated into the bone and surrounded by bone distally. There is substantial deformity tibiotalar fusion. Plantar calcaneal spur. No knee joint effusion. The distal femur sits somewhat anteriorly on the tibial plateau, cannot exclude ACL insufficiency. Ligaments  Suboptimally assessed by CT. Muscles and Tendons Muscular atrophy. Mildly thickened tibialis posterior tendon distally is partially surrounded by bone. Type 2 accessory navicular. Soft tissues Substantial cutaneous thickening posteriorly and medially in the proximal calf, extending circumferentially in the distal calf and along the ankle, and tracking into the dorsum of the foot. This is notably asymmetric compared to the visualized portion of the contralateral side. There is likewise substantial abnormal infiltration of the subcutaneous tissues posterolaterally along the proximal calf, and circumferentially in the distal calf. Medially the level of the mid calf, there is confluent but still infiltrative density in the subcutaneous tissues, for example measuring about 8.1 by 5.4 by 8.1 cm on image 120 series 5 and image 54 series 7. This could be residua of a hematoma given the mixed density, and given the infiltrative adipose tissue I am skeptical that this represents an abscess or a mass. There other areas of similar although less striking confluence laterally in the calf  including a proximal lesion measuring about 3.6 cm on image 42 and a mid calf lateral lesion measuring 3.9 cm craniocaudad on image 47 series 7. These also seem to appear to be areas of more confluent infiltration rather than abscess or masslike appearance. IMPRESSION: 1. Substantial cutaneous and subcutaneous edema in the right calf from lymphedema or similar process, with more confluent regions especially medially along the mid calf but also in a couple of locations in the lateral calf which could represent residua from prior subcutaneous hematoma (is the patient anticoagulated)) or simply more confluent edema. This is infiltrated with subcutaneous adipose tissue and is not characteristic of malignancy or abscess. 2. Severe osteoarthritis, right knee. 3. Resected distal fibula.  Tibiotalar fusion. Electronically Signed   By: Gaylyn Rong  M.D.   On: 02/07/2021 17:59   US Venous Img Lower Right (DVT Study)  Result Date: 02/07/2021 CLINICAL DATA:  Right leg swelling.  Palpable lump. EXAM: RIGHT LOWER EXTREMITY VENOUS DOPPLER ULTRASOUND TECHNIQUE: Gray-scale sonography with graded compression, as well as color Doppler and duplex ultrasound were performed to evaluate the lower extremity deep venous systems from the level of the common femoral vein and including the common femoral, femoral, profunda femoral, popliteal and calf veins including the posterior tibial, peroneal and gastrocnemius veins when visible. The superficial great saphenous vein was also interrogated. Spectral Doppler was utilized to evaluate flow at rest and with distal augmentation maneuvers in the common femoral, femoral and popliteal veins. COMPARISON:  None. FINDINGS: Contralateral Common Femoral Vein: Respiratory phasicity is normal and symmetric with the symptomatic side. No evidence of thrombus. Normal compressibility. Common Femoral Vein: No evidence of thrombus. Normal compressibility, respiratory phasicity and response to augmentation. Saphenofemoral Junction: No evidence of thrombus. Normal compressibility and flow on color Doppler imaging. Profunda Femoral Vein: No evidence of thrombus. Normal compressibility and flow on color Doppler imaging. Femoral Vein: No evidence of thrombus. Normal compressibility, respiratory phasicity and response to augmentation. Popliteal Vein: Limited evaluation. Unable to perform compression. Normal color Doppler flow in the right popliteal vein. Calf Veins: Visualized right deep calf veins are patent without thrombus. Limited evaluation Superficial Great Saphenous Vein: No evidence of thrombus. Normal compressibility. Other Findings: Area of concern along the anterior calf was evaluated. There is poorly defined heterogeneous soft tissue in this area. IMPRESSION: 1. Negative for deep venous thrombosis in right lower extremity. Technically  challenging examination with limited evaluation of the right popliteal vein and right deep calf veins as described. 2. Palpable lump in the anterior right calf. There is poorly defined heterogeneous soft tissue in this area. This area is poorly characterized with ultrasound. Electronically Signed   By: Richarda Overlie M.D.   On: 02/07/2021 15:57   DG Abd 2 Views  Result Date: 01/26/2021 CLINICAL DATA:  Generalized abdominal pain and distension. EXAM: ABDOMEN - 2 VIEW COMPARISON:  None. FINDINGS: Small bowel loops are diffusely air-filled and dilated. There is also gaseous distention of the colon. Some colonic loops appear dilated as well. No suspicious calcifications are seen. There is some reticular opacities in lung bases. IMPRESSION: 1. Diffusely dilated small and large bowel. Findings are concerning for severe ileus. A bowel obstruction cannot be excluded. Electronically Signed   By: Darliss Cheney M.D.   On: 01/26/2021 15:48   DG Abd Portable 1V  Result Date: 02/08/2021 CLINICAL DATA:  Encounter for nausea and vomiting EXAM: PORTABLE ABDOMEN - 1 VIEW COMPARISON:  01/27/2021 FINDINGS: Gaseous distension of ascending and descending colonic segments to a  mild degree. Nearly normal bowel gas pattern. Lobulated luminal contour at the ascending colon not clearly changed from CT of the abdomen 01/27/2021. No concerning mass effect or calcification. Grossly clear lung bases. Pattern. IMPRESSION: Significantly improved bowel gas pattern since CT 01/27/2021. No new abnormality. Electronically Signed   By: Tiburcio Pea M.D.   On: 02/08/2021 05:04   DG Abd Portable 1V  Result Date: 01/26/2021 CLINICAL DATA:  Nasogastric tube placement EXAM: PORTABLE ABDOMEN - 1 VIEW COMPARISON:  None. FINDINGS: Esophageal catheter tip and side port are in the distal stomach. Incompletely visualized dilated loops of bowel in the upper abdomen. IMPRESSION: Esophageal catheter tip and side port in the distal stomach. Electronically  Signed   By: Deatra Robinson M.D.   On: 01/26/2021 20:34   EEG adult  Result Date: 01/22/2021 Charlsie Quest, MD     01/22/2021  2:26 PM Patient Name: CLARITY CISZEK MRN: [id number] Epilepsy Attending: Charlsie Quest Referring Physician/Provider: Dr Lynden Oxford Date: 01/22/2021 Duration: 26.35 mins Patient history: 70 year old female with altered mental status.  EEG to evaluate for seizures. Level of alertness: Awake AEDs during EEG study: Phenobarbital Technical aspects: This EEG study was done with scalp electrodes positioned according to the 10-20 International system of electrode placement. Electrical activity was acquired at a sampling rate of 500Hz  and reviewed with a high frequency filter of 70Hz  and a low frequency filter of 1Hz . EEG data were recorded continuously and digitally stored. Description: No clear posterior dominant rhythm was seen.  EEG showed near continuous generalized 5 to 7 Hz theta as well as intermittent generalized 2 to 3 Hz delta slowing.  Hyperventilation and photic stimulation were not performed.   ABNORMALITY - Continuous slow, generalized IMPRESSION: This study is suggestive of moderate diffuse encephalopathy, nonspecific etiology. No seizures or epileptiform discharges were seen throughout the recording. Priyanka     Subjective: She is alert, report leg pain.    Discharge Exam: Vitals:   02/12/21 0717 02/12/21 1058  BP: (!) 124/54 (!) 127/56  Pulse: 76 70  Resp: 14 15  Temp: 99.6 F (37.6 C) 98.8 F (37.1 C)  SpO2: 95% 94%     General: Pt is alert, awake, not in acute distress Cardiovascular: RRR, S1/S2 +, no rubs, no gallops Respiratory: CTA bilaterally, no wheezing, no rhonchi Abdominal: Soft, NT, ND, bowel sounds + Extremities: Right LE with chronic lymphedema charges, swollen area, tender, mild redness.     The results of significant diagnostics from this hospitalization (including imaging, microbiology, ancillary and laboratory) are  listed below for reference.     Microbiology: Recent Results (from the past 240 hour(s))  Blood culture (routine x 2)     Status: None   Collection Time: 02/07/21  2:43 PM   Specimen: BLOOD LEFT HAND  Result Value Ref Range Status   Specimen Description BLOOD LEFT HAND  Final   Special Requests   Final    BOTTLES DRAWN AEROBIC AND ANAEROBIC Blood Culture adequate volume   Culture   Final    NO GROWTH 5 DAYS Performed at [facility], [address] [place], [facility] [address]    Report Status 02/12/2021 FINAL  Final  Blood culture (routine x 2)     Status: None   Collection Time: 02/07/21  2:43 PM   Specimen: BLOOD  Result Value Ref Range Status   Specimen Description   Final    BLOOD LEFT ANTECUBITAL Performed at [facility], [phone]  [address] [place], [place] [address]    Special Requests   Final    BOTTLES DRAWN AEROBIC ONLY Blood Culture adequate volume   Culture   Final    NO GROWTH 5 DAYS Performed at [facility], [address] [place], [place] [address]    Report Status 02/12/2021 FINAL  Final  Resp Panel by RT-PCR (Flu A&B, Covid) Nasopharyngeal Swab     Status: None   Collection Time: 02/07/21  4:12 PM   Specimen: Nasopharyngeal Swab; Nasopharyngeal(NP) swabs in vial transport medium  Result Value Ref Range Status   SARS Coronavirus 2 by RT PCR NEGATIVE NEGATIVE Final    Comment: (NOTE) SARS-CoV-2 target nucleic acids are NOT DETECTED.  The SARS-CoV-2 RNA is generally detectable in upper respiratory specimens during the acute phase of infection. The lowest concentration of SARS-CoV-2 viral copies this assay can detect is 138 copies/mL. A negative result does not preclude SARS-Cov-2 infection and should not be used as the sole basis for treatment or other patient management decisions. A negative result may occur with  improper specimen collection/handling, submission of specimen other than nasopharyngeal swab, presence of viral  mutation(s) within the areas targeted by this assay, and inadequate number of viral copies(<138 copies/mL). A negative result must be combined with clinical observations, patient history, and epidemiological information. The expected result is Negative.  Fact Sheet for Patients:  BloggerCourse.com  Fact Sheet for Healthcare Providers:  SeriousBroker.it  This test is no t yet approved or cleared by the [place] FDA and  has been authorized for detection and/or diagnosis of SARS-CoV-2 by FDA under an Emergency Use Authorization (EUA). This EUA will remain  in effect (meaning this test can be used) for the duration of the COVID-19 declaration under Section 564(b)(1) of the Act, 21 U.S.C.section 360bbb-3(b)(1), unless the authorization is terminated  or revoked sooner.       Influenza A by PCR NEGATIVE NEGATIVE Final   Influenza B by PCR NEGATIVE NEGATIVE Final    Comment: (NOTE) The Xpert Xpress SARS-CoV-2/FLU/RSV plus assay is intended as an aid in the diagnosis of influenza from Nasopharyngeal swab specimens and should not be used as a sole basis for treatment. Nasal washings and aspirates are unacceptable for Xpert Xpress SARS-CoV-2/FLU/RSV testing.  Fact Sheet for Patients: BloggerCourse.com  Fact Sheet for Healthcare Providers: SeriousBroker.it  This test is not yet approved or cleared by the [place] FDA and has been authorized for detection and/or diagnosis of SARS-CoV-2 by FDA under an Emergency Use Authorization (EUA). This EUA will remain in effect (meaning this test can be used) for the duration of the COVID-19 declaration under Section 564(b)(1) of the Act, 21 U.S.C. section 360bbb-3(b)(1), unless the authorization is terminated or revoked.  Performed at [facility], [address], [place], [place] [address]   C Difficile Quick  Screen w PCR reflex     Status: None   Collection Time: 02/08/21  8:55 PM   Specimen: STOOL  Result Value Ref Range Status   C Diff antigen NEGATIVE NEGATIVE Final   C Diff toxin NEGATIVE NEGATIVE Final   C Diff interpretation No C. difficile detected.  Final    Comment: Performed at [facility], [address], [place], [place] [address]  MRSA Next Gen by PCR, Nasal     Status: None   Collection Time: 02/09/21  9:30 PM   Specimen: Nasal Mucosa; Nasal Swab  Result Value Ref Range Status   MRSA by PCR  Next Gen NOT DETECTED NOT DETECTED Final    Comment: (NOTE) The GeneXpert MRSA Assay (FDA approved for NASAL specimens only), is one component of a comprehensive MRSA colonization surveillance program. It is not intended to diagnose MRSA infection nor to guide or monitor treatment for MRSA infections. Test performance is not FDA approved in patients less than 14 years old. Performed at [facility], [address], [place], [place] [address]   Resp Panel by RT-PCR (Flu A&B, Covid) Nasopharyngeal Swab     Status: None   Collection Time: 02/12/21 10:01 AM   Specimen: Nasopharyngeal Swab; Nasopharyngeal(NP) swabs in vial transport medium  Result Value Ref Range Status   SARS Coronavirus 2 by RT PCR NEGATIVE NEGATIVE Final    Comment: (NOTE) SARS-CoV-2 target nucleic acids are NOT DETECTED.  The SARS-CoV-2 RNA is generally detectable in upper respiratory specimens during the acute phase of infection. The lowest concentration of SARS-CoV-2 viral copies this assay can detect is 138 copies/mL. A negative result does not preclude SARS-Cov-2 infection and should not be used as the sole basis for treatment or other patient management decisions. A negative result may occur with  improper specimen collection/handling, submission of specimen other than nasopharyngeal swab, presence of viral mutation(s) within the areas targeted by this assay, and inadequate number of  viral copies(<138 copies/mL). A negative result must be combined with clinical observations, patient history, and epidemiological information. The expected result is Negative.  Fact Sheet for Patients:  BloggerCourse.com  Fact Sheet for Healthcare Providers:  SeriousBroker.it  This test is no t yet approved or cleared by the [place] FDA and  has been authorized for detection and/or diagnosis of SARS-CoV-2 by FDA under an Emergency Use Authorization (EUA). This EUA will remain  in effect (meaning this test can be used) for the duration of the COVID-19 declaration under Section 564(b)(1) of the Act, 21 U.S.C.section 360bbb-3(b)(1), unless the authorization is terminated  or revoked sooner.       Influenza A by PCR NEGATIVE NEGATIVE Final   Influenza B by PCR NEGATIVE NEGATIVE Final    Comment: (NOTE) The Xpert Xpress SARS-CoV-2/FLU/RSV plus assay is intended as an aid in the diagnosis of influenza from Nasopharyngeal swab specimens and should not be used as a sole basis for treatment. Nasal washings and aspirates are unacceptable for Xpert Xpress SARS-CoV-2/FLU/RSV testing.  Fact Sheet for Patients: BloggerCourse.com  Fact Sheet for Healthcare Providers: SeriousBroker.it  This test is not yet approved or cleared by the [place] FDA and has been authorized for detection and/or diagnosis of SARS-CoV-2 by FDA under an Emergency Use Authorization (EUA). This EUA will remain in effect (meaning this test can be used) for the duration of the COVID-19 declaration under Section 564(b)(1) of the Act, 21 U.S.C. section 360bbb-3(b)(1), unless the authorization is terminated or revoked.  Performed at [facility], [address], [place], [place] [address]      Labs: BNP (last 3 results) No results for input(s): BNP in the last 8760 hours. Basic Metabolic  Panel: Recent Labs  Lab 02/07/21 1431 02/08/21 0055 02/08/21 1230 02/09/21 0747 02/10/21 0032 02/11/21 0044  NA 135 132*  --  137 135 135  K 2.7* 3.1*  --  3.3* 3.3* 4.2  CL 92* 92*  --  100 99 101  CO2 30 29  --  GLUCOSE 111* 108*  --  92 93 96  BUN 19 17  --  10 7* 9  CREATININE 1.31* 1.39*  --  0.74 0.70 0.70  CALCIUM 9.6 8.8*  --  8.5* 8.8* 8.9  MG  --   --  1.3*  --  1.7  --    Liver Function Tests: Recent Labs  Lab 02/07/21 1431  AST 39  ALT 44  ALKPHOS 93  BILITOT 0.4  PROT 7.6  ALBUMIN 3.8   No results for input(s): LIPASE, AMYLASE in the last 168 hours. No results for input(s): AMMONIA in the last 168 hours. CBC: Recent Labs  Lab 02/07/21 1431 02/08/21 0055 02/09/21 0747 02/10/21 0032  WBC 10.5 9.8 8.3 8.9  NEUTROABS 7.6  --   --   --   HGB 11.5* 11.1* 9.8* 10.3*  HCT 33.2* 32.0* 29.0* 30.8*  MCV 89.5 90.9 92.4 93.1  PLT 335 344 306 313   Cardiac Enzymes: Recent Labs  Lab 02/07/21 1431  CKTOTAL 146   BNP: Invalid input(s): POCBNP CBG: No results for input(s): GLUCAP in the last 168 hours. D-Dimer No results for input(s): DDIMER in the last 72 hours. Hgb A1c No results for input(s): HGBA1C in the last 72 hours. Lipid Profile No results for input(s): CHOL, HDL, LDLCALC, TRIG, CHOLHDL, LDLDIRECT in the last 72 hours. Thyroid function studies No results for input(s): TSH, T4TOTAL, T3FREE, THYROIDAB in the last 72 hours.  Invalid input(s): FREET3 Anemia work up No results for input(s): VITAMINB12, FOLATE, FERRITIN, TIBC, IRON, RETICCTPCT in the last 72 hours. Urinalysis    Component Value Date/Time   COLORURINE YELLOW 01/19/2021 0000   APPEARANCEUR CLEAR 01/19/2021 0000   LABSPEC 1.015 01/19/2021 0000   PHURINE 5.5 01/19/2021 0000   GLUCOSEU NEGATIVE 01/19/2021 0000   HGBUR NEGATIVE 01/19/2021 0000   BILIRUBINUR NEGATIVE 01/19/2021 0000   KETONESUR NEGATIVE 01/19/2021 0000   PROTEINUR NEGATIVE 01/19/2021 0000   NITRITE  NEGATIVE 01/19/2021 0000   LEUKOCYTESUR NEGATIVE 01/19/2021 0000   Sepsis Labs Invalid input(s): PROCALCITONIN,  WBC,  LACTICIDVEN Microbiology Recent Results (from the past 240 hour(s))  Blood culture (routine x 2)     Status: None   Collection Time: 02/07/21  2:43 PM   Specimen: BLOOD LEFT HAND  Result Value Ref Range Status   Specimen Description BLOOD LEFT HAND  Final   Special Requests   Final    BOTTLES DRAWN AEROBIC AND ANAEROBIC Blood Culture adequate volume   Culture   Final    NO GROWTH 5 DAYS Performed at [facility], [address], [place], [place] [address]    Report Status 02/12/2021 FINAL  Final  Blood culture (routine x 2)     Status: None   Collection Time: 02/07/21  2:43 PM   Specimen: BLOOD  Result Value Ref Range Status   Specimen Description   Final    BLOOD LEFT ANTECUBITAL Performed at [facility], [address] [place], [place] [address]    Special Requests   Final    BOTTLES DRAWN AEROBIC ONLY Blood Culture adequate volume   Culture   Final    NO GROWTH 5 DAYS Performed at [facility], [address] [place], [place] [address]    Report Status 02/12/2021 FINAL  Final  Resp Panel by RT-PCR (Flu A&B, Covid) Nasopharyngeal Swab     Status: None   Collection Time: 02/07/21  4:12 PM   Specimen: Nasopharyngeal Swab; Nasopharyngeal(NP) swabs in vial transport medium  Result Value Ref Range Status   SARS Coronavirus 2 by RT PCR NEGATIVE NEGATIVE Final  Comment: (NOTE) SARS-CoV-2 target nucleic acids are NOT DETECTED.  The SARS-CoV-2 RNA is generally detectable in upper respiratory specimens during the acute phase of infection. The lowest concentration of SARS-CoV-2 viral copies this assay can detect is 138 copies/mL. A negative result does not preclude SARS-Cov-2 infection and should not be used as the sole basis for treatment or other patient management decisions. A negative result may occur with  improper  specimen collection/handling, submission of specimen other than nasopharyngeal swab, presence of viral mutation(s) within the areas targeted by this assay, and inadequate number of viral copies(<138 copies/mL). A negative result must be combined with clinical observations, patient history, and epidemiological information. The expected result is Negative.  Fact Sheet for Patients:  BloggerCourse.com  Fact Sheet for Healthcare Providers:  SeriousBroker.it  This test is no t yet approved or cleared by the [place] FDA and  has been authorized for detection and/or diagnosis of SARS-CoV-2 by FDA under an Emergency Use Authorization (EUA). This EUA will remain  in effect (meaning this test can be used) for the duration of the COVID-19 declaration under Section 564(b)(1) of the Act, 21 U.S.C.section 360bbb-3(b)(1), unless the authorization is terminated  or revoked sooner.       Influenza A by PCR NEGATIVE NEGATIVE Final   Influenza B by PCR NEGATIVE NEGATIVE Final    Comment: (NOTE) The Xpert Xpress SARS-CoV-2/FLU/RSV plus assay is intended as an aid in the diagnosis of influenza from Nasopharyngeal swab specimens and should not be used as a sole basis for treatment. Nasal washings and aspirates are unacceptable for Xpert Xpress SARS-CoV-2/FLU/RSV testing.  Fact Sheet for Patients: BloggerCourse.com  Fact Sheet for Healthcare Providers: SeriousBroker.it  This test is not yet approved or cleared by the [place] FDA and has been authorized for detection and/or diagnosis of SARS-CoV-2 by FDA under an Emergency Use Authorization (EUA). This EUA will remain in effect (meaning this test can be used) for the duration of the COVID-19 declaration under Section 564(b)(1) of the Act, 21 U.S.C. section 360bbb-3(b)(1), unless the authorization is terminated or revoked.  Performed at  [facility], [address] [place], [place] [address]   C Difficile Quick Screen w PCR reflex     Status: None   Collection Time: 02/08/21  8:55 PM   Specimen: STOOL  Result Value Ref Range Status   C Diff antigen NEGATIVE NEGATIVE Final   C Diff toxin NEGATIVE NEGATIVE Final   C Diff interpretation No C. difficile detected.  Final    Comment: Performed at [facility], [address] [place], [place] [address]  MRSA Next Gen by PCR, Nasal     Status: None   Collection Time: 02/09/21  9:30 PM   Specimen: Nasal Mucosa; Nasal Swab  Result Value Ref Range Status   MRSA by PCR Next Gen NOT DETECTED NOT DETECTED Final    Comment: (NOTE) The GeneXpert MRSA Assay (FDA approved for NASAL specimens only), is one component of a comprehensive MRSA colonization surveillance program. It is not intended to diagnose MRSA infection nor to guide or monitor treatment for MRSA infections. Test performance is not FDA approved in patients less than 25 years old. Performed at [facility], [address] [place], [place] [address]   Resp Panel by RT-PCR (Flu A&B, Covid) Nasopharyngeal Swab     Status: None   Collection Time: 02/12/21 10:01 AM   Specimen: Nasopharyngeal Swab; Nasopharyngeal(NP) swabs in vial transport medium  Result Value Ref  Range Status   SARS Coronavirus 2 by RT PCR NEGATIVE NEGATIVE Final    Comment: (NOTE) SARS-CoV-2 target nucleic acids are NOT DETECTED.  The SARS-CoV-2 RNA is generally detectable in upper respiratory specimens during the acute phase of infection. The lowest concentration of SARS-CoV-2 viral copies this assay can detect is 138 copies/mL. A negative result does not preclude SARS-Cov-2 infection and should not be used as the sole basis for treatment or other patient management decisions. A negative result may occur with  improper specimen collection/handling, submission of specimen other than nasopharyngeal swab, presence of  viral mutation(s) within the areas targeted by this assay, and inadequate number of viral copies(<138 copies/mL). A negative result must be combined with clinical observations, patient history, and epidemiological information. The expected result is Negative.  Fact Sheet for Patients:  BloggerCourse.com  Fact Sheet for Healthcare Providers:  SeriousBroker.it  This test is no t yet approved or cleared by the [place] FDA and  has been authorized for detection and/or diagnosis of SARS-CoV-2 by FDA under an Emergency Use Authorization (EUA). This EUA will remain  in effect (meaning this test can be used) for the duration of the COVID-19 declaration under Section 564(b)(1) of the Act, 21 U.S.C.section 360bbb-3(b)(1), unless the authorization is terminated  or revoked sooner.       Influenza A by PCR NEGATIVE NEGATIVE Final   Influenza B by PCR NEGATIVE NEGATIVE Final    Comment: (NOTE) The Xpert Xpress SARS-CoV-2/FLU/RSV plus assay is intended as an aid in the diagnosis of influenza from Nasopharyngeal swab specimens and should not be used as a sole basis for treatment. Nasal washings and aspirates are unacceptable for Xpert Xpress SARS-CoV-2/FLU/RSV testing.  Fact Sheet for Patients: BloggerCourse.com  Fact Sheet for Healthcare Providers: SeriousBroker.it  This test is not yet approved or cleared by the [place] FDA and has been authorized for detection and/or diagnosis of SARS-CoV-2 by FDA under an Emergency Use Authorization (EUA). This EUA will remain in effect (meaning this test can be used) for the duration of the COVID-19 declaration under Section 564(b)(1) of the Act, 21 U.S.C. section 360bbb-3(b)(1), unless the authorization is terminated or revoked.  Performed at [facility], [address], [place], [place] [address]      Time coordinating  discharge: 40 minutes  SIGNED:   Alba Cory, MD  [facility]

## 2021-02-12 NOTE — Progress Notes (Signed)
Scratch her right buttock, noted with scratch marks. Cleansed and hydrocortisone  cream applied.

## 2021-02-12 NOTE — Progress Notes (Signed)
Discharged to [facility]  thru [facility] ambulance, belongings taken . Discharged instructions endorsed to ambulance staff to give to the staff.

## 2021-02-12 NOTE — Plan of Care (Signed)
  Problem: Education: Goal: Knowledge of General Education information will improve Description: Including pain rating scale, medication(s)/side effects and non-pharmacologic comfort measures Outcome: Progressing   Problem: Health Behavior/Discharge Planning: Goal: Ability to manage health-related needs will improve Outcome: Progressing   Problem: Clinical Measurements: Goal: Will remain free from infection Outcome: Progressing Goal: Diagnostic test results will improve Outcome: Progressing Goal: Respiratory complications will improve Outcome: Progressing   Problem: Activity: Goal: Risk for activity intolerance will decrease Outcome: Progressing   Problem: Pain Managment: Goal: General experience of comfort will improve Outcome: Progressing   Problem: Skin Integrity: Goal: Risk for impaired skin integrity will decrease Outcome: Progressing

## 2021-02-12 NOTE — Progress Notes (Signed)
Assisted to bedside commode with walker, moved her bowel and voided. Cleansed  perianal are no wound noted. Made comfortable on bed.

## 2021-02-12 NOTE — TOC Progression Note (Addendum)
Transition of Care [facility]) - Progression Note    Patient Details  Name: [name] MRN: [id number] Date of Birth: [birth year]  [facility]) CM/SW Contact  Ivette Loyal, [place] Phone Number: 02/12/2021, 9:28 AM  Clinical Narrative:    Wille Celeste at [facility] informed CSW that there is only one bed available and if the pt spouse could fill out paperwork today pt could go today. CSW contacted pt spouse who confirmed that he could fill out paperwork at 11:30am. CSW requested rapid covid.         Expected Discharge Plan and Services                                                 Social Determinants of Health (SDOH) Interventions    Readmission Risk Interventions No flowsheet data found.

## 2021-02-12 NOTE — Progress Notes (Signed)
Right leg wound draining to serosanguinous output in large amount. Site cleansed with saline , wrapped with 4x4, abd. Pads and kerlix.

## 2021-02-12 NOTE — Progress Notes (Signed)
Called in report to [facility], spoke with Cathleen. Awaiting [facility] pick up. Pt. Aware.

## 2021-02-12 NOTE — TOC Transition Note (Signed)
Transition of Care [facility]) - CM/SW Discharge Note   Patient Details  Name: [name] MRN: [id number] Date of Birth: Sep 20, 1950  Transition [facility]) CM/SW Contact:  Lynett Grimes Phone Number: 02/12/2021, 1:10 PM   Clinical Narrative:    Patient will [facility] to: [facility] Anticipated DC date: 02/12/2021 Family notified: Pt Spouse Transport by: Sharin Mons   Per MD patient ready for DC to [facility] room 3217. RN to call report prior to discharge [phone]). RN, patient, patient's family, and facility notified of DC. Discharge Summary and FL2 sent to facility. DC packet on chart. Ambulance transport requested for patient.   CSW will sign off for now as social work intervention is no longer needed. Please consult [place] again if new needs arise.           Patient Goals and CMS Choice        Discharge Placement                       Discharge Plan and Services                                     Social Determinants of Health (SDOH) Interventions     Readmission Risk Interventions No flowsheet data found.

## 2021-02-13 ENCOUNTER — Other Ambulatory Visit: Payer: Medicare PPO

## 2021-02-13 DIAGNOSIS — J45909 Unspecified asthma, uncomplicated: Secondary | ICD-10-CM | POA: Diagnosis not present

## 2021-02-13 DIAGNOSIS — M1990 Unspecified osteoarthritis, unspecified site: Secondary | ICD-10-CM | POA: Diagnosis not present

## 2021-02-13 DIAGNOSIS — L03115 Cellulitis of right lower limb: Secondary | ICD-10-CM | POA: Diagnosis not present

## 2021-02-13 DIAGNOSIS — N179 Acute kidney failure, unspecified: Secondary | ICD-10-CM | POA: Diagnosis not present

## 2021-02-13 DIAGNOSIS — I10 Essential (primary) hypertension: Secondary | ICD-10-CM | POA: Diagnosis not present

## 2021-02-13 DIAGNOSIS — I609 Nontraumatic subarachnoid hemorrhage, unspecified: Secondary | ICD-10-CM | POA: Diagnosis not present

## 2021-02-13 DIAGNOSIS — I5032 Chronic diastolic (congestive) heart failure: Secondary | ICD-10-CM | POA: Diagnosis not present

## 2021-02-13 DIAGNOSIS — I890 Lymphedema, not elsewhere classified: Secondary | ICD-10-CM | POA: Diagnosis not present

## 2021-02-13 DIAGNOSIS — K219 Gastro-esophageal reflux disease without esophagitis: Secondary | ICD-10-CM | POA: Diagnosis not present

## 2021-02-13 DIAGNOSIS — L97919 Non-pressure chronic ulcer of unspecified part of right lower leg with unspecified severity: Secondary | ICD-10-CM | POA: Diagnosis not present

## 2021-02-14 DIAGNOSIS — R569 Unspecified convulsions: Secondary | ICD-10-CM | POA: Diagnosis not present

## 2021-02-14 DIAGNOSIS — N179 Acute kidney failure, unspecified: Secondary | ICD-10-CM | POA: Diagnosis not present

## 2021-02-14 DIAGNOSIS — I890 Lymphedema, not elsewhere classified: Secondary | ICD-10-CM | POA: Diagnosis not present

## 2021-02-14 DIAGNOSIS — L0390 Cellulitis, unspecified: Secondary | ICD-10-CM | POA: Diagnosis not present

## 2021-02-17 ENCOUNTER — Other Ambulatory Visit: Payer: Self-pay

## 2021-02-17 ENCOUNTER — Encounter: Payer: Self-pay | Admitting: Adult Health

## 2021-02-17 ENCOUNTER — Ambulatory Visit: Payer: Medicare PPO | Admitting: Adult Health

## 2021-02-17 VITALS — BP 121/74 | HR 76 | Ht 62.0 in

## 2021-02-17 DIAGNOSIS — G2581 Restless legs syndrome: Secondary | ICD-10-CM | POA: Diagnosis not present

## 2021-02-17 DIAGNOSIS — Z9989 Dependence on other enabling machines and devices: Secondary | ICD-10-CM

## 2021-02-17 DIAGNOSIS — I5032 Chronic diastolic (congestive) heart failure: Secondary | ICD-10-CM | POA: Diagnosis not present

## 2021-02-17 DIAGNOSIS — R569 Unspecified convulsions: Secondary | ICD-10-CM

## 2021-02-17 DIAGNOSIS — G4733 Obstructive sleep apnea (adult) (pediatric): Secondary | ICD-10-CM | POA: Diagnosis not present

## 2021-02-17 DIAGNOSIS — R059 Cough, unspecified: Secondary | ICD-10-CM | POA: Diagnosis not present

## 2021-02-17 DIAGNOSIS — G894 Chronic pain syndrome: Secondary | ICD-10-CM | POA: Diagnosis not present

## 2021-02-17 DIAGNOSIS — S8011XD Contusion of right lower leg, subsequent encounter: Secondary | ICD-10-CM | POA: Diagnosis not present

## 2021-02-17 DIAGNOSIS — I10 Essential (primary) hypertension: Secondary | ICD-10-CM | POA: Diagnosis not present

## 2021-02-17 DIAGNOSIS — I890 Lymphedema, not elsewhere classified: Secondary | ICD-10-CM | POA: Diagnosis not present
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
## 2021-02-20 DIAGNOSIS — I5032 Chronic diastolic (congestive) heart failure: Secondary | ICD-10-CM | POA: Diagnosis not present

## 2021-02-20 DIAGNOSIS — I10 Essential (primary) hypertension: Secondary | ICD-10-CM | POA: Diagnosis not present

## 2021-02-20 DIAGNOSIS — F419 Anxiety disorder, unspecified: Secondary | ICD-10-CM | POA: Diagnosis not present

## 2021-02-20 DIAGNOSIS — U071 COVID-19: Secondary | ICD-10-CM | POA: Diagnosis not present

## 2021-02-20 DIAGNOSIS — R059 Cough, unspecified: Secondary | ICD-10-CM | POA: Diagnosis not present

## 2021-02-25 ENCOUNTER — Other Ambulatory Visit: Payer: Self-pay | Admitting: Adult Health

## 2021-02-25 DIAGNOSIS — I10 Essential (primary) hypertension: Secondary | ICD-10-CM | POA: Diagnosis not present

## 2021-02-25 DIAGNOSIS — R4182 Altered mental status, unspecified: Secondary | ICD-10-CM | POA: Diagnosis not present

## 2021-02-25 DIAGNOSIS — N390 Urinary tract infection, site not specified: Secondary | ICD-10-CM | POA: Diagnosis not present

## 2021-02-25 DIAGNOSIS — I5032 Chronic diastolic (congestive) heart failure: Secondary | ICD-10-CM | POA: Diagnosis not present

## 2021-02-25 DIAGNOSIS — G2581 Restless legs syndrome: Secondary | ICD-10-CM

## 2021-02-25 DIAGNOSIS — U071 COVID-19: Secondary | ICD-10-CM | POA: Diagnosis not present

## 2021-02-27 DIAGNOSIS — L97919 Non-pressure chronic ulcer of unspecified part of right lower leg with unspecified severity: Secondary | ICD-10-CM | POA: Diagnosis not present

## 2021-02-28 DIAGNOSIS — S8011XD Contusion of right lower leg, subsequent encounter: Secondary | ICD-10-CM | POA: Diagnosis not present

## 2021-02-28 DIAGNOSIS — I5032 Chronic diastolic (congestive) heart failure: Secondary | ICD-10-CM | POA: Diagnosis not present

## 2021-02-28 DIAGNOSIS — R4182 Altered mental status, unspecified: Secondary | ICD-10-CM | POA: Diagnosis not present

## 2021-02-28 DIAGNOSIS — N390 Urinary tract infection, site not specified: Secondary | ICD-10-CM | POA: Diagnosis not present

## 2021-02-28 DIAGNOSIS — I10 Essential (primary) hypertension: Secondary | ICD-10-CM | POA: Diagnosis not present

## 2021-03-01 ENCOUNTER — Inpatient Hospital Stay (HOSPITAL_BASED_OUTPATIENT_CLINIC_OR_DEPARTMENT_OTHER)
Admission: EM | Admit: 2021-03-01 | Discharge: 2021-03-11 | DRG: 070 | Disposition: A | Discharge status: Skilled Nursing Facility | Payer: Medicare PPO | Source: Skilled Nursing Facility | Attending: Internal Medicine | Admitting: Internal Medicine

## 2021-03-01 ENCOUNTER — Encounter (HOSPITAL_BASED_OUTPATIENT_CLINIC_OR_DEPARTMENT_OTHER): Payer: Self-pay

## 2021-03-01 ENCOUNTER — Emergency Department (HOSPITAL_BASED_OUTPATIENT_CLINIC_OR_DEPARTMENT_OTHER): Payer: Medicare PPO

## 2021-03-01 ENCOUNTER — Other Ambulatory Visit: Payer: Self-pay

## 2021-03-01 DIAGNOSIS — G9341 Metabolic encephalopathy: Principal | ICD-10-CM | POA: Diagnosis present

## 2021-03-01 DIAGNOSIS — N3000 Acute cystitis without hematuria: Secondary | ICD-10-CM | POA: Diagnosis present

## 2021-03-01 DIAGNOSIS — R600 Localized edema: Secondary | ICD-10-CM | POA: Diagnosis present

## 2021-03-01 DIAGNOSIS — Z1623 Resistance to quinolones and fluoroquinolones: Secondary | ICD-10-CM | POA: Diagnosis present

## 2021-03-01 DIAGNOSIS — G2581 Restless legs syndrome: Secondary | ICD-10-CM

## 2021-03-01 DIAGNOSIS — G9340 Encephalopathy, unspecified: Secondary | ICD-10-CM

## 2021-03-01 DIAGNOSIS — M1990 Unspecified osteoarthritis, unspecified site: Secondary | ICD-10-CM | POA: Diagnosis present

## 2021-03-01 DIAGNOSIS — R4189 Other symptoms and signs involving cognitive functions and awareness: Secondary | ICD-10-CM | POA: Diagnosis present

## 2021-03-01 DIAGNOSIS — M6281 Muscle weakness (generalized): Secondary | ICD-10-CM | POA: Diagnosis not present

## 2021-03-01 DIAGNOSIS — Z888 Allergy status to other drugs, medicaments and biological substances status: Secondary | ICD-10-CM

## 2021-03-01 DIAGNOSIS — E7800 Pure hypercholesterolemia, unspecified: Secondary | ICD-10-CM | POA: Diagnosis present

## 2021-03-01 DIAGNOSIS — E876 Hypokalemia: Secondary | ICD-10-CM | POA: Diagnosis present

## 2021-03-01 DIAGNOSIS — G4730 Sleep apnea, unspecified: Secondary | ICD-10-CM | POA: Diagnosis not present

## 2021-03-01 DIAGNOSIS — E785 Hyperlipidemia, unspecified: Secondary | ICD-10-CM | POA: Diagnosis not present

## 2021-03-01 DIAGNOSIS — L02619 Cutaneous abscess of unspecified foot: Secondary | ICD-10-CM | POA: Diagnosis not present

## 2021-03-01 DIAGNOSIS — B9620 Unspecified Escherichia coli [E. coli] as the cause of diseases classified elsewhere: Secondary | ICD-10-CM | POA: Diagnosis present

## 2021-03-01 DIAGNOSIS — Z801 Family history of malignant neoplasm of trachea, bronchus and lung: Secondary | ICD-10-CM

## 2021-03-01 DIAGNOSIS — R159 Full incontinence of feces: Secondary | ICD-10-CM | POA: Diagnosis present

## 2021-03-01 DIAGNOSIS — I872 Venous insufficiency (chronic) (peripheral): Secondary | ICD-10-CM | POA: Diagnosis present

## 2021-03-01 DIAGNOSIS — R4182 Altered mental status, unspecified: Secondary | ICD-10-CM

## 2021-03-01 DIAGNOSIS — I890 Lymphedema, not elsewhere classified: Secondary | ICD-10-CM | POA: Diagnosis present

## 2021-03-01 DIAGNOSIS — Z87891 Personal history of nicotine dependence: Secondary | ICD-10-CM

## 2021-03-01 DIAGNOSIS — L03116 Cellulitis of left lower limb: Secondary | ICD-10-CM | POA: Diagnosis not present

## 2021-03-01 DIAGNOSIS — Z7401 Bed confinement status: Secondary | ICD-10-CM | POA: Diagnosis not present

## 2021-03-01 DIAGNOSIS — D649 Anemia, unspecified: Secondary | ICD-10-CM | POA: Diagnosis present

## 2021-03-01 DIAGNOSIS — G40909 Epilepsy, unspecified, not intractable, without status epilepticus: Secondary | ICD-10-CM | POA: Diagnosis present

## 2021-03-01 DIAGNOSIS — L03115 Cellulitis of right lower limb: Secondary | ICD-10-CM | POA: Diagnosis not present

## 2021-03-01 DIAGNOSIS — Z981 Arthrodesis status: Secondary | ICD-10-CM

## 2021-03-01 DIAGNOSIS — U071 COVID-19: Secondary | ICD-10-CM

## 2021-03-01 DIAGNOSIS — G4733 Obstructive sleep apnea (adult) (pediatric): Secondary | ICD-10-CM | POA: Diagnosis present

## 2021-03-01 DIAGNOSIS — Z9989 Dependence on other enabling machines and devices: Secondary | ICD-10-CM | POA: Diagnosis not present

## 2021-03-01 DIAGNOSIS — Z6841 Body Mass Index (BMI) 40.0 and over, adult: Secondary | ICD-10-CM | POA: Diagnosis not present

## 2021-03-01 DIAGNOSIS — I10 Essential (primary) hypertension: Secondary | ICD-10-CM

## 2021-03-01 DIAGNOSIS — K219 Gastro-esophageal reflux disease without esophagitis: Secondary | ICD-10-CM | POA: Diagnosis present

## 2021-03-01 DIAGNOSIS — N390 Urinary tract infection, site not specified: Secondary | ICD-10-CM | POA: Diagnosis not present

## 2021-03-01 DIAGNOSIS — Z79899 Other long term (current) drug therapy: Secondary | ICD-10-CM

## 2021-03-01 DIAGNOSIS — M79604 Pain in right leg: Secondary | ICD-10-CM | POA: Diagnosis not present

## 2021-03-01 DIAGNOSIS — Z90710 Acquired absence of both cervix and uterus: Secondary | ICD-10-CM

## 2021-03-01 DIAGNOSIS — I959 Hypotension, unspecified: Secondary | ICD-10-CM | POA: Diagnosis present

## 2021-03-01 DIAGNOSIS — Z91041 Radiographic dye allergy status: Secondary | ICD-10-CM

## 2021-03-01 DIAGNOSIS — G9389 Other specified disorders of brain: Secondary | ICD-10-CM

## 2021-03-01 DIAGNOSIS — Z7982 Long term (current) use of aspirin: Secondary | ICD-10-CM

## 2021-03-01 DIAGNOSIS — Z885 Allergy status to narcotic agent status: Secondary | ICD-10-CM

## 2021-03-01 DIAGNOSIS — Z91048 Other nonmedicinal substance allergy status: Secondary | ICD-10-CM

## 2021-03-01 LAB — COMPREHENSIVE METABOLIC PANEL
ALT: 15 U/L (ref 0–44)
AST: 18 U/L (ref 15–41)
Albumin: 3.7 g/dL (ref 3.5–5.0)
Alkaline Phosphatase: 86 U/L (ref 38–126)
Anion gap: 8 (ref 5–15)
BUN: 15 mg/dL (ref 8–23)
CO2: 31 mmol/L (ref 22–32)
Calcium: 9.7 mg/dL (ref 8.9–10.3)
Chloride: 97 mmol/L — ABNORMAL LOW (ref 98–111)
Creatinine, Ser: 0.95 mg/dL (ref 0.44–1.00)
GFR, Estimated: >60 mL/min (ref >60)
Glucose, Bld: 87 mg/dL (ref 70–99)
Potassium: 3.2 mmol/L — ABNORMAL LOW (ref 3.5–5.1)
Sodium: 136 mmol/L (ref 135–145)
Total Bilirubin: 0.5 mg/dL (ref 0.3–1.2)
Total Protein: 6.9 g/dL (ref 6.5–8.1)

## 2021-03-01 LAB — CBC WITH DIFFERENTIAL/PLATELET
Abs Immature Granulocytes: 0.02 10*3/uL (ref 0.00–0.07)
Basophils Absolute: 0 10*3/uL (ref 0.0–0.1)
Basophils Relative: 0 %
Eosinophils Absolute: 0 10*3/uL (ref 0.0–0.5)
Eosinophils Relative: 1 %
HCT: 33.3 % — ABNORMAL LOW (ref 36.0–46.0)
Hemoglobin: 11.2 g/dL — ABNORMAL LOW (ref 12.0–15.0)
Immature Granulocytes: 0 %
Lymphocytes Relative: 28 %
Lymphs Abs: 1.9 10*3/uL (ref 0.7–4.0)
MCH: 30.9 pg (ref 26.0–34.0)
MCHC: 33.6 g/dL (ref 30.0–36.0)
MCV: 92 fL (ref 80.0–100.0)
Monocytes Absolute: 0.7 10*3/uL (ref 0.1–1.0)
Monocytes Relative: 10 %
Neutro Abs: 4.2 10*3/uL (ref 1.7–7.7)
Neutrophils Relative %: 61 %
Platelets: 310 10*3/uL (ref 150–400)
RBC: 3.62 MIL/uL — ABNORMAL LOW (ref 3.87–5.11)
RDW: 14.6 % (ref 11.5–15.5)
WBC: 6.9 10*3/uL (ref 4.0–10.5)
nRBC: 0 % (ref 0.0–0.2)

## 2021-03-01 LAB — URINALYSIS, ROUTINE W REFLEX MICROSCOPIC
Bilirubin Urine: NEGATIVE
Glucose, UA: NEGATIVE mg/dL
Hgb urine dipstick: NEGATIVE
Ketones, ur: NEGATIVE mg/dL
Nitrite: POSITIVE — AB
Protein, ur: NEGATIVE mg/dL
Specific Gravity, Urine: 1.008 (ref 1.005–1.030)
pH: 5.5 (ref 5.0–8.0)

## 2021-03-01 LAB — RESP PANEL BY RT-PCR (FLU A&B, COVID) ARPGX2
Influenza A by PCR: NEGATIVE
Influenza B by PCR: NEGATIVE
SARS Coronavirus 2 by RT PCR: POSITIVE — AB

## 2021-03-01 LAB — C-REACTIVE PROTEIN: CRP: 1.8 mg/dL — ABNORMAL HIGH (ref <1.0)

## 2021-03-01 LAB — AMMONIA: Ammonia: 23 umol/L (ref 9–35)

## 2021-03-01 LAB — PROCALCITONIN: Procalcitonin: <0.1 ng/mL

## 2021-03-01 LAB — PHOSPHORUS: Phosphorus: 3.1 mg/dL (ref 2.5–4.6)

## 2021-03-01 LAB — SEDIMENTATION RATE: Sed Rate: 85 mm/hr — ABNORMAL HIGH (ref 0–22)

## 2021-03-01 LAB — LACTIC ACID, PLASMA: Lactic Acid, Venous: 1.3 mmol/L (ref 0.5–1.9)

## 2021-03-01 LAB — FERRITIN: Ferritin: 304 ng/mL (ref 11–307)

## 2021-03-01 LAB — TSH: TSH: 2.033 u[IU]/mL (ref 0.350–4.500)

## 2021-03-01 LAB — D-DIMER, QUANTITATIVE: D-Dimer, Quant: 2.67 ug/mL-FEU — ABNORMAL HIGH (ref 0.00–0.50)

## 2021-03-01 LAB — MAGNESIUM: Magnesium: 1.8 mg/dL (ref 1.7–2.4)

## 2021-03-01 MED ORDER — SODIUM CHLORIDE 0.9 % IV SOLN
1.0000 g | Freq: Once | INTRAVENOUS | Status: AC
  Administered 2021-03-01: 1 g via INTRAVENOUS
  Filled 2021-03-01: qty 10

## 2021-03-01 MED ORDER — ACETAMINOPHEN 650 MG RE SUPP: 650.0000 mg | Freq: Four times a day (QID) | RECTAL | Status: DC | PRN

## 2021-03-01 MED ORDER — ACETAMINOPHEN 325 MG PO TABS
650.0000 mg | ORAL_TABLET | Freq: Four times a day (QID) | ORAL | Status: DC | PRN
  Administered 2021-03-02 – 2021-03-06 (×4): 650 mg via ORAL
  Filled 2021-03-01 (×4): qty 2

## 2021-03-01 MED ORDER — SODIUM CHLORIDE 0.9 % IV SOLN: 200.0000 mg | Freq: Once | INTRAVENOUS | Status: DC

## 2021-03-01 MED ORDER — ROSUVASTATIN CALCIUM 20 MG PO TABS
20.0000 mg | ORAL_TABLET | Freq: Every day | ORAL | Status: DC
  Administered 2021-03-02 – 2021-03-11 (×10): 20 mg via ORAL
  Filled 2021-03-01 (×10): qty 1

## 2021-03-01 MED ORDER — GUAIFENESIN-DM 100-10 MG/5ML PO SYRP
10.0000 mL | ORAL_SOLUTION | ORAL | Status: DC | PRN
  Filled 2021-03-01: qty 10

## 2021-03-01 MED ORDER — REMDESIVIR 100 MG IV SOLR: 100.0000 mg | INTRAVENOUS | Status: AC

## 2021-03-01 MED ORDER — ASCORBIC ACID 500 MG PO TABS
500.0000 mg | ORAL_TABLET | Freq: Every day | ORAL | Status: DC
  Administered 2021-03-02 – 2021-03-11 (×11): 500 mg via ORAL
  Filled 2021-03-01 (×10): qty 1

## 2021-03-01 MED ORDER — PANTOPRAZOLE SODIUM 40 MG PO TBEC
40.0000 mg | DELAYED_RELEASE_TABLET | Freq: Every day | ORAL | Status: DC
  Administered 2021-03-02 – 2021-03-11 (×10): 40 mg via ORAL
  Filled 2021-03-01 (×10): qty 1

## 2021-03-01 MED ORDER — POLYETHYLENE GLYCOL 3350 17 G PO PACK: 17.0000 g | PACK | Freq: Every day | ORAL | Status: DC | PRN

## 2021-03-01 MED ORDER — PRAMIPEXOLE DIHYDROCHLORIDE ER 0.75 MG PO TB24: 1.0000 | ORAL_TABLET | Freq: Every day | ORAL | Status: DC

## 2021-03-01 MED ORDER — ALBUTEROL SULFATE HFA 108 (90 BASE) MCG/ACT IN AERS
2.0000 | INHALATION_SPRAY | RESPIRATORY_TRACT | Status: DC | PRN
  Filled 2021-03-01: qty 6.7

## 2021-03-01 MED ORDER — HYDROXYZINE HCL 25 MG PO TABS
25.0000 mg | ORAL_TABLET | Freq: Once | ORAL | Status: AC
  Administered 2021-03-01: 25 mg via ORAL
  Filled 2021-03-01: qty 1

## 2021-03-01 MED ORDER — MONTELUKAST SODIUM 10 MG PO TABS
10.0000 mg | ORAL_TABLET | Freq: Every day | ORAL | Status: DC
  Administered 2021-03-01 – 2021-03-10 (×10): 10 mg via ORAL
  Filled 2021-03-01 (×10): qty 1

## 2021-03-01 MED ORDER — MAGNESIUM SULFATE 2 GM/50ML IV SOLN
2.0000 g | Freq: Once | INTRAVENOUS | Status: AC
  Administered 2021-03-01: 2 g via INTRAVENOUS
  Filled 2021-03-01: qty 50

## 2021-03-01 MED ORDER — SODIUM CHLORIDE 0.9 % IV BOLUS
1000.0000 mL | Freq: Once | INTRAVENOUS | Status: AC
  Administered 2021-03-01: 1000 mL via INTRAVENOUS

## 2021-03-01 MED ORDER — SODIUM CHLORIDE 0.9 % IV SOLN: 100.0000 mg | Freq: Every day | INTRAVENOUS | Status: DC

## 2021-03-01 MED ORDER — ENOXAPARIN SODIUM 60 MG/0.6ML IJ SOSY
60.0000 mg | PREFILLED_SYRINGE | INTRAMUSCULAR | Status: DC
  Administered 2021-03-01 – 2021-03-10 (×10): 60 mg via SUBCUTANEOUS
  Filled 2021-03-01 (×10): qty 0.6

## 2021-03-01 MED ORDER — SODIUM CHLORIDE 0.9 % IV SOLN
100.0000 mg | Freq: Every day | INTRAVENOUS | Status: DC
  Administered 2021-03-01 – 2021-03-02 (×3): 100 mg via INTRAVENOUS
  Filled 2021-03-01 (×2): qty 20

## 2021-03-01 MED ORDER — SODIUM CHLORIDE 0.9% FLUSH
3.0000 mL | Freq: Two times a day (BID) | INTRAVENOUS | Status: DC
  Administered 2021-03-02 – 2021-03-03 (×4): 3 mL via INTRAVENOUS

## 2021-03-01 MED ORDER — PHENOBARBITAL 32.4 MG PO TABS
129.6000 mg | ORAL_TABLET | Freq: Every day | ORAL | Status: DC
  Administered 2021-03-01 – 2021-03-10 (×10): 129.6 mg via ORAL
  Filled 2021-03-01 (×10): qty 4

## 2021-03-01 MED ORDER — ZINC SULFATE 220 (50 ZN) MG PO CAPS
220.0000 mg | ORAL_CAPSULE | Freq: Every day | ORAL | Status: DC
  Administered 2021-03-02 – 2021-03-11 (×10): 220 mg via ORAL
  Filled 2021-03-01 (×10): qty 1

## 2021-03-01 MED ORDER — POTASSIUM CHLORIDE 10 MEQ/100ML IV SOLN
10.0000 meq | INTRAVENOUS | Status: AC
  Administered 2021-03-01 (×5): 10 meq via INTRAVENOUS
  Filled 2021-03-01 (×3): qty 100

## 2021-03-01 MED ORDER — SODIUM CHLORIDE 0.9 % IV SOLN
2.0000 g | INTRAVENOUS | Status: DC
  Administered 2021-03-02: 2 g via INTRAVENOUS
  Filled 2021-03-01 (×2): qty 20

## 2021-03-01 NOTE — ED Triage Notes (Addendum)
Pt is coming from [facility] for increase altered mental status that started this morning. Pt was previously being treated for UTI and COVID five days ago. Per EMS, patient was talking about her dead husband and "talking out of her head". Pt is also present for acute on chronic right leg cellulitis w/ lymphedema. Pt able to answer all questions correctly, except for the year.

## 2021-03-01 NOTE — ED Triage Notes (Signed)
Report given to Kep'el, Charity fundraiser on [address] [place] at [employer].
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
## 2021-03-01 NOTE — ED Notes (Signed)
Date and time results received: 03/01/21 858   Test: Covid  Critical Value: Positive  Name of Provider Notified: Dr. Audley Hose  Orders Received? Or Actions Taken?: No new orders given.

## 2021-03-01 NOTE — ED Provider Notes (Signed)
Patient signed out to me is pending ancillary studies.  Labs are unremarkable white count normal lactic acid normal chemistry normal.  Blood pressure however dropped to the 80s systolic.  She was given a liter bolus of fluid resuscitation with some response but still blood pressure appears very soft.  COVID test is positive as expected.  Patient to be admitted to the hospitalist team.   Cheryll Cockayne, MD 03/01/21 [phone]

## 2021-03-01 NOTE — ED Notes (Signed)
Purewik placed on patient with clean diaper and gown. Pt attached to BP, 02, and cardiac monitor.
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
## 2021-03-01 NOTE — ED Notes (Signed)
She is awake and alert. Her speech is quite clear. She is able to tell me her name and d.o.b., and that Jackquline Bosch is president; however, she does not know day/date.

## 2021-03-01 NOTE — ED Notes (Signed)
RT Note: ISTAT-VBG results not crossing over into Epic. -Venous Blood Gas results from ISTAT are as follows: pH: 7.417 PC02: 49.4 P02: 23 HC03: 31.8 BE,B: 6 TC02: 33 S02: 41% MD/RN/LAB made aware. EDIT Sheet Completed/Faxed to [facility] [phone]

## 2021-03-01 NOTE — ED Notes (Signed)
As I write this, [facility] is here to load pt. Onto their stretcher. Pt. Remains in no distress. Her husband is with her also. She remains awake, alert and mildly confused. She is able to call her husband by his name. She still is unsure of day/date.

## 2021-03-02 ENCOUNTER — Encounter (HOSPITAL_COMMUNITY): Payer: Self-pay | Admitting: *Deleted

## 2021-03-02 DIAGNOSIS — I10 Essential (primary) hypertension: Secondary | ICD-10-CM | POA: Diagnosis not present

## 2021-03-02 DIAGNOSIS — U071 COVID-19: Secondary | ICD-10-CM | POA: Diagnosis not present

## 2021-03-02 DIAGNOSIS — G9340 Encephalopathy, unspecified: Secondary | ICD-10-CM | POA: Diagnosis not present

## 2021-03-02 DIAGNOSIS — E876 Hypokalemia: Secondary | ICD-10-CM | POA: Diagnosis not present

## 2021-03-02 LAB — COMPREHENSIVE METABOLIC PANEL
ALT: 16 U/L (ref 0–44)
AST: 20 U/L (ref 15–41)
Albumin: 2.9 g/dL — ABNORMAL LOW (ref 3.5–5.0)
Alkaline Phosphatase: 77 U/L (ref 38–126)
Anion gap: 7 (ref 5–15)
BUN: 8 mg/dL (ref 8–23)
CO2: 27 mmol/L (ref 22–32)
Calcium: 8.6 mg/dL — ABNORMAL LOW (ref 8.9–10.3)
Chloride: 106 mmol/L (ref 98–111)
Creatinine, Ser: 0.71 mg/dL (ref 0.44–1.00)
GFR, Estimated: >60 mL/min (ref >60)
Glucose, Bld: 82 mg/dL (ref 70–99)
Potassium: 3 mmol/L — ABNORMAL LOW (ref 3.5–5.1)
Sodium: 140 mmol/L (ref 135–145)
Total Bilirubin: 0.5 mg/dL (ref 0.3–1.2)
Total Protein: 6.3 g/dL — ABNORMAL LOW (ref 6.5–8.1)

## 2021-03-02 LAB — C-REACTIVE PROTEIN: CRP: 1.5 mg/dL — ABNORMAL HIGH (ref <1.0)

## 2021-03-02 LAB — I-STAT VENOUS BLOOD GAS, ED
Acid-Base Excess: 6 mmol/L — ABNORMAL HIGH (ref 0.0–2.0)
Bicarbonate: 31.8 mmol/L — ABNORMAL HIGH (ref 20.0–28.0)
Calcium, Ion: 1.2 mmol/L (ref 1.15–1.40)
HCT: 29 % — ABNORMAL LOW (ref 36.0–46.0)
Hemoglobin: 9.9 g/dL — ABNORMAL LOW (ref 12.0–15.0)
O2 Saturation: 41 %
Patient temperature: 98.6
Potassium: 3.1 mmol/L — ABNORMAL LOW (ref 3.5–5.1)
Sodium: 137 mmol/L (ref 135–145)
TCO2: 33 mmol/L — ABNORMAL HIGH (ref 22–32)
pCO2, Ven: 49.4 mmHg (ref 44.0–60.0)
pH, Ven: 7.417 (ref 7.250–7.430)
pO2, Ven: 23 mmHg — CL (ref 32.0–45.0)

## 2021-03-02 LAB — CBC WITH DIFFERENTIAL/PLATELET
Abs Immature Granulocytes: 0.01 10*3/uL (ref 0.00–0.07)
Basophils Absolute: 0 10*3/uL (ref 0.0–0.1)
Basophils Relative: 0 %
Eosinophils Absolute: 0 10*3/uL (ref 0.0–0.5)
Eosinophils Relative: 1 %
HCT: 30.1 % — ABNORMAL LOW (ref 36.0–46.0)
Hemoglobin: 9.7 g/dL — ABNORMAL LOW (ref 12.0–15.0)
Immature Granulocytes: 0 %
Lymphocytes Relative: 36 %
Lymphs Abs: 1.8 10*3/uL (ref 0.7–4.0)
MCH: 30.8 pg (ref 26.0–34.0)
MCHC: 32.2 g/dL (ref 30.0–36.0)
MCV: 95.6 fL (ref 80.0–100.0)
Monocytes Absolute: 0.5 10*3/uL (ref 0.1–1.0)
Monocytes Relative: 11 %
Neutro Abs: 2.7 10*3/uL (ref 1.7–7.7)
Neutrophils Relative %: 52 %
Platelets: 271 10*3/uL (ref 150–400)
RBC: 3.15 MIL/uL — ABNORMAL LOW (ref 3.87–5.11)
RDW: 14.8 % (ref 11.5–15.5)
WBC: 5.1 10*3/uL (ref 4.0–10.5)
nRBC: 0 % (ref 0.0–0.2)

## 2021-03-02 LAB — VITAMIN B12: Vitamin B-12: 311 pg/mL (ref 180–914)

## 2021-03-02 LAB — FOLATE: Folate: 3.5 ng/mL — ABNORMAL LOW (ref >5.9)

## 2021-03-02 LAB — D-DIMER, QUANTITATIVE: D-Dimer, Quant: 2.73 ug/mL-FEU — ABNORMAL HIGH (ref 0.00–0.50)

## 2021-03-02 LAB — PHOSPHORUS: Phosphorus: 2.7 mg/dL (ref 2.5–4.6)

## 2021-03-02 LAB — FERRITIN: Ferritin: 250 ng/mL (ref 11–307)

## 2021-03-02 LAB — TSH: TSH: 2.625 u[IU]/mL (ref 0.350–4.500)

## 2021-03-02 LAB — PHENOBARBITAL LEVEL: Phenobarbital: 19.4 ug/mL (ref 15.0–30.0)

## 2021-03-02 LAB — MAGNESIUM: Magnesium: 1.9 mg/dL (ref 1.7–2.4)

## 2021-03-02 LAB — AMMONIA: Ammonia: 38 umol/L — ABNORMAL HIGH (ref 9–35)

## 2021-03-02 MED ORDER — LORAZEPAM 2 MG/ML IJ SOLN
1.0000 mg | INTRAMUSCULAR | Status: DC | PRN
  Administered 2021-03-02 – 2021-03-04 (×5): 1 mg via INTRAVENOUS
  Filled 2021-03-02 (×6): qty 1

## 2021-03-02 MED ORDER — HYDROCERIN EX CREA
TOPICAL_CREAM | Freq: Every day | CUTANEOUS | Status: DC
  Administered 2021-03-03 – 2021-03-04 (×2): 1 via TOPICAL
  Filled 2021-03-02 (×2): qty 113

## 2021-03-02 NOTE — Plan of Care (Signed)
Pt will remain free of falls, injury and respiratory distress throughout shift.

## 2021-03-02 NOTE — Consult Note (Signed)
WOC Nurse Consult Note: Reason for Consult:Full thickness wound on Right LE. The Bedside RN and I are assisted in the assessment of this wound and the development of the POC by Wound Treatment Associate D. Dark, RN. Wound type: venous insufficiency/lymphedema with full thickness wound. Clinical CEAP 6 Pressure Injury POA: N/A Measurement: 1cm round x 3cm depth Wound bed:red, moist Drainage (amount, consistency, odor) yellow exudate Periwound: edematous (lymphedema) and erythematous, dry, flaking Dressing procedure/placement/frequency: I will provide guidance for Nursing in the care of this full thickess wound using a silver hydrofiber (Aquacel Ag+ Advantage, [place] # [phone]) packing strip, topping with an ABD pad and securing with a Kerlix roll gauze topped with a 6-inch ACE bandage for mild compression. Prior to performing wound care, Nursing is to wash the LE with soap and water, rinse, pat dry and apply Eucerin cream. Bilateral heels are to be placed into pressure redistribution heel boots and a sacral foam applied to the sacrum for PI prevention.  WOC nursing team will not follow, but will remain available to this patient, the nursing and medical teams.  Please re-consult if needed. Thanks, Ladona Mow, MSN, RN, GNP, [place]  Pager# [phone]

## 2021-03-02 NOTE — Progress Notes (Signed)
Patient alert to self and month. Patient noted to have hallucinations. Patient Keeps stating "get this man off of me "  and calling for her momma. Patient attempting to get out of bed  and has removed telemetry box, IV and safety mittens . Writer has attempted to redirect patient multiple times with no success. Will notify MD for additional support/ interventions.

## 2021-03-02 NOTE — Progress Notes (Signed)
PROGRESS NOTE  [name] AYT:[id number] DOB: Sep 04, 1950 DOA: 03/01/2021 PCP: [name], MD  HPI/Recap of past 24 hours: 70 year old female with past medical history of lymphedema, morbid obesity, obstructive sleep apnea on CPAP and seizure who comes from a nursing facility with recent treatment for both UTI and COVID few days prior sent to the emergency room on 10/22 for acute encephalopathy including hallucinations.  Patient noted to have minimally elevated CRP and normal procalcitonin levels.  Phenobarbital level therapeutic I & D urinalysis notes still nitrite positive with many bacteria though only few white cells with urine culture pending.  Admitted to the hospitalist service.  Patient still quite actively confused, trying to remove telemetry or IV.  Assessment/Plan: Principal Problem:   Acute encephalopathy: Unclear etiology.  May be from infection.  Less likely seizure given therapeutic phenobarbital level.  CT scan of head negative.  Not hypoxic.  Trying IV Rocephin for treating UTI.  Supplemental care.  As needed Ativan Active Problems:   Sleep apnea with use of continuous positive airway pressure (CPAP)   Restless legs syndrome (RLS)   Seizure disorder (HCC): No evidence of active seizure.  Phenobarbital level normal.  If confusion persists, may check EEG in next few days   Essential hypertension   Bilateral lower extremity edema   GERD (gastroesophageal reflux disease)   OSA on CPAP: CPAP if patient becomes much more appropriate   Lymphedema of right lower extremity   Hypokalemia: Replacing as needed.    COVID-19 virus infection: More like incidental finding.  CRP within normal limits and patient not hypoxic.  Continue to monitor closely.  Morbid obesity: Patient meets criteria BMI greater than 40  Code Status: Full code   Family Communication: Left message for family  Disposition Plan: Return to SNF once encephalopathy resolves  Consultants: None    Procedures: None   Antimicrobials: IV Rocephin   DVT prophylaxis:  Lovenox  Level of care: Progressive   Objective: Vitals:   03/02/21 0417 03/02/21 0804  BP: (!) 135/53 (!) 153/66  Pulse: 96 81  Resp: 20 18  Temp: 98.7 F (37.1 C) 98.3 F (36.8 C)  SpO2: 100% 95%    Intake/Output Summary (Last 24 hours) at 03/02/2021 0927 Last data filed at 03/02/2021 0421 Gross per 24 hour  Intake 100 ml  Output 200 ml  Net -100 ml   Filed Weights   03/01/21 0625 03/01/21 1856 03/01/21 2200  Weight: 122 kg 120.5 kg 120.7 kg   Body mass index is 48.65 kg/m.  Exam:  General: Awake, oriented x1, agitated HEENT: Normocephalic and atraumatic, mucous membranes slightly dry Cardiovascular: Regular rate and rhythm, S1-S2 Respiratory: Clear to auscultation bilaterally Abdomen: Soft, obese, nontender, positive bowel sounds Musculoskeletal: 2+ pitting edema from the knees down bilaterally Skin: Chronic lymphedema with keratosis on right lower extremity below the knee.  On left lower extremity she has an open wound approximately 2 inches across. Psychiatry: Acutely delirious.  Unclear if she has underlying dementia. Neurology: No overt deficits although acute delirium makes is difficult to assess.   Data Reviewed: CBC: Recent Labs  Lab 03/01/21 0650 03/02/21 0512  WBC 6.9 5.1  NEUTROABS 4.2 2.7  HGB 11.2* 9.7*  HCT 33.3* 30.1*  MCV 92.0 95.6  PLT 310 271   Basic Metabolic Panel: Recent Labs  Lab 03/01/21 0650 03/02/21 0512  NA 136 140  K 3.2* 3.0*  CL 97* 106  CO2 31 27  GLUCOSE 87 82  BUN 15 8  CREATININE  0.95 0.71  CALCIUM 9.7 8.6*  MG 1.8 1.9  PHOS 3.1 2.7   GFR: Estimated Creatinine Clearance: 82 mL/min (by C-G formula based on SCr of 0.71 mg/dL). Liver Function Tests: Recent Labs  Lab 03/01/21 0650 03/02/21 0512  AST 18 20  ALT 15 16  ALKPHOS 86 77  BILITOT 0.5 0.5  PROT 6.9 6.3*  ALBUMIN 3.7 2.9*   No results for input(s): LIPASE, AMYLASE in  the last 168 hours. Recent Labs  Lab 03/01/21 0650  AMMONIA 23   Coagulation Profile: No results for input(s): INR, PROTIME in the last 168 hours. Cardiac Enzymes: No results for input(s): CKTOTAL, CKMB, CKMBINDEX, TROPONINI in the last 168 hours. BNP (last 3 results) No results for input(s): PROBNP in the last 8760 hours. HbA1C: No results for input(s): HGBA1C in the last 72 hours. CBG: No results for input(s): GLUCAP in the last 168 hours. Lipid Profile: No results for input(s): CHOL, HDL, LDLCALC, TRIG, CHOLHDL, LDLDIRECT in the last 72 hours. Thyroid Function Tests: Recent Labs    03/01/21 2027  TSH 2.033   Anemia Panel: Recent Labs    03/01/21 0945 03/02/21 0512  FERRITIN 304 250   Urine analysis:    Component Value Date/Time   COLORURINE YELLOW 03/01/2021 0756   APPEARANCEUR CLEAR 03/01/2021 0756   LABSPEC 1.008 03/01/2021 0756   PHURINE 5.5 03/01/2021 0756   GLUCOSEU NEGATIVE 03/01/2021 0756   HGBUR NEGATIVE 03/01/2021 0756   BILIRUBINUR NEGATIVE 03/01/2021 0756   KETONESUR NEGATIVE 03/01/2021 0756   PROTEINUR NEGATIVE 03/01/2021 0756   NITRITE POSITIVE (A) 03/01/2021 0756   LEUKOCYTESUR SMALL (A) 03/01/2021 0756   Sepsis Labs: @LABRCNTIP (procalcitonin:4,lacticidven:4)  ) Recent Results (from the past 240 hour(s))  Resp Panel by RT-PCR (Flu A&B, Covid) Nasopharyngeal Swab     Status: Abnormal   Collection Time: 03/01/21  7:56 AM   Specimen: Nasopharyngeal Swab; Nasopharyngeal(NP) swabs in vial transport medium  Result Value Ref Range Status   SARS Coronavirus 2 by RT PCR POSITIVE (A) NEGATIVE Final    Comment: RESULT CALLED TO, READ BACK BY AND VERIFIED WITH: 03/03/21 Cleora Fleet 03/01/2021 DBRADLEY (NOTE) SARS-CoV-2 target nucleic acids are DETECTED.  The SARS-CoV-2 RNA is generally detectable in upper respiratory specimens during the acute phase of infection. Positive results are indicative of the presence of the identified virus, but do not  rule out bacterial infection or co-infection with other pathogens not detected by the test. Clinical correlation with patient history and other diagnostic information is necessary to determine patient infection status. The expected result is Negative.  Fact Sheet for Patients: 03/03/2021  Fact Sheet for Healthcare Providers: BloggerCourse.com  This test is not yet approved or cleared by the [place] FDA and  has been authorized for detection and/or diagnosis of SARS-CoV-2 by FDA under an Emergency Use Authorization (EUA).  This EUA will remain in effect (meaning this test c an be used) for the duration of  the COVID-19 declaration under Section 564(b)(1) of the Act, 21 U.S.C. section 360bbb-3(b)(1), unless the authorization is terminated or revoked sooner.     Influenza A by PCR NEGATIVE NEGATIVE Final   Influenza B by PCR NEGATIVE NEGATIVE Final    Comment: (NOTE) The Xpert Xpress SARS-CoV-2/FLU/RSV plus assay is intended as an aid in the diagnosis of influenza from Nasopharyngeal swab specimens and should not be used as a sole basis for treatment. Nasal washings and aspirates are unacceptable for Xpert Xpress SARS-CoV-2/FLU/RSV testing.  Fact Sheet for Patients: [place]  Fact  Sheet for Healthcare Providers: SeriousBroker.it  This test is not yet approved or cleared by the [place] and has been authorized for detection and/or diagnosis of SARS-CoV-2 by FDA under an Emergency Use Authorization (EUA). This EUA will remain in effect (meaning this test can be used) for the duration of the COVID-19 declaration under Section 564(b)(1) of the Act, 21 U.S.C. section 360bbb-3(b)(1), unless the authorization is terminated or revoked.  Performed at [facility], [address], [place], [place] [address]       Studies: No results  found.  Scheduled Meds:  vitamin C  500 mg Oral Daily   enoxaparin (LOVENOX) injection  60 mg Subcutaneous Q24H   montelukast  10 mg Oral QHS   pantoprazole  40 mg Oral Daily   PHENobarbital  129.6 mg Oral QHS   Pramipexole Dihydrochloride  1 tablet Oral QHS   rosuvastatin  20 mg Oral Daily   sodium chloride flush  3 mL Intravenous Q12H   zinc sulfate  220 mg Oral Daily    Continuous Infusions:  cefTRIAXone (ROCEPHIN)  IV     remdesivir 100 mg in NS 100 mL Stopped (03/01/21 1707)     LOS: 0 days     Hollice Espy, MD [facility]   03/02/2021, 9:27 AM

## 2021-03-03 DIAGNOSIS — I959 Hypotension, unspecified: Secondary | ICD-10-CM | POA: Diagnosis present

## 2021-03-03 DIAGNOSIS — G40909 Epilepsy, unspecified, not intractable, without status epilepticus: Secondary | ICD-10-CM | POA: Diagnosis present

## 2021-03-03 DIAGNOSIS — Z1623 Resistance to quinolones and fluoroquinolones: Secondary | ICD-10-CM | POA: Diagnosis present

## 2021-03-03 DIAGNOSIS — N3000 Acute cystitis without hematuria: Secondary | ICD-10-CM | POA: Diagnosis present

## 2021-03-03 DIAGNOSIS — I890 Lymphedema, not elsewhere classified: Secondary | ICD-10-CM | POA: Diagnosis present

## 2021-03-03 DIAGNOSIS — K219 Gastro-esophageal reflux disease without esophagitis: Secondary | ICD-10-CM | POA: Diagnosis present

## 2021-03-03 DIAGNOSIS — R4189 Other symptoms and signs involving cognitive functions and awareness: Secondary | ICD-10-CM | POA: Diagnosis present

## 2021-03-03 DIAGNOSIS — I10 Essential (primary) hypertension: Secondary | ICD-10-CM | POA: Diagnosis present

## 2021-03-03 DIAGNOSIS — N390 Urinary tract infection, site not specified: Secondary | ICD-10-CM | POA: Diagnosis not present

## 2021-03-03 DIAGNOSIS — G9341 Metabolic encephalopathy: Secondary | ICD-10-CM | POA: Diagnosis present

## 2021-03-03 DIAGNOSIS — E876 Hypokalemia: Secondary | ICD-10-CM | POA: Diagnosis present

## 2021-03-03 DIAGNOSIS — M1990 Unspecified osteoarthritis, unspecified site: Secondary | ICD-10-CM | POA: Diagnosis present

## 2021-03-03 DIAGNOSIS — R159 Full incontinence of feces: Secondary | ICD-10-CM | POA: Diagnosis present

## 2021-03-03 DIAGNOSIS — E7800 Pure hypercholesterolemia, unspecified: Secondary | ICD-10-CM | POA: Diagnosis present

## 2021-03-03 DIAGNOSIS — G4733 Obstructive sleep apnea (adult) (pediatric): Secondary | ICD-10-CM | POA: Diagnosis present

## 2021-03-03 DIAGNOSIS — R600 Localized edema: Secondary | ICD-10-CM | POA: Diagnosis present

## 2021-03-03 DIAGNOSIS — G2581 Restless legs syndrome: Secondary | ICD-10-CM | POA: Diagnosis present

## 2021-03-03 DIAGNOSIS — D649 Anemia, unspecified: Secondary | ICD-10-CM | POA: Diagnosis present

## 2021-03-03 DIAGNOSIS — Z6841 Body Mass Index (BMI) 40.0 and over, adult: Secondary | ICD-10-CM | POA: Diagnosis not present

## 2021-03-03 DIAGNOSIS — U071 COVID-19: Secondary | ICD-10-CM | POA: Diagnosis present

## 2021-03-03 DIAGNOSIS — I872 Venous insufficiency (chronic) (peripheral): Secondary | ICD-10-CM | POA: Diagnosis present

## 2021-03-03 DIAGNOSIS — B9620 Unspecified Escherichia coli [E. coli] as the cause of diseases classified elsewhere: Secondary | ICD-10-CM | POA: Diagnosis present

## 2021-03-03 DIAGNOSIS — G9340 Encephalopathy, unspecified: Secondary | ICD-10-CM | POA: Diagnosis not present

## 2021-03-03 LAB — COMPREHENSIVE METABOLIC PANEL
ALT: 14 U/L (ref 0–44)
AST: 24 U/L (ref 15–41)
Albumin: 2.9 g/dL — ABNORMAL LOW (ref 3.5–5.0)
Alkaline Phosphatase: 71 U/L (ref 38–126)
Anion gap: 7 (ref 5–15)
BUN: 5 mg/dL — ABNORMAL LOW (ref 8–23)
CO2: 28 mmol/L (ref 22–32)
Calcium: 8.2 mg/dL — ABNORMAL LOW (ref 8.9–10.3)
Chloride: 104 mmol/L (ref 98–111)
Creatinine, Ser: 0.66 mg/dL (ref 0.44–1.00)
GFR, Estimated: >60 mL/min (ref >60)
Glucose, Bld: 81 mg/dL (ref 70–99)
Potassium: 3 mmol/L — ABNORMAL LOW (ref 3.5–5.1)
Sodium: 139 mmol/L (ref 135–145)
Total Bilirubin: 0.5 mg/dL (ref 0.3–1.2)
Total Protein: 6.1 g/dL — ABNORMAL LOW (ref 6.5–8.1)

## 2021-03-03 LAB — CBC WITH DIFFERENTIAL/PLATELET
Abs Immature Granulocytes: 0.01 10*3/uL (ref 0.00–0.07)
Basophils Absolute: 0 10*3/uL (ref 0.0–0.1)
Basophils Relative: 0 %
Eosinophils Absolute: 0.1 10*3/uL (ref 0.0–0.5)
Eosinophils Relative: 1 %
HCT: 30.7 % — ABNORMAL LOW (ref 36.0–46.0)
Hemoglobin: 10.2 g/dL — ABNORMAL LOW (ref 12.0–15.0)
Immature Granulocytes: 0 %
Lymphocytes Relative: 41 %
Lymphs Abs: 2.2 10*3/uL (ref 0.7–4.0)
MCH: 31 pg (ref 26.0–34.0)
MCHC: 33.2 g/dL (ref 30.0–36.0)
MCV: 93.3 fL (ref 80.0–100.0)
Monocytes Absolute: 0.5 10*3/uL (ref 0.1–1.0)
Monocytes Relative: 10 %
Neutro Abs: 2.6 10*3/uL (ref 1.7–7.7)
Neutrophils Relative %: 48 %
Platelets: 270 10*3/uL (ref 150–400)
RBC: 3.29 MIL/uL — ABNORMAL LOW (ref 3.87–5.11)
RDW: 14.6 % (ref 11.5–15.5)
WBC: 5.5 10*3/uL (ref 4.0–10.5)
nRBC: 0 % (ref 0.0–0.2)

## 2021-03-03 LAB — PHOSPHORUS: Phosphorus: 3.4 mg/dL (ref 2.5–4.6)

## 2021-03-03 LAB — D-DIMER, QUANTITATIVE: D-Dimer, Quant: 2.01 ug/mL-FEU — ABNORMAL HIGH (ref 0.00–0.50)

## 2021-03-03 LAB — HIV ANTIBODY (ROUTINE TESTING W REFLEX): HIV Screen 4th Generation wRfx: NONREACTIVE

## 2021-03-03 LAB — MAGNESIUM: Magnesium: 1.6 mg/dL — ABNORMAL LOW (ref 1.7–2.4)

## 2021-03-03 LAB — FERRITIN: Ferritin: 254 ng/mL (ref 11–307)

## 2021-03-03 LAB — URINE CULTURE: Culture: >=100000 — AB

## 2021-03-03 LAB — C-REACTIVE PROTEIN: CRP: 2.3 mg/dL — ABNORMAL HIGH (ref <1.0)

## 2021-03-03 LAB — RPR: RPR Ser Ql: NONREACTIVE

## 2021-03-03 MED ORDER — FOLIC ACID 5 MG/ML IJ SOLN
1.0000 mg | Freq: Every day | INTRAMUSCULAR | Status: DC
  Filled 2021-03-03: qty 0.2

## 2021-03-03 MED ORDER — MAGNESIUM SULFATE 2 GM/50ML IV SOLN
2.0000 g | Freq: Once | INTRAVENOUS | Status: AC
  Administered 2021-03-03: 2 g via INTRAVENOUS
  Filled 2021-03-03: qty 50

## 2021-03-03 MED ORDER — DIPHENHYDRAMINE HCL 50 MG/ML IJ SOLN
12.5000 mg | Freq: Once | INTRAMUSCULAR | Status: AC
  Administered 2021-03-03: 12.5 mg via INTRAVENOUS
  Filled 2021-03-03: qty 1

## 2021-03-03 MED ORDER — FOLIC ACID 1 MG PO TABS
1.0000 mg | ORAL_TABLET | Freq: Every day | ORAL | Status: DC
  Administered 2021-03-03 – 2021-03-11 (×9): 1 mg via ORAL
  Filled 2021-03-03 (×8): qty 1

## 2021-03-03 MED ORDER — CEPHALEXIN 500 MG PO CAPS
500.0000 mg | ORAL_CAPSULE | Freq: Four times a day (QID) | ORAL | Status: AC
  Administered 2021-03-03 – 2021-03-07 (×18): 500 mg via ORAL
  Filled 2021-03-03 (×18): qty 1

## 2021-03-03 NOTE — Progress Notes (Addendum)
PROGRESS NOTE  [name] GEX:[id number] DOB: [birth year] DOA: 03/01/2021 PCP: [name], MD  HPI/Recap of past 24 hours: 70 year old female with past medical history of lymphedema, morbid obesity, obstructive sleep apnea on CPAP and seizure who comes from a nursing facility with recent treatment for both UTI and COVID few days prior sent to the emergency room on 10/22 for acute encephalopathy including hallucinations.  Patient noted to have minimally elevated CRP and normal procalcitonin levels.  Phenobarbital level therapeutic I & D urinalysis notes still nitrite positive with many bacteria though only few white cells with urine culture pending.  Admitted to the hospitalist service.    Patient still confused and this morning, removed her IV.  However by midday, patient is a little bit more alert and appropriate.  She still has some confusion, and does seem to repeat words, but more focused certainly from the previous day.  Says she is feeling better and hopes to go home soon.   Assessment/Plan: Principal Problem:   Acute encephalopathy: Unclear etiology.  Suspect may be from infection.  Responding to IV Rocephin and overall seems improved from previous day.  Less likely seizure given therapeutic phenobarbital level.  CT scan of head negative.  Not hypoxic.  Supplemental care.  As needed Ativan Active Problems:   Restless legs syndrome (RLS)   Seizure disorder (HCC): No evidence of active seizure.  Phenobarbital level normal.  If confusion persists, may check EEG in next few days    Essential hypertension: Blood pressure has been stable.    GERD (gastroesophageal reflux disease)   OSA on CPAP: CPAP if patient becomes much more appropriate    Lymphedema of right lower extremity with lower extremity wound: Appreciate wound care help.    Hypokalemia/hypomagnesemia: Replacing as needed.  Have ordered 2 g of mag sulfate for today and then will replace potassium.    COVID-19 virus  infection: More like incidental finding.  CRP within normal limits and patient not hypoxic.  Continue to monitor closely.  Off of Remdisivir  Urinary tract infection: Cultures positive for greater than 100,000 colonies of E. coli, resistant to Cipro.  It is not known what antibiotic she was getting at her nursing facility.  She has been on Rocephin since being admitted.  Morbid obesity: Patient meets criteria BMI greater than 40  Code Status: Full code   Family Communication: Left message for family  Disposition Plan: Return to SNF once encephalopathy resolves, possibly in the next 1 to 2 days  Consultants: None   Procedures: None   Antimicrobials: IV Rocephin 10/22-present  DVT prophylaxis:  Lovenox  Level of care: Progressive   Objective: Vitals:   03/03/21 0602 03/03/21 0800  BP: (!) 125/58   Pulse: 80   Resp: 19 (!) 21  Temp: 98.2 F (36.8 C)   SpO2: 100%     Intake/Output Summary (Last 24 hours) at 03/03/2021 0933 Last data filed at 03/03/2021 0600 Gross per 24 hour  Intake 760 ml  Output 2000 ml  Net -1240 ml    Filed Weights   03/01/21 1856 03/01/21 2200 03/03/21 0602  Weight: 120.5 kg 120.7 kg 117.9 kg   Body mass index is 47.55 kg/m.  Exam:  General: Awake, oriented x2, no acute distress HEENT: Normocephalic and atraumatic, mucous membranes slightly dry Cardiovascular: Regular rate and rhythm, S1-S2 Respiratory: Clear to auscultation bilaterally Abdomen: Soft, obese, nontender, positive bowel sounds Musculoskeletal: 2+ pitting edema from the knees down bilaterally Skin: Chronic lymphedema with keratosis on  right lower extremity below the knee.  On left lower extremity she has an open wound approximately 2 inches across, now dressed. Psychiatry: A confusion although a little bit more focused from previous day Neurology: No overt deficits although acute delirium makes is difficult to assess.   Data Reviewed: CBC: Recent Labs  Lab  03/01/21 0650 03/01/21 0754 03/02/21 0512 03/03/21 0432  WBC 6.9  --  5.1 5.5  NEUTROABS 4.2  --  2.7 2.6  HGB 11.2* 9.9* 9.7* 10.2*  HCT 33.3* 29.0* 30.1* 30.7*  MCV 92.0  --  95.6 93.3  PLT 310  --  271 270    Basic Metabolic Panel: Recent Labs  Lab 03/01/21 0650 03/01/21 0754 03/02/21 0512 03/03/21 0432  NA 136 137 140 139  K 3.2* 3.1* 3.0* 3.0*  CL 97*  --  106 104  CO2 31  --  27 28  GLUCOSE 87  --  82 81  BUN 15  --  8 5*  CREATININE 0.95  --  0.71 0.66  CALCIUM 9.7  --  8.6* 8.2*  MG 1.8  --  1.9 1.6*  PHOS 3.1  --  2.7 3.4    GFR: Estimated Creatinine Clearance: 80.9 mL/min (by C-G formula based on SCr of 0.66 mg/dL). Liver Function Tests: Recent Labs  Lab 03/01/21 0650 03/02/21 0512 03/03/21 0432  AST 18 20 24   ALT 15 16 14   ALKPHOS 86 77 71  BILITOT 0.5 0.5 0.5  PROT 6.9 6.3* 6.1*  ALBUMIN 3.7 2.9* 2.9*    No results for input(s): LIPASE, AMYLASE in the last 168 hours. Recent Labs  Lab 03/01/21 0650 03/02/21 1827  AMMONIA 23 38*    Coagulation Profile: No results for input(s): INR, PROTIME in the last 168 hours. Cardiac Enzymes: No results for input(s): CKTOTAL, CKMB, CKMBINDEX, TROPONINI in the last 168 hours. BNP (last 3 results) No results for input(s): PROBNP in the last 8760 hours. HbA1C: No results for input(s): HGBA1C in the last 72 hours. CBG: No results for input(s): GLUCAP in the last 168 hours. Lipid Profile: No results for input(s): CHOL, HDL, LDLCALC, TRIG, CHOLHDL, LDLDIRECT in the last 72 hours. Thyroid Function Tests: Recent Labs    03/02/21 1827  TSH 2.625    Anemia Panel: Recent Labs    03/02/21 0512 03/02/21 1827 03/03/21 0432  VITAMINB12  --  311  --   FOLATE  --  3.5*  --   FERRITIN 250  --  254    Urine analysis:    Component Value Date/Time   COLORURINE YELLOW 03/01/2021 0756   APPEARANCEUR CLEAR 03/01/2021 0756   LABSPEC 1.008 03/01/2021 0756   PHURINE 5.5 03/01/2021 0756   GLUCOSEU NEGATIVE  03/01/2021 0756   HGBUR NEGATIVE 03/01/2021 0756   BILIRUBINUR NEGATIVE 03/01/2021 0756   KETONESUR NEGATIVE 03/01/2021 0756   PROTEINUR NEGATIVE 03/01/2021 0756   NITRITE POSITIVE (A) 03/01/2021 0756   LEUKOCYTESUR SMALL (A) 03/01/2021 0756   Sepsis Labs: @LABRCNTIP (procalcitonin:4,lacticidven:4)  ) Recent Results (from the past 240 hour(s))  Culture, blood (routine x 2)     Status: None (Preliminary result)   Collection Time: 03/01/21  6:40 AM   Specimen: BLOOD  Result Value Ref Range Status   Specimen Description   Final    BLOOD BOTTLES DRAWN AEROBIC AND ANAEROBIC Performed at [facility], [address], [place], 03/03/21 [address]    Special Requests   Final    Blood Culture adequate volume LEFT ANTECUBITAL Performed at  [facility], [address] [place], [place] [address]    Culture   Final    NO GROWTH 2 DAYS Performed at [facility], [address], [place], [place] [address]    Report Status PENDING  Incomplete  Culture, blood (routine x 2)     Status: None (Preliminary result)   Collection Time: 03/01/21  6:45 AM   Specimen: BLOOD  Result Value Ref Range Status   Specimen Description   Final    BLOOD BOTTLES DRAWN AEROBIC ONLY Performed at [facility], [address], [place], [place] [address]    Special Requests   Final    Blood Culture results may not be optimal due to an inadequate volume of blood received in culture bottles BLOOD RIGHT FOREARM Performed at [facility], [address], [place], [place] [address]    Culture   Final    NO GROWTH 2 DAYS Performed at [facility], [address] [place], [place] [address]    Report Status PENDING  Incomplete  Resp Panel by RT-PCR (Flu A&B, Covid) Nasopharyngeal Swab     Status: Abnormal   Collection Time: 03/01/21  7:56 AM   Specimen: Nasopharyngeal Swab; Nasopharyngeal(NP) swabs in vial transport medium   Result Value Ref Range Status   SARS Coronavirus 2 by RT PCR POSITIVE (A) NEGATIVE Final    Comment: RESULT CALLED TO, READ BACK BY AND VERIFIED WITH: Cleora Fleet 0093 03/01/2021 DBRADLEY (NOTE) SARS-CoV-2 target nucleic acids are DETECTED.  The SARS-CoV-2 RNA is generally detectable in upper respiratory specimens during the acute phase of infection. Positive results are indicative of the presence of the identified virus, but do not rule out bacterial infection or co-infection with other pathogens not detected by the test. Clinical correlation with patient history and other diagnostic information is necessary to determine patient infection status. The expected result is Negative.  Fact Sheet for Patients: BloggerCourse.com  Fact Sheet for Healthcare Providers: SeriousBroker.it  This test is not yet approved or cleared by the [place] FDA and  has been authorized for detection and/or diagnosis of SARS-CoV-2 by FDA under an Emergency Use Authorization (EUA).  This EUA will remain in effect (meaning this test c an be used) for the duration of  the COVID-19 declaration under Section 564(b)(1) of the Act, 21 U.S.C. section 360bbb-3(b)(1), unless the authorization is terminated or revoked sooner.     Influenza A by PCR NEGATIVE NEGATIVE Final   Influenza B by PCR NEGATIVE NEGATIVE Final    Comment: (NOTE) The Xpert Xpress SARS-CoV-2/FLU/RSV plus assay is intended as an aid in the diagnosis of influenza from Nasopharyngeal swab specimens and should not be used as a sole basis for treatment. Nasal washings and aspirates are unacceptable for Xpert Xpress SARS-CoV-2/FLU/RSV testing.  Fact Sheet for Patients: BloggerCourse.com  Fact Sheet for Healthcare Providers: SeriousBroker.it  This test is not yet approved or cleared by the [place] FDA and has been authorized for  detection and/or diagnosis of SARS-CoV-2 by FDA under an Emergency Use Authorization (EUA). This EUA will remain in effect (meaning this test can be used) for the duration of the COVID-19 declaration under Section 564(b)(1) of the Act, 21 U.S.C. section 360bbb-3(b)(1), unless the authorization is terminated or revoked.  Performed at [facility], [address], [place], [place] [address]   Urine Culture     Status: Abnormal   Collection Time: 03/01/21  7:56 AM   Specimen: Urine, Clean Catch  Result Value Ref Range Status   Specimen Description   Final    URINE, CLEAN CATCH Performed at [facility], [address] [place], [place] [address]    Special Requests   Final    NONE Performed at [facility], [address], [place], [place] [address]    Culture >=100,000 COLONIES/mL ESCHERICHIA COLI (A)  Final   Report Status 03/03/2021 FINAL  Final   Organism ID, Bacteria ESCHERICHIA COLI (A)  Final      Susceptibility   Escherichia coli - MIC*    AMPICILLIN 16 INTERMEDIATE Intermediate     CEFAZOLIN <=4 SENSITIVE Sensitive     CEFEPIME <=0.12 SENSITIVE Sensitive     CEFTRIAXONE <=0.25 SENSITIVE Sensitive     CIPROFLOXACIN >=4 RESISTANT Resistant     GENTAMICIN <=1 SENSITIVE Sensitive     IMIPENEM <=0.25 SENSITIVE Sensitive     NITROFURANTOIN <=16 SENSITIVE Sensitive     TRIMETH/SULFA >=320 RESISTANT Resistant     AMPICILLIN/SULBACTAM <=2 SENSITIVE Sensitive     PIP/TAZO <=4 SENSITIVE Sensitive     * >=100,000 COLONIES/mL ESCHERICHIA COLI      Studies: No results found.  Scheduled Meds:  vitamin C  500 mg Oral Daily   enoxaparin (LOVENOX) injection  60 mg Subcutaneous Q24H   folic acid  1 mg Intravenous Daily   hydrocerin   Topical Daily   montelukast  10 mg Oral QHS   pantoprazole  40 mg Oral Daily   PHENobarbital  129.6 mg Oral QHS   Pramipexole Dihydrochloride  1 tablet Oral QHS   rosuvastatin  20 mg  Oral Daily   sodium chloride flush  3 mL Intravenous Q12H   zinc sulfate  220 mg Oral Daily    Continuous Infusions:  cefTRIAXone (ROCEPHIN)  IV Stopped (03/02/21 1036)   magnesium sulfate bolus IVPB     remdesivir 100 mg in NS 100 mL Stopped (03/02/21 1351)     LOS: 0 days     Hollice Espy, MD [facility]   03/03/2021, 9:33 AM

## 2021-03-03 NOTE — Progress Notes (Signed)
Patient received this AM - patient pulled out IV and pulled off tele box - patient was in mittens but had pulled those off as well.  Patient disoriented x4; will updated medical team for further management.

## 2021-03-03 NOTE — Progress Notes (Signed)
Placed patient on CPAP. Before I could get out of the room patient removed CPAP. RN made aware.

## 2021-03-04 DIAGNOSIS — N3000 Acute cystitis without hematuria: Secondary | ICD-10-CM

## 2021-03-04 DIAGNOSIS — G9341 Metabolic encephalopathy: Principal | ICD-10-CM

## 2021-03-04 DIAGNOSIS — U071 COVID-19: Secondary | ICD-10-CM | POA: Diagnosis not present

## 2021-03-04 LAB — COMPREHENSIVE METABOLIC PANEL
ALT: 14 U/L (ref 0–44)
AST: 19 U/L (ref 15–41)
Albumin: 3 g/dL — ABNORMAL LOW (ref 3.5–5.0)
Alkaline Phosphatase: 81 U/L (ref 38–126)
Anion gap: 9 (ref 5–15)
BUN: 6 mg/dL — ABNORMAL LOW (ref 8–23)
CO2: 29 mmol/L (ref 22–32)
Calcium: 8.6 mg/dL — ABNORMAL LOW (ref 8.9–10.3)
Chloride: 102 mmol/L (ref 98–111)
Creatinine, Ser: 0.7 mg/dL (ref 0.44–1.00)
GFR, Estimated: >60 mL/min (ref >60)
Glucose, Bld: 93 mg/dL (ref 70–99)
Potassium: 3 mmol/L — ABNORMAL LOW (ref 3.5–5.1)
Sodium: 140 mmol/L (ref 135–145)
Total Bilirubin: 0.5 mg/dL (ref 0.3–1.2)
Total Protein: 6.4 g/dL — ABNORMAL LOW (ref 6.5–8.1)

## 2021-03-04 LAB — CBC WITH DIFFERENTIAL/PLATELET
Abs Immature Granulocytes: 0.02 10*3/uL (ref 0.00–0.07)
Basophils Absolute: 0 10*3/uL (ref 0.0–0.1)
Basophils Relative: 0 %
Eosinophils Absolute: 0.1 10*3/uL (ref 0.0–0.5)
Eosinophils Relative: 2 %
HCT: 34 % — ABNORMAL LOW (ref 36.0–46.0)
Hemoglobin: 11 g/dL — ABNORMAL LOW (ref 12.0–15.0)
Immature Granulocytes: 0 %
Lymphocytes Relative: 38 %
Lymphs Abs: 1.9 10*3/uL (ref 0.7–4.0)
MCH: 30.8 pg (ref 26.0–34.0)
MCHC: 32.4 g/dL (ref 30.0–36.0)
MCV: 95.2 fL (ref 80.0–100.0)
Monocytes Absolute: 0.4 10*3/uL (ref 0.1–1.0)
Monocytes Relative: 8 %
Neutro Abs: 2.6 10*3/uL (ref 1.7–7.7)
Neutrophils Relative %: 52 %
Platelets: 255 10*3/uL (ref 150–400)
RBC: 3.57 MIL/uL — ABNORMAL LOW (ref 3.87–5.11)
RDW: 14.7 % (ref 11.5–15.5)
WBC: 5.1 10*3/uL (ref 4.0–10.5)
nRBC: 0 % (ref 0.0–0.2)

## 2021-03-04 LAB — PHOSPHORUS: Phosphorus: 3.1 mg/dL (ref 2.5–4.6)

## 2021-03-04 LAB — FERRITIN: Ferritin: 255 ng/mL (ref 11–307)

## 2021-03-04 LAB — AMMONIA: Ammonia: 43 umol/L — ABNORMAL HIGH (ref 9–35)

## 2021-03-04 LAB — C-REACTIVE PROTEIN: CRP: 1.7 mg/dL — ABNORMAL HIGH (ref <1.0)

## 2021-03-04 LAB — MAGNESIUM: Magnesium: 1.9 mg/dL (ref 1.7–2.4)

## 2021-03-04 LAB — D-DIMER, QUANTITATIVE: D-Dimer, Quant: 1.74 ug/mL-FEU — ABNORMAL HIGH (ref 0.00–0.50)

## 2021-03-04 MED ORDER — LORAZEPAM 1 MG PO TABS
1.0000 mg | ORAL_TABLET | Freq: Once | ORAL | Status: AC
  Administered 2021-03-04: 1 mg via ORAL
  Filled 2021-03-04: qty 1

## 2021-03-04 MED ORDER — POTASSIUM CHLORIDE CRYS ER 20 MEQ PO TBCR
40.0000 meq | EXTENDED_RELEASE_TABLET | ORAL | Status: AC
  Administered 2021-03-04 (×2): 40 meq via ORAL
  Filled 2021-03-04 (×2): qty 2

## 2021-03-04 MED ORDER — VITAMIN B-12 1000 MCG PO TABS
1000.0000 ug | ORAL_TABLET | Freq: Every day | ORAL | Status: DC
  Administered 2021-03-04 – 2021-03-11 (×8): 1000 ug via ORAL
  Filled 2021-03-04 (×8): qty 1

## 2021-03-04 MED ORDER — HALOPERIDOL LACTATE 5 MG/ML IJ SOLN
5.0000 mg | Freq: Once | INTRAMUSCULAR | Status: AC
  Administered 2021-03-04: 5 mg via INTRAMUSCULAR
  Filled 2021-03-04: qty 1

## 2021-03-04 MED ORDER — LACTULOSE 10 GM/15ML PO SOLN
20.0000 g | Freq: Two times a day (BID) | ORAL | Status: DC
  Administered 2021-03-04 – 2021-03-07 (×6): 20 g via ORAL
  Filled 2021-03-04 (×7): qty 30

## 2021-03-04 NOTE — Assessment & Plan Note (Signed)
Continue Protonix °

## 2021-03-04 NOTE — Assessment & Plan Note (Signed)
Continue pramipexole

## 2021-03-04 NOTE — Assessment & Plan Note (Signed)
-   Continue phenobarbital - Drug level normal on admission, 19.4

## 2021-03-04 NOTE — Assessment & Plan Note (Signed)
-   Stable and chronic.  History of lymphedema

## 2021-03-04 NOTE — Assessment & Plan Note (Addendum)
-   Continue nightly CPAP but patient typically refuses

## 2021-03-04 NOTE — Assessment & Plan Note (Addendum)
-   differential includes infection (UTI vs covid) vs delirium vs low drug levels (therapeutic phenobarb level on admission) vs neurocognitive impairment -Urine culture growing E. coli, continue Keflex; previously on Rocephin; discontinue antibiotics after 7 days - re-orient as able and needed - ammonia has been uptrending (38 >> 43) with questionable asterixis on exam 10/25; LFTs okay; trial of lactulose; ammonia normalized on 03/06/2021 but confusion continued -HIV, TSH, and RPR unremarkable - Folate level low, 3.5.  Continue replacement -B12 level low normal, 311.  Start replacement -CT head does show multifocal areas of encephalomalacia involving right frontal, right occipital, right posterior parietal, and bilateral anterior temporal lobes.  It is possible that underlying encephalomalacia is contributing to her generalized dementia like symptoms as we have ruled out most other major etiologies and treated reversible causes; furthermore, husband states that patient has been in this mentation for approximately 1 month with not much improvement -Discussed with neurology on 03/10/2021.  Outpatient appointment to be arranged for follow-up for further neurocognitive testing

## 2021-03-04 NOTE — Plan of Care (Signed)
  Problem: Clinical Measurements: Goal: Ability to maintain clinical measurements within normal limits will improve Outcome: Progressing   Problem: Clinical Measurements: Goal: Diagnostic test results will improve Outcome: Progressing   Problem: Coping: Goal: Level of anxiety will decrease Outcome: Progressing   

## 2021-03-04 NOTE — Assessment & Plan Note (Signed)
-   Urine culture growing E. coli, resistant to Cipro - Started on Rocephin on admission and has been transitioned to Keflex to complete course

## 2021-03-04 NOTE — Assessment & Plan Note (Addendum)
-   s/p 2 days Remdesivir treatment - currently no respiratory symptoms.  - remained stable with ongoing monitoring

## 2021-03-04 NOTE — Assessment & Plan Note (Addendum)
-   continue supplementation at d/c

## 2021-03-04 NOTE — Hospital Course (Addendum)
[name] is a 70 yo female with PMH lower extremity lymphedema, OSA, obesity, seizure disorder who presented from [facility] due to AMS.  She was previously being treated for a UTI and COVID approximately 5 days prior to admission.  Due to worsening confusion, she was sent to the hospital for further evaluation. She was started on Rocephin for treatment of UTI and admitted for further work-up.  She was de-escalated to Keflex to complete her course in the hospital. See below for further A&P.

## 2021-03-04 NOTE — Plan of Care (Signed)

## 2021-03-04 NOTE — Progress Notes (Signed)
Progress Note    BRYLYN NOVAKOVICH   [id number]:[id number]  DOB: [birth year]  DOA: 03/01/2021     1 Date of Service: 03/04/2021   Clinical Course Ms. Ferrante is a 70 yo female with PMH lower extremity lymphedema, OSA, obesity, seizure disorder who presented from [place] nursing home due to AMS.  She was previously being treated for a UTI and COVID approximately 5 days prior to admission.  Due to worsening confusion, she was sent to the hospital for further evaluation. She was started on Rocephin for treatment of UTI and admitted for further work-up.   Assessment and Plan * Acute metabolic encephalopathy - differential includes infection (UTI vs covid) vs delirium vs low drug levels (therapeutic phenobarb level on admission) vs other -Urine culture growing E. coli, continue Keflex; previously on Rocephin - re-orient as able and needed - ammonia has been uptrending (38 >> 43) with questionable asterixis on exam today; LFTs okay; trial of lactulose  -HIV, TSH, and RPR unremarkable - Folate level low, 3.5.  Continue replacement -B12 level low normal, 311.  Start replacement  Acute cystitis - Urine culture growing E. coli, resistant to Cipro - Started on Rocephin on admission and has been transitioned to Keflex to complete course  COVID-19 virus infection - s/p 2 days Remdesivir treatment - currently no respiratory symptoms. Continue to monitor off treatment - trend inflammatory markers   Seizure disorder (HCC) - Continue phenobarbital - Drug level normal on admission, 19.4  Essential hypertension - Controlled without medication  Hypokalemia Repleting as needed  OSA on CPAP - Continue nightly CPAP  GERD (gastroesophageal reflux disease) - Continue Protonix  Bilateral lower extremity edema - Stable and chronic.  History of lymphedema  Restless legs syndrome (RLS) - Continue pramipexole     Subjective:  Patient was agitated this morning requiring dose of Haldol IM.  She  remains intermittently altered.  Unable to follow any of my commands during assessment this morning.  Objective Vitals:   03/03/21 2003 03/04/21 0636 03/04/21 0645 03/04/21 1440  BP: (!) 131/59 133/71  133/65  Pulse: 81 95  62  Resp: 20 20  12   Temp: 98.9 F (37.2 C) 98.6 F (37 C)  98.9 F (37.2 C)  TempSrc: Oral Oral  Oral  SpO2: 98% 97%  (!) 88%  Weight:   116.9 kg   Height:       116.9 kg  Vital signs were reviewed and unremarkable.   Exam Physical Exam Constitutional:      Comments: Chronically ill-appearing adult woman laying in bed in no distress but unable to follow commands  HENT:     Head: Normocephalic and atraumatic.     Mouth/Throat:     Mouth: Mucous membranes are moist.  Eyes:     Extraocular Movements: Extraocular movements intact.  Cardiovascular:     Rate and Rhythm: Normal rate and regular rhythm.  Pulmonary:     Effort: Pulmonary effort is normal. No respiratory distress.     Breath sounds: Normal breath sounds. No wheezing.  Abdominal:     General: Bowel sounds are normal. There is no distension.     Palpations: Abdomen is soft.     Tenderness: There is no abdominal tenderness.  Musculoskeletal:        General: Normal range of motion.     Cervical back: Neck supple.  Skin:    General: Skin is warm and dry.  Neurological:     Comments: Moves all 4 extremities, does not follow  commands     Labs / Other Information My review of labs, imaging, notes and other tests shows no new significant findings.    Disposition Plan: Status is: Inpatient  Remains inpatient appropriate because: Ongoing treatment and work-up for encephalopathy   Time spent: Greater than 50% of the 35 minute visit was spent in counseling/coordination of care for the patient as laid out in the A&P.  Lewie Chamber, MD [facility] 03/04/2021, 3:40 PM

## 2021-03-04 NOTE — Assessment & Plan Note (Signed)
Controlled without medication

## 2021-03-05 DIAGNOSIS — N3000 Acute cystitis without hematuria: Secondary | ICD-10-CM | POA: Diagnosis not present

## 2021-03-05 DIAGNOSIS — G9341 Metabolic encephalopathy: Secondary | ICD-10-CM | POA: Diagnosis not present

## 2021-03-05 LAB — C-REACTIVE PROTEIN: CRP: 1.9 mg/dL — ABNORMAL HIGH (ref <1.0)

## 2021-03-05 LAB — CBC WITH DIFFERENTIAL/PLATELET
Abs Immature Granulocytes: 0.01 10*3/uL (ref 0.00–0.07)
Basophils Absolute: 0 10*3/uL (ref 0.0–0.1)
Basophils Relative: 1 %
Eosinophils Absolute: 0.1 10*3/uL (ref 0.0–0.5)
Eosinophils Relative: 1 %
HCT: 32.8 % — ABNORMAL LOW (ref 36.0–46.0)
Hemoglobin: 10.8 g/dL — ABNORMAL LOW (ref 12.0–15.0)
Immature Granulocytes: 0 %
Lymphocytes Relative: 35 %
Lymphs Abs: 2 10*3/uL (ref 0.7–4.0)
MCH: 31.4 pg (ref 26.0–34.0)
MCHC: 32.9 g/dL (ref 30.0–36.0)
MCV: 95.3 fL (ref 80.0–100.0)
Monocytes Absolute: 0.5 10*3/uL (ref 0.1–1.0)
Monocytes Relative: 8 %
Neutro Abs: 3.2 10*3/uL (ref 1.7–7.7)
Neutrophils Relative %: 55 %
Platelets: 252 10*3/uL (ref 150–400)
RBC: 3.44 MIL/uL — ABNORMAL LOW (ref 3.87–5.11)
RDW: 14.6 % (ref 11.5–15.5)
WBC: 5.7 10*3/uL (ref 4.0–10.5)
nRBC: 0 % (ref 0.0–0.2)

## 2021-03-05 LAB — PHOSPHORUS: Phosphorus: 2.4 mg/dL — ABNORMAL LOW (ref 2.5–4.6)

## 2021-03-05 LAB — COMPREHENSIVE METABOLIC PANEL
ALT: 13 U/L (ref 0–44)
AST: 17 U/L (ref 15–41)
Albumin: 3 g/dL — ABNORMAL LOW (ref 3.5–5.0)
Alkaline Phosphatase: 79 U/L (ref 38–126)
Anion gap: 7 (ref 5–15)
BUN: 8 mg/dL (ref 8–23)
CO2: 26 mmol/L (ref 22–32)
Calcium: 8.8 mg/dL — ABNORMAL LOW (ref 8.9–10.3)
Chloride: 107 mmol/L (ref 98–111)
Creatinine, Ser: 0.63 mg/dL (ref 0.44–1.00)
GFR, Estimated: >60 mL/min (ref >60)
Glucose, Bld: 93 mg/dL (ref 70–99)
Potassium: 3.4 mmol/L — ABNORMAL LOW (ref 3.5–5.1)
Sodium: 140 mmol/L (ref 135–145)
Total Bilirubin: 0.8 mg/dL (ref 0.3–1.2)
Total Protein: 6.4 g/dL — ABNORMAL LOW (ref 6.5–8.1)

## 2021-03-05 LAB — LACTATE DEHYDROGENASE: LDH: 155 U/L (ref 98–192)

## 2021-03-05 LAB — FERRITIN: Ferritin: 239 ng/mL (ref 11–307)

## 2021-03-05 LAB — AMMONIA: Ammonia: 43 umol/L — ABNORMAL HIGH (ref 9–35)

## 2021-03-05 LAB — MAGNESIUM: Magnesium: 1.6 mg/dL — ABNORMAL LOW (ref 1.7–2.4)

## 2021-03-05 LAB — D-DIMER, QUANTITATIVE: D-Dimer, Quant: 1.75 ug/mL-FEU — ABNORMAL HIGH (ref 0.00–0.50)

## 2021-03-05 MED ORDER — ZINC OXIDE 40 % EX OINT
TOPICAL_OINTMENT | CUTANEOUS | Status: DC | PRN
  Filled 2021-03-05 (×2): qty 57

## 2021-03-05 MED ORDER — POTASSIUM PHOSPHATES 15 MMOLE/5ML IV SOLN: 20.0000 mmol | Freq: Once | INTRAVENOUS | Status: DC

## 2021-03-05 MED ORDER — HALOPERIDOL LACTATE 5 MG/ML IJ SOLN
1.0000 mg | Freq: Once | INTRAMUSCULAR | Status: AC
  Administered 2021-03-05: 1 mg via INTRAMUSCULAR
  Filled 2021-03-05: qty 1

## 2021-03-05 MED ORDER — MAGNESIUM SULFATE 2 GM/50ML IV SOLN: 2.0000 g | Freq: Once | INTRAVENOUS | Status: DC

## 2021-03-05 MED ORDER — POTASSIUM CHLORIDE CRYS ER 20 MEQ PO TBCR
40.0000 meq | EXTENDED_RELEASE_TABLET | Freq: Once | ORAL | Status: AC
  Administered 2021-03-05: 40 meq via ORAL
  Filled 2021-03-05: qty 2

## 2021-03-05 MED ORDER — K PHOS MONO-SOD PHOS DI & MONO 155-852-130 MG PO TABS
500.0000 mg | ORAL_TABLET | Freq: Once | ORAL | Status: AC
  Administered 2021-03-05: 500 mg via ORAL
  Filled 2021-03-05: qty 2

## 2021-03-05 MED ORDER — MAGNESIUM OXIDE -MG SUPPLEMENT 400 (240 MG) MG PO TABS
800.0000 mg | ORAL_TABLET | Freq: Once | ORAL | Status: AC
  Administered 2021-03-05: 800 mg via ORAL
  Filled 2021-03-05: qty 2

## 2021-03-05 MED ORDER — HALOPERIDOL LACTATE 5 MG/ML IJ SOLN
2.0000 mg | Freq: Once | INTRAMUSCULAR | Status: AC
  Administered 2021-03-05: 2 mg via INTRAMUSCULAR
  Filled 2021-03-05: qty 1

## 2021-03-05 MED ORDER — LORAZEPAM 0.5 MG PO TABS
0.5000 mg | ORAL_TABLET | ORAL | Status: DC | PRN
  Administered 2021-03-05 – 2021-03-07 (×6): 0.5 mg via ORAL
  Filled 2021-03-05 (×6): qty 1

## 2021-03-05 NOTE — Progress Notes (Signed)
At this time, patient is confused and wearing mittens on both hands. Therefore, for safety purposes, CPAP will be held at this time. RN is aware and agrees to call RT with changes.

## 2021-03-05 NOTE — Plan of Care (Signed)

## 2021-03-05 NOTE — TOC Initial Note (Signed)
Transition of Care [facility]) - Initial/Assessment Note    Patient Details  Name: [name] MRN: [id number] Date of Birth: August 10, 1950  Transition [facility] Care [facility]) CM/SW Contact:    Golda Acre, RN Phone Number: 03/05/2021, 8:57 AM  Clinical Narrative:                 70 yo female with PMH lower extremity lymphedema, OSA, obesity, seizure disorder who presented from [place] nursing home due to AMS.  She was previously being treated for a UTI and COVID approximately 5 days prior to admission.  Due to worsening confusion, she was sent to the hospital for further evaluation. She was started on Rocephin for treatment of UTI and admitted for further work-up.    Assessment and Plan * Acute metabolic encephalopathy - differential includes infection (UTI vs covid) vs delirium vs low drug levels (therapeutic phenobarb level on admission) vs other -Urine culture growing E. coli, continue Keflex; previously on Rocephin - re-orient as able and needed - ammonia has been uptrending (38 >> 43) with questionable asterixis on exam today; LFTs okay; trial of lactulose  -HIV, TSH, and RPR unremarkable - Folate level low, 3.5.  Continue replacement -B12 level low normal, 311.  Start replacement   Acute cystitis - Urine culture growing E. coli, resistant to Cipro - Started on Rocephin on admission and has been transitioned to Keflex to complete course   COVID-19 virus infection - s/p 2 days Remdesivir treatment - currently no respiratory symptoms. Continue to monitor off treatment - trend inflammatory markers    Seizure disorder (HCC) - Continue phenobarbital - Drug level normal on admission, 19.4   Essential hypertension - Controlled without medication   Hypokalemia Repleting as needed   OSA on CPAP - Continue nightly CPAP   GERD (gastroesophageal reflux disease) - Continue Protonix   Bilateral lower extremity edema - Stable and chronic.  History of lymphedema   Restless legs  syndrome (RLS) - Continue pramipexole   TOC PLAN OF CARE: To return to home with self care once stable.  Following for progression. Expected Discharge Plan: Home/Self Care Barriers to Discharge: Continued Medical Work up   Patient Goals and CMS Choice Patient states their goals for this hospitalization and ongoing recovery are:: to go home CMS Medicare.gov Compare Post Acute Care list provided to:: Patient    Expected Discharge Plan and Services Expected Discharge Plan: Home/Self Care   Discharge Planning Services: CM Consult   Living arrangements for the past 2 months: Single Family Home Expected Discharge Date:  (unknown)                                    Prior Living Arrangements/Services Living arrangements for the past 2 months: Single Family Home Lives with:: Spouse Patient language and need for interpreter reviewed:: Yes Do you feel safe going back to the place where you live?: Yes            Criminal Activity/Legal Involvement Pertinent to Current Situation/Hospitalization: No - Comment as needed  Activities of Daily Living Home Assistive Devices/Equipment: Blood pressure cuff, Eyeglasses, Grab bars around toilet, Grab bars in shower, Hand-held shower hose, Hospital bed, Walker (specify type), Wheelchair, Scales, CPAP ADL Screening (condition at time of admission) Patient's cognitive ability adequate to safely complete daily activities?: No Is the patient deaf or have difficulty hearing?: No Does the patient have difficulty seeing, even when wearing glasses/contacts?:  No Does the patient have difficulty concentrating, remembering, or making decisions?: Yes Patient able to express need for assistance with ADLs?: Yes Does the patient have difficulty dressing or bathing?: Yes Independently performs ADLs?: No Communication: Independent Dressing (OT): Needs assistance Is this a change from baseline?: Pre-admission baseline Grooming: Needs assistance Is this  a change from baseline?: Pre-admission baseline Feeding: Needs assistance Is this a change from baseline?: Pre-admission baseline Bathing: Needs assistance Is this a change from baseline?: Pre-admission baseline Toileting: Needs assistance Is this a change from baseline?: Pre-admission baseline In/Out Bed: Needs assistance Is this a change from baseline?: Pre-admission baseline Walks in Home: Needs assistance Is this a change from baseline?: Pre-admission baseline Does the patient have difficulty walking or climbing stairs?: Yes Weakness of Legs: Both Weakness of Arms/Hands: None  Permission Sought/Granted                  Emotional Assessment Appearance:: Appears stated age     Orientation: : Oriented to Self, Oriented to Place, Oriented to  Time, Oriented to Situation Alcohol / Substance Use: Not Applicable Psych Involvement: No (comment)  Admission diagnosis:  Lymphedema [I89.0] Normochromic normocytic anemia [D64.9] Altered mental status, unspecified altered mental status type [R41.82] COVID-19 virus infection [U07.1] Acute metabolic encephalopathy [G93.41] Patient Active Problem List   Diagnosis Date Noted   Acute cystitis 03/04/2021   Acute metabolic encephalopathy 03/03/2021   COVID-19 virus infection 03/01/2021   Cellulitis and abscess of foot 02/07/2021   Acute renal failure (ARF) (HCC) 01/19/2021   ARF (acute renal failure) (HCC) 01/18/2021   SIRS (systemic inflammatory response syndrome) (HCC) 01/09/2021   Acute encephalopathy 01/09/2021   Hyponatremia 12/20/2018   Cellulitis 12/20/2018   Cellulitis of left lower extremity 12/19/2018   Hypokalemia 02/17/2017   DOE (dyspnea on exertion) 01/08/2017   Medication management 01/08/2017   Weight gain 01/08/2017   Lymphedema of right lower extremity 10/22/2016   Wound dehiscence, surgical, subsequent encounter 05/12/2016   S/P ankle arthrodesis 03/12/2016   OSA on CPAP 09/10/2015   Nocturia more than twice  per night 09/10/2015   Morbid obesity due to excess calories (HCC) 09/10/2015   Loss of consciousness (HCC) 08/15/2015   Fracture of ankle, trimalleolar, closed 07/03/2015   Syncope 07/02/2015   Seizure disorder (HCC) 07/02/2015   Essential hypertension 07/02/2015   Bilateral lower extremity edema 07/02/2015   Closed right ankle fracture 07/02/2015   GERD (gastroesophageal reflux disease) 07/02/2015   HLD (hyperlipidemia) 07/02/2015   Anosmia/chronic 07/02/2015   Leukocytosis 07/02/2015   Restless legs syndrome (RLS) 04/28/2013   Sleep apnea with use of continuous positive airway pressure (CPAP) 01/24/2013   PCP:  [name], MD Pharmacy:   [id number] [place], [place] - [address] [place] [address] Phone: [phone] Fax: [phone]     Social Determinants of Health (SDOH) Interventions    Readmission Risk Interventions Readmission Risk Prevention Plan 03/04/2021  Transportation Screening Complete  Medication Review (RN Care Manager) Complete  PCP or Specialist appointment within 3-5 days of discharge Complete  HRI or Home Care Consult Complete  SW Recovery Care/Counseling Consult Complete  Palliative Care Screening Not Applicable  Skilled Nursing Facility Not Applicable  Some recent data might be hidden

## 2021-03-05 NOTE — Progress Notes (Signed)
Progress Note    ZENDA HERSKOWITZ   XLK:[id number]  DOB: December 04, 1950  DOA: 03/01/2021     2 Date of Service: 03/05/2021   Clinical Course [name] is a 70 yo female with PMH lower extremity lymphedema, OSA, obesity, seizure disorder who presented from [place] nursing home due to AMS.  She was previously being treated for a UTI and COVID approximately 5 days prior to admission.  Due to worsening confusion, she was sent to the hospital for further evaluation. She was started on Rocephin for treatment of UTI and admitted for further work-up.    Assessment and Plan * Acute metabolic encephalopathy - differential includes infection (UTI vs covid) vs delirium vs low drug levels (therapeutic phenobarb level on admission) vs other -Urine culture growing E. coli, continue Keflex; previously on Rocephin - re-orient as able and needed - ammonia has been uptrending (38 >> 43) with questionable asterixis on exam 10/25; LFTs okay; trial of lactulose  -HIV, TSH, and RPR unremarkable - Folate level low, 3.5.  Continue replacement -B12 level low normal, 311.  Start replacement -No significant improvement compared to yesterday.  Acute cystitis - Urine culture growing E. coli, resistant to Cipro - Started on Rocephin on admission and has been transitioned to Keflex to complete course  COVID-19 virus infection - s/p 2 days Remdesivir treatment - currently no respiratory symptoms. Continue to monitor off treatment - trend inflammatory markers   Seizure disorder (HCC) - Continue phenobarbital - Drug level normal on admission, 19.4  Essential hypertension - Controlled without medication  Hypokalemia Repleting as needed  OSA on CPAP - Continue nightly CPAP  GERD (gastroesophageal reflux disease) - Continue Protonix  Bilateral lower extremity edema - Stable and chronic.  History of lymphedema  Restless legs syndrome (RLS) - Continue pramipexole     Subjective:  No events  overnight.  Still remains agitated and confused today.  Continues to pull off telemetry monitoring and IVs.  When seen this morning she was lying in bed naked with stool incontinence noted in the bed and moaning out but was half oriented. Able to tell me her name, year, president but did not know where she was or why she was here.   Objective Vitals:   03/04/21 2159 03/04/21 2221 03/05/21 0625 03/05/21 1240  BP: 131/70  119/66 (!) 154/76  Pulse: 85 78 84 97  Resp: 18 16 17 20   Temp: 98.2 F (36.8 C)  98.4 F (36.9 C) 98.9 F (37.2 C)  TempSrc: Oral  Oral Oral  SpO2: 95%  98% 100%  Weight:      Height:       116.9 kg  Vital signs were reviewed and unremarkable.   Exam Physical Exam Constitutional:      Comments: Obese woman laying in bed in NAD but grossly confused and naked in bed; stool noted on bed pad   HENT:     Head: Normocephalic and atraumatic.     Mouth/Throat:     Mouth: Mucous membranes are moist.  Eyes:     Extraocular Movements: Extraocular movements intact.  Cardiovascular:     Rate and Rhythm: Normal rate and regular rhythm.     Heart sounds: Normal heart sounds.  Pulmonary:     Effort: Pulmonary effort is normal. No respiratory distress.     Breath sounds: Normal breath sounds.  Abdominal:     General: Bowel sounds are normal. There is no distension.     Palpations: Abdomen is soft.  Tenderness: There is no abdominal tenderness.  Musculoskeletal:        General: Normal range of motion.     Cervical back: Normal range of motion and neck supple.  Skin:    General: Skin is warm and dry.  Neurological:     Mental Status: She is disoriented.     Motor: No weakness.  Psychiatric:     Comments: Inappropriate behavior and thought content      Labs / Other Information My review of labs, imaging, notes and other tests shows no new significant findings.    Disposition Plan: Status is: Inpatient  Remains inpatient appropriate because: ongoing  encephalopathy    Time spent: Greater than 50% of the 35 minute visit was spent in counseling/coordination of care for the patient as laid out in the A&P.  Lewie Chamber, MD [facility] 03/05/2021, 3:41 PM

## 2021-03-06 DIAGNOSIS — G9341 Metabolic encephalopathy: Secondary | ICD-10-CM | POA: Diagnosis not present

## 2021-03-06 DIAGNOSIS — U071 COVID-19: Secondary | ICD-10-CM | POA: Diagnosis not present

## 2021-03-06 DIAGNOSIS — N3000 Acute cystitis without hematuria: Secondary | ICD-10-CM | POA: Diagnosis not present

## 2021-03-06 LAB — CBC WITH DIFFERENTIAL/PLATELET
Abs Immature Granulocytes: 0.02 10*3/uL (ref 0.00–0.07)
Basophils Absolute: 0 10*3/uL (ref 0.0–0.1)
Basophils Relative: 0 %
Eosinophils Absolute: 0.1 10*3/uL (ref 0.0–0.5)
Eosinophils Relative: 2 %
HCT: 36.2 % (ref 36.0–46.0)
Hemoglobin: 11.6 g/dL — ABNORMAL LOW (ref 12.0–15.0)
Immature Granulocytes: 0 %
Lymphocytes Relative: 35 %
Lymphs Abs: 1.9 10*3/uL (ref 0.7–4.0)
MCH: 30.5 pg (ref 26.0–34.0)
MCHC: 32 g/dL (ref 30.0–36.0)
MCV: 95.3 fL (ref 80.0–100.0)
Monocytes Absolute: 0.4 10*3/uL (ref 0.1–1.0)
Monocytes Relative: 7 %
Neutro Abs: 3 10*3/uL (ref 1.7–7.7)
Neutrophils Relative %: 56 %
Platelets: 252 10*3/uL (ref 150–400)
RBC: 3.8 MIL/uL — ABNORMAL LOW (ref 3.87–5.11)
RDW: 14.6 % (ref 11.5–15.5)
WBC: 5.5 10*3/uL (ref 4.0–10.5)
nRBC: 0 % (ref 0.0–0.2)

## 2021-03-06 LAB — CULTURE, BLOOD (ROUTINE X 2)
Culture: NO GROWTH
Culture: NO GROWTH
Special Requests: ADEQUATE

## 2021-03-06 LAB — COMPREHENSIVE METABOLIC PANEL
ALT: 15 U/L (ref 0–44)
AST: 19 U/L (ref 15–41)
Albumin: 3.3 g/dL — ABNORMAL LOW (ref 3.5–5.0)
Alkaline Phosphatase: 79 U/L (ref 38–126)
Anion gap: 5 (ref 5–15)
BUN: 8 mg/dL (ref 8–23)
CO2: 27 mmol/L (ref 22–32)
Calcium: 8.8 mg/dL — ABNORMAL LOW (ref 8.9–10.3)
Chloride: 109 mmol/L (ref 98–111)
Creatinine, Ser: 0.62 mg/dL (ref 0.44–1.00)
GFR, Estimated: >60 mL/min (ref >60)
Glucose, Bld: 106 mg/dL — ABNORMAL HIGH (ref 70–99)
Potassium: 3 mmol/L — ABNORMAL LOW (ref 3.5–5.1)
Sodium: 141 mmol/L (ref 135–145)
Total Bilirubin: 0.6 mg/dL (ref 0.3–1.2)
Total Protein: 6.7 g/dL (ref 6.5–8.1)

## 2021-03-06 LAB — FERRITIN: Ferritin: 232 ng/mL (ref 11–307)

## 2021-03-06 LAB — MAGNESIUM: Magnesium: 1.6 mg/dL — ABNORMAL LOW (ref 1.7–2.4)

## 2021-03-06 LAB — C-REACTIVE PROTEIN: CRP: 1.4 mg/dL — ABNORMAL HIGH (ref <1.0)

## 2021-03-06 LAB — D-DIMER, QUANTITATIVE: D-Dimer, Quant: 2.1 ug/mL-FEU — ABNORMAL HIGH (ref 0.00–0.50)

## 2021-03-06 LAB — PHOSPHORUS: Phosphorus: 2.4 mg/dL — ABNORMAL LOW (ref 2.5–4.6)

## 2021-03-06 LAB — LACTATE DEHYDROGENASE: LDH: 160 U/L (ref 98–192)

## 2021-03-06 LAB — AMMONIA: Ammonia: 22 umol/L (ref 9–35)

## 2021-03-06 MED ORDER — POTASSIUM CHLORIDE CRYS ER 20 MEQ PO TBCR
40.0000 meq | EXTENDED_RELEASE_TABLET | ORAL | Status: AC
Start: 2021-03-06 — End: 2021-03-06
  Administered 2021-03-06 (×2): 40 meq via ORAL
  Filled 2021-03-06 (×2): qty 2

## 2021-03-06 MED ORDER — MAGNESIUM OXIDE -MG SUPPLEMENT 400 (240 MG) MG PO TABS
800.0000 mg | ORAL_TABLET | Freq: Once | ORAL | Status: AC
  Administered 2021-03-06: 800 mg via ORAL
  Filled 2021-03-06: qty 2

## 2021-03-06 MED ORDER — K PHOS MONO-SOD PHOS DI & MONO 155-852-130 MG PO TABS
500.0000 mg | ORAL_TABLET | Freq: Two times a day (BID) | ORAL | Status: AC
  Administered 2021-03-06 (×2): 500 mg via ORAL
  Filled 2021-03-06 (×2): qty 2

## 2021-03-06 NOTE — Progress Notes (Signed)
Progress Note    [name]   [id number]:[id number]  DOB: Jan 08, 1951  DOA: 03/01/2021     3 Date of Service: 03/06/2021   Clinical Course [name] is a 70 yo female with PMH lower extremity lymphedema, OSA, obesity, seizure disorder who presented from [place] nursing [facility] due to AMS.  She was previously being treated for a UTI and COVID approximately 5 days prior to admission.  Due to worsening confusion, she was sent to the hospital for further evaluation. She was started on Rocephin for treatment of UTI and admitted for further work-up.    Assessment and Plan * Acute metabolic encephalopathy - differential includes infection (UTI vs covid) vs delirium vs low drug levels (therapeutic phenobarb level on admission) vs other -Urine culture growing E. coli, continue Keflex; previously on Rocephin; discontinue antibiotics after 7 days - re-orient as able and needed - ammonia has been uptrending (38 >> 43) with questionable asterixis on exam 10/25; LFTs okay; trial of lactulose  -HIV, TSH, and RPR unremarkable - Folate level low, 3.5.  Continue replacement -B12 level low normal, 311.  Start replacement -Slight improvement today  Acute cystitis - Urine culture growing E. coli, resistant to Cipro - Started on Rocephin on admission and has been transitioned to Keflex to complete course  COVID-19 virus infection - s/p 2 days Remdesivir treatment - currently no respiratory symptoms. Continue to monitor off treatment - trend inflammatory markers   Seizure disorder (HCC) - Continue phenobarbital - Drug level normal on admission, 19.4  Essential hypertension - Controlled without medication  Hypokalemia Repleting as needed  OSA on CPAP - Continue nightly CPAP  GERD (gastroesophageal reflux disease) - Continue Protonix  Bilateral lower extremity edema - Stable and chronic.  History of lymphedema  Restless legs syndrome (RLS) - Continue pramipexole     Subjective:  No  events overnight.  Laying in bed appearing more awake and alert this morning.  Hospital gown in place today and she was cooperative with exam.  Objective Vitals:   03/05/21 2003 03/06/21 0500 03/06/21 0602 03/06/21 1152  BP: 116/69  (!) 139/114 (!) 155/79  Pulse: (!) 106  70 83  Resp: 20  20 18   Temp: 98 F (36.7 C)  98.9 F (37.2 C) 97.8 F (36.6 C)  TempSrc: Oral  Oral Oral  SpO2: 98%  94% 98%  Weight:  113.6 kg    Height:       113.6 kg  Vital signs were reviewed and unremarkable.   Exam Physical Exam Constitutional:      General: She is not in acute distress.    Comments: More cooperative and alert this morning  HENT:     Head: Normocephalic and atraumatic.     Mouth/Throat:     Mouth: Mucous membranes are moist.  Eyes:     Extraocular Movements: Extraocular movements intact.  Cardiovascular:     Rate and Rhythm: Normal rate and regular rhythm.     Heart sounds: Normal heart sounds.  Pulmonary:     Effort: Pulmonary effort is normal. No respiratory distress.     Breath sounds: Normal breath sounds.  Abdominal:     General: Bowel sounds are normal. There is no distension.     Palpations: Abdomen is soft.     Tenderness: There is no abdominal tenderness.  Musculoskeletal:        General: Normal range of motion.     Cervical back: Normal range of motion and neck supple.  Right lower leg: Edema present.     Left lower leg: Edema present.  Skin:    General: Skin is warm and dry.     Comments: Diffuse xerosis with skin flaking and indurated skin noted in bilateral lower extremities  Neurological:     Comments: Oriented to name, year.  Did not know the president or where she was  Psychiatric:        Mood and Affect: Mood normal.     Labs / Other Information My review of labs, imaging, notes and other tests shows no new significant findings.    Disposition Plan: Status is: Inpatient  Remains inpatient appropriate because: Ongoing treatment of  above   Time spent: Greater than 50% of the 35 minute visit was spent in counseling/coordination of care for the patient as laid out in the A&P.  Lewie Chamber, MD [facility] 03/06/2021, 1:02 PM

## 2021-03-06 NOTE — Progress Notes (Addendum)
Even after placing mittens on patient in order to keep patient from taking off cardiac monitoring and putting her hands in stool, she was able to get the mittens off in a minute.  Notified Garner Nash, NP and order for one-time dose of 1mg  IM haldol ordered and given.   Due to keeping her mittens off and giving IM haldol, I tried to place CPAP on patient per RT. Since this patient has been constantly taking off her cardiac monitoring and her CPAP. No improvement has been noted since administering IM haldol.   The last time I checked on patient when Central Telemetry monitors notified me, I found the cardiac monitoring box with leads on the floor with the back of the box open and battery on the floor.   Patient's skin is red where all the cardiac monitoring stickers have been placed.   Notified , NP. Will continue to try to keep cardiac monitoring on patient.

## 2021-03-07 DIAGNOSIS — G9341 Metabolic encephalopathy: Secondary | ICD-10-CM | POA: Diagnosis not present

## 2021-03-07 LAB — CBC WITH DIFFERENTIAL/PLATELET
Abs Immature Granulocytes: 0.04 10*3/uL (ref 0.00–0.07)
Basophils Absolute: 0 10*3/uL (ref 0.0–0.1)
Basophils Relative: 0 %
Eosinophils Absolute: 0.2 10*3/uL (ref 0.0–0.5)
Eosinophils Relative: 2 %
HCT: 36.8 % (ref 36.0–46.0)
Hemoglobin: 12.1 g/dL (ref 12.0–15.0)
Immature Granulocytes: 0 %
Lymphocytes Relative: 27 %
Lymphs Abs: 2.4 10*3/uL (ref 0.7–4.0)
MCH: 30.8 pg (ref 26.0–34.0)
MCHC: 32.9 g/dL (ref 30.0–36.0)
MCV: 93.6 fL (ref 80.0–100.0)
Monocytes Absolute: 0.6 10*3/uL (ref 0.1–1.0)
Monocytes Relative: 7 %
Neutro Abs: 5.7 10*3/uL (ref 1.7–7.7)
Neutrophils Relative %: 64 %
Platelets: 264 10*3/uL (ref 150–400)
RBC: 3.93 MIL/uL (ref 3.87–5.11)
RDW: 14.6 % (ref 11.5–15.5)
WBC: 8.9 10*3/uL (ref 4.0–10.5)
nRBC: 0 % (ref 0.0–0.2)

## 2021-03-07 LAB — COMPREHENSIVE METABOLIC PANEL
ALT: 17 U/L (ref 0–44)
AST: 23 U/L (ref 15–41)
Albumin: 3.3 g/dL — ABNORMAL LOW (ref 3.5–5.0)
Alkaline Phosphatase: 85 U/L (ref 38–126)
Anion gap: 11 (ref 5–15)
BUN: 7 mg/dL — ABNORMAL LOW (ref 8–23)
CO2: 23 mmol/L (ref 22–32)
Calcium: 9.1 mg/dL (ref 8.9–10.3)
Chloride: 108 mmol/L (ref 98–111)
Creatinine, Ser: 0.58 mg/dL (ref 0.44–1.00)
GFR, Estimated: >60 mL/min (ref >60)
Glucose, Bld: 96 mg/dL (ref 70–99)
Potassium: 3.9 mmol/L (ref 3.5–5.1)
Sodium: 142 mmol/L (ref 135–145)
Total Bilirubin: 0.5 mg/dL (ref 0.3–1.2)
Total Protein: 7 g/dL (ref 6.5–8.1)

## 2021-03-07 LAB — LACTATE DEHYDROGENASE: LDH: 161 U/L (ref 98–192)

## 2021-03-07 LAB — D-DIMER, QUANTITATIVE: D-Dimer, Quant: 1.61 ug/mL-FEU — ABNORMAL HIGH (ref 0.00–0.50)

## 2021-03-07 LAB — MAGNESIUM: Magnesium: 1.5 mg/dL — ABNORMAL LOW (ref 1.7–2.4)

## 2021-03-07 LAB — C-REACTIVE PROTEIN: CRP: 0.7 mg/dL (ref <1.0)

## 2021-03-07 NOTE — TOC Progression Note (Signed)
Transition of Care [facility]) - Progression Note    Patient Details  Name: [name] MRN: [id number] Date of Birth: Oct 03, 1950  [facility]) CM/SW Contact  Golda Acre, RN Phone Number: 03/07/2021, 7:34 AM  Clinical Narrative:    Assessment and Plan * Acute metabolic encephalopathy - differential includes infection (UTI vs covid) vs delirium vs low drug levels (therapeutic phenobarb level on admission) vs other -Urine culture growing E. coli, continue Keflex; previously on Rocephin; discontinue antibiotics after 7 days - re-orient as able and needed - ammonia has been uptrending (38 >> 43) with questionable asterixis on exam 10/25; LFTs okay; trial of lactulose  -HIV, TSH, and RPR unremarkable - Folate level low, 3.5.  Continue replacement -B12 level low normal, 311.  Start replacement -Slight improvement today   Acute cystitis - Urine culture growing E. coli, resistant to Cipro - Started on Rocephin on admission and has been transitioned to Keflex to complete course   COVID-19 virus infection - s/p 2 days Remdesivir treatment - currently no respiratory symptoms. Continue to monitor off treatment - trend inflammatory markers    Seizure disorder (HCC) - Continue phenobarbital - Drug level normal on admission, 19.4   Essential hypertension - Controlled without medication   Hypokalemia Repleting as needed   OSA on CPAP - Continue nightly CPAP   GERD (gastroesophageal reflux disease) - Continue Protonix   Bilateral lower extremity edema - Stable and chronic.  History of lymphedema   Restless legs syndrome (RLS) - Continue pramipexole   Subjective:  No events overnight.  Laying in bed appearing more awake and alert this morning.  Hospital gown in place today and she was cooperative with exam. TOC PLAN OF CARE: Following to see any hhc needs present and for progression pt is more alert at this time.  Expected Discharge Plan: Home/Self  Care Barriers to Discharge: Continued Medical Work up  Expected Discharge Plan and Services Expected Discharge Plan: Home/Self Care   Discharge Planning Services: CM Consult   Living arrangements for the past 2 months: Single Family Home Expected Discharge Date:  (unknown)                                     Social Determinants of Health (SDOH) Interventions    Readmission Risk Interventions Readmission Risk Prevention Plan 03/04/2021  Transportation Screening Complete  Medication Review [employer]) Complete  PCP or Specialist appointment within 3-5 days of discharge Complete  HRI or Home Care Consult Complete  SW Recovery Care/Counseling Consult Complete  Palliative Care Screening Not Applicable  Skilled Nursing Facility Not Applicable  Some recent data might be hidden

## 2021-03-07 NOTE — Progress Notes (Signed)
Progress Note    [name]   [id number]  DOB: 1950/07/23  DOA: 03/01/2021     4 Date of Service: 03/07/2021   Clinical Course [name] is a 70 yo female with PMH lower extremity lymphedema, OSA, obesity, seizure disorder who presented from [facility] due to AMS.  She was previously being treated for a UTI and COVID approximately 5 days prior to admission.  Due to worsening confusion, she was sent to the hospital for further evaluation. She was started on Rocephin for treatment of UTI and admitted for further work-up.  She was de-escalated to Keflex to complete her course in the hospital.  Assessment and Plan * Acute metabolic encephalopathy - differential includes infection (UTI vs covid) vs delirium vs low drug levels (therapeutic phenobarb level on admission) vs other -Urine culture growing E. coli, continue Keflex; previously on Rocephin; discontinue antibiotics after 7 days - re-orient as able and needed - ammonia has been uptrending (38 >> 43) with questionable asterixis on exam 10/25; LFTs okay; trial of lactulose; ammonia normalized on 03/06/2021 but confusion continued -HIV, TSH, and RPR unremarkable - Folate level low, 3.5.  Continue replacement -B12 level low normal, 311.  Start replacement - appears to have transient improvement; she's oriented to name and year today but nothing else; plus was again naked in bed today as she pulled everything off and soiled the bed  COVID-19 virus infection - s/p 2 days Remdesivir treatment - currently no respiratory symptoms. Continue to monitor off treatment - trend inflammatory markers   Acute cystitis-resolved as of 03/07/2021 - Urine culture growing E. coli, resistant to Cipro - Started on Rocephin on admission and has been transitioned to Keflex to complete course  Seizure disorder (HCC) - Continue phenobarbital - Drug level normal on admission, 19.4  Essential hypertension - Controlled without  medication  Hypokalemia Repleting as needed  OSA on CPAP - Continue nightly CPAP  GERD (gastroesophageal reflux disease) - Continue Protonix  Bilateral lower extremity edema - Stable and chronic.  History of lymphedema  Restless legs syndrome (RLS) - Continue pramipexole    Subjective:  No events overnight.  Unfortunately she had pulled her gown off again today and was laying in bed naked once again.  Nurse aides were in the room to change the bed pad as patient had again soiled it due to stool incontinence. She is oriented to name and year only.  Her nature was almost joking when answering questions but she truly did not know the answers and it did not appear to be dementia like.  Regardless, she was comfortable appearing and she is more awake and alert each day.  Objective Vitals:   03/06/21 1152 03/06/21 1900 03/07/21 0639 03/07/21 1158  BP: (!) 155/79 (!) 153/64 (!) 161/72 (!) 155/68  Pulse: 83 86 84 84  Resp: 18 19 18 18   Temp: 97.8 F (36.6 C) 98.4 F (36.9 C) 97.8 F (36.6 C) 98 F (36.7 C)  TempSrc: Oral Oral Oral Oral  SpO2: 98% 100% 100% 97%  Weight:      Height:       113.6 kg  Vital signs were reviewed and unremarkable.   Exam Physical Exam Constitutional:      Comments: Alert and oriented to name and year only; able to follow all commands easily  HENT:     Head: Normocephalic and atraumatic.     Mouth/Throat:     Mouth: Mucous membranes are moist.  Eyes:  Extraocular Movements: Extraocular movements intact.  Cardiovascular:     Rate and Rhythm: Normal rate and regular rhythm.     Heart sounds: Normal heart sounds.  Pulmonary:     Effort: Pulmonary effort is normal.     Breath sounds: Normal breath sounds.  Abdominal:     General: Bowel sounds are normal. There is no distension.     Palpations: Abdomen is soft.     Tenderness: There is no abdominal tenderness.  Musculoskeletal:        General: No swelling. Normal range of motion.      Cervical back: Normal range of motion and neck supple.  Skin:    General: Skin is warm and dry.  Neurological:     Mental Status: She is disoriented.     Motor: No weakness.  Psychiatric:     Comments: Abnormal behavior      Labs / Other Information My review of labs, imaging, notes and other tests shows no new significant findings.    Disposition Plan: Status is: Inpatient  Remains inpatient appropriate because: treatment of above     Time spent: 15 minutes  Lewie Chamber, MD [facility] 03/07/2021, 2:53 PM

## 2021-03-07 NOTE — Progress Notes (Signed)
Pt unable to wear cpap tonight due to confusion and mitts.

## 2021-03-07 NOTE — Care Management Important Message (Signed)
Medicare IM printed for W/[facility] Social Work to give to the patient. 

## 2021-03-08 DIAGNOSIS — G9341 Metabolic encephalopathy: Secondary | ICD-10-CM | POA: Diagnosis not present

## 2021-03-08 DIAGNOSIS — U071 COVID-19: Secondary | ICD-10-CM | POA: Diagnosis not present

## 2021-03-08 LAB — COMPREHENSIVE METABOLIC PANEL
ALT: 16 U/L (ref 0–44)
AST: 21 U/L (ref 15–41)
Albumin: 3.2 g/dL — ABNORMAL LOW (ref 3.5–5.0)
Alkaline Phosphatase: 75 U/L (ref 38–126)
Anion gap: 6 (ref 5–15)
BUN: 9 mg/dL (ref 8–23)
CO2: 24 mmol/L (ref 22–32)
Calcium: 8.9 mg/dL (ref 8.9–10.3)
Chloride: 109 mmol/L (ref 98–111)
Creatinine, Ser: 0.6 mg/dL (ref 0.44–1.00)
GFR, Estimated: >60 mL/min (ref >60)
Glucose, Bld: 88 mg/dL (ref 70–99)
Potassium: 3.5 mmol/L (ref 3.5–5.1)
Sodium: 139 mmol/L (ref 135–145)
Total Bilirubin: 0.6 mg/dL (ref 0.3–1.2)
Total Protein: 6.6 g/dL (ref 6.5–8.1)

## 2021-03-08 LAB — CBC WITH DIFFERENTIAL/PLATELET
Abs Immature Granulocytes: 0.02 10*3/uL (ref 0.00–0.07)
Basophils Absolute: 0 10*3/uL (ref 0.0–0.1)
Basophils Relative: 1 %
Eosinophils Absolute: 0.2 10*3/uL (ref 0.0–0.5)
Eosinophils Relative: 3 %
HCT: 33.8 % — ABNORMAL LOW (ref 36.0–46.0)
Hemoglobin: 10.9 g/dL — ABNORMAL LOW (ref 12.0–15.0)
Immature Granulocytes: 0 %
Lymphocytes Relative: 38 %
Lymphs Abs: 2.5 10*3/uL (ref 0.7–4.0)
MCH: 30.7 pg (ref 26.0–34.0)
MCHC: 32.2 g/dL (ref 30.0–36.0)
MCV: 95.2 fL (ref 80.0–100.0)
Monocytes Absolute: 0.5 10*3/uL (ref 0.1–1.0)
Monocytes Relative: 8 %
Neutro Abs: 3.3 10*3/uL (ref 1.7–7.7)
Neutrophils Relative %: 50 %
Platelets: 250 10*3/uL (ref 150–400)
RBC: 3.55 MIL/uL — ABNORMAL LOW (ref 3.87–5.11)
RDW: 14.5 % (ref 11.5–15.5)
WBC: 6.5 10*3/uL (ref 4.0–10.5)
nRBC: 0 % (ref 0.0–0.2)

## 2021-03-08 LAB — C-REACTIVE PROTEIN: CRP: 0.8 mg/dL (ref <1.0)

## 2021-03-08 LAB — D-DIMER, QUANTITATIVE: D-Dimer, Quant: 1.5 ug/mL-FEU — ABNORMAL HIGH (ref 0.00–0.50)

## 2021-03-08 LAB — MAGNESIUM: Magnesium: 1.6 mg/dL — ABNORMAL LOW (ref 1.7–2.4)

## 2021-03-08 LAB — LACTATE DEHYDROGENASE: LDH: 144 U/L (ref 98–192)

## 2021-03-08 MED ORDER — MAGNESIUM OXIDE -MG SUPPLEMENT 400 (240 MG) MG PO TABS
800.0000 mg | ORAL_TABLET | Freq: Two times a day (BID) | ORAL | Status: AC
Start: 2021-03-08 — End: 2021-03-10
  Administered 2021-03-08 – 2021-03-10 (×6): 800 mg via ORAL
  Filled 2021-03-08 (×7): qty 2

## 2021-03-08 NOTE — Progress Notes (Signed)
Progress Note    SHARONANN MALBROUGH   [id number]  DOB: 08/19/1950  DOA: 03/01/2021     5 Date of Service: 03/08/2021   Clinical Course Ms. Priore is a 70 yo female with PMH lower extremity lymphedema, OSA, obesity, seizure disorder who presented from [facility] nursing home due to AMS.  She was previously being treated for a UTI and COVID approximately 5 days prior to admission.  Due to worsening confusion, she was sent to the hospital for further evaluation. She was started on Rocephin for treatment of UTI and admitted for further work-up.  She was de-escalated to Keflex to complete her course in the hospital.  Assessment and Plan * Acute metabolic encephalopathy - differential includes infection (UTI vs covid) vs delirium vs low drug levels (therapeutic phenobarb level on admission) vs other -Urine culture growing E. coli, continue Keflex; previously on Rocephin; discontinue antibiotics after 7 days - re-orient as able and needed - ammonia has been uptrending (38 >> 43) with questionable asterixis on exam 10/25; LFTs okay; trial of lactulose; ammonia normalized on 03/06/2021 but confusion continued -HIV, TSH, and RPR unremarkable - Folate level low, 3.5.  Continue replacement -B12 level low normal, 311.  Start replacement - remains lethargic/fatigued in bed this am; gown laying on top of her; oriented to name   COVID-19 virus infection - s/p 2 days Remdesivir treatment - currently no respiratory symptoms. Continue to monitor off treatment - trend inflammatory markers   Acute cystitis-resolved as of 03/07/2021 - Urine culture growing E. coli, resistant to Cipro - Started on Rocephin on admission and has been transitioned to Keflex to complete course  Seizure disorder (HCC) - Continue phenobarbital - Drug level normal on admission, 19.4  Essential hypertension - Controlled without medication  Hypokalemia Repleting as needed  OSA on CPAP - Continue nightly CPAP  GERD  (gastroesophageal reflux disease) - Continue Protonix  Bilateral lower extremity edema - Stable and chronic.  History of lymphedema  Restless legs syndrome (RLS) - Continue pramipexole   Subjective:  No events overnight.  Unfortunately, still remains confused this morning.  Improvement seems to be transient.  Hospital gown lying on top of her at this morning but had obviously pulled it off again.  Oriented to her name only with me this morning.  Still able to follow all commands though and carry on conversation which is overall an improvement from admission. She is still very fatigued and lethargic and will fall asleep if not being spoken to.  Objective Vitals:   03/07/21 1158 03/07/21 2051 03/08/21 0612 03/08/21 1228  BP: (!) 155/68 139/68 (!) 150/74 (!) 153/69  Pulse: 84 82 (!) 56 65  Resp: 18 19 18 18   Temp: 98 F (36.7 C) 97.6 F (36.4 C) 98.1 F (36.7 C) 97.8 F (36.6 C)  TempSrc: Oral Oral Oral Oral  SpO2: 97% 95% 96% 94%  Weight:      Height:       113.6 kg  Vital signs were reviewed and unremarkable.   Exam Physical Exam Constitutional:      Appearance: Normal appearance.  HENT:     Head: Normocephalic and atraumatic.     Mouth/Throat:     Mouth: Mucous membranes are moist.  Eyes:     Extraocular Movements: Extraocular movements intact.  Cardiovascular:     Rate and Rhythm: Normal rate and regular rhythm.  Pulmonary:     Effort: Pulmonary effort is normal.     Breath sounds: Normal breath sounds. No  wheezing.  Abdominal:     General: Bowel sounds are normal. There is no distension.     Palpations: Abdomen is soft.     Tenderness: There is no abdominal tenderness.  Musculoskeletal:        General: Normal range of motion.     Cervical back: Normal range of motion and neck supple.  Skin:    General: Skin is warm and dry.  Neurological:     Mental Status: She is alert. She is disoriented.     Motor: No weakness.  Psychiatric:        Mood and Affect:  Mood normal.        Behavior: Behavior normal.     Labs / Other Information My review of labs, imaging, notes and other tests shows no new significant findings.    Disposition Plan: Status is: Inpatient  Remains inpatient appropriate because: Ongoing treatment of above    Time spent: Greater than 50% of the 35 minute visit was spent in counseling/coordination of care for the patient as laid out in the A&P.  Lewie Chamber, MD [facility] 03/08/2021, 1:24 PM

## 2021-03-09 DIAGNOSIS — G9341 Metabolic encephalopathy: Secondary | ICD-10-CM | POA: Diagnosis not present

## 2021-03-09 LAB — CBC WITH DIFFERENTIAL/PLATELET
Abs Immature Granulocytes: 0.02 10*3/uL (ref 0.00–0.07)
Basophils Absolute: 0 10*3/uL (ref 0.0–0.1)
Basophils Relative: 0 %
Eosinophils Absolute: 0.2 10*3/uL (ref 0.0–0.5)
Eosinophils Relative: 2 %
HCT: 35.9 % — ABNORMAL LOW (ref 36.0–46.0)
Hemoglobin: 11.6 g/dL — ABNORMAL LOW (ref 12.0–15.0)
Immature Granulocytes: 0 %
Lymphocytes Relative: 40 %
Lymphs Abs: 2.7 10*3/uL (ref 0.7–4.0)
MCH: 30.8 pg (ref 26.0–34.0)
MCHC: 32.3 g/dL (ref 30.0–36.0)
MCV: 95.2 fL (ref 80.0–100.0)
Monocytes Absolute: 0.6 10*3/uL (ref 0.1–1.0)
Monocytes Relative: 9 %
Neutro Abs: 3.3 10*3/uL (ref 1.7–7.7)
Neutrophils Relative %: 49 %
Platelets: 214 10*3/uL (ref 150–400)
RBC: 3.77 MIL/uL — ABNORMAL LOW (ref 3.87–5.11)
RDW: 14.6 % (ref 11.5–15.5)
WBC: 6.7 10*3/uL (ref 4.0–10.5)
nRBC: 0 % (ref 0.0–0.2)

## 2021-03-09 LAB — COMPREHENSIVE METABOLIC PANEL
ALT: 18 U/L (ref 0–44)
AST: 25 U/L (ref 15–41)
Albumin: 3.3 g/dL — ABNORMAL LOW (ref 3.5–5.0)
Alkaline Phosphatase: 75 U/L (ref 38–126)
Anion gap: 6 (ref 5–15)
BUN: 12 mg/dL (ref 8–23)
CO2: 27 mmol/L (ref 22–32)
Calcium: 9.1 mg/dL (ref 8.9–10.3)
Chloride: 107 mmol/L (ref 98–111)
Creatinine, Ser: 0.58 mg/dL (ref 0.44–1.00)
GFR, Estimated: >60 mL/min (ref >60)
Glucose, Bld: 93 mg/dL (ref 70–99)
Potassium: 3.4 mmol/L — ABNORMAL LOW (ref 3.5–5.1)
Sodium: 140 mmol/L (ref 135–145)
Total Bilirubin: 0.6 mg/dL (ref 0.3–1.2)
Total Protein: 6.9 g/dL (ref 6.5–8.1)

## 2021-03-09 LAB — D-DIMER, QUANTITATIVE: D-Dimer, Quant: 1.44 ug/mL-FEU — ABNORMAL HIGH (ref 0.00–0.50)

## 2021-03-09 LAB — LACTATE DEHYDROGENASE: LDH: 140 U/L (ref 98–192)

## 2021-03-09 LAB — C-REACTIVE PROTEIN: CRP: 0.6 mg/dL (ref <1.0)

## 2021-03-09 LAB — MAGNESIUM: Magnesium: 1.8 mg/dL (ref 1.7–2.4)

## 2021-03-09 MED ORDER — POTASSIUM CHLORIDE CRYS ER 20 MEQ PO TBCR
40.0000 meq | EXTENDED_RELEASE_TABLET | Freq: Once | ORAL | Status: AC
  Administered 2021-03-09: 40 meq via ORAL
  Filled 2021-03-09: qty 2

## 2021-03-09 NOTE — Progress Notes (Signed)
Progress Note    [name]   [id number]  DOB: [birth year]  DOA: 03/01/2021     6 Date of Service: 03/09/2021   Clinical Course [name] is a 70 yo female with PMH lower extremity lymphedema, OSA, obesity, seizure disorder who presented from [facility] nursing home due to AMS.  She was previously being treated for a UTI and COVID approximately 5 days prior to admission.  Due to worsening confusion, she was sent to the hospital for further evaluation. She was started on Rocephin for treatment of UTI and admitted for further work-up.  She was de-escalated to Keflex to complete her course in the hospital.   Assessment and Plan * Acute metabolic encephalopathy - differential includes infection (UTI vs covid) vs delirium vs low drug levels (therapeutic phenobarb level on admission) vs other -Urine culture growing E. coli, continue Keflex; previously on Rocephin; discontinue antibiotics after 7 days - re-orient as able and needed - ammonia has been uptrending (38 >> 43) with questionable asterixis on exam 10/25; LFTs okay; trial of lactulose; ammonia normalized on 03/06/2021 but confusion continued -HIV, TSH, and RPR unremarkable - Folate level low, 3.5.  Continue replacement -B12 level low normal, 311.  Start replacement -CT head does show multifocal areas of encephalomalacia involving right frontal, right occipital, right posterior parietal, and bilateral anterior temporal lobes.  It is possible that underlying encephalomalacia is contributing to her generalized dementia like symptoms as we have ruled out most other major etiologies and treated reversible causes; furthermore, husband states that patient has been in this mentation for approximately 1 month with not much improvement -Tentative plan is for second opinion from neurology tomorrow  COVID-19 virus infection - s/p 2 days Remdesivir treatment - currently no respiratory symptoms. Continue to monitor off treatment - trend  inflammatory markers   Acute cystitis-resolved as of 03/07/2021 - Urine culture growing E. coli, resistant to Cipro - Started on Rocephin on admission and has been transitioned to Keflex to complete course  Seizure disorder (HCC) - Continue phenobarbital - Drug level normal on admission, 19.4  Essential hypertension - Controlled without medication  Hypokalemia Repleting as needed  OSA on CPAP - Continue nightly CPAP  GERD (gastroesophageal reflux disease) - Continue Protonix  Bilateral lower extremity edema - Stable and chronic.  History of lymphedema  Restless legs syndrome (RLS) - Continue pramipexole     Subjective:  No events overnight.  Still remains confused in bed but is more awake and following all commands this morning. Only able to tell me her name correctly.  Objective Vitals:   03/08/21 1228 03/08/21 2136 03/08/21 2246 03/09/21 1218  BP: (!) 153/69 137/63  (!) 134/55  Pulse: 65 82  86  Resp: 18 19 18 18   Temp: 97.8 F (36.6 C) 98.4 F (36.9 C)  97.7 F (36.5 C)  TempSrc: Oral Oral  Oral  SpO2: 94% 97%  96%  Weight:      Height:       113.6 kg  Vital signs were reviewed and unremarkable.   Exam Physical Exam Constitutional:      Appearance: Normal appearance.  HENT:     Head: Normocephalic and atraumatic.     Mouth/Throat:     Mouth: Mucous membranes are moist.  Eyes:     Extraocular Movements: Extraocular movements intact.  Cardiovascular:     Rate and Rhythm: Normal rate and regular rhythm.  Pulmonary:     Effort: Pulmonary effort is normal.     Breath  sounds: Normal breath sounds. No wheezing.  Abdominal:     General: Bowel sounds are normal. There is no distension.     Palpations: Abdomen is soft.     Tenderness: There is no abdominal tenderness.  Musculoskeletal:        General: Normal range of motion.     Cervical back: Normal range of motion and neck supple.  Skin:    General: Skin is warm and dry.  Neurological:      Mental Status: She is alert. She is disoriented.     Motor: No weakness.  Psychiatric:        Mood and Affect: Mood normal.        Behavior: Behavior normal.     Labs / Other Information My review of labs, imaging, notes and other tests shows no new significant findings.    Disposition Plan: Status is: Inpatient  Remains inpatient appropriate because: Ongoing treatment of above    Time spent: Greater than 50% of the 35 minute visit was spent in counseling/coordination of care for the patient as laid out in the A&P.  Lewie Chamber, MD [facility] 03/09/2021, 1:33 PM

## 2021-03-09 NOTE — Progress Notes (Signed)
Pt still slightly confused and refusing to keep her CPAP on.  RT will try again tomorrow.

## 2021-03-09 NOTE — TOC Progression Note (Signed)
Transition of Care [facility]) - Progression Note    Patient Details  Name: ENGLISH TOMER MRN: [id number] Date of Birth: 03/27/1951  [facility]) CM/SW Contact  Aric Jost, Olegario Messier, RN Phone Number: 03/09/2021, 3:28 PM  Clinical Narrative: confirmed from [facility]-rep Janie aware. Spouse William-d/c plan to return. PT cons ordered.      Expected Discharge Plan: Skilled Nursing Facility Barriers to Discharge: Continued Medical Work up  Expected Discharge Plan and Services Expected Discharge Plan: Skilled Nursing Facility   Discharge Planning Services: CM Consult   Living arrangements for the past 2 months: Skilled Nursing Facility Expected Discharge Date:  (unknown)                                     Social Determinants of Health (SDOH) Interventions    Readmission Risk Interventions Readmission Risk Prevention Plan 03/04/2021  Transportation Screening Complete  Medication Review [facility]) Complete  PCP or Specialist appointment within 3-5 days of discharge Complete  HRI or Home Care Consult Complete  SW Recovery Care/Counseling Consult Complete  Palliative Care Screening Not Applicable  Skilled Nursing Facility Not Applicable  Some recent data might be hidden

## 2021-03-10 DIAGNOSIS — R4189 Other symptoms and signs involving cognitive functions and awareness: Secondary | ICD-10-CM | POA: Diagnosis not present

## 2021-03-10 DIAGNOSIS — G9341 Metabolic encephalopathy: Secondary | ICD-10-CM | POA: Diagnosis not present

## 2021-03-10 LAB — COMPREHENSIVE METABOLIC PANEL
ALT: 20 U/L (ref 0–44)
AST: 30 U/L (ref 15–41)
Albumin: 3.3 g/dL — ABNORMAL LOW (ref 3.5–5.0)
Alkaline Phosphatase: 75 U/L (ref 38–126)
Anion gap: 6 (ref 5–15)
BUN: 13 mg/dL (ref 8–23)
CO2: 26 mmol/L (ref 22–32)
Calcium: 9.2 mg/dL (ref 8.9–10.3)
Chloride: 106 mmol/L (ref 98–111)
Creatinine, Ser: 0.67 mg/dL (ref 0.44–1.00)
GFR, Estimated: >60 mL/min (ref >60)
Glucose, Bld: 95 mg/dL (ref 70–99)
Potassium: 3.9 mmol/L (ref 3.5–5.1)
Sodium: 138 mmol/L (ref 135–145)
Total Bilirubin: 0.7 mg/dL (ref 0.3–1.2)
Total Protein: 6.6 g/dL (ref 6.5–8.1)

## 2021-03-10 LAB — CBC WITH DIFFERENTIAL/PLATELET
Abs Immature Granulocytes: 0.02 10*3/uL (ref 0.00–0.07)
Basophils Absolute: 0 10*3/uL (ref 0.0–0.1)
Basophils Relative: 1 %
Eosinophils Absolute: 0.1 10*3/uL (ref 0.0–0.5)
Eosinophils Relative: 2 %
HCT: 35.4 % — ABNORMAL LOW (ref 36.0–46.0)
Hemoglobin: 11.5 g/dL — ABNORMAL LOW (ref 12.0–15.0)
Immature Granulocytes: 0 %
Lymphocytes Relative: 39 %
Lymphs Abs: 2.6 10*3/uL (ref 0.7–4.0)
MCH: 31 pg (ref 26.0–34.0)
MCHC: 32.5 g/dL (ref 30.0–36.0)
MCV: 95.4 fL (ref 80.0–100.0)
Monocytes Absolute: 0.5 10*3/uL (ref 0.1–1.0)
Monocytes Relative: 8 %
Neutro Abs: 3.4 10*3/uL (ref 1.7–7.7)
Neutrophils Relative %: 50 %
Platelets: 193 10*3/uL (ref 150–400)
RBC: 3.71 MIL/uL — ABNORMAL LOW (ref 3.87–5.11)
RDW: 14.5 % (ref 11.5–15.5)
WBC: 6.6 10*3/uL (ref 4.0–10.5)
nRBC: 0 % (ref 0.0–0.2)

## 2021-03-10 LAB — MAGNESIUM: Magnesium: 1.8 mg/dL (ref 1.7–2.4)

## 2021-03-10 LAB — D-DIMER, QUANTITATIVE: D-Dimer, Quant: 1.54 ug/mL-FEU — ABNORMAL HIGH (ref 0.00–0.50)

## 2021-03-10 LAB — LACTATE DEHYDROGENASE: LDH: 142 U/L (ref 98–192)

## 2021-03-10 LAB — C-REACTIVE PROTEIN: CRP: 0.6 mg/dL (ref <1.0)

## 2021-03-10 NOTE — Progress Notes (Signed)
Progress Note    [name]   [id number]  DOB: 1951/05/02  DOA: 03/01/2021     7 Date of Service: 03/10/2021   Clinical Course [name] is a 70 yo female with PMH lower extremity lymphedema, OSA, obesity, seizure disorder who presented from [place] nursing home due to AMS.  She was previously being treated for a UTI and COVID approximately 5 days prior to admission.  Due to worsening confusion, she was sent to the hospital for further evaluation. She was started on Rocephin for treatment of UTI and admitted for further work-up.  She was de-escalated to Keflex to complete her course in the hospital. See below for further A&P.   Assessment and Plan * Acute metabolic encephalopathy - differential includes infection (UTI vs covid) vs delirium vs low drug levels (therapeutic phenobarb level on admission) vs neurocognitive impairment -Urine culture growing E. coli, continue Keflex; previously on Rocephin; discontinue antibiotics after 7 days - re-orient as able and needed - ammonia has been uptrending (38 >> 43) with questionable asterixis on exam 10/25; LFTs okay; trial of lactulose; ammonia normalized on 03/06/2021 but confusion continued -HIV, TSH, and RPR unremarkable - Folate level low, 3.5.  Continue replacement -B12 level low normal, 311.  Start replacement -CT head does show multifocal areas of encephalomalacia involving right frontal, right occipital, right posterior parietal, and bilateral anterior temporal lobes.  It is possible that underlying encephalomalacia is contributing to her generalized dementia like symptoms as we have ruled out most other major etiologies and treated reversible causes; furthermore, husband states that patient has been in this mentation for approximately 1 month with not much improvement -Discussed with neurology on 03/10/2021.  Outpatient appointment to be arranged for follow-up for further neurocognitive testing  Cognitive impairment - See  encephalopathy.  Suspicion at this time is underlying chronic cognitive impairment due to encephalomalacia seen on CT head. -Patient will follow up outpatient with neurology for further neurocognitive evaluation  COVID-19 virus infection - s/p 2 days Remdesivir treatment - currently no respiratory symptoms. Continue to monitor off treatment - trend inflammatory markers   Acute cystitis-resolved as of 03/07/2021 - Urine culture growing E. coli, resistant to Cipro - Started on Rocephin on admission and has been transitioned to Keflex to complete course  Seizure disorder (HCC) - Continue phenobarbital - Drug level normal on admission, 19.4  Essential hypertension - Controlled without medication  Hypokalemia Repleting as needed  OSA on CPAP - Continue nightly CPAP  GERD (gastroesophageal reflux disease) - Continue Protonix  Bilateral lower extremity edema - Stable and chronic.  History of lymphedema  Restless legs syndrome (RLS) - Continue pramipexole     Subjective:  No events overnight.  Resting in bed comfortably this morning.  Still remains oriented to name only but is otherwise stable.  Objective Vitals:   03/08/21 2136 03/08/21 2246 03/09/21 1218 03/10/21 1200  BP: 137/63  (!) 134/55 (!) 142/61  Pulse: 82  86 68  Resp: 19 18 18 18   Temp: 98.4 F (36.9 C)  97.7 F (36.5 C) 98.1 F (36.7 C)  TempSrc: Oral  Oral Oral  SpO2: 97%  96% 99%  Weight:      Height:       113.6 kg  Vital signs were reviewed and unremarkable.   Exam Physical Exam Constitutional:      Appearance: Normal appearance.  HENT:     Head: Normocephalic and atraumatic.     Mouth/Throat:     Mouth: Mucous membranes are  moist.  Eyes:     Extraocular Movements: Extraocular movements intact.  Cardiovascular:     Rate and Rhythm: Normal rate and regular rhythm.  Pulmonary:     Effort: Pulmonary effort is normal.     Breath sounds: Normal breath sounds. No wheezing.  Abdominal:      General: Bowel sounds are normal. There is no distension.     Palpations: Abdomen is soft.     Tenderness: There is no abdominal tenderness.  Musculoskeletal:        General: Normal range of motion.     Cervical back: Normal range of motion and neck supple.  Skin:    General: Skin is warm and dry.  Neurological:     Mental Status: She is alert. She is disoriented.     Motor: No weakness.     Comments: Oriented to name only  Psychiatric:        Mood and Affect: Mood normal.        Behavior: Behavior normal.     Labs / Other Information My review of labs, imaging, notes and other tests shows no new significant findings.    Disposition Plan: Status is: Inpatient  Remains inpatient appropriate because: Ongoing treatment of above; stable for discharge back to [facility] when they are able to accept back.  Outpatient follow-up with neurology for neurocognitive testing    Time spent: Greater than 50% of the 35 minute visit was spent in counseling/coordination of care for the patient as laid out in the A&P.  Shelley Chamber, MD [facility] 03/10/2021, 12:06 PM

## 2021-03-10 NOTE — [place] FL2 (Signed)
[place] MEDICAID FL2 LEVEL OF CARE SCREENING TOOL     IDENTIFICATION  Patient Name: [name] Birthdate: 1950-12-16 Sex: female Admission Date (Current Location): 03/01/2021  [facility] and [place] Number:  Producer, television/film/video and Address:  [facility],  [address], [place] [address]      Provider Number:    Attending Physician Name and Address:  Lewie Chamber, MD  Relative Name and Phone Number:  Caryle Helgeson [phone]    Current Level of Care: Hospital Recommended Level of Care: Skilled Nursing Facility Prior Approval Number:    Date Approved/Denied:   PASRR Number: [id number]  Discharge Plan:      Current Diagnoses: Patient Active Problem List   Diagnosis Date Noted   Cognitive impairment 03/10/2021   Acute metabolic encephalopathy 03/03/2021   COVID-19 virus infection 03/01/2021   Cellulitis and abscess of foot 02/07/2021   Acute renal failure (ARF) (HCC) 01/19/2021   ARF (acute renal failure) (HCC) 01/18/2021   SIRS (systemic inflammatory response syndrome) (HCC) 01/09/2021   Acute encephalopathy 01/09/2021   Hyponatremia 12/20/2018   Cellulitis 12/20/2018   Cellulitis of left lower extremity 12/19/2018   Hypokalemia 02/17/2017   DOE (dyspnea on exertion) 01/08/2017   Medication management 01/08/2017   Weight gain 01/08/2017   Lymphedema of right lower extremity 10/22/2016   Wound dehiscence, surgical, subsequent encounter 05/12/2016   S/P ankle arthrodesis 03/12/2016   OSA on CPAP 09/10/2015   Nocturia more than twice per night 09/10/2015   Morbid obesity due to excess calories (HCC) 09/10/2015   Loss of consciousness (HCC) 08/15/2015   Fracture of ankle, trimalleolar, closed 07/03/2015   Syncope 07/02/2015   Seizure disorder (HCC) 07/02/2015   Essential hypertension 07/02/2015   Bilateral lower extremity edema 07/02/2015   Closed right ankle fracture 07/02/2015   GERD (gastroesophageal reflux disease) 07/02/2015    HLD (hyperlipidemia) 07/02/2015   Anosmia/chronic 07/02/2015   Leukocytosis 07/02/2015   Restless legs syndrome (RLS) 04/28/2013   Sleep apnea with use of continuous positive airway pressure (CPAP) 01/24/2013    Orientation RESPIRATION BLADDER Height & Weight     Self  Normal   Weight: 113.6 kg Height:  5\' 2"  (157.5 cm)  BEHAVIORAL SYMPTOMS/MOOD NEUROLOGICAL BOWEL NUTRITION STATUS      Incontinent Diet (Heart Healthy)  AMBULATORY STATUS COMMUNICATION OF NEEDS Skin   Limited Assist Verbally PU Stage and Appropriate Care (RLE-soap,h20,dry,eucerin cream;see d/c summary.)                       Personal Care Assistance Level of Assistance  Bathing, Feeding, Dressing Bathing Assistance: Limited assistance   Dressing Assistance: Limited assistance     Functional Limitations Info  Sight, Hearing, Speech Sight Info: Impaired ) Hearing Info: Adequate Speech Info: Adequate    SPECIAL CARE FACTORS FREQUENCY  PT (By licensed PT), OT (By licensed OT)     PT Frequency:  (5x week) OT Frequency:  (5x week)            Contractures Contractures Info: Not present    Additional Factors Info  Code Status, Allergies Code Status Info:  (Full) Allergies Info:  (Compazine, Prochlorperazine Maleate, Topamax (Topiramate), Codeine Sulfate, Other, Seasonal Ic (Cholestatin), Adhesive (Tape), Iodinated Diagnostic Agents, Pedi-pre Tape Spray (Wound Dressing Adhesive))           Current Medications (03/10/2021):  This is the current hospital active medication list Current Facility-Administered Medications  Medication Dose Route Frequency Provider Last Rate Last Admin  acetaminophen (TYLENOL) tablet 650 mg  650 mg Oral Q6H PRN Marcelyn Bruins, MD   650 mg at 03/06/21 1213   Or   acetaminophen (TYLENOL) suppository 650 mg  650 mg Rectal Q6H PRN Marcelyn Bruins, MD       albuterol (VENTOLIN HFA) 108 (90 Base) MCG/ACT inhaler 2 puff  2 puff Inhalation Q4H PRN Marcelyn Bruins, MD       ascorbic acid (VITAMIN C) tablet 500 mg  500 mg Oral Daily Marcelyn Bruins, MD   500 mg at 03/10/21 [id number]   enoxaparin (LOVENOX) injection 60 mg  60 mg Subcutaneous Q24H Marcelyn Bruins, MD   60 mg at [id number]   folic acid (FOLVITE) tablet 1 mg  1 mg Oral Daily Eudelia Bunch, RPH   1 mg at 03/10/21 0930   guaiFENesin-dextromethorphan (ROBITUSSIN DM) 100-10 MG/5ML syrup 10 mL  10 mL Oral Q4H PRN Marcelyn Bruins, MD       hydrocerin (EUCERIN) cream   Topical Daily Annita Brod, MD   Given at 03/10/21 0932   liver oil-zinc oxide (DESITIN) 40 % ointment   Topical PRN Dwyane Dee, MD       LORazepam (ATIVAN) tablet 0.5 mg  0.5 mg Oral Q4H PRN Dwyane Dee, MD   0.5 mg at 03/07/21 2053   magnesium oxide (MAG-OX) tablet 800 mg  800 mg Oral BID Dwyane Dee, MD   800 mg at 03/10/21 [id number]   montelukast (SINGULAIR) tablet 10 mg  10 mg Oral QHS Marcelyn Bruins, MD   10 mg at 03/09/21 2216   pantoprazole (PROTONIX) EC tablet 40 mg  40 mg Oral Daily Marcelyn Bruins, MD   40 mg at 03/10/21 [id number]   PHENobarbital (LUMINAL) tablet 129.6 mg  129.6 mg Oral QHS Marcelyn Bruins, MD   129.6 mg at 03/09/21 2216   polyethylene glycol (MIRALAX / GLYCOLAX) packet 17 g  17 g Oral Daily PRN Marcelyn Bruins, MD       Pramipexole Dihydrochloride TB24 0.75 mg  1 tablet Oral QHS Marcelyn Bruins, MD       rosuvastatin (CRESTOR) tablet 20 mg  20 mg Oral Daily Marcelyn Bruins, MD   20 mg at 03/10/21 [id number]   sodium chloride flush (NS) 0.9 % injection 3 mL  3 mL Intravenous Q12H Marcelyn Bruins, MD   3 mL at 03/03/21 2155   vitamin B-12 (CYANOCOBALAMIN) tablet 1,000 mcg  1,000 mcg Oral Daily Dwyane Dee, MD   1,000 mcg at 03/10/21 [id number]   zinc sulfate capsule 220 mg  220 mg Oral Daily Marcelyn Bruins, MD   220 mg at 03/10/21 [id number]     Discharge Medications: Please see discharge summary for a list of discharge medications.  Relevant Imaging  Results:  Relevant Lab Results:   Additional Information SS#: [id number]. Pf[employer] 07/19/19, 08/09/19  Dessa Phi, RN

## 2021-03-10 NOTE — Progress Notes (Signed)
Pt still will not keep CPAP mask on.

## 2021-03-10 NOTE — Evaluation (Signed)
Physical Therapy Evaluation Patient Details Name: [name] MRN: [id number] DOB: May 07, 1951 Today's Date: 03/10/2021  History of Present Illness  [name] is a 70 yo female with PMH lower extremity lymphedema, OSA, obesity, seizure disorder, HTN R ankle fusion  who presented from [facility] 03/01/21  due to AMS.  She was previously being treated for a UTI and COVID approximately 5 days prior to admission.  Due to worsening confusion, she was sent to the hospital for further evaluation.  She was started on Rocephin for treatment of UTI and admitted for further work-up.  Clinical Impression   The patient is awake, very restless, constantly scratching skin on buttocks.  Much of speech is jargon, is able to say some clear sentences.  Patient required min assistance  for bed mobility, transferred  to [facility] and then took a few steps to sink to wash hands, using RW and min assist..  Patient comes from SNF rehab, has been in hospital  again recently. Patient will benefit from return to  facility.   Pt admitted with above diagnosis.   Pt currently with functional limitations due to the deficits listed below (see PT Problem List). Pt will benefit from skilled PT to increase their independence and safety with mobility to allow discharge to the venue listed below.        Recommendations for follow up therapy are one component of a multi-disciplinary discharge planning process, led by the attending physician.  Recommendations may be updated based on patient status, additional functional criteria and insurance authorization.  Follow Up Recommendations Skilled nursing-short term rehab (<3 hours/day)    Assistance Recommended at Discharge None  Functional Status Assessment Patient has had a recent decline in their functional status and demonstrates the ability to make significant improvements in function in a reasonable and predictable amount of time.  Equipment Recommendations  None  recommended by PT    Recommendations for Other Services       Precautions / Restrictions Precautions Precautions: Fall Precaution Comments: had wound oozing  3 weeks ago at [facility]. No WB restrictions indicated Restrictions Weight Bearing Restrictions: No      Mobility  Bed Mobility Overal bed mobility: Needs Assistance Bed Mobility: Supine to Sit;Sit to Supine     Supine to sit: Min guard Sit to supine: Min assist   General bed mobility comments: multimodal cues  to stay on task, able to swing legss over bed edge and sit up , min assist for legs back onto bed and constant cues to stay on task.    Transfers Overall transfer level: Needs assistance   Transfers: Sit to/from Stand;Stand Pivot Transfers Sit to Stand: Min assist Stand pivot transfers: Min assist         General transfer comment: multimodal , constant cues to stnad and pivot toBSC.    Ambulation/Gait Ambulation/Gait assistance: Min assist Gait Distance (Feet): 4 Feet Assistive device: Rolling walker (2 wheels) Gait Pattern/deviations: Step-to pattern;Decreased step length - left;Antalgic     General Gait Details: stepped to sink to wash hands, proppoed on sink, stepped back to bed.  Stairs            Wheelchair Mobility    Modified Rankin (Stroke Patients Only)       Balance Overall balance assessment: Needs assistance Sitting-balance support: No upper extremity supported;Feet supported Sitting balance-Leahy Scale: Fair Sitting balance - Comments: prolonged time sitting EOB without UE   Standing balance support: Bilateral upper extremity supported Standing balance-Leahy Scale: Poor  Standing balance comment: reliant on BUE support                             Pertinent Vitals/Pain Pain Assessment: PAINAD Faces Pain Scale: Hurts little more Body Language: tense, distressed pacing, fidgeting Consolability: distracted or reassured by voice/touch Pain Location: skin  itching, Pain Descriptors / Indicators: Discomfort;Burning Pain Intervention(s): Monitored during session    Home Living Family/patient expects to be discharged to:: Skilled nursing facility                        Prior Function               Mobility Comments: from SNF, unsure of function, pt. cannot report ADLs Comments: previous encounter 1 month ago reports ambulated 50-100' at facility     Hand Dominance        Extremity/Trunk Assessment   Upper Extremity Assessment Upper Extremity Assessment: Generalized weakness    Lower Extremity Assessment Lower Extremity Assessment: Generalized weakness    Cervical / Trunk Assessment Cervical / Trunk Exceptions: large body habitus  Communication   Communication: Expressive difficulties (much is jibberish and non sensible)  Cognition Arousal/Alertness: Awake/alert Behavior During Therapy: Restless Overall Cognitive Status: No family/caregiver present to determine baseline cognitive functioning Area of Impairment: Orientation;Attention;Following commands;Safety/judgement;Awareness                 Orientation Level: Disoriented to;Place;Time Current Attention Level: Focused   Following Commands: Follows one step commands inconsistently     Problem Solving: Requires verbal cues;Requires tactile cues General Comments: patient very restless, constantly scratching, asking therapish to scratch her back        General Comments      Exercises     Assessment/Plan    PT Assessment Patient needs continued PT services  PT Problem List Decreased strength;Decreased mobility;Decreased safety awareness;Decreased range of motion;Decreased activity tolerance;Cardiopulmonary status limiting activity;Decreased balance;Decreased knowledge of use of DME;Pain;Decreased cognition;Decreased knowledge of precautions       PT Treatment Interventions DME instruction;Therapeutic activities;Gait training;Therapeutic  exercise;Balance training;Functional mobility training;Cognitive remediation;Patient/family education    PT Goals (Current goals can be found in the Care Plan section)  Acute Rehab PT Goals PT Goal Formulation: Patient unable to participate in goal setting Time For Goal Achievement: 03/24/21 Potential to Achieve Goals: Fair    Frequency Min 2X/week   Barriers to discharge        Co-evaluation               AM-PAC PT "6 Clicks" Mobility  Outcome Measure Help needed turning from your back to your side while in a flat bed without using bedrails?: A Little Help needed moving from lying on your back to sitting on the side of a flat bed without using bedrails?: A Little Help needed moving to and from a bed to a chair (including a wheelchair)?: A Little Help needed standing up from a chair using your arms (e.g., wheelchair or bedside chair)?: A Little Help needed to walk in hospital room?: A Lot Help needed climbing 3-5 steps with a railing? : Total 6 Click Score: 15    End of Session   Activity Tolerance: Patient tolerated treatment well Patient left: in bed;with call bell/phone within reach Nurse Communication: Mobility status PT Visit Diagnosis: Muscle weakness (generalized) (M62.81);Other abnormalities of gait and mobility (R26.89);Other symptoms and signs involving the nervous system (Q68.341)    Time: 9622-2979 PT  Time Calculation (min) (ACUTE ONLY): 28 min   Charges:   PT Evaluation $PT Eval Low Complexity: 1 Low PT Treatments $Therapeutic Activity: 8-22 mins        Blanchard Kelch PT Acute Rehabilitation Services Pager [phone] Office [phone]   Rada Hay 03/10/2021, 11:27 AM

## 2021-03-10 NOTE — Assessment & Plan Note (Addendum)
-   See encephalopathy.  Suspicion at this time is underlying chronic cognitive impairment due to encephalomalacia seen on CT head. -Patient will follow up outpatient with neurology for further neurocognitive evaluation

## 2021-03-10 NOTE — TOC Progression Note (Signed)
Transition of Care [facility]) - Progression Note    Patient Details  Name: [name] MRN: [id number] Date of Birth: 05-08-1951  Transition of Care [facility] [place]) CM/SW Contact  Xaria Judon, Olegario Messier, RN Phone Number: 03/10/2021, 1:57 PM  Clinical Narrative: [employer] Him insurance auth initiated for [name].      Expected Discharge Plan: Skilled Nursing Facility Barriers to Discharge: Insurance Authorization  Expected Discharge Plan and Services Expected Discharge Plan: Skilled Nursing Facility   Discharge Planning Services: CM Consult   Living arrangements for the past 2 months: Skilled Nursing Facility Expected Discharge Date:  (unknown)                                     Social Determinants of Health (SDOH) Interventions    Readmission Risk Interventions Readmission Risk Prevention Plan 03/09/2021 03/04/2021  Transportation Screening Complete Complete  PCP or Specialist Appt within 3-5 Days Complete -  HRI or Home Care Consult Complete -  Social Work Consult for Recovery Care Planning/Counseling Complete -  Palliative Care Screening Complete -  Medication Review [employer]) Complete Complete  PCP or Specialist appointment within 3-5 days of discharge - Complete  HRI or Home Care Consult - Complete  SW Recovery Care/Counseling Consult - Complete  Palliative Care Screening - Not Applicable  Skilled Nursing Facility - Not Applicable  Some recent data might be hidden

## 2021-03-11 DIAGNOSIS — E785 Hyperlipidemia, unspecified: Secondary | ICD-10-CM | POA: Diagnosis not present

## 2021-03-11 DIAGNOSIS — K529 Noninfective gastroenteritis and colitis, unspecified: Secondary | ICD-10-CM | POA: Diagnosis not present

## 2021-03-11 DIAGNOSIS — G4733 Obstructive sleep apnea (adult) (pediatric): Secondary | ICD-10-CM | POA: Diagnosis not present

## 2021-03-11 DIAGNOSIS — I5032 Chronic diastolic (congestive) heart failure: Secondary | ICD-10-CM | POA: Diagnosis not present

## 2021-03-11 DIAGNOSIS — U071 COVID-19: Secondary | ICD-10-CM | POA: Diagnosis not present

## 2021-03-11 DIAGNOSIS — S8011XD Contusion of right lower leg, subsequent encounter: Secondary | ICD-10-CM | POA: Diagnosis not present

## 2021-03-11 DIAGNOSIS — G9341 Metabolic encephalopathy: Secondary | ICD-10-CM | POA: Diagnosis not present

## 2021-03-11 DIAGNOSIS — F419 Anxiety disorder, unspecified: Secondary | ICD-10-CM | POA: Diagnosis not present

## 2021-03-11 DIAGNOSIS — R569 Unspecified convulsions: Secondary | ICD-10-CM | POA: Diagnosis not present

## 2021-03-11 DIAGNOSIS — D649 Anemia, unspecified: Secondary | ICD-10-CM | POA: Diagnosis not present

## 2021-03-11 DIAGNOSIS — I890 Lymphedema, not elsewhere classified: Secondary | ICD-10-CM | POA: Diagnosis not present

## 2021-03-11 DIAGNOSIS — G40909 Epilepsy, unspecified, not intractable, without status epilepticus: Secondary | ICD-10-CM | POA: Diagnosis not present

## 2021-03-11 DIAGNOSIS — L0390 Cellulitis, unspecified: Secondary | ICD-10-CM | POA: Diagnosis not present

## 2021-03-11 DIAGNOSIS — L97919 Non-pressure chronic ulcer of unspecified part of right lower leg with unspecified severity: Secondary | ICD-10-CM | POA: Diagnosis not present

## 2021-03-11 DIAGNOSIS — L02619 Cutaneous abscess of unspecified foot: Secondary | ICD-10-CM | POA: Diagnosis not present

## 2021-03-11 DIAGNOSIS — I10 Essential (primary) hypertension: Secondary | ICD-10-CM | POA: Diagnosis not present

## 2021-03-11 DIAGNOSIS — L03115 Cellulitis of right lower limb: Secondary | ICD-10-CM | POA: Diagnosis not present

## 2021-03-11 DIAGNOSIS — E876 Hypokalemia: Secondary | ICD-10-CM | POA: Diagnosis not present

## 2021-03-11 DIAGNOSIS — J45909 Unspecified asthma, uncomplicated: Secondary | ICD-10-CM | POA: Diagnosis not present

## 2021-03-11 DIAGNOSIS — K219 Gastro-esophageal reflux disease without esophagitis: Secondary | ICD-10-CM | POA: Diagnosis not present

## 2021-03-11 DIAGNOSIS — I609 Nontraumatic subarachnoid hemorrhage, unspecified: Secondary | ICD-10-CM | POA: Diagnosis not present

## 2021-03-11 DIAGNOSIS — Z7401 Bed confinement status: Secondary | ICD-10-CM | POA: Diagnosis not present

## 2021-03-11 DIAGNOSIS — G894 Chronic pain syndrome: Secondary | ICD-10-CM | POA: Diagnosis not present

## 2021-03-11 DIAGNOSIS — M6281 Muscle weakness (generalized): Secondary | ICD-10-CM | POA: Diagnosis not present

## 2021-03-11 DIAGNOSIS — L299 Pruritus, unspecified: Secondary | ICD-10-CM | POA: Diagnosis not present

## 2021-03-11 DIAGNOSIS — R4189 Other symptoms and signs involving cognitive functions and awareness: Secondary | ICD-10-CM | POA: Diagnosis not present

## 2021-03-11 DIAGNOSIS — G9340 Encephalopathy, unspecified: Secondary | ICD-10-CM | POA: Diagnosis not present

## 2021-03-11 DIAGNOSIS — I959 Hypotension, unspecified: Secondary | ICD-10-CM | POA: Diagnosis not present

## 2021-03-11 MED ORDER — FERROUS SULFATE 325 (65 FE) MG PO TABS: 325.0000 mg | ORAL_TABLET | Freq: Every day | ORAL | 3 refills | Status: AC

## 2021-03-11 NOTE — Discharge Summary (Signed)
Physician Discharge Summary   Patient name: [name]  Admit date:     03/01/2021  Discharge date: 03/11/2021  Discharge Physician: Lewie Chamber   PCP: [name], MD   Recommendations at discharge:  Outpatient follow-up with neurology.  They will contact with appointment  Discharge Diagnoses Principal Problem:   Acute metabolic encephalopathy Active Problems:   Cognitive impairment   Seizure disorder (HCC)   Essential hypertension   Restless legs syndrome (RLS)   Bilateral lower extremity edema   GERD (gastroesophageal reflux disease)   OSA on CPAP   Lymphedema of right lower extremity   Hypokalemia   Resolved Diagnoses Resolved Problems:   COVID-19 virus infection   Acute cystitis   Hospital Course   Ms. Supplee is a 70 yo female with PMH lower extremity lymphedema, OSA, obesity, seizure disorder who presented from [facility] due to AMS.  She was previously being treated for a UTI and COVID approximately 5 days prior to admission.  Due to worsening confusion, she was sent to the hospital for further evaluation. She was started on Rocephin for treatment of UTI and admitted for further work-up.  She was de-escalated to Keflex to complete her course in the hospital. See below for further A&P.   * Acute metabolic encephalopathy - differential includes infection (UTI vs covid) vs delirium vs low drug levels (therapeutic phenobarb level on admission) vs neurocognitive impairment -Urine culture growing E. coli, continue Keflex; previously on Rocephin; discontinue antibiotics after 7 days - re-orient as able and needed - ammonia has been uptrending (38 >> 43) with questionable asterixis on exam 10/25; LFTs okay; trial of lactulose; ammonia normalized on 03/06/2021 but confusion continued -HIV, TSH, and RPR unremarkable - Folate level low, 3.5.  Continue replacement -B12 level low normal, 311.  Start replacement -CT head does show multifocal areas of  encephalomalacia involving right frontal, right occipital, right posterior parietal, and bilateral anterior temporal lobes.  It is possible that underlying encephalomalacia is contributing to her generalized dementia like symptoms as we have ruled out most other major etiologies and treated reversible causes; furthermore, husband states that patient has been in this mentation for approximately 1 month with not much improvement -Discussed with neurology on 03/10/2021.  Outpatient appointment to be arranged for follow-up for further neurocognitive testing  Cognitive impairment - See encephalopathy.  Suspicion at this time is underlying chronic cognitive impairment due to encephalomalacia seen on CT head. -Patient will follow up outpatient with neurology for further neurocognitive evaluation  Acute cystitis-resolved as of 03/07/2021 - Urine culture growing E. coli, resistant to Cipro - Started on Rocephin on admission and has been transitioned to Keflex to complete course  COVID-19 virus infection-resolved as of 03/11/2021 - s/p 2 days Remdesivir treatment - currently no respiratory symptoms.  - remained stable with ongoing monitoring  Seizure disorder (HCC) - Continue phenobarbital - Drug level normal on admission, 19.4  Essential hypertension - Controlled without medication  Hypokalemia - continue supplementation at d/c   OSA on CPAP - Continue nightly CPAP but patient typically refuses  GERD (gastroesophageal reflux disease) - Continue Protonix  Bilateral lower extremity edema - Stable and chronic.  History of lymphedema  Restless legs syndrome (RLS) - Continue pramipexole      Condition at discharge: stable  Exam Physical Exam Constitutional:      Appearance: Normal appearance.  HENT:     Head: Normocephalic and atraumatic.     Mouth/Throat:     Mouth: Mucous membranes are  moist.  Eyes:     Extraocular Movements: Extraocular movements intact.  Cardiovascular:      Rate and Rhythm: Normal rate and regular rhythm.  Pulmonary:     Effort: Pulmonary effort is normal.     Breath sounds: Normal breath sounds. No wheezing.  Abdominal:     General: Bowel sounds are normal. There is no distension.     Palpations: Abdomen is soft.     Tenderness: There is no abdominal tenderness.  Musculoskeletal:        General: Normal range of motion.     Cervical back: Normal range of motion and neck supple.  Skin:    General: Skin is warm and dry.  Neurological:     Mental Status: She is alert. She is disoriented.     Motor: No weakness.     Comments: Oriented to name only  Psychiatric:        Mood and Affect: Mood normal.        Behavior: Behavior normal.     Disposition: Skilled nursing facility  Discharge time: greater than 30 minutes.   Allergies as of 03/11/2021       Reactions   Compazine Shortness Of Breath   Prochlorperazine Maleate Shortness Of Breath   Topamax [topiramate] Hives, Rash   Codeine Sulfate Nausea Only   Other    NO MRI'S   Seasonal Ic [cholestatin] Hives   Adhesive [tape] Rash   Iodinated Diagnostic Agents Rash   Uncoded Allergy. Allergen: contrast dyes   Pedi-pre Tape Spray [wound Dressing Adhesive] Rash        Medication List     STOP taking these medications    cephALEXin 500 MG capsule Commonly known as: KEFLEX   ciprofloxacin 250 MG tablet Commonly known as: CIPRO   levocetirizine 5 MG tablet Commonly known as: XYZAL   metolazone 5 MG tablet Commonly known as: ZAROXOLYN   zinc gluconate 50 MG tablet       TAKE these medications    acetaminophen 325 MG tablet Commonly known as: TYLENOL Take 2 tablets (650 mg total) by mouth every 6 (six) hours as needed for mild pain or headache.   aspirin EC 81 MG tablet Take 1 tablet (81 mg total) by mouth 2 (two) times daily.   CALCIUM 600+D PO Take 1 tablet by mouth daily.   cholecalciferol 1000 units tablet Commonly known as: VITAMIN D Take 3,000 Units by  mouth daily.   Dermacloud Oint Apply 1 application topically in the morning and at bedtime. After each incontinent episode.   ferrous sulfate 325 (65 FE) MG tablet Take 1 tablet (325 mg total) by mouth daily with breakfast. What changed: how much to take   fluticasone 50 MCG/ACT nasal spray Commonly known as: FLONASE Place 2 sprays into the nose 2 (two) times daily.   gabapentin 100 MG capsule Commonly known as: NEURONTIN Take 100 mg by mouth 3 (three) times daily. What changed: Another medication with the same name was removed. Continue taking this medication, and follow the directions you see here.   guaiFENesin 100 MG/5ML liquid Commonly known as: ROBITUSSIN Take 10 mLs by mouth every 6 (six) hours as needed for cough or to loosen phlegm.   hydrOXYzine 25 MG tablet Commonly known as: ATARAX/VISTARIL Take 1 tablet (25 mg total) by mouth daily as needed for itching or anxiety.   magnesium oxide 400 MG tablet Commonly known as: MAG-OX Take 2 tablets (800 mg total) by mouth at bedtime.  montelukast 10 MG tablet Commonly known as: SINGULAIR Take 10 mg by mouth at bedtime.   nystatin powder Commonly known as: MYCOSTATIN/NYSTOP Apply 1 application topically 2 (two) times daily. Underneath breast and groin   PHENobarbital 64.8 MG tablet Commonly known as: LUMINAL Take 2 tablets (129.6 mg total) by mouth at bedtime.   potassium chloride SA 20 MEQ tablet Commonly known as: KLOR-CON Take 1 tablet (20 mEq total) by mouth daily.   Pramipexole Dihydrochloride 0.75 MG Tb24 Take 1 tablet (0.75 mg total) by mouth at bedtime. What changed: Another medication with the same name was removed. Continue taking this medication, and follow the directions you see here.   rosuvastatin 20 MG tablet Commonly known as: CRESTOR TAKE 1 TABLET (20 MG TOTAL) BY MOUTH DAILY. NEEDS APPOINTMENT FOR FUTURE REFILLS What changed: additional instructions   thiamine 100 MG tablet Take 1 tablet (100  mg total) by mouth daily.   torsemide 20 MG tablet Commonly known as: DEMADEX Take 20 mg by mouth daily.               Discharge Care Instructions  (From admission, onward)           Start     Ordered   03/11/21 0000  Discharge wound care:       Comments: Wound care to right lower extremity: Clean with soap and water, rinse, and pat dry.  Apply Eucerin cream to leg.  Fill defect with strip cut from 4 x 4 inch piece of Aquacel.  Top with ABD pad and secure with Kerlix roll gauze wrapped from just below toes to just below knee.  Top Kerlix with 6 inch Ace wrap applied in similar manner.  Place foot into Prevalon boot.   03/11/21 1247            CT Head Wo Contrast  Result Date: 03/01/2021 CLINICAL DATA:  Mental status change. EXAM: CT HEAD WITHOUT CONTRAST TECHNIQUE: Contiguous axial images were obtained from the base of the skull through the vertex without intravenous contrast. COMPARISON:  01/09/2021 FINDINGS: Brain: Multifocal areas of encephalomalacia are identified within the right frontal lobe, right occipital lobe, right posterior parietal lobe, and bilateral anterior temporal lobes. There is mild diffuse low-attenuation within the subcortical and periventricular white matter compatible with chronic microvascular disease. Increased volume of CSF spaces overlying the cerebral and cerebellar hemispheres compatible with diffuse brain atrophy. No signs of acute brain infarct, intracranial hemorrhage or mass. Vascular: Status post bilateral ICA and right M1 aneurysm clipping with right ICA stent. Skull: Negative for acute fracture or focal lesion. Bilateral frontoparietal craniotomies. Sinuses/Orbits: Paranasal sinuses and mastoid air cells appear clear. Other: None IMPRESSION: 1. No acute intracranial abnormalities. 2. Chronic small vessel ischemic disease and brain atrophy. 3. Status post bilateral ICA and right M1 aneurysm clipping with right ICA stent. Electronically Signed    By: Signa Kell M.D.   On: 03/01/2021 07:32   DG Chest Port 1 View  Result Date: 03/01/2021 CLINICAL DATA:  Altered mental status. EXAM: PORTABLE CHEST 1 VIEW COMPARISON:  01/18/2021 FINDINGS: The heart size and mediastinal contours are within normal limits. Low lung volumes. Both lungs are clear. The visualized skeletal structures are unremarkable. IMPRESSION: No active disease. Electronically Signed   By: Signa Kell M.D.   On: 03/01/2021 07:24   Results for orders placed or performed during the hospital encounter of 03/01/21  Culture, blood (routine x 2)     Status: None   Collection Time: 03/01/21  6:40 AM   Specimen: BLOOD  Result Value Ref Range Status   Specimen Description   Final    BLOOD BOTTLES DRAWN AEROBIC AND ANAEROBIC Performed at [facility], [address] [place], [place] [address]    Special Requests   Final    Blood Culture adequate volume LEFT ANTECUBITAL Performed at [facility], [address], [place], [place] [address]    Culture   Final    NO GROWTH 5 DAYS Performed at [facility], [address], [place], [place] [address]    Report Status 03/06/2021 FINAL  Final  Culture, blood (routine x 2)     Status: None   Collection Time: 03/01/21  6:45 AM   Specimen: BLOOD  Result Value Ref Range Status   Specimen Description   Final    BLOOD BOTTLES DRAWN AEROBIC ONLY Performed at [facility], [address], [place], [place] [address]    Special Requests   Final    Blood Culture results may not be optimal due to an inadequate volume of blood received in culture bottles BLOOD RIGHT FOREARM Performed at [facility], [address] [place], [place] [address]    Culture   Final    NO GROWTH 5 DAYS Performed at [facility], [address] [place], [place] [address]    Report Status 03/06/2021 FINAL  Final  Resp Panel by RT-PCR (Flu A&B, Covid)  Nasopharyngeal Swab     Status: Abnormal   Collection Time: 03/01/21  7:56 AM   Specimen: Nasopharyngeal Swab; Nasopharyngeal(NP) swabs in vial transport medium  Result Value Ref Range Status   SARS Coronavirus 2 by RT PCR POSITIVE (A) NEGATIVE Final    Comment: RESULT CALLED TO, READ BACK BY AND VERIFIED WITH: Cleora Fleet [id number] 03/01/2021 DBRADLEY (NOTE) SARS-CoV-2 target nucleic acids are DETECTED.  The SARS-CoV-2 RNA is generally detectable in upper respiratory specimens during the acute phase of infection. Positive results are indicative of the presence of the identified virus, but do not rule out bacterial infection or co-infection with other pathogens not detected by the test. Clinical correlation with patient history and other diagnostic information is necessary to determine patient infection status. The expected result is Negative.  Fact Sheet for Patients: BloggerCourse.com  Fact Sheet for Healthcare Providers: SeriousBroker.it  This test is not yet approved or cleared by the [place] FDA and  has been authorized for detection and/or diagnosis of SARS-CoV-2 by FDA under an Emergency Use Authorization (EUA).  This EUA will remain in effect (meaning this test c an be used) for the duration of  the COVID-19 declaration under Section 564(b)(1) of the Act, 21 U.S.C. section 360bbb-3(b)(1), unless the authorization is terminated or revoked sooner.     Influenza A by PCR NEGATIVE NEGATIVE Final   Influenza B by PCR NEGATIVE NEGATIVE Final    Comment: (NOTE) The Xpert Xpress SARS-CoV-2/FLU/RSV plus assay is intended as an aid in the diagnosis of influenza from Nasopharyngeal swab specimens and should not be used as a sole basis for treatment. Nasal washings and aspirates are unacceptable for Xpert Xpress SARS-CoV-2/FLU/RSV testing.  Fact Sheet for Patients: BloggerCourse.com  Fact Sheet for  Healthcare Providers: SeriousBroker.it  This test is not yet approved or cleared by the [place] FDA and has been authorized for detection and/or diagnosis of SARS-CoV-2 by FDA under an Emergency Use Authorization (EUA). This EUA will remain in effect (meaning this test can be used) for the  duration of the COVID-19 declaration under Section 564(b)(1) of the Act, 21 U.S.C. section 360bbb-3(b)(1), unless the authorization is terminated or revoked.  Performed at [facility], [address], [place], [place] [address]   Urine Culture     Status: Abnormal   Collection Time: 03/01/21  7:56 AM   Specimen: Urine, Clean Catch  Result Value Ref Range Status   Specimen Description   Final    URINE, CLEAN CATCH Performed at [facility], [address], [place], [place] [address]    Special Requests   Final    NONE Performed at [facility], [address], [place], [place] [address]    Culture >=100,000 COLONIES/mL ESCHERICHIA COLI (A)  Final   Report Status 03/03/2021 FINAL  Final   Organism ID, Bacteria ESCHERICHIA COLI (A)  Final      Susceptibility   Escherichia coli - MIC*    AMPICILLIN 16 INTERMEDIATE Intermediate     CEFAZOLIN <=4 SENSITIVE Sensitive     CEFEPIME <=0.12 SENSITIVE Sensitive     CEFTRIAXONE <=0.25 SENSITIVE Sensitive     CIPROFLOXACIN >=4 RESISTANT Resistant     GENTAMICIN <=1 SENSITIVE Sensitive     IMIPENEM <=0.25 SENSITIVE Sensitive     NITROFURANTOIN <=16 SENSITIVE Sensitive     TRIMETH/SULFA >=320 RESISTANT Resistant     AMPICILLIN/SULBACTAM <=2 SENSITIVE Sensitive     PIP/TAZO <=4 SENSITIVE Sensitive     * >=100,000 COLONIES/mL ESCHERICHIA COLI    Signed:  Lewie Chamber MD.  [facility] 03/11/2021, 12:49 PM

## 2021-03-11 NOTE — Progress Notes (Signed)
Report called to Nicholos Johns at [facility], questions and concerns were denied at this time. Patient remains stable with no changes to assessment. Pt is oriented to self disoriented place, time and situation and out of bed 1 assist with walker.

## 2021-03-11 NOTE — TOC Transition Note (Addendum)
Transition of Care [facility]) - CM/SW Discharge Note   Patient Details  Name: [name] MRN: [id number] Date of Birth: 12-19-1950  [facility] CM/SW Contact:  Lanier Clam, RN Phone Number: 03/11/2021, 10:38 AM   Clinical Narrative:  d/c today back to [facility]-auth#[id number]. COVID+ PTA. [facility] for transport.await rm# tel# for report.  12:56p-Going to [facility] d/c summary sent;going to rm#[id number],tel# report [phone]. [facility] for 2p pick up. No further CM needs.    Final next level of care: Skilled Nursing Facility Barriers to Discharge: No Barriers Identified   Patient Goals and CMS Choice Patient states their goals for this hospitalization and ongoing recovery are:: return back to [facility] CMS Medicare.gov Compare Post Acute Care list provided to:: Patient Represenative (must comment) Choice offered to / list presented to : Spouse  Discharge Placement              Patient chooses bed at: [facility] Patient to be transferred to facility by: [facility] Name of family member notified: Marshell Levan [phone] Patient and family notified of of transfer: 03/11/21  Discharge Plan and Services   Discharge Planning Services: CM Consult                                 Social Determinants of Health (SDOH) Interventions     Readmission Risk Interventions Readmission Risk Prevention Plan 03/09/2021 03/04/2021  Transportation Screening Complete Complete  PCP or Specialist Appt within 3-5 Days Complete -  HRI or Home Care Consult Complete -  Social Work Consult for Recovery Care Planning/Counseling Complete -  Palliative Care Screening Complete -  Medication Review Oceanographer) Complete Complete  PCP or Specialist appointment within 3-5 days of discharge - Complete  HRI or Home Care Consult - Complete  SW Recovery Care/Counseling Consult - Complete  Palliative Care Screening - Not Applicable  Skilled Nursing Facility - Not  Applicable  Some recent data might be hidden

## 2021-03-12 DIAGNOSIS — I5032 Chronic diastolic (congestive) heart failure: Secondary | ICD-10-CM | POA: Diagnosis not present

## 2021-03-12 DIAGNOSIS — J45909 Unspecified asthma, uncomplicated: Secondary | ICD-10-CM | POA: Diagnosis not present

## 2021-03-12 DIAGNOSIS — I10 Essential (primary) hypertension: Secondary | ICD-10-CM | POA: Diagnosis not present

## 2021-03-12 DIAGNOSIS — I890 Lymphedema, not elsewhere classified: Secondary | ICD-10-CM | POA: Diagnosis not present

## 2021-03-12 DIAGNOSIS — I609 Nontraumatic subarachnoid hemorrhage, unspecified: Secondary | ICD-10-CM | POA: Diagnosis not present

## 2021-03-12 DIAGNOSIS — L299 Pruritus, unspecified: Secondary | ICD-10-CM | POA: Diagnosis not present

## 2021-03-12 DIAGNOSIS — K529 Noninfective gastroenteritis and colitis, unspecified: Secondary | ICD-10-CM | POA: Diagnosis not present

## 2021-03-12 DIAGNOSIS — K219 Gastro-esophageal reflux disease without esophagitis: Secondary | ICD-10-CM | POA: Diagnosis not present

## 2021-03-12 DIAGNOSIS — G9340 Encephalopathy, unspecified: Secondary | ICD-10-CM | POA: Diagnosis not present

## 2021-03-13 DIAGNOSIS — L97919 Non-pressure chronic ulcer of unspecified part of right lower leg with unspecified severity: Secondary | ICD-10-CM | POA: Diagnosis not present

## 2021-03-14 DIAGNOSIS — G9340 Encephalopathy, unspecified: Secondary | ICD-10-CM | POA: Diagnosis not present

## 2021-03-14 DIAGNOSIS — I5032 Chronic diastolic (congestive) heart failure: Secondary | ICD-10-CM | POA: Diagnosis not present

## 2021-03-14 DIAGNOSIS — I10 Essential (primary) hypertension: Secondary | ICD-10-CM | POA: Diagnosis not present

## 2021-03-14 DIAGNOSIS — G894 Chronic pain syndrome: Secondary | ICD-10-CM | POA: Diagnosis not present

## 2021-03-17 ENCOUNTER — Telehealth: Payer: Self-pay | Admitting: Adult Health

## 2021-03-18 DIAGNOSIS — G894 Chronic pain syndrome: Secondary | ICD-10-CM | POA: Diagnosis not present

## 2021-03-18 DIAGNOSIS — I5032 Chronic diastolic (congestive) heart failure: Secondary | ICD-10-CM | POA: Diagnosis not present

## 2021-03-18 DIAGNOSIS — G4733 Obstructive sleep apnea (adult) (pediatric): Secondary | ICD-10-CM | POA: Diagnosis not present

## 2021-03-18 DIAGNOSIS — F419 Anxiety disorder, unspecified: Secondary | ICD-10-CM | POA: Diagnosis not present

## 2021-03-18 DIAGNOSIS — S8011XD Contusion of right lower leg, subsequent encounter: Secondary | ICD-10-CM | POA: Diagnosis not present

## 2021-03-18 DIAGNOSIS — J45909 Unspecified asthma, uncomplicated: Secondary | ICD-10-CM | POA: Diagnosis not present

## 2021-03-18 DIAGNOSIS — I10 Essential (primary) hypertension: Secondary | ICD-10-CM | POA: Diagnosis not present

## 2021-03-20 DIAGNOSIS — I10 Essential (primary) hypertension: Secondary | ICD-10-CM | POA: Diagnosis not present

## 2021-03-20 DIAGNOSIS — S8011XD Contusion of right lower leg, subsequent encounter: Secondary | ICD-10-CM | POA: Diagnosis not present

## 2021-03-20 DIAGNOSIS — I5032 Chronic diastolic (congestive) heart failure: Secondary | ICD-10-CM | POA: Diagnosis not present

## 2021-03-20 DIAGNOSIS — L97919 Non-pressure chronic ulcer of unspecified part of right lower leg with unspecified severity: Secondary | ICD-10-CM | POA: Diagnosis not present

## 2021-03-20 DIAGNOSIS — F419 Anxiety disorder, unspecified: Secondary | ICD-10-CM | POA: Diagnosis not present

## 2021-03-20 DIAGNOSIS — I890 Lymphedema, not elsewhere classified: Secondary | ICD-10-CM | POA: Diagnosis not present

## 2021-03-20 DIAGNOSIS — G894 Chronic pain syndrome: Secondary | ICD-10-CM | POA: Diagnosis not present

## 2021-03-21 DIAGNOSIS — L0390 Cellulitis, unspecified: Secondary | ICD-10-CM | POA: Diagnosis not present

## 2021-03-21 DIAGNOSIS — I5032 Chronic diastolic (congestive) heart failure: Secondary | ICD-10-CM | POA: Diagnosis not present

## 2021-03-21 DIAGNOSIS — R569 Unspecified convulsions: Secondary | ICD-10-CM | POA: Diagnosis not present

## 2021-03-21 DIAGNOSIS — G894 Chronic pain syndrome: Secondary | ICD-10-CM | POA: Diagnosis not present

## 2021-03-21 DIAGNOSIS — F419 Anxiety disorder, unspecified: Secondary | ICD-10-CM | POA: Diagnosis not present

## 2021-03-21 DIAGNOSIS — I890 Lymphedema, not elsewhere classified: Secondary | ICD-10-CM | POA: Diagnosis not present

## 2021-03-21 DIAGNOSIS — I10 Essential (primary) hypertension: Secondary | ICD-10-CM | POA: Diagnosis not present

## 2021-03-21 DIAGNOSIS — G9340 Encephalopathy, unspecified: Secondary | ICD-10-CM | POA: Diagnosis not present

## 2021-03-26 DIAGNOSIS — I609 Nontraumatic subarachnoid hemorrhage, unspecified: Secondary | ICD-10-CM | POA: Diagnosis not present

## 2021-03-26 DIAGNOSIS — S8011XD Contusion of right lower leg, subsequent encounter: Secondary | ICD-10-CM | POA: Diagnosis not present

## 2021-03-26 DIAGNOSIS — G894 Chronic pain syndrome: Secondary | ICD-10-CM | POA: Diagnosis not present

## 2021-03-26 DIAGNOSIS — E876 Hypokalemia: Secondary | ICD-10-CM | POA: Diagnosis not present

## 2021-03-26 DIAGNOSIS — I10 Essential (primary) hypertension: Secondary | ICD-10-CM | POA: Diagnosis not present

## 2021-03-26 DIAGNOSIS — D649 Anemia, unspecified: Secondary | ICD-10-CM | POA: Diagnosis not present

## 2021-03-27 DIAGNOSIS — G9341 Metabolic encephalopathy: Secondary | ICD-10-CM | POA: Diagnosis not present

## 2021-03-27 DIAGNOSIS — I5032 Chronic diastolic (congestive) heart failure: Secondary | ICD-10-CM | POA: Diagnosis not present

## 2021-03-27 DIAGNOSIS — S81801D Unspecified open wound, right lower leg, subsequent encounter: Secondary | ICD-10-CM | POA: Diagnosis not present

## 2021-03-27 DIAGNOSIS — G40909 Epilepsy, unspecified, not intractable, without status epilepticus: Secondary | ICD-10-CM | POA: Diagnosis not present

## 2021-03-27 DIAGNOSIS — G894 Chronic pain syndrome: Secondary | ICD-10-CM | POA: Diagnosis not present

## 2021-03-27 DIAGNOSIS — I110 Hypertensive heart disease with heart failure: Secondary | ICD-10-CM | POA: Diagnosis not present

## 2021-03-27 DIAGNOSIS — M170 Bilateral primary osteoarthritis of knee: Secondary | ICD-10-CM | POA: Diagnosis not present

## 2021-03-27 DIAGNOSIS — M19079 Primary osteoarthritis, unspecified ankle and foot: Secondary | ICD-10-CM | POA: Diagnosis not present

## 2021-03-27 DIAGNOSIS — G9389 Other specified disorders of brain: Secondary | ICD-10-CM | POA: Diagnosis not present

## 2021-04-02 DIAGNOSIS — E876 Hypokalemia: Secondary | ICD-10-CM | POA: Diagnosis not present

## 2021-04-02 DIAGNOSIS — D649 Anemia, unspecified: Secondary | ICD-10-CM | POA: Diagnosis not present

## 2021-04-02 DIAGNOSIS — E538 Deficiency of other specified B group vitamins: Secondary | ICD-10-CM | POA: Diagnosis not present

## 2021-04-02 DIAGNOSIS — E2839 Other primary ovarian failure: Secondary | ICD-10-CM | POA: Diagnosis not present

## 2021-04-02 DIAGNOSIS — N390 Urinary tract infection, site not specified: Secondary | ICD-10-CM | POA: Diagnosis not present

## 2021-04-02 DIAGNOSIS — E559 Vitamin D deficiency, unspecified: Secondary | ICD-10-CM | POA: Diagnosis not present

## 2021-04-02 DIAGNOSIS — Z79899 Other long term (current) drug therapy: Secondary | ICD-10-CM | POA: Diagnosis not present

## 2021-04-02 DIAGNOSIS — I890 Lymphedema, not elsewhere classified: Secondary | ICD-10-CM | POA: Diagnosis not present

## 2021-04-23 DIAGNOSIS — Z1231 Encounter for screening mammogram for malignant neoplasm of breast: Secondary | ICD-10-CM | POA: Diagnosis not present

## 2021-04-23 DIAGNOSIS — M85851 Other specified disorders of bone density and structure, right thigh: Secondary | ICD-10-CM | POA: Diagnosis not present

## 2021-04-23 DIAGNOSIS — M85852 Other specified disorders of bone density and structure, left thigh: Secondary | ICD-10-CM | POA: Diagnosis not present

## 2021-04-23 DIAGNOSIS — Z780 Asymptomatic menopausal state: Secondary | ICD-10-CM | POA: Diagnosis not present

## 2021-04-28 ENCOUNTER — Other Ambulatory Visit: Payer: Self-pay | Admitting: Cardiology

## 2021-05-11 HISTORY — PX: OTHER SURGICAL HISTORY: SHX169

## 2021-05-12 ENCOUNTER — Other Ambulatory Visit: Payer: Self-pay | Admitting: Cardiology

## 2021-05-26 ENCOUNTER — Other Ambulatory Visit: Payer: Self-pay | Admitting: Adult Health

## 2021-05-26 DIAGNOSIS — G9389 Other specified disorders of brain: Secondary | ICD-10-CM | POA: Diagnosis not present

## 2021-05-26 DIAGNOSIS — G2581 Restless legs syndrome: Secondary | ICD-10-CM | POA: Diagnosis not present

## 2021-05-26 DIAGNOSIS — F0280 Dementia in other diseases classified elsewhere without behavioral disturbance: Secondary | ICD-10-CM | POA: Diagnosis not present

## 2021-05-26 DIAGNOSIS — G9341 Metabolic encephalopathy: Secondary | ICD-10-CM | POA: Diagnosis not present

## 2021-05-26 DIAGNOSIS — I110 Hypertensive heart disease with heart failure: Secondary | ICD-10-CM | POA: Diagnosis not present

## 2021-05-26 DIAGNOSIS — G40909 Epilepsy, unspecified, not intractable, without status epilepticus: Secondary | ICD-10-CM | POA: Diagnosis not present

## 2021-05-26 DIAGNOSIS — I5032 Chronic diastolic (congestive) heart failure: Secondary | ICD-10-CM | POA: Diagnosis not present

## 2021-05-26 DIAGNOSIS — G894 Chronic pain syndrome: Secondary | ICD-10-CM | POA: Diagnosis not present

## 2021-05-26 DIAGNOSIS — S81801D Unspecified open wound, right lower leg, subsequent encounter: Secondary | ICD-10-CM | POA: Diagnosis not present

## 2021-05-26 NOTE — Telephone Encounter (Signed)
I called pt, she stated since 03/2021 she is not on horizant.  (They took her off from ED/ [facility]) she is not sure why.  She has been taking the pramipexole 0.75mg  qhs and 0.25 (1-2 tabs) at 1800.  She will run out tomorrow.  Megan NP is out , will address tomorrow. Has appt 06-24-2021 with Dr. Vickey Huger.  She is currently taking both strengths.  Please advise.

## 2021-05-26 NOTE — Telephone Encounter (Signed)
Pt requesting  refill for pramipexole (MIRAPEX) 0.25 MG tablet at [facility] [phone]  Did not see on pt's medication list, but was in last office visit

## 2021-05-27 MED ORDER — PRAMIPEXOLE DIHYDROCHLORIDE 0.25 MG PO TABS: ORAL_TABLET | ORAL | 0 refills | Status: DC

## 2021-05-28 ENCOUNTER — Encounter (HOSPITAL_BASED_OUTPATIENT_CLINIC_OR_DEPARTMENT_OTHER): Payer: Medicare PPO | Admitting: Internal Medicine

## 2021-05-28 DIAGNOSIS — S81801D Unspecified open wound, right lower leg, subsequent encounter: Secondary | ICD-10-CM | POA: Diagnosis not present

## 2021-05-28 DIAGNOSIS — I8310 Varicose veins of unspecified lower extremity with inflammation: Secondary | ICD-10-CM | POA: Diagnosis not present

## 2021-05-28 DIAGNOSIS — I890 Lymphedema, not elsewhere classified: Secondary | ICD-10-CM | POA: Diagnosis not present

## 2021-05-28 DIAGNOSIS — S81801A Unspecified open wound, right lower leg, initial encounter: Secondary | ICD-10-CM | POA: Diagnosis not present

## 2021-06-03 DIAGNOSIS — I890 Lymphedema, not elsewhere classified: Secondary | ICD-10-CM | POA: Diagnosis not present

## 2021-06-03 DIAGNOSIS — I8310 Varicose veins of unspecified lower extremity with inflammation: Secondary | ICD-10-CM | POA: Diagnosis not present

## 2021-06-03 DIAGNOSIS — I872 Venous insufficiency (chronic) (peripheral): Secondary | ICD-10-CM | POA: Diagnosis not present

## 2021-06-04 DIAGNOSIS — S81801A Unspecified open wound, right lower leg, initial encounter: Secondary | ICD-10-CM | POA: Diagnosis not present

## 2021-06-04 DIAGNOSIS — H5203 Hypermetropia, bilateral: Secondary | ICD-10-CM | POA: Diagnosis not present

## 2021-06-04 DIAGNOSIS — I872 Venous insufficiency (chronic) (peripheral): Secondary | ICD-10-CM | POA: Diagnosis not present

## 2021-06-04 DIAGNOSIS — I10 Essential (primary) hypertension: Secondary | ICD-10-CM | POA: Diagnosis not present

## 2021-06-04 DIAGNOSIS — H524 Presbyopia: Secondary | ICD-10-CM | POA: Diagnosis not present

## 2021-06-04 DIAGNOSIS — I8310 Varicose veins of unspecified lower extremity with inflammation: Secondary | ICD-10-CM | POA: Diagnosis not present

## 2021-06-04 DIAGNOSIS — I890 Lymphedema, not elsewhere classified: Secondary | ICD-10-CM | POA: Diagnosis not present

## 2021-06-04 DIAGNOSIS — H52223 Regular astigmatism, bilateral: Secondary | ICD-10-CM | POA: Diagnosis not present

## 2021-06-04 DIAGNOSIS — H35033 Hypertensive retinopathy, bilateral: Secondary | ICD-10-CM | POA: Diagnosis not present

## 2021-06-06 DIAGNOSIS — S81801D Unspecified open wound, right lower leg, subsequent encounter: Secondary | ICD-10-CM | POA: Diagnosis not present

## 2021-06-06 DIAGNOSIS — S91009A Unspecified open wound, unspecified ankle, initial encounter: Secondary | ICD-10-CM | POA: Diagnosis not present

## 2021-06-06 DIAGNOSIS — I8310 Varicose veins of unspecified lower extremity with inflammation: Secondary | ICD-10-CM | POA: Diagnosis not present

## 2021-06-06 DIAGNOSIS — S81009A Unspecified open wound, unspecified knee, initial encounter: Secondary | ICD-10-CM | POA: Diagnosis not present

## 2021-06-06 DIAGNOSIS — I890 Lymphedema, not elsewhere classified: Secondary | ICD-10-CM | POA: Diagnosis not present

## 2021-06-06 DIAGNOSIS — S81809A Unspecified open wound, unspecified lower leg, initial encounter: Secondary | ICD-10-CM | POA: Diagnosis not present

## 2021-06-09 ENCOUNTER — Institutional Professional Consult (permissible substitution): Payer: Medicare PPO | Admitting: Neurology

## 2021-06-11 DIAGNOSIS — I890 Lymphedema, not elsewhere classified: Secondary | ICD-10-CM | POA: Diagnosis not present

## 2021-06-11 DIAGNOSIS — I83019 Varicose veins of right lower extremity with ulcer of unspecified site: Secondary | ICD-10-CM | POA: Diagnosis not present

## 2021-06-11 DIAGNOSIS — S81801A Unspecified open wound, right lower leg, initial encounter: Secondary | ICD-10-CM | POA: Diagnosis not present

## 2021-06-11 DIAGNOSIS — I872 Venous insufficiency (chronic) (peripheral): Secondary | ICD-10-CM | POA: Diagnosis not present

## 2021-06-11 DIAGNOSIS — I8310 Varicose veins of unspecified lower extremity with inflammation: Secondary | ICD-10-CM | POA: Diagnosis not present

## 2021-06-11 DIAGNOSIS — L97919 Non-pressure chronic ulcer of unspecified part of right lower leg with unspecified severity: Secondary | ICD-10-CM | POA: Diagnosis not present

## 2021-06-12 ENCOUNTER — Telehealth: Payer: Self-pay | Admitting: *Deleted

## 2021-06-12 DIAGNOSIS — I639 Cerebral infarction, unspecified: Secondary | ICD-10-CM | POA: Diagnosis not present

## 2021-06-12 DIAGNOSIS — Z0289 Encounter for other administrative examinations: Secondary | ICD-10-CM

## 2021-06-12 DIAGNOSIS — S81801A Unspecified open wound, right lower leg, initial encounter: Secondary | ICD-10-CM | POA: Diagnosis not present

## 2021-06-12 NOTE — Telephone Encounter (Signed)
DMV form completed for seizures/ RLS.  To PCP for there part.

## 2021-06-13 DIAGNOSIS — I639 Cerebral infarction, unspecified: Secondary | ICD-10-CM | POA: Diagnosis not present

## 2021-06-13 DIAGNOSIS — S81801A Unspecified open wound, right lower leg, initial encounter: Secondary | ICD-10-CM | POA: Diagnosis not present

## 2021-06-18 DIAGNOSIS — S81801D Unspecified open wound, right lower leg, subsequent encounter: Secondary | ICD-10-CM | POA: Diagnosis not present

## 2021-06-18 DIAGNOSIS — I890 Lymphedema, not elsewhere classified: Secondary | ICD-10-CM | POA: Diagnosis not present

## 2021-06-18 DIAGNOSIS — I8310 Varicose veins of unspecified lower extremity with inflammation: Secondary | ICD-10-CM | POA: Diagnosis not present

## 2021-06-18 DIAGNOSIS — I872 Venous insufficiency (chronic) (peripheral): Secondary | ICD-10-CM | POA: Diagnosis not present

## 2021-06-18 DIAGNOSIS — S81801A Unspecified open wound, right lower leg, initial encounter: Secondary | ICD-10-CM | POA: Diagnosis not present

## 2021-06-18 NOTE — Telephone Encounter (Signed)
Dmv form signed by MM  NP and sent to medical records for processing.

## 2021-06-18 NOTE — Telephone Encounter (Signed)
Called patient to advise him that paperwork was completed and ready for pick up. Had to leave voicemail. Paperwork was placed with check in.

## 2021-06-19 ENCOUNTER — Encounter: Payer: Self-pay | Admitting: Neurology

## 2021-06-24 ENCOUNTER — Ambulatory Visit: Payer: Medicare PPO | Admitting: Neurology

## 2021-06-24 ENCOUNTER — Encounter: Payer: Self-pay | Admitting: Neurology

## 2021-06-24 VITALS — BP 137/68 | HR 78 | Ht 62.0 in | Wt 239.0 lb

## 2021-06-24 DIAGNOSIS — D518 Other vitamin B12 deficiency anemias: Secondary | ICD-10-CM

## 2021-06-24 DIAGNOSIS — I720 Aneurysm of carotid artery: Secondary | ICD-10-CM | POA: Diagnosis not present

## 2021-06-24 DIAGNOSIS — G9389 Other specified disorders of brain: Secondary | ICD-10-CM

## 2021-06-24 DIAGNOSIS — E876 Hypokalemia: Secondary | ICD-10-CM

## 2021-06-24 DIAGNOSIS — N179 Acute kidney failure, unspecified: Secondary | ICD-10-CM

## 2021-06-24 DIAGNOSIS — D649 Anemia, unspecified: Secondary | ICD-10-CM | POA: Insufficient documentation

## 2021-06-24 DIAGNOSIS — G9340 Encephalopathy, unspecified: Secondary | ICD-10-CM | POA: Diagnosis not present

## 2021-06-24 DIAGNOSIS — R0609 Other forms of dyspnea: Secondary | ICD-10-CM
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
## 2021-06-24 NOTE — Patient Instructions (Signed)
Toxic Metabolic Encephalopathy Toxic metabolic encephalopathy (TME) is a type of brain disorder caused by a change in brain chemistry. This condition may result from illnesses or conditions that cause an imbalance of fluid, minerals (electrolytes), and other substances in the body. This imbalance can affect the way the brain functions. The condition is not caused by brain damage or brain disease. TME can cause confusion and other mental disturbances, which are generally referred to as delirium. Untreated delirium may lead to permanent mental changes or worsening medical conditions. Untreated delirium is a life-threatening condition that may need to be treated in the hospital. What are the causes? Possible causes of TME that can lead to delirium include: High or low levels of any of the following substances in the blood: Calcium. Salt (sodium). Sugar (glucose). Magnesium. Phosphate. Not having enough oxygen in the blood. Changes in the acid level (pH) of the blood. Certain medicines, such as steroids, sedatives, sleeping aids, or pain medicines. High body temperature or certain infections. Dehydration. Short-term (acute) or long-term (chronic) disease of the kidney or liver. Low levels of B vitamins. This can result from alcohol abuse. What increases the risk? You are more likely to develop this condition if you: Have chronic medical problems, such as diabetes, liver disease, kidney disease, heart disease, or lung disease. Had recent surgery. Are in the hospital, especially in intensive care. Are elderly, have dementia, or live in a nursing home. Are not getting enough fluids. Have poor nutrition. Abuse alcohol or have alcohol withdrawal. What are the signs or symptoms? Symptoms of TME may include: Not being able to stay awake (drowsiness) or even loss of consciousness (coma). Shaking that you cannot control (tremors) or muscle twitching. Clumsiness or weakness. Seizures. Symptoms of  delirium caused by TME include: Confusion, not knowing where you are (disorientation), poor judgment, memory loss, or poor attention and concentration. Decreased alertness. Changes in speech, such as saying things that do not make sense. Psychiatric symptoms, such as seeing or hearing things that are not real (hallucinations), false beliefs (delusions), or general mistrust of others (paranoia). Mood changes like depression, anxiety, irritability, or hyperactivity. Avoiding other people. Changes in eating and sleeping patterns. Delirium may come and go. Symptoms of delirium may start suddenly or gradually, and they often get worse at night. How is this diagnosed? This condition is diagnosed based on: Your symptoms and behavior. An exam to check how you are thinking, feeling, and behaving (mental status exam). To diagnose delirium, the mental status exam must rule out other possible causes of TME, and it must show: Changes in attention and awareness. Changes that develop over a short period of time and tend to come and go. Changes in memory, language, and thinking that were not present before. A physical exam and medical history. Imaging tests, such as an MRI or a CT scan. Blood tests to: Measure liver and kidney function. Check B vitamin levels. Check for changes in acid levels (pH) and changes in calcium, sodium, or magnesium levels in the blood. Measure your blood glucose. Measure your blood oxygen level. How is this treated? Treatment for TME depends on the cause, and it may include: Getting fluids through an IV. Regulating calcium, sodium, glucose, or magnesium levels in the body. Getting oxygen. Improving nutrition. Treating liver or kidney disease. Adjusting certain medicines. Treating infections. If the cause is found and treated, delirium usually improves. Managing delirium may include: Keeping the room well-lit and quiet. Using calendars, pictures, and clocks to prevent  disorientation. Having  frequent checks from nursing staff and visits from caregivers. Wearing eyeglasses or a hearing aid, if needed. Physical therapy. Medicine to treat agitation, anxiety, hallucinations, or delusions. Follow these instructions at home: Medicines Take over-the-counter and prescription medicines only as told by your health care provider. Do not start taking any new medicines, including over-the-counter medicines, without first checking with your health care provider. Eating and drinking  Drink enough fluid to keep your urine pale yellow. Follow a healthy diet. Do not skip meals. Do not drink alcohol. General instructions Go to bed at the same time every night. Return to your normal activities as told by your health care provider. Ask your health care provider what activities are safe for you. Keep all follow-up visits. This is important. Contact a health care provider if: You have a fever. You are unable to feed yourself or hydrate yourself. You start to feel clumsy. You start to have tremors or weakness. Get help right away if: You have a seizure. You lose consciousness. You have trouble breathing. You feel unable to care for yourself at home. You become disoriented at home. These symptoms may represent a serious problem that is an emergency. Do not wait to see if the symptoms will go away. Get medical help right away. Call your local emergency services (911 in the [place] Do not drive yourself to the hospital. Summary Toxic metabolic encephalopathy (TME) is a brain disorder that may result from illnesses or conditions that cause an imbalance of fluid, minerals (electrolytes), and other substances in the body that affect the way the brain functions. Your health care provider will diagnose TME based on your symptoms, behavior, physical exam and medical history, imaging, and blood tests. Your health care provider will also do an exam to check how you are thinking,  feeling, and behaving (mental status exam). TME is treated by identifying and correcting the underlying cause. This information is not intended to replace advice given to you by your health care provider. Make sure you discuss any questions you have with your health care provider. Document Revised: 02/13/2020 Document Reviewed: 02/13/2020 Elsevier Patient Education  2022 [employer].

## 2021-06-25 DIAGNOSIS — I890 Lymphedema, not elsewhere classified: Secondary | ICD-10-CM | POA: Diagnosis not present

## 2021-06-25 DIAGNOSIS — I872 Venous insufficiency (chronic) (peripheral): Secondary | ICD-10-CM | POA: Diagnosis not present

## 2021-06-25 DIAGNOSIS — L97812 Non-pressure chronic ulcer of other part of right lower leg with fat layer exposed: Secondary | ICD-10-CM | POA: Diagnosis not present

## 2021-06-25 DIAGNOSIS — L97819 Non-pressure chronic ulcer of other part of right lower leg with unspecified severity: Secondary | ICD-10-CM | POA: Diagnosis not present

## 2021-06-25 DIAGNOSIS — I83018 Varicose veins of right lower extremity with ulcer other part of lower leg: Secondary | ICD-10-CM | POA: Diagnosis not present

## 2021-06-25 LAB — COMPREHENSIVE METABOLIC PANEL
ALT: 16 IU/L (ref 0–32)
AST: 22 IU/L (ref 0–40)
Albumin/Globulin Ratio: 1.5 (ref 1.2–2.2)
Albumin: 4.4 g/dL (ref 3.8–4.8)
Alkaline Phosphatase: 93 IU/L (ref 44–121)
BUN/Creatinine Ratio: 15 (ref 12–28)
BUN: 12 mg/dL (ref 8–27)
Bilirubin Total: <0.2 mg/dL (ref 0.0–1.2)
CO2: 23 mmol/L (ref 20–29)
Calcium: 9.7 mg/dL (ref 8.7–10.3)
Chloride: 102 mmol/L (ref 96–106)
Creatinine, Ser: 0.81 mg/dL (ref 0.57–1.00)
Globulin, Total: 3 g/dL (ref 1.5–4.5)
Glucose: 108 mg/dL — ABNORMAL HIGH (ref 70–99)
Potassium: 4.3 mmol/L (ref 3.5–5.2)
Sodium: 137 mmol/L (ref 134–144)
Total Protein: 7.4 g/dL (ref 6.0–8.5)
eGFR: 79 mL/min/{1.73_m2} (ref >59)

## 2021-06-26 ENCOUNTER — Telehealth: Payer: Self-pay | Admitting: Neurology

## 2021-06-26 ENCOUNTER — Other Ambulatory Visit: Payer: Self-pay | Admitting: Adult Health

## 2021-06-26 ENCOUNTER — Other Ambulatory Visit: Payer: Self-pay | Admitting: Cardiology

## 2021-06-26 ENCOUNTER — Encounter: Payer: Self-pay | Admitting: Neurology

## 2021-06-26 NOTE — Telephone Encounter (Signed)
Received refill request for phenobarbital.  Last OV was on 06/24/20.  Next OV is scheduled for 07/03/21 .  Last RX was written on 05/25/21 for 60 tabs.   [place] [facility] Database has been reviewed.

## 2021-06-26 NOTE — Addendum Note (Signed)
Addended by: Melvyn Novas on: 06/26/2021 08:59 AM   Modules accepted: Orders

## 2021-06-26 NOTE — Telephone Encounter (Signed)
[facility]ana pending uploaded notes on the portal

## 2021-06-30 NOTE — Telephone Encounter (Signed)
[name]: [id number] (exp. 10/24/21 to 07/26/21) order sent to GI, they will reach out to the patient to schedule.

## 2021-07-02 DIAGNOSIS — I8310 Varicose veins of unspecified lower extremity with inflammation: Secondary | ICD-10-CM | POA: Diagnosis not present

## 2021-07-02 DIAGNOSIS — I890 Lymphedema, not elsewhere classified: Secondary | ICD-10-CM | POA: Diagnosis not present

## 2021-07-02 DIAGNOSIS — S81801A Unspecified open wound, right lower leg, initial encounter: Secondary | ICD-10-CM | POA: Diagnosis not present

## 2021-07-03 ENCOUNTER — Ambulatory Visit: Payer: Medicare PPO | Admitting: Neurology

## 2021-07-03 DIAGNOSIS — E876 Hypokalemia: Secondary | ICD-10-CM

## 2021-07-03 DIAGNOSIS — R4182 Altered mental status, unspecified: Secondary | ICD-10-CM | POA: Diagnosis not present

## 2021-07-03 DIAGNOSIS — R0609 Other forms of dyspnea: Secondary | ICD-10-CM

## 2021-07-03 DIAGNOSIS — D518 Other vitamin B12 deficiency anemias: Secondary | ICD-10-CM

## 2021-07-03 DIAGNOSIS — N179 Acute kidney failure, unspecified: Secondary | ICD-10-CM

## 2021-07-03 DIAGNOSIS — G9340 Encephalopathy, unspecified: Secondary | ICD-10-CM

## 2021-07-04 DIAGNOSIS — K529 Noninfective gastroenteritis and colitis, unspecified: Secondary | ICD-10-CM | POA: Diagnosis not present

## 2021-07-04 DIAGNOSIS — Z1211 Encounter for screening for malignant neoplasm of colon: Secondary | ICD-10-CM | POA: Diagnosis not present

## 2021-07-04 NOTE — Procedures (Signed)
° ° °  History:  71 year old woman with OSA with altered mental status   EEG classification:  Awake and asleep  Description of the recording: The background rhythms of this recording consists of a fairly well modulated medium amplitude background activity of 6-7 Hz. As the record progresses, the patient initially is in the waking state, but appears to enter the early stage II sleep during the recording, with rudimentary sleep spindles and vertex sharp wave activity seen. During the wakeful state, photic stimulation is performed, and no abnormal responses were seen. Hyperventilation was not performed. No epileptiform discharges seen during this recording. There was no focal slowing. EKG monitor shows no evidence of cardiac rhythm abnormalities with a heart rate of 66.  Impression: This is an abnormal EEG recording in the waking and sleeping state due to mild diffuse slowing. Diffuse slowing is consistent with a generalized brain dysfunction such as in encephalopathy.   Alric Ran, MD [facility]

## 2021-07-04 NOTE — Progress Notes (Signed)
This is an abnormal EEG recording in the waking and sleeping state due to mild diffuse slowing. Diffuse slowing is consistent with a generalized brain dysfunction such as in encephalopathy. There were no longer triphasic waves seen, but the overall EEG is still not normal.

## 2021-07-07 ENCOUNTER — Ambulatory Visit: Payer: Medicare PPO | Admitting: Cardiology

## 2021-07-07 DIAGNOSIS — I890 Lymphedema, not elsewhere classified: Secondary | ICD-10-CM | POA: Diagnosis not present

## 2021-07-07 DIAGNOSIS — R0989 Other specified symptoms and signs involving the circulatory and respiratory systems: Secondary | ICD-10-CM | POA: Diagnosis not present

## 2021-07-07 DIAGNOSIS — I872 Venous insufficiency (chronic) (peripheral): Secondary | ICD-10-CM | POA: Diagnosis not present

## 2021-07-07 DIAGNOSIS — I8310 Varicose veins of unspecified lower extremity with inflammation: Secondary | ICD-10-CM | POA: Diagnosis not present

## 2021-07-07 DIAGNOSIS — Z6841 Body Mass Index (BMI) 40.0 and over, adult: Secondary | ICD-10-CM | POA: Diagnosis not present

## 2021-07-07 DIAGNOSIS — S81801A Unspecified open wound, right lower leg, initial encounter: Secondary | ICD-10-CM | POA: Diagnosis not present

## 2021-07-08 ENCOUNTER — Telehealth: Payer: Self-pay | Admitting: *Deleted

## 2021-07-08 NOTE — Telephone Encounter (Signed)
-----   Message from Larey Seat, MD sent at 07/04/2021 11:33 AM EST ----- This is an abnormal EEG recording in the waking and sleeping state due to mild diffuse slowing. Diffuse slowing is consistent with a generalized brain dysfunction such as in encephalopathy. There were no longer triphasic waves seen, but the overall EEG is still not normal.

## 2021-07-08 NOTE — Telephone Encounter (Signed)
Called pt, went over results per Dr. Vickey Huger. She received on mychart as well. She is comfortable lowering dose of phenobarbital. She is currently taking 64.8mg , 2 tabs po qhs. She has had no seizures since 1992. She is not working and not driving right now d/t lymphedema. She is ok with [place] sending her mychart message with what lower dose Dr. Vickey Huger wants to put her on.   She also asked about CT head/neck w/wo. She has allergy to contrast dye (hives only). She states Dr. Corliss Skains has ordered oral prednisone for her to take prior to scan w/ dye and she tolerates tests ok. Aware I will ask MD about this and include this in mychart message I send her back as well.

## 2021-07-09 ENCOUNTER — Other Ambulatory Visit: Payer: Self-pay | Admitting: *Deleted

## 2021-07-09 DIAGNOSIS — I890 Lymphedema, not elsewhere classified: Secondary | ICD-10-CM | POA: Diagnosis not present

## 2021-07-09 DIAGNOSIS — I8310 Varicose veins of unspecified lower extremity with inflammation: Secondary | ICD-10-CM | POA: Diagnosis not present

## 2021-07-09 DIAGNOSIS — L97919 Non-pressure chronic ulcer of unspecified part of right lower leg with unspecified severity: Secondary | ICD-10-CM | POA: Diagnosis not present

## 2021-07-09 DIAGNOSIS — S81801A Unspecified open wound, right lower leg, initial encounter: Secondary | ICD-10-CM | POA: Diagnosis not present

## 2021-07-09 DIAGNOSIS — I83019 Varicose veins of right lower extremity with ulcer of unspecified site: Secondary | ICD-10-CM | POA: Diagnosis not present

## 2021-07-09 DIAGNOSIS — R569 Unspecified convulsions: Secondary | ICD-10-CM

## 2021-07-09 DIAGNOSIS — I872 Venous insufficiency (chronic) (peripheral): Secondary | ICD-10-CM | POA: Diagnosis not present

## 2021-07-09 MED ORDER — PHENOBARBITAL 64.8 MG PO TABS: 64.8000 mg | ORAL_TABLET | Freq: Every day | ORAL | 5 refills | Status: DC

## 2021-07-14 ENCOUNTER — Telehealth: Payer: Self-pay

## 2021-07-14 MED ORDER — DIPHENHYDRAMINE HCL 50 MG PO TABS: 50.0000 mg | ORAL_TABLET | Freq: Once | ORAL | 0 refills | Status: DC

## 2021-07-14 MED ORDER — PREDNISONE 50 MG PO TABS: ORAL_TABLET | ORAL | 0 refills | Status: DC

## 2021-07-14 NOTE — Telephone Encounter (Signed)
Phone call to patient to review instructions for 13 hr prep for CT w/ contrast on 07/21/21 at 3:40 PM. Prescription called into [facility]. Pt aware and verbalized understanding of instructions. ?Prescription: ?Pt to take 50 mg of prednisone on 07/21/21 at 2:40 AM, 50 mg of prednisone on 07/21/21 at 8:40 AM, and 50 mg of prednisone on 07/21/21 at 2:40 PM. Pt is also to take 50 mg of benadryl on 07/21/21 at 2:40 PM. Please call [phone] with any questions.   ? ?Benadryl also sent in as a prescription, per the pts request. Pt advised not to drive the day of taking this medication as it mau cause drowsiness. Pt verbalized understanding.   ?

## 2021-07-21 ENCOUNTER — Inpatient Hospital Stay: Admission: RE | Admit: 2021-07-21 | Payer: Medicare PPO | Source: Ambulatory Visit

## 2021-07-21 ENCOUNTER — Ambulatory Visit
Admission: RE | Admit: 2021-07-21 | Discharge: 2021-07-21 | Disposition: A | Discharge status: Home / Self Care | Payer: Medicare PPO | Source: Ambulatory Visit | Attending: Neurology | Admitting: Neurology

## 2021-07-21 DIAGNOSIS — I720 Aneurysm of carotid artery: Secondary | ICD-10-CM | POA: Diagnosis not present

## 2021-07-21 DIAGNOSIS — I671 Cerebral aneurysm, nonruptured: Secondary | ICD-10-CM | POA: Diagnosis not present

## 2021-07-21 DIAGNOSIS — N179 Acute kidney failure, unspecified: Secondary | ICD-10-CM

## 2021-07-21 DIAGNOSIS — G9340 Encephalopathy, unspecified: Secondary | ICD-10-CM

## 2021-07-21 DIAGNOSIS — E876 Hypokalemia: Secondary | ICD-10-CM

## 2021-07-21 DIAGNOSIS — R0609 Other forms of dyspnea: Secondary | ICD-10-CM

## 2021-07-21 DIAGNOSIS — G9389 Other specified disorders of brain: Secondary | ICD-10-CM

## 2021-07-21 MED ORDER — IOPAMIDOL (ISOVUE-370) INJECTION 76%
75.0000 mL | Freq: Once | INTRAVENOUS | Status: AC | PRN
  Administered 2021-07-21: 75 mL via INTRAVENOUS

## 2021-07-22 NOTE — Telephone Encounter (Signed)
All this means is stable findings, unchanged over years- the youngest area of the encephalomalacia ( =the scars or injuries to the brain ) is still months or years old. That is the occipital area right brain.  ? ?There was no fresh/ acute injury seen. All these lesions are scarred over/ healed.  ?

## 2021-07-22 NOTE — Progress Notes (Signed)
.   Stable appearance of 8 mm fusiform distal right ICA aneurysm with ?adjacent aneurysm clip. ?2. Residual 4 mm right MCA bifurcation aneurysm is also stable. Both ?residual aneurysms are stable since 2014. ?3. Aneurysm clip at the left ICA terminus without residual recurrent ?aneurysm. ?4. No acute intracranial abnormality or significant interval change. ?5. 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 ?6. Stable encephalomalacia involving the right greater than left ?frontal and temporal lobes. ? ?No fresh / acute findings. The occipital lobe infarct is at least 25 months old.  ??

## 2021-07-23 DIAGNOSIS — I83019 Varicose veins of right lower extremity with ulcer of unspecified site: Secondary | ICD-10-CM | POA: Diagnosis not present

## 2021-07-23 DIAGNOSIS — I890 Lymphedema, not elsewhere classified: Secondary | ICD-10-CM | POA: Diagnosis not present

## 2021-07-23 DIAGNOSIS — I8310 Varicose veins of unspecified lower extremity with inflammation: Secondary | ICD-10-CM | POA: Diagnosis not present

## 2021-07-23 DIAGNOSIS — I878 Other specified disorders of veins: Secondary | ICD-10-CM | POA: Diagnosis not present

## 2021-07-23 DIAGNOSIS — I83018 Varicose veins of right lower extremity with ulcer other part of lower leg: Secondary | ICD-10-CM | POA: Diagnosis not present

## 2021-07-23 DIAGNOSIS — I872 Venous insufficiency (chronic) (peripheral): Secondary | ICD-10-CM | POA: Diagnosis not present

## 2021-07-23 DIAGNOSIS — L97919 Non-pressure chronic ulcer of unspecified part of right lower leg with unspecified severity: Secondary | ICD-10-CM | POA: Diagnosis not present

## 2021-07-23 DIAGNOSIS — S81801A Unspecified open wound, right lower leg, initial encounter: Secondary | ICD-10-CM | POA: Diagnosis not present

## 2021-07-29 DIAGNOSIS — K529 Noninfective gastroenteritis and colitis, unspecified: Secondary | ICD-10-CM | POA: Diagnosis not present

## 2021-07-29 DIAGNOSIS — E538 Deficiency of other specified B group vitamins: Secondary | ICD-10-CM | POA: Diagnosis not present

## 2021-07-29 DIAGNOSIS — E559 Vitamin D deficiency, unspecified: Secondary | ICD-10-CM | POA: Diagnosis not present

## 2021-07-29 DIAGNOSIS — N3000 Acute cystitis without hematuria: Secondary | ICD-10-CM | POA: Diagnosis not present

## 2021-07-29 DIAGNOSIS — Z0000 Encounter for general adult medical examination without abnormal findings: Secondary | ICD-10-CM | POA: Diagnosis not present

## 2021-07-29 DIAGNOSIS — Z79899 Other long term (current) drug therapy: Secondary | ICD-10-CM | POA: Diagnosis not present

## 2021-07-29 DIAGNOSIS — G40909 Epilepsy, unspecified, not intractable, without status epilepticus: Secondary | ICD-10-CM | POA: Diagnosis not present

## 2021-07-29 DIAGNOSIS — E7800 Pure hypercholesterolemia, unspecified: Secondary | ICD-10-CM | POA: Diagnosis not present

## 2021-07-29 DIAGNOSIS — D692 Other nonthrombocytopenic purpura: Secondary | ICD-10-CM | POA: Diagnosis not present

## 2021-08-04 ENCOUNTER — Telehealth: Payer: Self-pay | Admitting: Neurology

## 2021-08-04 MED ORDER — BELSOMRA 10 MG PO TABS: 10.0000 mg | ORAL_TABLET | Freq: Every evening | ORAL | 1 refills | Status: DC | PRN

## 2021-08-04 NOTE — Telephone Encounter (Signed)
THE PATIENT WAS SEEN 06-24-2021  for follow up on an acute encephalopathy, abnormal brain images with aneurysm clips and encephalomalacia.  ?She endorses that she has recently been in and out of the hospital due to mental status changes and hypokalemia. She was also experiencing issues with her lymphedema in her RLE.  ?Repeated labs show no liver or kidney impairment. EEG was mildy slowed, no triphasic waves, no seizure activity. ?She has had no seizures since 1992. She is not working and not driving right now d/t lymphedema. ? ?Remote seizures documented  , no seizure activity seen in conjunction with the  encephalopathy- she was still on Phenobarbital which I want to try to wean her off-  she slept better when she was on her previous dose of Phenobarbital. I reduced the dose to one from 2 capsules at night time po.  ?Her phenobarb level is expected to be low.  ? ? ?Had in late 2022 been treated for both; a UTI and COVID ( back to back- 2 weeks)  Shad Hypokalemia.  She had  been admitted in the past with altered mental status but had sepsis at that time.  ?Horizant - a high dose gabapentin had been exchanged in 2021 for regular acting gabapentin at only 100 mg tid po.  ?Remains on mirapex.  ? ? ?EMMA, RN :  ? ?PLAN: I like to reduce Phenobarbital further ( if possible and if tolerated) .  ? ?I will offer her a sleep test to see if there is another component to her insomnia and if she can't sleep without her  previous dose of Phenobarbitol, we can offer non addictive Belsomra. ? I will prescribe 10 mg Belsomra per night and also contact drug rep for samples.  ? ?And if we have to go back up on phenobarbital, we know at least that your liver function is able to handle it.  ? ? ?Melvyn Novas, MD  ? ? ? ?

## 2021-08-04 NOTE — Telephone Encounter (Signed)
Pt said, had lab work done, showed had low level of  PHENobarbital (LUMINAL) 64.8 MG tablet in pt's blood. PCP recommended pt call neurologist to let her know ;to see what she would like to do. Would like a call from the nurse. ?

## 2021-08-04 NOTE — Telephone Encounter (Signed)
Sent mychart message to pt asking for copy of recent labs. I do not see anything in epic. Waiting on response. ?

## 2021-08-05 ENCOUNTER — Other Ambulatory Visit: Payer: Self-pay | Admitting: *Deleted

## 2021-08-05 MED ORDER — PHENOBARBITAL 32.4 MG PO TABS: 32.4000 mg | ORAL_TABLET | Freq: Every day | ORAL | 3 refills | Status: DC

## 2021-08-05 NOTE — Telephone Encounter (Signed)
I sent pt mychart message. I did not message about Belsomra as pt mentioned no issue with sleep. Discussed this with MD yesterday as well.  ?Waiting on response about reduce of phenobarbital dose and if she is ok with proceeding w/ home sleep study. ?

## 2021-08-06 DIAGNOSIS — I890 Lymphedema, not elsewhere classified: Secondary | ICD-10-CM | POA: Diagnosis not present

## 2021-08-06 DIAGNOSIS — L97919 Non-pressure chronic ulcer of unspecified part of right lower leg with unspecified severity: Secondary | ICD-10-CM | POA: Diagnosis not present

## 2021-08-06 DIAGNOSIS — I83019 Varicose veins of right lower extremity with ulcer of unspecified site: Secondary | ICD-10-CM | POA: Diagnosis not present

## 2021-08-06 DIAGNOSIS — I872 Venous insufficiency (chronic) (peripheral): Secondary | ICD-10-CM | POA: Diagnosis not present

## 2021-08-06 DIAGNOSIS — I8310 Varicose veins of unspecified lower extremity with inflammation: Secondary | ICD-10-CM | POA: Diagnosis not present

## 2021-08-06 DIAGNOSIS — S81801A Unspecified open wound, right lower leg, initial encounter: Secondary | ICD-10-CM | POA: Diagnosis not present

## 2021-08-06 NOTE — Telephone Encounter (Signed)
We will get her scheduled as soon as we receive insurance authorization. We did not receive the orders from October due to them being mistakenly linked to an office visit appointment. ?

## 2021-08-20 ENCOUNTER — Ambulatory Visit (INDEPENDENT_AMBULATORY_CARE_PROVIDER_SITE_OTHER): Payer: Medicare PPO | Admitting: Neurology

## 2021-08-20 DIAGNOSIS — G4733 Obstructive sleep apnea (adult) (pediatric): Secondary | ICD-10-CM

## 2021-08-20 DIAGNOSIS — I83018 Varicose veins of right lower extremity with ulcer other part of lower leg: Secondary | ICD-10-CM | POA: Diagnosis not present

## 2021-08-20 DIAGNOSIS — G9341 Metabolic encephalopathy: Secondary | ICD-10-CM

## 2021-08-20 DIAGNOSIS — L97812 Non-pressure chronic ulcer of other part of right lower leg with fat layer exposed: Secondary | ICD-10-CM | POA: Diagnosis not present

## 2021-08-20 DIAGNOSIS — I83019 Varicose veins of right lower extremity with ulcer of unspecified site: Secondary | ICD-10-CM | POA: Diagnosis not present

## 2021-08-20 DIAGNOSIS — I872 Venous insufficiency (chronic) (peripheral): Secondary | ICD-10-CM | POA: Diagnosis not present

## 2021-08-20 DIAGNOSIS — R6510 Systemic inflammatory response syndrome (SIRS) of non-infectious origin without acute organ dysfunction: Secondary | ICD-10-CM

## 2021-08-20 DIAGNOSIS — R4189 Other symptoms and signs involving cognitive functions and awareness: Secondary | ICD-10-CM

## 2021-08-20 DIAGNOSIS — I890 Lymphedema, not elsewhere classified: Secondary | ICD-10-CM | POA: Diagnosis not present

## 2021-08-20 DIAGNOSIS — L97919 Non-pressure chronic ulcer of unspecified part of right lower leg with unspecified severity: Secondary | ICD-10-CM | POA: Diagnosis not present

## 2021-08-26 ENCOUNTER — Other Ambulatory Visit: Payer: Self-pay | Admitting: Adult Health

## 2021-08-26 DIAGNOSIS — G2581 Restless legs syndrome: Secondary | ICD-10-CM

## 2021-08-26 NOTE — Progress Notes (Signed)
? ?  ?  ?[facility] ?  ?HOME SLEEP TEST REPORT ( by Watch PAT)   ?STUDY DATA from 08-26-2021:   ? ?  ?ORDERING CLINICIAN: Melvyn Novas, MD  ?REFERRING CLINICIAN: Butch Penny, NP  ?  ?CLINICAL INFORMATION/HISTORY: 06-24-2021 ?71 year old woman with OSA  on CPAP of 12 cm water, presenting post ED visit for altered mental status. History of RLS and on seizure medication. History of encephalomalacia, patient was seen in ED for Covid and for UTI . ?  ?Epworth sleepiness score: 11/24. ?  ?BMI:  47 kg/m? ?  ?Neck Circumference: na ?  ?FINDINGS: ?  ?Sleep Summary: ?  ?Total Recording Time (hours, min): 9 hours and 23 minutes     ?  ?Total Sleep Time (hours, min):   8 hours 45 minutes            ?  ?Percent REM (%):   23.6%                                   ?  ?Respiratory Indices: ?  ?Calculated pAHI (per hour):    43.8/h                       ?  ?REM pAHI:     49.8/h                                          ?  ?NREM pAHI:    42/h                        ?  ?Positional AHI:   All recorded sleep was in supine position. ? ?Snoring level was very mild with a mean volume of 40 dB which is at the threshold.  Only 9% of sleep were accompanied by snoring.                                              ?  ?Oxygen Saturation Statistics: ? O2 Saturation Range (%):      Between a nadir of 75% and a maximum of 98% saturation with a mean oxygen saturation of 92%.                              ?  ?O2 Saturation (minutes) <89%:   36 minutes ? ?O2 saturation in minutes below 90%; 39.4 minutes      ?  ?Pulse Rate Statistics: ?Pulse Range:    Between 45 and 95 bpm with a mean heart rate of 67 bpm           ?  ?IMPRESSION:  This HST confirms the presence of severe sleep apnea and hypoxia. Snoring was not noted and there was no central apnea noted.  ?  ?RECOMMENDATION: This degree of apnea and hypoxia in a patient with BMI over 35 requires positive airway pressure therapy. ?The patient has done well on CPAP and is invited to use  the mask of her comfort, Auto CPAP will be set to a pressure window between 6 and 16 cm water, with 3 cm  EPR and heated humidification . ResMed device is requested.  ?  ? ?INTERPRETING PHYSICIAN: ? ? Melvyn Novas, MD  ? ?Medical Director of [facility].   ? ? ? ? ? ? ? ? ? ? ? ? ? ? ? ? ? ? ? ?

## 2021-08-27 ENCOUNTER — Other Ambulatory Visit: Payer: Self-pay | Admitting: *Deleted

## 2021-08-27 DIAGNOSIS — G2581 Restless legs syndrome: Secondary | ICD-10-CM

## 2021-08-27 MED ORDER — PRAMIPEXOLE DIHYDROCHLORIDE ER 0.75 MG PO TB24: 1.0000 | ORAL_TABLET | Freq: Every day | ORAL | 1 refills | Status: DC

## 2021-08-29 ENCOUNTER — Other Ambulatory Visit: Payer: Self-pay | Admitting: Neurology

## 2021-08-29 DIAGNOSIS — G2581 Restless legs syndrome: Secondary | ICD-10-CM

## 2021-08-29 DIAGNOSIS — R55 Syncope and collapse: Secondary | ICD-10-CM

## 2021-08-29 DIAGNOSIS — B999 Unspecified infectious disease: Secondary | ICD-10-CM

## 2021-08-29 DIAGNOSIS — R600 Localized edema: Secondary | ICD-10-CM

## 2021-08-29 DIAGNOSIS — G4733 Obstructive sleep apnea (adult) (pediatric): Secondary | ICD-10-CM

## 2021-08-29 NOTE — Procedures (Signed)
? ?  ?  ?[facility] ?  ?HOME SLEEP TEST REPORT ( by Watch PAT)   ?STUDY DATA from 08-26-2021:   ? ?  ?ORDERING CLINICIAN: Benjamin Merrihew, MD  ?REFERRING CLINICIAN: Megan Millikan, NP  ?  ?CLINICAL INFORMATION/HISTORY: 06-24-2021 ?70 year old woman with OSA  on CPAP of 12 cm water, presenting post ED visit for altered mental status. History of RLS and on seizure medication. History of encephalomalacia, patient was seen in ED for Covid and for UTI . ?  ?Epworth sleepiness score: 11/24. ?  ?BMI:  47 kg/m? ?  ?Neck Circumference: na ?  ?FINDINGS: ?  ?Sleep Summary: ?  ?Total Recording Time (hours, min): 9 hours and 23 minutes     ?  ?Total Sleep Time (hours, min):   8 hours 45 minutes            ?  ?Percent REM (%):   23.6%                                   ?  ?Respiratory Indices: ?  ?Calculated pAHI (per hour):    43.8/h                       ?  ?REM pAHI:     49.8/h                                          ?  ?NREM pAHI:    42/h                        ?  ?Positional AHI:   All recorded sleep was in supine position. ? ?Snoring level was very mild with a mean volume of 40 dB which is at the threshold.  Only 9% of sleep were accompanied by snoring.                                              ?  ?Oxygen Saturation Statistics: ? O2 Saturation Range (%):      Between a nadir of 75% and a maximum of 98% saturation with a mean oxygen saturation of 92%.                              ?  ?O2 Saturation (minutes) <89%:   36 minutes ? ?O2 saturation in minutes below 90%; 39.4 minutes      ?  ?Pulse Rate Statistics: ?Pulse Range:    Between 45 and 95 bpm with a mean heart rate of 67 bpm           ?  ?IMPRESSION:  This HST confirms the presence of severe sleep apnea and hypoxia. Snoring was not noted and there was no central apnea noted.  ?  ?RECOMMENDATION: This degree of apnea and hypoxia in a patient with BMI over 35 requires positive airway pressure therapy. ?The patient has done well on CPAP and is invited to use  the mask of her comfort, Auto CPAP will be set to a pressure window between 6 and 16 cm water, with 3 cm   EPR and heated humidification . ResMed device is requested.  ?  ? ?INTERPRETING PHYSICIAN: ? ? Keyira Mondesir, MD  ? ?Medical Director of [facility].   ? ? ? ? ? ? ? ? ? ? ? ? ? ? ? ? ? ? ? ?

## 2021-09-03 ENCOUNTER — Other Ambulatory Visit: Payer: Self-pay | Admitting: Neurology

## 2021-09-03 DIAGNOSIS — G9341 Metabolic encephalopathy: Secondary | ICD-10-CM

## 2021-09-03 DIAGNOSIS — R55 Syncope and collapse: Secondary | ICD-10-CM

## 2021-09-03 DIAGNOSIS — G2581 Restless legs syndrome: Secondary | ICD-10-CM

## 2021-09-03 DIAGNOSIS — G4733 Obstructive sleep apnea (adult) (pediatric): Secondary | ICD-10-CM

## 2021-09-03 DIAGNOSIS — R600 Localized edema: Secondary | ICD-10-CM

## 2021-09-04 ENCOUNTER — Telehealth: Payer: Self-pay | Admitting: *Deleted

## 2021-09-04 ENCOUNTER — Encounter: Payer: Self-pay | Admitting: *Deleted

## 2021-09-04 NOTE — Telephone Encounter (Signed)
-----   Message from Melvyn Novas, MD sent at 09/03/2021  4:38 PM EDT ----- ?IMPRESSION:  This HST confirms the presence of severe sleep apnea and hypoxia. Snoring was not noted and there was no central apnea noted.  ?? ?RECOMMENDATION: This degree of apnea and hypoxia in a patient with BMI over 35 requires positive airway pressure therapy. ?The patient has done well on CPAP and is invited to use the mask of her comfort, Auto CPAP will be set to a pressure window between 6 and 16 cm water, with 3 cm EPR and heated humidification . ResMed device is requested.  ?  ?? ?INTERPRETING PHYSICIAN: ?? ? Melvyn Novas, MD  ?? ?Wellsite geologist of [facility].   ?? ?

## 2021-09-04 NOTE — Telephone Encounter (Signed)
LVM for pt to call about results. °

## 2021-09-04 NOTE — Telephone Encounter (Signed)
Took call from phone staff. I advised pt that Dr. Vickey Huger reviewed their sleep study results and found that pt has severe sleep apnea. Dr. Vickey Huger recommends that pt continue on CPAP. I reviewed PAP compliance expectations with the pt. Pt is agreeable to starting a CPAP. I advised pt that an order will be sent to a DME, [facility], and [facility] will call the pt within about one week after they file with the pt's insurance. [facility] will show the pt how to use the machine, fit for masks, and troubleshoot the CPAP if needed. A follow up appt was made for insurance purposes with Dr. Vickey Huger on 11/25/2021 at 10:30a. Pt verbalized understanding to arrive 15 minutes early and bring their CPAP. A letter with all of this information in it will be mailed to the pt as a reminder. I verified with the pt that the address we have on file is correct. Pt verbalized understanding of results. Pt had no questions at this time but was encouraged to call back if questions arise. I have sent the order to [facility] and have received confirmation that they have received the order. ? ?

## 2021-09-10 DIAGNOSIS — R309 Painful micturition, unspecified: Secondary | ICD-10-CM | POA: Diagnosis not present

## 2021-09-10 DIAGNOSIS — I83019 Varicose veins of right lower extremity with ulcer of unspecified site: Secondary | ICD-10-CM | POA: Diagnosis not present

## 2021-09-10 DIAGNOSIS — L97919 Non-pressure chronic ulcer of unspecified part of right lower leg with unspecified severity: Secondary | ICD-10-CM | POA: Diagnosis not present

## 2021-09-10 DIAGNOSIS — I872 Venous insufficiency (chronic) (peripheral): Secondary | ICD-10-CM | POA: Diagnosis not present

## 2021-09-10 DIAGNOSIS — I890 Lymphedema, not elsewhere classified: Secondary | ICD-10-CM | POA: Diagnosis not present

## 2021-09-11 ENCOUNTER — Telehealth: Payer: Self-pay | Admitting: Neurology

## 2021-09-16 DIAGNOSIS — I890 Lymphedema, not elsewhere classified: Secondary | ICD-10-CM | POA: Diagnosis not present

## 2021-09-19 DIAGNOSIS — M1611 Unilateral primary osteoarthritis, right hip: Secondary | ICD-10-CM | POA: Diagnosis not present

## 2021-09-19 DIAGNOSIS — M47816 Spondylosis without myelopathy or radiculopathy, lumbar region: Secondary | ICD-10-CM | POA: Diagnosis not present

## 2021-09-23 DIAGNOSIS — G40909 Epilepsy, unspecified, not intractable, without status epilepticus: Secondary | ICD-10-CM | POA: Diagnosis not present

## 2021-09-23 DIAGNOSIS — M170 Bilateral primary osteoarthritis of knee: Secondary | ICD-10-CM | POA: Diagnosis not present

## 2021-09-23 DIAGNOSIS — F0280 Dementia in other diseases classified elsewhere without behavioral disturbance: Secondary | ICD-10-CM | POA: Diagnosis not present

## 2021-09-23 DIAGNOSIS — S81801D Unspecified open wound, right lower leg, subsequent encounter: Secondary | ICD-10-CM | POA: Diagnosis not present

## 2021-09-23 DIAGNOSIS — M19079 Primary osteoarthritis, unspecified ankle and foot: Secondary | ICD-10-CM | POA: Diagnosis not present

## 2021-09-23 DIAGNOSIS — G9389 Other specified disorders of brain: Secondary | ICD-10-CM | POA: Diagnosis not present

## 2021-09-23 DIAGNOSIS — I890 Lymphedema, not elsewhere classified: Secondary | ICD-10-CM | POA: Diagnosis not present

## 2021-09-23 DIAGNOSIS — I110 Hypertensive heart disease with heart failure: Secondary | ICD-10-CM | POA: Diagnosis not present

## 2021-09-23 DIAGNOSIS — I5032 Chronic diastolic (congestive) heart failure: Secondary | ICD-10-CM | POA: Diagnosis not present

## 2021-09-24 DIAGNOSIS — N3000 Acute cystitis without hematuria: Secondary | ICD-10-CM | POA: Diagnosis not present

## 2021-09-25 DIAGNOSIS — I890 Lymphedema, not elsewhere classified: Secondary | ICD-10-CM | POA: Diagnosis not present

## 2021-09-30 ENCOUNTER — Ambulatory Visit: Payer: Medicare PPO | Admitting: Cardiology

## 2021-09-30 VITALS — BP 172/74 | HR 59 | Ht 62.0 in | Wt 280.8 lb

## 2021-09-30 DIAGNOSIS — G4733 Obstructive sleep apnea (adult) (pediatric): Secondary | ICD-10-CM

## 2021-09-30 DIAGNOSIS — I890 Lymphedema, not elsewhere classified: Secondary | ICD-10-CM

## 2021-09-30 DIAGNOSIS — Z9989 Dependence on other enabling machines and devices: Secondary | ICD-10-CM | POA: Diagnosis not present

## 2021-09-30 DIAGNOSIS — G2581 Restless legs syndrome: Secondary | ICD-10-CM | POA: Diagnosis not present

## 2021-09-30 DIAGNOSIS — R600 Localized edema: Secondary | ICD-10-CM

## 2021-09-30 DIAGNOSIS — I10 Essential (primary) hypertension: Secondary | ICD-10-CM
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
## 2021-09-30 NOTE — Patient Instructions (Addendum)
Medication Instructions:  No changes   *If you need a refill on your cardiac medications before your next appointment, please call your pharmacy*   Lab Work: Not needed    Testing/Procedures: Not needed   Follow-Up: At [facility], you and your health needs are our priority.  As part of our continuing mission to provide you with exceptional heart care, we have created designated Provider Care Teams.  These Care Teams include your primary Cardiologist (physician) and Advanced Practice Providers (APPs -  Physician Assistants and Nurse Practitioners) who all work together to provide you with the care you need, when you need it.     Your next appointment:   6 month(s)  The format for your next appointment:   In Person  Provider:   None    Other Instructions

## 2021-10-01 DIAGNOSIS — L97919 Non-pressure chronic ulcer of unspecified part of right lower leg with unspecified severity: Secondary | ICD-10-CM | POA: Diagnosis not present

## 2021-10-01 DIAGNOSIS — I872 Venous insufficiency (chronic) (peripheral): Secondary | ICD-10-CM | POA: Diagnosis not present

## 2021-10-01 DIAGNOSIS — I890 Lymphedema, not elsewhere classified: Secondary | ICD-10-CM | POA: Diagnosis not present

## 2021-10-01 DIAGNOSIS — I83019 Varicose veins of right lower extremity with ulcer of unspecified site: Secondary | ICD-10-CM | POA: Diagnosis not present

## 2021-10-02 DIAGNOSIS — I890 Lymphedema, not elsewhere classified: Secondary | ICD-10-CM | POA: Diagnosis not present

## 2021-10-08 DIAGNOSIS — K591 Functional diarrhea: Secondary | ICD-10-CM | POA: Diagnosis not present

## 2021-10-08 DIAGNOSIS — D125 Benign neoplasm of sigmoid colon: Secondary | ICD-10-CM | POA: Diagnosis not present

## 2021-10-08 DIAGNOSIS — K5730 Diverticulosis of large intestine without perforation or abscess without bleeding: Secondary | ICD-10-CM | POA: Diagnosis not present

## 2021-10-08 DIAGNOSIS — D122 Benign neoplasm of ascending colon: Secondary | ICD-10-CM | POA: Diagnosis not present

## 2021-10-08 DIAGNOSIS — K649 Unspecified hemorrhoids: Secondary | ICD-10-CM | POA: Diagnosis not present

## 2021-10-09 DIAGNOSIS — S81801A Unspecified open wound, right lower leg, initial encounter: Secondary | ICD-10-CM | POA: Diagnosis not present

## 2021-10-09 DIAGNOSIS — I890 Lymphedema, not elsewhere classified: Secondary | ICD-10-CM | POA: Diagnosis not present

## 2021-10-09 DIAGNOSIS — R29898 Other symptoms and signs involving the musculoskeletal system: Secondary | ICD-10-CM | POA: Diagnosis not present

## 2021-10-15 DIAGNOSIS — D125 Benign neoplasm of sigmoid colon: Secondary | ICD-10-CM | POA: Diagnosis not present

## 2021-10-15 DIAGNOSIS — D122 Benign neoplasm of ascending colon: Secondary | ICD-10-CM | POA: Diagnosis not present

## 2021-10-16 DIAGNOSIS — S81801A Unspecified open wound, right lower leg, initial encounter: Secondary | ICD-10-CM | POA: Diagnosis not present

## 2021-10-16 DIAGNOSIS — I890 Lymphedema, not elsewhere classified: Secondary | ICD-10-CM | POA: Diagnosis not present

## 2021-10-16 DIAGNOSIS — R29898 Other symptoms and signs involving the musculoskeletal system: Secondary | ICD-10-CM | POA: Diagnosis not present

## 2021-10-22 DIAGNOSIS — I83019 Varicose veins of right lower extremity with ulcer of unspecified site: Secondary | ICD-10-CM | POA: Diagnosis not present

## 2021-10-22 DIAGNOSIS — I872 Venous insufficiency (chronic) (peripheral): Secondary | ICD-10-CM | POA: Diagnosis not present

## 2021-10-22 DIAGNOSIS — S81801A Unspecified open wound, right lower leg, initial encounter: Secondary | ICD-10-CM | POA: Diagnosis not present

## 2021-10-22 DIAGNOSIS — R29898 Other symptoms and signs involving the musculoskeletal system: Secondary | ICD-10-CM | POA: Diagnosis not present

## 2021-10-22 DIAGNOSIS — I890 Lymphedema, not elsewhere classified: Secondary | ICD-10-CM | POA: Diagnosis not present

## 2021-10-22 DIAGNOSIS — L97919 Non-pressure chronic ulcer of unspecified part of right lower leg with unspecified severity: Secondary | ICD-10-CM | POA: Diagnosis not present

## 2021-10-23 ENCOUNTER — Other Ambulatory Visit: Payer: Self-pay | Admitting: Cardiology

## 2021-10-29 ENCOUNTER — Ambulatory Visit: Payer: Medicare PPO | Admitting: Adult Health

## 2021-10-29 DIAGNOSIS — I890 Lymphedema, not elsewhere classified: Secondary | ICD-10-CM | POA: Diagnosis not present

## 2021-10-29 DIAGNOSIS — R29898 Other symptoms and signs involving the musculoskeletal system: Secondary | ICD-10-CM | POA: Diagnosis not present

## 2021-10-29 DIAGNOSIS — S81801A Unspecified open wound, right lower leg, initial encounter: Secondary | ICD-10-CM | POA: Diagnosis not present

## 2021-11-05 ENCOUNTER — Telehealth: Payer: Self-pay

## 2021-11-05 NOTE — Telephone Encounter (Signed)
Received request from [facility] for letter to approve driving.  Researched chart, I could not locate any driving restrictions on this patient.   Will formulate letter for MD to review and sign if appropriate.

## 2021-11-09 ENCOUNTER — Encounter: Payer: Self-pay | Admitting: Cardiology

## 2021-11-09 NOTE — Assessment & Plan Note (Signed)
Probably related to her venous stasis/venous hypertension and lymphedema exacerbating RLS.  With more effective treatment of her lower extremity edema, this will improve.

## 2021-11-09 NOTE — Assessment & Plan Note (Signed)
Has been without medications for a while but now very hypertensive.  She is on amlodipine, unfortunately she did not take it today.  Would probably want to consider a different option for managing her blood pressure other than a medication that it has known side effect of edema.  I have asked that she monitor her pressures and we can consider the possibility of ARB she is already on spironolactone, but potassium is not overly high.  If we did this, she probably would not necessarily need potassium supplementation for her torsemide dosing.[name]

## 2021-11-09 NOTE — Assessment & Plan Note (Signed)
Managed by lymphedema clinic.  Once wound heals, she needs to be back on Unaboot as well as lymphedema pumps.  Continue to encourage foot elevation is much as possible.

## 2021-11-09 NOTE — Assessment & Plan Note (Signed)
Oftentimes refuses to use her CPAP.  I recommended that she do the best she can to wear it for as long as she can at night.

## 2021-11-09 NOTE — Assessment & Plan Note (Signed)
Labs followed by PCP.  Last LDL was excellent at 59.  Triglycerides are high though. She remains on stable dose of rosuvastatin.  Would potentially consider Vascepa or at a minimum fenofibrate for hypertriglyceridemia.

## 2021-11-09 NOTE — Assessment & Plan Note (Signed)
Difficult proposition.  Clear legal contributing to her venous stasis and lymphedema, and needs weight loss.  The recommendation for vascular surgery actually was to consider bariatric care.  Would potentially consider Wegovy as an option.  Would like to see her venous stasis wound healed before considering options for surgical bariatric care.

## 2021-11-10 NOTE — Assessment & Plan Note (Signed)
Chronic.  Significant swelling but not cardiac in nature.  Treatment options are limited from a vascular standpoint.  Weight loss will be very helpful.  She is currently on torsemide and diuresing relatively well.  She had been on metolazone which was helping, but no clear indication for aggressive diuretics for noncardiac edema.  Defer to primary team and wound care.

## 2021-11-12 DIAGNOSIS — I872 Venous insufficiency (chronic) (peripheral): Secondary | ICD-10-CM | POA: Diagnosis not present

## 2021-11-12 DIAGNOSIS — L97919 Non-pressure chronic ulcer of unspecified part of right lower leg with unspecified severity: Secondary | ICD-10-CM | POA: Diagnosis not present

## 2021-11-12 DIAGNOSIS — I890 Lymphedema, not elsewhere classified: Secondary | ICD-10-CM | POA: Diagnosis not present

## 2021-11-12 DIAGNOSIS — I83019 Varicose veins of right lower extremity with ulcer of unspecified site: Secondary | ICD-10-CM | POA: Diagnosis not present

## 2021-11-19 DIAGNOSIS — R29898 Other symptoms and signs involving the musculoskeletal system: Secondary | ICD-10-CM | POA: Diagnosis not present

## 2021-11-19 DIAGNOSIS — S81801A Unspecified open wound, right lower leg, initial encounter: Secondary | ICD-10-CM | POA: Diagnosis not present

## 2021-11-19 DIAGNOSIS — I890 Lymphedema, not elsewhere classified: Secondary | ICD-10-CM | POA: Diagnosis not present

## 2021-11-25 ENCOUNTER — Encounter: Payer: Self-pay | Admitting: Neurology

## 2021-11-25 ENCOUNTER — Ambulatory Visit: Payer: Medicare PPO | Admitting: Neurology

## 2021-11-25 VITALS — BP 173/75 | HR 72 | Ht 62.0 in | Wt 280.0 lb

## 2021-11-25 DIAGNOSIS — Z9989 Dependence on other enabling machines and devices: Secondary | ICD-10-CM | POA: Diagnosis not present

## 2021-11-25 DIAGNOSIS — I720 Aneurysm of carotid artery: Secondary | ICD-10-CM

## 2021-11-25 DIAGNOSIS — G2581 Restless legs syndrome: Secondary | ICD-10-CM | POA: Diagnosis not present

## 2021-11-25 DIAGNOSIS — G4733 Obstructive sleep apnea (adult) (pediatric): Secondary | ICD-10-CM

## 2021-11-25 MED ORDER — PHENOBARBITAL 32.4 MG PO TABS: 32.4000 mg | ORAL_TABLET | Freq: Every day | ORAL | 3 refills | Status: DC

## 2021-11-25 NOTE — Patient Instructions (Signed)
KEEPING IT CLEAN: CPAP HYGIENE PROPER UPKEEP OF YOUR CPAP MACHINE CAN HELP ENSURE THE DEVICE FUNCTIONS PROPERLY CPAP CLEANING INSTRUCTIONS Along with proper CPAP cleaning it is recommended that you replace your mask, tubing and filters once very 3 months and more frequently if you are sick.   DAILY CLEANING Do not use moisturizing soaps, bleach, scented oils, chlorine, or alcohol-based solutions to clean your supplies. These solutions may cause irritation to your skin and lungs and may reduce the life of your products. Dawn Dish soap or Comparable works best for daily cleaning.  **If you've been sick, it's smart to wash your mask, tubing, humidifier and filter daily until your cold, flu or virus symptoms are gone. That can help reduce the amount of time you spend under the weather.  Before using your mask -wash your face daily with soap and water to remove excess facial oils. Wipe down your mask (including areas that come in contact with your skin) using a damp towel with soap and warm water. This will remove any oils, dead skin cells, and sweat on the mask that can affect the quality of the seal. Gently rinse with a clean towel and let the mask air-dry out of direct sunlight. You can also use unscented baby wipes or pre-moistened towels designed specifically for cleaning CPAP masks, which are available on-line. DO NOT USE CLOROX OR DISINFECTING WIPES. If your unit has a humidifier, empty any leftover water instead of letting in sit in the unit all day. Refill the humidifier with clean, distilled water right before bedtime for optimal use  WEEKLY (OR MORE FREQUENT) CLEANING Your mask and tubing need a full bath at least once a week to keep it free of dust, bacteria, and germs. (During COVID-19 or any other flu/virus we recommend more frequent cleaning) Clean the CPAP tubing, nasal mask, and headgear in a bathroom sink filled with warm water and a few drops of ammonia-free, mild dish detergent. Avoid  using stronger cleaning products, as they may damage the mask or leave harmful residue. Swirl all parts around for about five minutes, rinse well and let air dry during the day. Hang the tubing over the shower rod, on a towel rack or in the laundry room to ensure all the water drips out. The mask and headgear can be air-dried on a towel or hung on a hook or hanger. You should also wipe down your CPAP machine with a damp cloth. Ensure the unit is unplugged. The towel shouldn't be too damp or wet, as water could get into the machine. Clean the filter by removing it and rinsing it in warm tap water. Run it under the water and squeeze to make sure there is no dust. Then blot down the filter with a towel. Do not wash your machine's white filter, if one is present--those are disposable and should be replaced every two weeks. If you are recovering from being sick, we recommend changing the filter sooner. If your CPAP has a humidifier, that also needs to be cleaned weekly. Empty any remaining water and then wash the water chamber in the sink with warm soapy water. Rinse well and drain out as much of the water as possible. Let the chamber air-dry before placing it back into the CPAP unit. Every other week you should disinfect the humidifier. Do that by soaking it in a solution of one-part vinegar to five parts water for 30 minutes, thoroughly rinsing and then placing in your dishwasher's top rack for washing. And   keep it clean by using only distilled water to prevent mineral deposits that can build up and cause damage to your machine.  IMPORTANT TIPS Make caring for your CPAP equipment part of your morning routine. Keep machine and accessories out of direct sunlight to avoid damaging them. Never use bleach to clean accessories. Place machine on a level surface and away from curtains that may interfere with the air intake. Keep track of when you should order replacement parts for your mask and accessories so that  you always get the most out of your CPAP. You can also sign up for [employer] by contacting our DME department at [email or web address]  The following are examples of soap that may be used: [employer] baby soap, Ivory soap (plain).  With a little upkeep, your CPAP can continue to help you breathe better for a long time. Just a few minutes a day can help keep your CPAP running efficiently for years to come.  If you have a CPAP, but are struggling with compliance, check out our no mask oral appliance, for those with mild to moderate sleep apnea.  Call and schedule a consultation with one of our sleep medicine physicians, or ask your doctor about a sleep referral to the [facility].  
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
## 2021-11-27 DIAGNOSIS — I890 Lymphedema, not elsewhere classified: Secondary | ICD-10-CM | POA: Diagnosis not present

## 2021-12-01 ENCOUNTER — Telehealth: Payer: Self-pay | Admitting: Neurology

## 2021-12-01 NOTE — Telephone Encounter (Signed)
Received a notification from [employer]. They stated that they spoke with the patient about the recording time.   "Spoke with pt. Informed her that the cpap may not be on an running, which may be the reason her data is no recording. Advised that she check the start/stop button on top of the cpap to make sure it's white and not green, as that's how she'll be able to tell that the air is on and blowing. Pt verbalized that she understood and said she'll check for that going forward. Told her to reach back out if she has any other issues."

## 2021-12-03 DIAGNOSIS — L97919 Non-pressure chronic ulcer of unspecified part of right lower leg with unspecified severity: Secondary | ICD-10-CM | POA: Diagnosis not present

## 2021-12-03 DIAGNOSIS — I872 Venous insufficiency (chronic) (peripheral): Secondary | ICD-10-CM | POA: Diagnosis not present

## 2021-12-03 DIAGNOSIS — I890 Lymphedema, not elsewhere classified: Secondary | ICD-10-CM | POA: Diagnosis not present

## 2021-12-03 DIAGNOSIS — I83019 Varicose veins of right lower extremity with ulcer of unspecified site: Secondary | ICD-10-CM | POA: Diagnosis not present

## 2021-12-04 DIAGNOSIS — I890 Lymphedema, not elsewhere classified: Secondary | ICD-10-CM | POA: Diagnosis not present

## 2021-12-08 DIAGNOSIS — I890 Lymphedema, not elsewhere classified: Secondary | ICD-10-CM | POA: Diagnosis not present

## 2021-12-15 ENCOUNTER — Other Ambulatory Visit: Payer: Self-pay | Admitting: Neurology

## 2021-12-24 DIAGNOSIS — I890 Lymphedema, not elsewhere classified: Secondary | ICD-10-CM | POA: Diagnosis not present

## 2021-12-24 DIAGNOSIS — L97919 Non-pressure chronic ulcer of unspecified part of right lower leg with unspecified severity: Secondary | ICD-10-CM | POA: Diagnosis not present

## 2021-12-24 DIAGNOSIS — I83019 Varicose veins of right lower extremity with ulcer of unspecified site: Secondary | ICD-10-CM | POA: Diagnosis not present

## 2021-12-24 DIAGNOSIS — I872 Venous insufficiency (chronic) (peripheral): Secondary | ICD-10-CM | POA: Diagnosis not present

## 2021-12-25 DIAGNOSIS — Z6841 Body Mass Index (BMI) 40.0 and over, adult: Secondary | ICD-10-CM | POA: Diagnosis not present

## 2021-12-25 DIAGNOSIS — Z87440 Personal history of urinary (tract) infections: Secondary | ICD-10-CM | POA: Diagnosis not present

## 2021-12-25 DIAGNOSIS — Z888 Allergy status to other drugs, medicaments and biological substances status: Secondary | ICD-10-CM | POA: Diagnosis not present

## 2021-12-25 DIAGNOSIS — Z87892 Personal history of anaphylaxis: Secondary | ICD-10-CM | POA: Diagnosis not present

## 2021-12-25 DIAGNOSIS — Z91041 Radiographic dye allergy status: Secondary | ICD-10-CM | POA: Diagnosis not present

## 2021-12-25 DIAGNOSIS — E261 Secondary hyperaldosteronism: Secondary | ICD-10-CM | POA: Diagnosis not present

## 2021-12-25 DIAGNOSIS — I509 Heart failure, unspecified: Secondary | ICD-10-CM | POA: Diagnosis not present

## 2021-12-25 DIAGNOSIS — E785 Hyperlipidemia, unspecified: Secondary | ICD-10-CM | POA: Diagnosis not present

## 2021-12-25 DIAGNOSIS — Z87891 Personal history of nicotine dependence: Secondary | ICD-10-CM | POA: Diagnosis not present

## 2021-12-25 DIAGNOSIS — I110 Hypertensive heart disease with heart failure: Secondary | ICD-10-CM | POA: Diagnosis not present

## 2021-12-25 DIAGNOSIS — Z85828 Personal history of other malignant neoplasm of skin: Secondary | ICD-10-CM | POA: Diagnosis not present

## 2021-12-25 DIAGNOSIS — G40909 Epilepsy, unspecified, not intractable, without status epilepticus: Secondary | ICD-10-CM | POA: Diagnosis not present

## 2021-12-25 DIAGNOSIS — L97902 Non-pressure chronic ulcer of unspecified part of unspecified lower leg with fat layer exposed: Secondary | ICD-10-CM | POA: Diagnosis not present

## 2021-12-25 DIAGNOSIS — Z91038 Other insect allergy status: Secondary | ICD-10-CM | POA: Diagnosis not present

## 2021-12-25 DIAGNOSIS — Z885 Allergy status to narcotic agent status: Secondary | ICD-10-CM | POA: Diagnosis not present

## 2021-12-25 DIAGNOSIS — L97909 Non-pressure chronic ulcer of unspecified part of unspecified lower leg with unspecified severity: Secondary | ICD-10-CM | POA: Diagnosis not present

## 2021-12-26 DIAGNOSIS — I890 Lymphedema, not elsewhere classified: Secondary | ICD-10-CM | POA: Diagnosis not present

## 2021-12-29 DIAGNOSIS — I890 Lymphedema, not elsewhere classified: Secondary | ICD-10-CM | POA: Diagnosis not present

## 2022-01-02 DIAGNOSIS — I890 Lymphedema, not elsewhere classified: Secondary | ICD-10-CM | POA: Diagnosis not present

## 2022-01-05 DIAGNOSIS — I890 Lymphedema, not elsewhere classified: Secondary | ICD-10-CM | POA: Diagnosis not present

## 2022-01-07 DIAGNOSIS — I890 Lymphedema, not elsewhere classified: Secondary | ICD-10-CM | POA: Diagnosis not present

## 2022-01-14 DIAGNOSIS — I872 Venous insufficiency (chronic) (peripheral): Secondary | ICD-10-CM | POA: Diagnosis not present

## 2022-01-14 DIAGNOSIS — I890 Lymphedema, not elsewhere classified: Secondary | ICD-10-CM | POA: Diagnosis not present

## 2022-01-14 DIAGNOSIS — L97919 Non-pressure chronic ulcer of unspecified part of right lower leg with unspecified severity: Secondary | ICD-10-CM | POA: Diagnosis not present

## 2022-01-14 DIAGNOSIS — I83019 Varicose veins of right lower extremity with ulcer of unspecified site: Secondary | ICD-10-CM | POA: Diagnosis not present

## 2022-01-17 DIAGNOSIS — M25551 Pain in right hip: Secondary | ICD-10-CM | POA: Diagnosis not present

## 2022-01-19 ENCOUNTER — Other Ambulatory Visit: Payer: Self-pay | Admitting: Physical Medicine and Rehabilitation

## 2022-01-19 DIAGNOSIS — M25551 Pain in right hip: Secondary | ICD-10-CM

## 2022-01-21 DIAGNOSIS — I872 Venous insufficiency (chronic) (peripheral): Secondary | ICD-10-CM | POA: Diagnosis not present

## 2022-01-21 DIAGNOSIS — I83218 Varicose veins of right lower extremity with both ulcer of other part of lower extremity and inflammation: Secondary | ICD-10-CM | POA: Diagnosis not present

## 2022-01-21 DIAGNOSIS — L97919 Non-pressure chronic ulcer of unspecified part of right lower leg with unspecified severity: Secondary | ICD-10-CM | POA: Diagnosis not present

## 2022-01-21 DIAGNOSIS — I890 Lymphedema, not elsewhere classified: Secondary | ICD-10-CM | POA: Diagnosis not present

## 2022-01-21 DIAGNOSIS — L97819 Non-pressure chronic ulcer of other part of right lower leg with unspecified severity: Secondary | ICD-10-CM | POA: Diagnosis not present

## 2022-01-21 DIAGNOSIS — I83019 Varicose veins of right lower extremity with ulcer of unspecified site: Secondary | ICD-10-CM | POA: Diagnosis not present

## 2022-01-23 ENCOUNTER — Ambulatory Visit
Admission: RE | Admit: 2022-01-23 | Discharge: 2022-01-23 | Disposition: A | Discharge status: Home / Self Care | Payer: Medicare PPO | Source: Ambulatory Visit | Attending: Physical Medicine and Rehabilitation | Admitting: Physical Medicine and Rehabilitation

## 2022-01-23 DIAGNOSIS — M25551 Pain in right hip: Secondary | ICD-10-CM | POA: Diagnosis not present

## 2022-01-23 MED ORDER — IOPAMIDOL (ISOVUE-M 200) INJECTION 41%
1.0000 mL | Freq: Once | INTRAMUSCULAR | Status: AC
  Administered 2022-01-23: 1 mL via INTRA_ARTICULAR

## 2022-01-23 MED ORDER — METHYLPREDNISOLONE ACETATE 40 MG/ML INJ SUSP (RADIOLOG
80.0000 mg | Freq: Once | INTRAMUSCULAR | Status: AC
  Administered 2022-01-23: 80 mg via INTRA_ARTICULAR

## 2022-01-29 DIAGNOSIS — I890 Lymphedema, not elsewhere classified: Secondary | ICD-10-CM | POA: Diagnosis not present

## 2022-02-03 DIAGNOSIS — I890 Lymphedema, not elsewhere classified: Secondary | ICD-10-CM | POA: Diagnosis not present

## 2022-02-04 ENCOUNTER — Other Ambulatory Visit: Payer: Self-pay | Admitting: Neurology

## 2022-02-04 DIAGNOSIS — G2581 Restless legs syndrome: Secondary | ICD-10-CM

## 2022-02-05 DIAGNOSIS — I890 Lymphedema, not elsewhere classified: Secondary | ICD-10-CM | POA: Diagnosis not present

## 2022-02-17 DIAGNOSIS — I890 Lymphedema, not elsewhere classified: Secondary | ICD-10-CM | POA: Diagnosis not present

## 2022-02-19 DIAGNOSIS — I890 Lymphedema, not elsewhere classified: Secondary | ICD-10-CM | POA: Diagnosis not present

## 2022-02-23 DIAGNOSIS — I890 Lymphedema, not elsewhere classified: Secondary | ICD-10-CM | POA: Diagnosis not present

## 2022-03-02 ENCOUNTER — Other Ambulatory Visit: Payer: Self-pay | Admitting: Neurology

## 2022-03-02 DIAGNOSIS — G2581 Restless legs syndrome: Secondary | ICD-10-CM

## 2022-03-06 DIAGNOSIS — I890 Lymphedema, not elsewhere classified: Secondary | ICD-10-CM | POA: Diagnosis not present

## 2022-03-10 ENCOUNTER — Other Ambulatory Visit: Payer: Self-pay | Admitting: Physical Medicine and Rehabilitation

## 2022-03-10 DIAGNOSIS — M25551 Pain in right hip: Secondary | ICD-10-CM

## 2022-03-18 ENCOUNTER — Inpatient Hospital Stay: Admission: RE | Admit: 2022-03-18 | Payer: Medicare PPO | Source: Ambulatory Visit

## 2022-03-18 DIAGNOSIS — I890 Lymphedema, not elsewhere classified: Secondary | ICD-10-CM | POA: Diagnosis not present

## 2022-03-20 DIAGNOSIS — I890 Lymphedema, not elsewhere classified: Secondary | ICD-10-CM | POA: Diagnosis not present

## 2022-03-23 ENCOUNTER — Ambulatory Visit
Admission: RE | Admit: 2022-03-23 | Discharge: 2022-03-23 | Disposition: A | Discharge status: Home / Self Care | Payer: Medicare PPO | Source: Ambulatory Visit | Attending: Physical Medicine and Rehabilitation | Admitting: Physical Medicine and Rehabilitation

## 2022-03-23 DIAGNOSIS — M25551 Pain in right hip: Secondary | ICD-10-CM

## 2022-03-23 DIAGNOSIS — M1611 Unilateral primary osteoarthritis, right hip: Secondary | ICD-10-CM | POA: Diagnosis not present

## 2022-03-23 MED ORDER — IOPAMIDOL (ISOVUE-M 200) INJECTION 41%
1.0000 mL | Freq: Once | INTRAMUSCULAR | Status: AC
  Administered 2022-03-23: 1 mL via INTRA_ARTICULAR

## 2022-03-23 MED ORDER — METHYLPREDNISOLONE ACETATE 40 MG/ML INJ SUSP (RADIOLOG
80.0000 mg | Freq: Once | INTRAMUSCULAR | Status: AC
  Administered 2022-03-23: 80 mg via INTRA_ARTICULAR

## 2022-03-24 ENCOUNTER — Ambulatory Visit: Payer: Medicare PPO | Attending: Cardiology | Admitting: Cardiology

## 2022-03-24 ENCOUNTER — Other Ambulatory Visit: Payer: Self-pay

## 2022-03-24 ENCOUNTER — Encounter: Payer: Self-pay | Admitting: Cardiology

## 2022-03-24 VITALS — BP 189/64 | HR 63 | Ht 62.0 in | Wt 283.8 lb

## 2022-03-24 DIAGNOSIS — R55 Syncope and collapse: Secondary | ICD-10-CM | POA: Diagnosis not present

## 2022-03-24 DIAGNOSIS — R600 Localized edema: Secondary | ICD-10-CM | POA: Diagnosis not present

## 2022-03-24 DIAGNOSIS — G4733 Obstructive sleep apnea (adult) (pediatric): Secondary | ICD-10-CM

## 2022-03-24 DIAGNOSIS — I10 Essential (primary) hypertension: Secondary | ICD-10-CM

## 2022-03-24 MED ORDER — AMLODIPINE BESYLATE 5 MG PO TABS: ORAL_TABLET | ORAL | 3 refills | Status: DC

## 2022-03-24 MED ORDER — VALSARTAN 80 MG PO TABS: 80.0000 mg | ORAL_TABLET | Freq: Every day | ORAL | 3 refills | Status: DC

## 2022-03-24 NOTE — Patient Instructions (Addendum)
Medication Instructions:   Start taking Valsartan 80 mg one tablet daily   *If you need a refill on your cardiac medications before your next appointment, please call your pharmacy*   Lab Work: BMP - today And  BMP in 2 weeks   If you have labs (blood work) drawn today and your tests are completely normal, you will receive your results only by: MyChart Message (if you have MyChart) OR A paper copy in the mail If you have any lab test that is abnormal or we need to change your treatment, we will call you to review the results.   Testing/Procedures:  Not needed  Follow-Up: At [facility], you and your health needs are our priority.  As part of our continuing mission to provide you with exceptional heart care, we have created designated Provider Care Teams.  These Care Teams include your primary Cardiologist (physician) and Advanced Practice Providers (APPs -  Physician Assistants and Nurse Practitioners) who all work together to provide you with the care you need, when you need it.     Your next appointment:   3 month(s)  The format for your next appointment:   In Person  Provider:   Bernadene Person, NP    Then, Bryan Lemma, MD will plan to see you again in 6 month(s).
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
## 2022-03-25 ENCOUNTER — Telehealth: Payer: Self-pay | Admitting: Cardiology

## 2022-03-25 DIAGNOSIS — I890 Lymphedema, not elsewhere classified: Secondary | ICD-10-CM | POA: Diagnosis not present

## 2022-03-27 DIAGNOSIS — I890 Lymphedema, not elsewhere classified: Secondary | ICD-10-CM | POA: Diagnosis not present

## 2022-03-28 LAB — COMPREHENSIVE METABOLIC PANEL
ALT: 21 IU/L (ref 0–44)
AST: 24 IU/L (ref 0–40)
Albumin/Globulin Ratio: 1.5 (ref 1.2–2.2)
Albumin: 4.6 g/dL (ref 3.9–4.9)
Alkaline Phosphatase: 105 IU/L (ref 44–121)
BUN/Creatinine Ratio: 18 (ref 10–24)
BUN: 14 mg/dL (ref 8–27)
Bilirubin Total: 0.3 mg/dL (ref 0.0–1.2)
CO2: 19 mmol/L — ABNORMAL LOW (ref 20–29)
Calcium: 10.2 mg/dL (ref 8.6–10.2)
Chloride: 102 mmol/L (ref 96–106)
Creatinine, Ser: 0.78 mg/dL (ref 0.76–1.27)
Globulin, Total: 3.1 g/dL (ref 1.5–4.5)
Glucose: 103 mg/dL — ABNORMAL HIGH (ref 70–99)
Potassium: 4.4 mmol/L (ref 3.5–5.2)
Sodium: 140 mmol/L (ref 134–144)
Total Protein: 7.7 g/dL (ref 6.0–8.5)
eGFR: 96 mL/min/{1.73_m2} (ref >59)

## 2022-03-28 LAB — SPECIMEN STATUS REPORT

## 2022-03-29 ENCOUNTER — Encounter: Payer: Self-pay | Admitting: Cardiology

## 2022-03-29 NOTE — Assessment & Plan Note (Signed)
Blood pressure still high.  At this point I think she needs more afterload reduction generalized blood pressure control. We will check chemistry panel today and start valsartan 80 mg daily.  Check back chemistry and roughly 2 weeks.

## 2022-03-29 NOTE — Assessment & Plan Note (Signed)
Not consistently using.  I recommend that she at least wear it as long as she can every night.

## 2022-03-29 NOTE — Assessment & Plan Note (Signed)
The patient understands the need to lose weight with diet and exercise. We have discussed specific strategies for this.  

## 2022-03-29 NOTE — Assessment & Plan Note (Signed)
Chronic combined venous stasis and lymphedema.  Limited options for treatment.  Weight loss important.  Foot elevation important. Possible Unaboot or other compression stockings plus lymphedema pumps.  Currently on torsemide with occasional metolazone along with spironolactone.  Truly for lymphedema and venous stasis, diuretics are not felt to be all that effective.

## 2022-03-29 NOTE — Assessment & Plan Note (Signed)
Doing well, no recurrent issues.

## 2022-03-30 ENCOUNTER — Telehealth: Payer: Self-pay | Admitting: Cardiology

## 2022-03-30 DIAGNOSIS — I890 Lymphedema, not elsewhere classified: Secondary | ICD-10-CM | POA: Diagnosis not present

## 2022-03-30 MED ORDER — METOLAZONE 5 MG PO TABS: 5.0000 mg | ORAL_TABLET | Freq: Every day | ORAL | 3 refills | Status: DC

## 2022-03-30 NOTE — Telephone Encounter (Signed)
*  STAT* If patient is at the pharmacy, call can be transferred to refill team.   1. Which medications need to be refilled? (please list name of each medication and dose if known) metolazone (ZAROXOLYN) 5 MG tablet   2. Which pharmacy/location (including street and city if local pharmacy) is medication to be sent to?  [id number] - [place], [place] - [address]    3. Do they need a 30 day or 90 day supply? 90

## 2022-04-01 DIAGNOSIS — I890 Lymphedema, not elsewhere classified: Secondary | ICD-10-CM | POA: Diagnosis not present

## 2022-04-01 NOTE — Telephone Encounter (Signed)
Error

## 2022-04-07 DIAGNOSIS — I10 Essential (primary) hypertension: Secondary | ICD-10-CM | POA: Diagnosis not present

## 2022-04-08 LAB — BASIC METABOLIC PANEL
BUN/Creatinine Ratio: 24 (ref 12–28)
BUN: 20 mg/dL (ref 8–27)
CO2: 22 mmol/L (ref 20–29)
Calcium: 10 mg/dL (ref 8.7–10.3)
Chloride: 99 mmol/L (ref 96–106)
Creatinine, Ser: 0.84 mg/dL (ref 0.57–1.00)
Glucose: 85 mg/dL (ref 70–99)
Potassium: 3.5 mmol/L (ref 3.5–5.2)
Sodium: 139 mmol/L (ref 134–144)
eGFR: 75 mL/min/{1.73_m2} (ref >59)

## 2022-04-09 DIAGNOSIS — I890 Lymphedema, not elsewhere classified: Secondary | ICD-10-CM | POA: Diagnosis not present

## 2022-04-14 DIAGNOSIS — I890 Lymphedema, not elsewhere classified: Secondary | ICD-10-CM | POA: Diagnosis not present

## 2022-04-17 DIAGNOSIS — I890 Lymphedema, not elsewhere classified: Secondary | ICD-10-CM | POA: Diagnosis not present

## 2022-04-20 DIAGNOSIS — S99921A Unspecified injury of right foot, initial encounter: Secondary | ICD-10-CM | POA: Diagnosis not present

## 2022-04-22 ENCOUNTER — Other Ambulatory Visit: Payer: Self-pay | Admitting: Neurology

## 2022-04-29 DIAGNOSIS — Z1231 Encounter for screening mammogram for malignant neoplasm of breast: Secondary | ICD-10-CM | POA: Diagnosis not present

## 2022-05-08 DIAGNOSIS — R059 Cough, unspecified: Secondary | ICD-10-CM | POA: Diagnosis not present

## 2022-05-08 DIAGNOSIS — R062 Wheezing: Secondary | ICD-10-CM | POA: Diagnosis not present

## 2022-05-12 DIAGNOSIS — I890 Lymphedema, not elsewhere classified: Secondary | ICD-10-CM | POA: Diagnosis not present

## 2022-05-14 DIAGNOSIS — G4733 Obstructive sleep apnea (adult) (pediatric): Secondary | ICD-10-CM | POA: Diagnosis not present

## 2022-05-15 DIAGNOSIS — I890 Lymphedema, not elsewhere classified: Secondary | ICD-10-CM | POA: Diagnosis not present

## 2022-05-18 DIAGNOSIS — I890 Lymphedema, not elsewhere classified: Secondary | ICD-10-CM | POA: Diagnosis not present
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
## 2022-06-11 ENCOUNTER — Encounter: Payer: Self-pay | Admitting: Adult Health

## 2022-06-11 ENCOUNTER — Ambulatory Visit: Payer: Medicare PPO | Admitting: Adult Health

## 2022-06-11 VITALS — BP 143/77 | HR 70 | Ht 62.0 in | Wt 290.4 lb

## 2022-06-11 DIAGNOSIS — G4733 Obstructive sleep apnea (adult) (pediatric): Secondary | ICD-10-CM

## 2022-06-11 DIAGNOSIS — G2581 Restless legs syndrome: Secondary | ICD-10-CM

## 2022-06-11 DIAGNOSIS — R569 Unspecified convulsions: Secondary | ICD-10-CM

## 2022-06-11 NOTE — Patient Instructions (Signed)
Your Plan: ? ?Continue ? ? ? ? ?Thank you for coming to see us at [facility]. I hope we have been able to provide you high quality care today. ? ?You may receive a patient satisfaction survey over the next few weeks. We would appreciate your feedback and comments so that we may continue to improve ourselves and the health of our patients. ? ?

## 2022-06-14 DIAGNOSIS — G4733 Obstructive sleep apnea (adult) (pediatric): Secondary | ICD-10-CM | POA: Diagnosis not present

## 2022-06-22 ENCOUNTER — Encounter: Payer: Self-pay | Admitting: Nurse Practitioner

## 2022-06-22 ENCOUNTER — Ambulatory Visit: Payer: Medicare PPO | Attending: Nurse Practitioner | Admitting: Nurse Practitioner

## 2022-06-22 VITALS — BP 136/70 | HR 81 | Ht 62.0 in | Wt 285.8 lb

## 2022-06-22 DIAGNOSIS — I10 Essential (primary) hypertension: Secondary | ICD-10-CM | POA: Diagnosis not present

## 2022-06-22 DIAGNOSIS — R600 Localized edema: Secondary | ICD-10-CM

## 2022-06-22 DIAGNOSIS — Z87898 Personal history of other specified conditions: Secondary | ICD-10-CM | POA: Diagnosis not present

## 2022-06-22 DIAGNOSIS — G4733 Obstructive sleep apnea (adult) (pediatric): Secondary | ICD-10-CM | POA: Diagnosis not present
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
## 2022-06-22 NOTE — Patient Instructions (Signed)
Medication Instructions:  Your physician recommends that you continue on your current medications as directed. Please refer to the Current Medication list given to you today.  *If you need a refill on your cardiac medications before your next appointment, please call your pharmacy*   Lab Work: NONE ordered at this time of appointment   If you have labs (blood work) drawn today and your tests are completely normal, you will receive your results only by: [facility] (if you have MyChart) OR A paper copy in the mail If you have any lab test that is abnormal or we need to change your treatment, we will call you to review the results.   Testing/Procedures: NONE ordered at this time of appointment     Follow-Up: At [facility], you and your health needs are our priority.  As part of our continuing mission to provide you with exceptional heart care, we have created designated Provider Care Teams.  These Care Teams include your primary Cardiologist (physician) and Advanced Practice Providers (APPs -  Physician Assistants and Nurse Practitioners) who all work together to provide you with the care you need, when you need it.  We recommend signing up for the patient portal called "MyChart".  Sign up information is provided on this After Visit Summary.  MyChart is used to connect with patients for Virtual Visits (Telemedicine).  Patients are able to view lab/test results, encounter notes, upcoming appointments, etc.  Non-urgent messages can be sent to your provider as well.   To learn more about what you can do with MyChart, go to [email or web address].    Your next appointment:    Keep Follow up   Provider:   Glenetta Hew, MD     Other Instructions

## 2022-06-30 DIAGNOSIS — L8990 Pressure ulcer of unspecified site, unspecified stage: Secondary | ICD-10-CM | POA: Diagnosis not present

## 2022-06-30 DIAGNOSIS — Z6841 Body Mass Index (BMI) 40.0 and over, adult: Secondary | ICD-10-CM | POA: Diagnosis not present

## 2022-06-30 DIAGNOSIS — G40909 Epilepsy, unspecified, not intractable, without status epilepticus: Secondary | ICD-10-CM | POA: Diagnosis not present

## 2022-06-30 DIAGNOSIS — S81801A Unspecified open wound, right lower leg, initial encounter: Secondary | ICD-10-CM | POA: Diagnosis not present

## 2022-06-30 DIAGNOSIS — Z993 Dependence on wheelchair: Secondary | ICD-10-CM | POA: Diagnosis not present

## 2022-06-30 DIAGNOSIS — F330 Major depressive disorder, recurrent, mild: Secondary | ICD-10-CM | POA: Diagnosis not present

## 2022-07-08 DIAGNOSIS — M47896 Other spondylosis, lumbar region: Secondary | ICD-10-CM | POA: Diagnosis not present

## 2022-07-08 DIAGNOSIS — L989 Disorder of the skin and subcutaneous tissue, unspecified: Secondary | ICD-10-CM | POA: Diagnosis not present

## 2022-07-08 DIAGNOSIS — M1611 Unilateral primary osteoarthritis, right hip: Secondary | ICD-10-CM | POA: Diagnosis not present

## 2022-07-13 DIAGNOSIS — G4733 Obstructive sleep apnea (adult) (pediatric): Secondary | ICD-10-CM | POA: Diagnosis not present

## 2022-07-14 DIAGNOSIS — I890 Lymphedema, not elsewhere classified: Secondary | ICD-10-CM | POA: Diagnosis not present

## 2022-07-20 DIAGNOSIS — Z79899 Other long term (current) drug therapy: Secondary | ICD-10-CM | POA: Diagnosis not present

## 2022-07-20 DIAGNOSIS — D649 Anemia, unspecified: Secondary | ICD-10-CM | POA: Diagnosis not present

## 2022-07-20 DIAGNOSIS — E538 Deficiency of other specified B group vitamins: Secondary | ICD-10-CM | POA: Diagnosis not present

## 2022-07-20 DIAGNOSIS — Z6841 Body Mass Index (BMI) 40.0 and over, adult: Secondary | ICD-10-CM | POA: Diagnosis not present

## 2022-07-20 DIAGNOSIS — F330 Major depressive disorder, recurrent, mild: Secondary | ICD-10-CM | POA: Diagnosis not present

## 2022-07-20 DIAGNOSIS — Z0000 Encounter for general adult medical examination without abnormal findings: Secondary | ICD-10-CM | POA: Diagnosis not present

## 2022-07-20 DIAGNOSIS — I700 Atherosclerosis of aorta: Secondary | ICD-10-CM | POA: Diagnosis not present

## 2022-07-20 DIAGNOSIS — G40909 Epilepsy, unspecified, not intractable, without status epilepticus: Secondary | ICD-10-CM | POA: Diagnosis not present

## 2022-07-20 DIAGNOSIS — E559 Vitamin D deficiency, unspecified: Secondary | ICD-10-CM | POA: Diagnosis not present

## 2022-07-20 DIAGNOSIS — R350 Frequency of micturition: Secondary | ICD-10-CM | POA: Diagnosis not present

## 2022-07-21 DIAGNOSIS — I890 Lymphedema, not elsewhere classified: Secondary | ICD-10-CM | POA: Diagnosis not present

## 2022-07-21 LAB — LAB REPORT - SCANNED: EGFR: 81

## 2022-07-27 DIAGNOSIS — I890 Lymphedema, not elsewhere classified: Secondary | ICD-10-CM | POA: Diagnosis not present

## 2022-07-28 DIAGNOSIS — I872 Venous insufficiency (chronic) (peripheral): Secondary | ICD-10-CM | POA: Diagnosis not present

## 2022-07-28 DIAGNOSIS — J4520 Mild intermittent asthma, uncomplicated: Secondary | ICD-10-CM | POA: Diagnosis not present

## 2022-07-28 DIAGNOSIS — I129 Hypertensive chronic kidney disease with stage 1 through stage 4 chronic kidney disease, or unspecified chronic kidney disease: Secondary | ICD-10-CM | POA: Diagnosis not present

## 2022-07-28 DIAGNOSIS — S71102D Unspecified open wound, left thigh, subsequent encounter: Secondary | ICD-10-CM | POA: Diagnosis not present

## 2022-07-28 DIAGNOSIS — N182 Chronic kidney disease, stage 2 (mild): Secondary | ICD-10-CM | POA: Diagnosis not present

## 2022-07-28 DIAGNOSIS — I890 Lymphedema, not elsewhere classified: Secondary | ICD-10-CM | POA: Diagnosis not present

## 2022-07-28 DIAGNOSIS — L89892 Pressure ulcer of other site, stage 2: Secondary | ICD-10-CM | POA: Diagnosis not present

## 2022-07-28 DIAGNOSIS — D631 Anemia in chronic kidney disease: Secondary | ICD-10-CM | POA: Diagnosis not present

## 2022-07-28 DIAGNOSIS — G40309 Generalized idiopathic epilepsy and epileptic syndromes, not intractable, without status epilepticus: Secondary | ICD-10-CM | POA: Diagnosis not present

## 2022-08-03 DIAGNOSIS — L03115 Cellulitis of right lower limb: Secondary | ICD-10-CM | POA: Diagnosis not present

## 2022-08-03 DIAGNOSIS — I890 Lymphedema, not elsewhere classified: Secondary | ICD-10-CM | POA: Diagnosis not present

## 2022-08-04 DIAGNOSIS — I890 Lymphedema, not elsewhere classified: Secondary | ICD-10-CM | POA: Diagnosis not present

## 2022-08-04 DIAGNOSIS — L89892 Pressure ulcer of other site, stage 2: Secondary | ICD-10-CM | POA: Diagnosis not present

## 2022-08-04 DIAGNOSIS — N182 Chronic kidney disease, stage 2 (mild): Secondary | ICD-10-CM | POA: Diagnosis not present

## 2022-08-04 DIAGNOSIS — D631 Anemia in chronic kidney disease: Secondary | ICD-10-CM | POA: Diagnosis not present

## 2022-08-04 DIAGNOSIS — I872 Venous insufficiency (chronic) (peripheral): Secondary | ICD-10-CM | POA: Diagnosis not present

## 2022-08-04 DIAGNOSIS — S71102D Unspecified open wound, left thigh, subsequent encounter: Secondary | ICD-10-CM | POA: Diagnosis not present

## 2022-08-04 DIAGNOSIS — G40309 Generalized idiopathic epilepsy and epileptic syndromes, not intractable, without status epilepticus: Secondary | ICD-10-CM | POA: Diagnosis not present

## 2022-08-04 DIAGNOSIS — I129 Hypertensive chronic kidney disease with stage 1 through stage 4 chronic kidney disease, or unspecified chronic kidney disease: Secondary | ICD-10-CM | POA: Diagnosis not present

## 2022-08-04 DIAGNOSIS — J4520 Mild intermittent asthma, uncomplicated: Secondary | ICD-10-CM | POA: Diagnosis not present

## 2022-08-05 DIAGNOSIS — S71102D Unspecified open wound, left thigh, subsequent encounter: Secondary | ICD-10-CM | POA: Diagnosis not present

## 2022-08-05 DIAGNOSIS — I872 Venous insufficiency (chronic) (peripheral): Secondary | ICD-10-CM | POA: Diagnosis not present

## 2022-08-05 DIAGNOSIS — J4520 Mild intermittent asthma, uncomplicated: Secondary | ICD-10-CM | POA: Diagnosis not present

## 2022-08-05 DIAGNOSIS — I129 Hypertensive chronic kidney disease with stage 1 through stage 4 chronic kidney disease, or unspecified chronic kidney disease: Secondary | ICD-10-CM | POA: Diagnosis not present

## 2022-08-05 DIAGNOSIS — N182 Chronic kidney disease, stage 2 (mild): Secondary | ICD-10-CM | POA: Diagnosis not present

## 2022-08-05 DIAGNOSIS — I890 Lymphedema, not elsewhere classified: Secondary | ICD-10-CM | POA: Diagnosis not present

## 2022-08-05 DIAGNOSIS — D631 Anemia in chronic kidney disease: Secondary | ICD-10-CM | POA: Diagnosis not present

## 2022-08-05 DIAGNOSIS — L89892 Pressure ulcer of other site, stage 2: Secondary | ICD-10-CM | POA: Diagnosis not present

## 2022-08-05 DIAGNOSIS — G40309 Generalized idiopathic epilepsy and epileptic syndromes, not intractable, without status epilepticus: Secondary | ICD-10-CM | POA: Diagnosis not present

## 2022-08-11 DIAGNOSIS — S71102D Unspecified open wound, left thigh, subsequent encounter: Secondary | ICD-10-CM | POA: Diagnosis not present

## 2022-08-11 DIAGNOSIS — G40309 Generalized idiopathic epilepsy and epileptic syndromes, not intractable, without status epilepticus: Secondary | ICD-10-CM | POA: Diagnosis not present

## 2022-08-11 DIAGNOSIS — I872 Venous insufficiency (chronic) (peripheral): Secondary | ICD-10-CM | POA: Diagnosis not present

## 2022-08-11 DIAGNOSIS — L89892 Pressure ulcer of other site, stage 2: Secondary | ICD-10-CM | POA: Diagnosis not present

## 2022-08-11 DIAGNOSIS — I890 Lymphedema, not elsewhere classified: Secondary | ICD-10-CM | POA: Diagnosis not present

## 2022-08-11 DIAGNOSIS — N182 Chronic kidney disease, stage 2 (mild): Secondary | ICD-10-CM | POA: Diagnosis not present

## 2022-08-11 DIAGNOSIS — I129 Hypertensive chronic kidney disease with stage 1 through stage 4 chronic kidney disease, or unspecified chronic kidney disease: Secondary | ICD-10-CM | POA: Diagnosis not present

## 2022-08-11 DIAGNOSIS — J4520 Mild intermittent asthma, uncomplicated: Secondary | ICD-10-CM | POA: Diagnosis not present

## 2022-08-11 DIAGNOSIS — D631 Anemia in chronic kidney disease: Secondary | ICD-10-CM | POA: Diagnosis not present

## 2022-08-13 DIAGNOSIS — G4733 Obstructive sleep apnea (adult) (pediatric): Secondary | ICD-10-CM | POA: Diagnosis not present

## 2022-08-14 DIAGNOSIS — J4520 Mild intermittent asthma, uncomplicated: Secondary | ICD-10-CM | POA: Diagnosis not present

## 2022-08-14 DIAGNOSIS — I890 Lymphedema, not elsewhere classified: Secondary | ICD-10-CM | POA: Diagnosis not present

## 2022-08-14 DIAGNOSIS — N182 Chronic kidney disease, stage 2 (mild): Secondary | ICD-10-CM | POA: Diagnosis not present

## 2022-08-14 DIAGNOSIS — I872 Venous insufficiency (chronic) (peripheral): Secondary | ICD-10-CM | POA: Diagnosis not present

## 2022-08-14 DIAGNOSIS — S71102D Unspecified open wound, left thigh, subsequent encounter: Secondary | ICD-10-CM | POA: Diagnosis not present

## 2022-08-14 DIAGNOSIS — G40309 Generalized idiopathic epilepsy and epileptic syndromes, not intractable, without status epilepticus: Secondary | ICD-10-CM | POA: Diagnosis not present

## 2022-08-14 DIAGNOSIS — D631 Anemia in chronic kidney disease: Secondary | ICD-10-CM | POA: Diagnosis not present

## 2022-08-14 DIAGNOSIS — L89892 Pressure ulcer of other site, stage 2: Secondary | ICD-10-CM | POA: Diagnosis not present

## 2022-08-14 DIAGNOSIS — I129 Hypertensive chronic kidney disease with stage 1 through stage 4 chronic kidney disease, or unspecified chronic kidney disease: Secondary | ICD-10-CM | POA: Diagnosis not present

## 2022-08-18 DIAGNOSIS — S71102D Unspecified open wound, left thigh, subsequent encounter: Secondary | ICD-10-CM | POA: Diagnosis not present

## 2022-08-18 DIAGNOSIS — D631 Anemia in chronic kidney disease: Secondary | ICD-10-CM | POA: Diagnosis not present

## 2022-08-18 DIAGNOSIS — J4520 Mild intermittent asthma, uncomplicated: Secondary | ICD-10-CM | POA: Diagnosis not present

## 2022-08-18 DIAGNOSIS — L89892 Pressure ulcer of other site, stage 2: Secondary | ICD-10-CM | POA: Diagnosis not present

## 2022-08-18 DIAGNOSIS — I872 Venous insufficiency (chronic) (peripheral): Secondary | ICD-10-CM | POA: Diagnosis not present

## 2022-08-18 DIAGNOSIS — I129 Hypertensive chronic kidney disease with stage 1 through stage 4 chronic kidney disease, or unspecified chronic kidney disease: Secondary | ICD-10-CM | POA: Diagnosis not present

## 2022-08-18 DIAGNOSIS — N182 Chronic kidney disease, stage 2 (mild): Secondary | ICD-10-CM | POA: Diagnosis not present

## 2022-08-18 DIAGNOSIS — I890 Lymphedema, not elsewhere classified: Secondary | ICD-10-CM | POA: Diagnosis not present

## 2022-08-18 DIAGNOSIS — G40309 Generalized idiopathic epilepsy and epileptic syndromes, not intractable, without status epilepticus: Secondary | ICD-10-CM | POA: Diagnosis not present

## 2022-08-19 DIAGNOSIS — I129 Hypertensive chronic kidney disease with stage 1 through stage 4 chronic kidney disease, or unspecified chronic kidney disease: Secondary | ICD-10-CM | POA: Diagnosis not present

## 2022-08-19 DIAGNOSIS — I872 Venous insufficiency (chronic) (peripheral): Secondary | ICD-10-CM | POA: Diagnosis not present

## 2022-08-19 DIAGNOSIS — S71102D Unspecified open wound, left thigh, subsequent encounter: Secondary | ICD-10-CM | POA: Diagnosis not present

## 2022-08-19 DIAGNOSIS — G40309 Generalized idiopathic epilepsy and epileptic syndromes, not intractable, without status epilepticus: Secondary | ICD-10-CM | POA: Diagnosis not present

## 2022-08-19 DIAGNOSIS — J4520 Mild intermittent asthma, uncomplicated: Secondary | ICD-10-CM | POA: Diagnosis not present

## 2022-08-19 DIAGNOSIS — D631 Anemia in chronic kidney disease: Secondary | ICD-10-CM | POA: Diagnosis not present

## 2022-08-19 DIAGNOSIS — N182 Chronic kidney disease, stage 2 (mild): Secondary | ICD-10-CM | POA: Diagnosis not present

## 2022-08-19 DIAGNOSIS — I890 Lymphedema, not elsewhere classified: Secondary | ICD-10-CM | POA: Diagnosis not present

## 2022-08-19 DIAGNOSIS — L89892 Pressure ulcer of other site, stage 2: Secondary | ICD-10-CM | POA: Diagnosis not present

## 2022-08-24 DIAGNOSIS — I872 Venous insufficiency (chronic) (peripheral): Secondary | ICD-10-CM | POA: Diagnosis not present

## 2022-08-24 DIAGNOSIS — G40309 Generalized idiopathic epilepsy and epileptic syndromes, not intractable, without status epilepticus: Secondary | ICD-10-CM | POA: Diagnosis not present

## 2022-08-24 DIAGNOSIS — I890 Lymphedema, not elsewhere classified: Secondary | ICD-10-CM | POA: Diagnosis not present

## 2022-08-24 DIAGNOSIS — J4520 Mild intermittent asthma, uncomplicated: Secondary | ICD-10-CM | POA: Diagnosis not present

## 2022-08-24 DIAGNOSIS — L89892 Pressure ulcer of other site, stage 2: Secondary | ICD-10-CM | POA: Diagnosis not present

## 2022-08-24 DIAGNOSIS — N182 Chronic kidney disease, stage 2 (mild): Secondary | ICD-10-CM | POA: Diagnosis not present

## 2022-08-24 DIAGNOSIS — I129 Hypertensive chronic kidney disease with stage 1 through stage 4 chronic kidney disease, or unspecified chronic kidney disease: Secondary | ICD-10-CM | POA: Diagnosis not present

## 2022-08-24 DIAGNOSIS — S71102D Unspecified open wound, left thigh, subsequent encounter: Secondary | ICD-10-CM | POA: Diagnosis not present

## 2022-08-24 DIAGNOSIS — D631 Anemia in chronic kidney disease: Secondary | ICD-10-CM | POA: Diagnosis not present

## 2022-09-01 DIAGNOSIS — J4520 Mild intermittent asthma, uncomplicated: Secondary | ICD-10-CM | POA: Diagnosis not present

## 2022-09-01 DIAGNOSIS — I890 Lymphedema, not elsewhere classified: Secondary | ICD-10-CM | POA: Diagnosis not present

## 2022-09-01 DIAGNOSIS — D631 Anemia in chronic kidney disease: Secondary | ICD-10-CM | POA: Diagnosis not present

## 2022-09-01 DIAGNOSIS — S71102D Unspecified open wound, left thigh, subsequent encounter: Secondary | ICD-10-CM | POA: Diagnosis not present

## 2022-09-01 DIAGNOSIS — G40309 Generalized idiopathic epilepsy and epileptic syndromes, not intractable, without status epilepticus: Secondary | ICD-10-CM | POA: Diagnosis not present

## 2022-09-01 DIAGNOSIS — I129 Hypertensive chronic kidney disease with stage 1 through stage 4 chronic kidney disease, or unspecified chronic kidney disease: Secondary | ICD-10-CM | POA: Diagnosis not present

## 2022-09-01 DIAGNOSIS — I872 Venous insufficiency (chronic) (peripheral): Secondary | ICD-10-CM | POA: Diagnosis not present

## 2022-09-01 DIAGNOSIS — L89892 Pressure ulcer of other site, stage 2: Secondary | ICD-10-CM | POA: Diagnosis not present

## 2022-09-01 DIAGNOSIS — N182 Chronic kidney disease, stage 2 (mild): Secondary | ICD-10-CM | POA: Diagnosis not present

## 2022-09-04 DIAGNOSIS — L03116 Cellulitis of left lower limb: Secondary | ICD-10-CM | POA: Diagnosis not present

## 2022-09-04 DIAGNOSIS — L03115 Cellulitis of right lower limb: Secondary | ICD-10-CM | POA: Diagnosis not present

## 2022-09-04 DIAGNOSIS — Z6841 Body Mass Index (BMI) 40.0 and over, adult: Secondary | ICD-10-CM | POA: Diagnosis not present

## 2022-09-06 ENCOUNTER — Other Ambulatory Visit: Payer: Self-pay | Admitting: Neurology

## 2022-09-06 DIAGNOSIS — G2581 Restless legs syndrome: Secondary | ICD-10-CM

## 2022-09-08 DIAGNOSIS — J4520 Mild intermittent asthma, uncomplicated: Secondary | ICD-10-CM | POA: Diagnosis not present

## 2022-09-08 DIAGNOSIS — S71102D Unspecified open wound, left thigh, subsequent encounter: Secondary | ICD-10-CM | POA: Diagnosis not present

## 2022-09-08 DIAGNOSIS — N182 Chronic kidney disease, stage 2 (mild): Secondary | ICD-10-CM | POA: Diagnosis not present

## 2022-09-08 DIAGNOSIS — I890 Lymphedema, not elsewhere classified: Secondary | ICD-10-CM | POA: Diagnosis not present

## 2022-09-08 DIAGNOSIS — L89892 Pressure ulcer of other site, stage 2: Secondary | ICD-10-CM | POA: Diagnosis not present

## 2022-09-08 DIAGNOSIS — G4733 Obstructive sleep apnea (adult) (pediatric): Secondary | ICD-10-CM | POA: Diagnosis not present

## 2022-09-08 DIAGNOSIS — I872 Venous insufficiency (chronic) (peripheral): Secondary | ICD-10-CM | POA: Diagnosis not present

## 2022-09-08 DIAGNOSIS — I129 Hypertensive chronic kidney disease with stage 1 through stage 4 chronic kidney disease, or unspecified chronic kidney disease: Secondary | ICD-10-CM | POA: Diagnosis not present

## 2022-09-08 DIAGNOSIS — D631 Anemia in chronic kidney disease: Secondary | ICD-10-CM | POA: Diagnosis not present

## 2022-09-08 DIAGNOSIS — G40309 Generalized idiopathic epilepsy and epileptic syndromes, not intractable, without status epilepticus: Secondary | ICD-10-CM | POA: Diagnosis not present

## 2022-09-11 DIAGNOSIS — I890 Lymphedema, not elsewhere classified: Secondary | ICD-10-CM | POA: Diagnosis not present

## 2022-09-11 DIAGNOSIS — S71102D Unspecified open wound, left thigh, subsequent encounter: Secondary | ICD-10-CM | POA: Diagnosis not present

## 2022-09-11 DIAGNOSIS — L89892 Pressure ulcer of other site, stage 2: Secondary | ICD-10-CM | POA: Diagnosis not present

## 2022-09-11 DIAGNOSIS — J4520 Mild intermittent asthma, uncomplicated: Secondary | ICD-10-CM | POA: Diagnosis not present

## 2022-09-11 DIAGNOSIS — I872 Venous insufficiency (chronic) (peripheral): Secondary | ICD-10-CM | POA: Diagnosis not present

## 2022-09-11 DIAGNOSIS — I129 Hypertensive chronic kidney disease with stage 1 through stage 4 chronic kidney disease, or unspecified chronic kidney disease: Secondary | ICD-10-CM | POA: Diagnosis not present

## 2022-09-11 DIAGNOSIS — D631 Anemia in chronic kidney disease: Secondary | ICD-10-CM | POA: Diagnosis not present

## 2022-09-11 DIAGNOSIS — G40309 Generalized idiopathic epilepsy and epileptic syndromes, not intractable, without status epilepticus: Secondary | ICD-10-CM | POA: Diagnosis not present

## 2022-09-11 DIAGNOSIS — N182 Chronic kidney disease, stage 2 (mild): Secondary | ICD-10-CM | POA: Diagnosis not present

## 2022-09-12 DIAGNOSIS — G4733 Obstructive sleep apnea (adult) (pediatric): Secondary | ICD-10-CM | POA: Diagnosis not present

## 2022-09-14 DIAGNOSIS — F419 Anxiety disorder, unspecified: Secondary | ICD-10-CM | POA: Diagnosis not present

## 2022-09-14 DIAGNOSIS — L89202 Pressure ulcer of unspecified hip, stage 2: Secondary | ICD-10-CM | POA: Diagnosis not present

## 2022-09-14 DIAGNOSIS — M2040 Other hammer toe(s) (acquired), unspecified foot: Secondary | ICD-10-CM | POA: Diagnosis not present

## 2022-09-14 DIAGNOSIS — G4700 Insomnia, unspecified: Secondary | ICD-10-CM | POA: Diagnosis not present

## 2022-09-14 DIAGNOSIS — Z87892 Personal history of anaphylaxis: Secondary | ICD-10-CM | POA: Diagnosis not present

## 2022-09-14 DIAGNOSIS — I70209 Unspecified atherosclerosis of native arteries of extremities, unspecified extremity: Secondary | ICD-10-CM | POA: Diagnosis not present

## 2022-09-14 DIAGNOSIS — G629 Polyneuropathy, unspecified: Secondary | ICD-10-CM | POA: Diagnosis not present

## 2022-09-14 DIAGNOSIS — K219 Gastro-esophageal reflux disease without esophagitis: Secondary | ICD-10-CM | POA: Diagnosis not present

## 2022-09-14 DIAGNOSIS — G4733 Obstructive sleep apnea (adult) (pediatric): Secondary | ICD-10-CM | POA: Diagnosis not present

## 2022-09-14 DIAGNOSIS — Z91041 Radiographic dye allergy status: Secondary | ICD-10-CM | POA: Diagnosis not present

## 2022-09-14 DIAGNOSIS — J45909 Unspecified asthma, uncomplicated: Secondary | ICD-10-CM | POA: Diagnosis not present

## 2022-09-14 DIAGNOSIS — I720 Aneurysm of carotid artery: Secondary | ICD-10-CM | POA: Diagnosis not present

## 2022-09-14 DIAGNOSIS — R32 Unspecified urinary incontinence: Secondary | ICD-10-CM | POA: Diagnosis not present

## 2022-09-14 DIAGNOSIS — M1990 Unspecified osteoarthritis, unspecified site: Secondary | ICD-10-CM | POA: Diagnosis not present

## 2022-09-14 DIAGNOSIS — D6949 Other primary thrombocytopenia: Secondary | ICD-10-CM | POA: Diagnosis not present

## 2022-09-14 DIAGNOSIS — G40909 Epilepsy, unspecified, not intractable, without status epilepticus: Secondary | ICD-10-CM | POA: Diagnosis not present

## 2022-09-14 DIAGNOSIS — E785 Hyperlipidemia, unspecified: Secondary | ICD-10-CM | POA: Diagnosis not present

## 2022-09-14 DIAGNOSIS — Z885 Allergy status to narcotic agent status: Secondary | ICD-10-CM | POA: Diagnosis not present

## 2022-09-16 DIAGNOSIS — N182 Chronic kidney disease, stage 2 (mild): Secondary | ICD-10-CM | POA: Diagnosis not present

## 2022-09-16 DIAGNOSIS — I872 Venous insufficiency (chronic) (peripheral): Secondary | ICD-10-CM | POA: Diagnosis not present

## 2022-09-16 DIAGNOSIS — L89892 Pressure ulcer of other site, stage 2: Secondary | ICD-10-CM | POA: Diagnosis not present

## 2022-09-16 DIAGNOSIS — J4520 Mild intermittent asthma, uncomplicated: Secondary | ICD-10-CM | POA: Diagnosis not present

## 2022-09-16 DIAGNOSIS — I129 Hypertensive chronic kidney disease with stage 1 through stage 4 chronic kidney disease, or unspecified chronic kidney disease: Secondary | ICD-10-CM | POA: Diagnosis not present

## 2022-09-16 DIAGNOSIS — D631 Anemia in chronic kidney disease: Secondary | ICD-10-CM | POA: Diagnosis not present

## 2022-09-16 DIAGNOSIS — I890 Lymphedema, not elsewhere classified: Secondary | ICD-10-CM | POA: Diagnosis not present

## 2022-09-16 DIAGNOSIS — S71102D Unspecified open wound, left thigh, subsequent encounter: Secondary | ICD-10-CM | POA: Diagnosis not present

## 2022-09-16 DIAGNOSIS — G40309 Generalized idiopathic epilepsy and epileptic syndromes, not intractable, without status epilepticus: Secondary | ICD-10-CM | POA: Diagnosis not present

## 2022-09-21 ENCOUNTER — Encounter: Payer: Self-pay | Admitting: Cardiology

## 2022-09-21 ENCOUNTER — Ambulatory Visit: Payer: Medicare PPO | Attending: Cardiology | Admitting: Cardiology

## 2022-09-21 VITALS — BP 142/74 | HR 69 | Ht 62.0 in | Wt 273.0 lb

## 2022-09-21 DIAGNOSIS — E782 Mixed hyperlipidemia: Secondary | ICD-10-CM | POA: Diagnosis not present

## 2022-09-21 DIAGNOSIS — I890 Lymphedema, not elsewhere classified: Secondary | ICD-10-CM

## 2022-09-21 DIAGNOSIS — G4733 Obstructive sleep apnea (adult) (pediatric): Secondary | ICD-10-CM

## 2022-09-21 DIAGNOSIS — R600 Localized edema: Secondary | ICD-10-CM | POA: Diagnosis not present

## 2022-09-21 DIAGNOSIS — I10 Essential (primary) hypertension: Secondary | ICD-10-CM | POA: Diagnosis not present

## 2022-09-21 NOTE — Patient Instructions (Signed)
Medication Instructions:   No changes    Lab Work: Not needed    Testing/Procedures:  Not needed  Follow-Up: At [facility], you and your health needs are our priority.  As part of our continuing mission to provide you with exceptional heart care, we have created designated Provider Care Teams.  These Care Teams include your primary Cardiologist (physician) and Advanced Practice Providers (APPs -  Physician Assistants and Nurse Practitioners) who all work together to provide you with the care you need, when you need it.     Your next appointment:   6 month(s)  The format for your next appointment:   In Person  Provider:    With Bernadene Person NP    then Bryan Lemma, MD in 12 months   Other Instructions    Keep eye blood pressure  --  if blood pressure ranges is drifting and staying in the 140/ 90's  -  we may increase dose of Valsartan at next visit
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
## 2022-09-22 DIAGNOSIS — L89892 Pressure ulcer of other site, stage 2: Secondary | ICD-10-CM | POA: Diagnosis not present

## 2022-09-22 DIAGNOSIS — J4520 Mild intermittent asthma, uncomplicated: Secondary | ICD-10-CM | POA: Diagnosis not present

## 2022-09-22 DIAGNOSIS — I890 Lymphedema, not elsewhere classified: Secondary | ICD-10-CM | POA: Diagnosis not present

## 2022-09-22 DIAGNOSIS — D631 Anemia in chronic kidney disease: Secondary | ICD-10-CM | POA: Diagnosis not present

## 2022-09-22 DIAGNOSIS — N182 Chronic kidney disease, stage 2 (mild): Secondary | ICD-10-CM | POA: Diagnosis not present

## 2022-09-22 DIAGNOSIS — I872 Venous insufficiency (chronic) (peripheral): Secondary | ICD-10-CM | POA: Diagnosis not present

## 2022-09-22 DIAGNOSIS — I129 Hypertensive chronic kidney disease with stage 1 through stage 4 chronic kidney disease, or unspecified chronic kidney disease: Secondary | ICD-10-CM | POA: Diagnosis not present

## 2022-09-22 DIAGNOSIS — S71102D Unspecified open wound, left thigh, subsequent encounter: Secondary | ICD-10-CM | POA: Diagnosis not present

## 2022-09-22 DIAGNOSIS — G40309 Generalized idiopathic epilepsy and epileptic syndromes, not intractable, without status epilepticus: Secondary | ICD-10-CM | POA: Diagnosis not present

## 2022-09-23 DIAGNOSIS — S71102D Unspecified open wound, left thigh, subsequent encounter: Secondary | ICD-10-CM | POA: Diagnosis not present

## 2022-09-23 DIAGNOSIS — M6281 Muscle weakness (generalized): Secondary | ICD-10-CM | POA: Diagnosis not present

## 2022-09-23 DIAGNOSIS — Z9181 History of falling: Secondary | ICD-10-CM | POA: Diagnosis not present

## 2022-09-23 DIAGNOSIS — I872 Venous insufficiency (chronic) (peripheral): Secondary | ICD-10-CM | POA: Diagnosis not present

## 2022-09-23 DIAGNOSIS — I890 Lymphedema, not elsewhere classified: Secondary | ICD-10-CM | POA: Diagnosis not present

## 2022-09-23 DIAGNOSIS — G40309 Generalized idiopathic epilepsy and epileptic syndromes, not intractable, without status epilepticus: Secondary | ICD-10-CM | POA: Diagnosis not present

## 2022-09-23 DIAGNOSIS — L89892 Pressure ulcer of other site, stage 2: Secondary | ICD-10-CM | POA: Diagnosis not present

## 2022-09-24 DIAGNOSIS — G40309 Generalized idiopathic epilepsy and epileptic syndromes, not intractable, without status epilepticus: Secondary | ICD-10-CM | POA: Diagnosis not present

## 2022-09-24 DIAGNOSIS — L89892 Pressure ulcer of other site, stage 2: Secondary | ICD-10-CM | POA: Diagnosis not present

## 2022-09-24 DIAGNOSIS — D631 Anemia in chronic kidney disease: Secondary | ICD-10-CM | POA: Diagnosis not present

## 2022-09-24 DIAGNOSIS — I129 Hypertensive chronic kidney disease with stage 1 through stage 4 chronic kidney disease, or unspecified chronic kidney disease: Secondary | ICD-10-CM | POA: Diagnosis not present

## 2022-09-24 DIAGNOSIS — I890 Lymphedema, not elsewhere classified: Secondary | ICD-10-CM | POA: Diagnosis not present

## 2022-09-24 DIAGNOSIS — S71102D Unspecified open wound, left thigh, subsequent encounter: Secondary | ICD-10-CM | POA: Diagnosis not present

## 2022-09-24 DIAGNOSIS — I872 Venous insufficiency (chronic) (peripheral): Secondary | ICD-10-CM | POA: Diagnosis not present

## 2022-09-24 DIAGNOSIS — J4520 Mild intermittent asthma, uncomplicated: Secondary | ICD-10-CM | POA: Diagnosis not present

## 2022-09-24 DIAGNOSIS — N182 Chronic kidney disease, stage 2 (mild): Secondary | ICD-10-CM | POA: Diagnosis not present

## 2022-09-26 DIAGNOSIS — I890 Lymphedema, not elsewhere classified: Secondary | ICD-10-CM | POA: Diagnosis not present

## 2022-09-26 DIAGNOSIS — G40309 Generalized idiopathic epilepsy and epileptic syndromes, not intractable, without status epilepticus: Secondary | ICD-10-CM | POA: Diagnosis not present

## 2022-09-26 DIAGNOSIS — N182 Chronic kidney disease, stage 2 (mild): Secondary | ICD-10-CM | POA: Diagnosis not present

## 2022-09-26 DIAGNOSIS — D631 Anemia in chronic kidney disease: Secondary | ICD-10-CM | POA: Diagnosis not present

## 2022-09-26 DIAGNOSIS — J4520 Mild intermittent asthma, uncomplicated: Secondary | ICD-10-CM | POA: Diagnosis not present

## 2022-09-26 DIAGNOSIS — E538 Deficiency of other specified B group vitamins: Secondary | ICD-10-CM | POA: Diagnosis not present

## 2022-09-26 DIAGNOSIS — I129 Hypertensive chronic kidney disease with stage 1 through stage 4 chronic kidney disease, or unspecified chronic kidney disease: Secondary | ICD-10-CM | POA: Diagnosis not present

## 2022-09-26 DIAGNOSIS — I872 Venous insufficiency (chronic) (peripheral): Secondary | ICD-10-CM | POA: Diagnosis not present

## 2022-09-26 DIAGNOSIS — S71102D Unspecified open wound, left thigh, subsequent encounter: Secondary | ICD-10-CM | POA: Diagnosis not present

## 2022-10-01 DIAGNOSIS — S71102D Unspecified open wound, left thigh, subsequent encounter: Secondary | ICD-10-CM | POA: Diagnosis not present

## 2022-10-01 DIAGNOSIS — D631 Anemia in chronic kidney disease: Secondary | ICD-10-CM | POA: Diagnosis not present

## 2022-10-01 DIAGNOSIS — I890 Lymphedema, not elsewhere classified: Secondary | ICD-10-CM | POA: Diagnosis not present

## 2022-10-01 DIAGNOSIS — G40309 Generalized idiopathic epilepsy and epileptic syndromes, not intractable, without status epilepticus: Secondary | ICD-10-CM | POA: Diagnosis not present

## 2022-10-01 DIAGNOSIS — I872 Venous insufficiency (chronic) (peripheral): Secondary | ICD-10-CM | POA: Diagnosis not present

## 2022-10-01 DIAGNOSIS — J4520 Mild intermittent asthma, uncomplicated: Secondary | ICD-10-CM | POA: Diagnosis not present

## 2022-10-01 DIAGNOSIS — I129 Hypertensive chronic kidney disease with stage 1 through stage 4 chronic kidney disease, or unspecified chronic kidney disease: Secondary | ICD-10-CM | POA: Diagnosis not present

## 2022-10-01 DIAGNOSIS — E538 Deficiency of other specified B group vitamins: Secondary | ICD-10-CM | POA: Diagnosis not present

## 2022-10-01 DIAGNOSIS — N182 Chronic kidney disease, stage 2 (mild): Secondary | ICD-10-CM | POA: Diagnosis not present

## 2022-10-02 DIAGNOSIS — D631 Anemia in chronic kidney disease: Secondary | ICD-10-CM | POA: Diagnosis not present

## 2022-10-02 DIAGNOSIS — I890 Lymphedema, not elsewhere classified: Secondary | ICD-10-CM | POA: Diagnosis not present

## 2022-10-02 DIAGNOSIS — E538 Deficiency of other specified B group vitamins: Secondary | ICD-10-CM | POA: Diagnosis not present

## 2022-10-02 DIAGNOSIS — I872 Venous insufficiency (chronic) (peripheral): Secondary | ICD-10-CM | POA: Diagnosis not present

## 2022-10-02 DIAGNOSIS — G40309 Generalized idiopathic epilepsy and epileptic syndromes, not intractable, without status epilepticus: Secondary | ICD-10-CM | POA: Diagnosis not present

## 2022-10-02 DIAGNOSIS — J4520 Mild intermittent asthma, uncomplicated: Secondary | ICD-10-CM | POA: Diagnosis not present

## 2022-10-02 DIAGNOSIS — N182 Chronic kidney disease, stage 2 (mild): Secondary | ICD-10-CM | POA: Diagnosis not present

## 2022-10-02 DIAGNOSIS — S71102D Unspecified open wound, left thigh, subsequent encounter: Secondary | ICD-10-CM | POA: Diagnosis not present

## 2022-10-02 DIAGNOSIS — I129 Hypertensive chronic kidney disease with stage 1 through stage 4 chronic kidney disease, or unspecified chronic kidney disease: Secondary | ICD-10-CM | POA: Diagnosis not present

## 2022-10-05 DIAGNOSIS — I872 Venous insufficiency (chronic) (peripheral): Secondary | ICD-10-CM | POA: Diagnosis not present

## 2022-10-05 DIAGNOSIS — E538 Deficiency of other specified B group vitamins: Secondary | ICD-10-CM | POA: Diagnosis not present

## 2022-10-05 DIAGNOSIS — N182 Chronic kidney disease, stage 2 (mild): Secondary | ICD-10-CM | POA: Diagnosis not present

## 2022-10-05 DIAGNOSIS — G40309 Generalized idiopathic epilepsy and epileptic syndromes, not intractable, without status epilepticus: Secondary | ICD-10-CM | POA: Diagnosis not present

## 2022-10-05 DIAGNOSIS — I129 Hypertensive chronic kidney disease with stage 1 through stage 4 chronic kidney disease, or unspecified chronic kidney disease: Secondary | ICD-10-CM | POA: Diagnosis not present

## 2022-10-05 DIAGNOSIS — S71102D Unspecified open wound, left thigh, subsequent encounter: Secondary | ICD-10-CM | POA: Diagnosis not present

## 2022-10-05 DIAGNOSIS — I890 Lymphedema, not elsewhere classified: Secondary | ICD-10-CM | POA: Diagnosis not present

## 2022-10-05 DIAGNOSIS — D631 Anemia in chronic kidney disease: Secondary | ICD-10-CM | POA: Diagnosis not present

## 2022-10-05 DIAGNOSIS — J4520 Mild intermittent asthma, uncomplicated: Secondary | ICD-10-CM | POA: Diagnosis not present

## 2022-10-07 DIAGNOSIS — N182 Chronic kidney disease, stage 2 (mild): Secondary | ICD-10-CM | POA: Diagnosis not present

## 2022-10-07 DIAGNOSIS — G40309 Generalized idiopathic epilepsy and epileptic syndromes, not intractable, without status epilepticus: Secondary | ICD-10-CM | POA: Diagnosis not present

## 2022-10-07 DIAGNOSIS — D631 Anemia in chronic kidney disease: Secondary | ICD-10-CM | POA: Diagnosis not present

## 2022-10-07 DIAGNOSIS — J4520 Mild intermittent asthma, uncomplicated: Secondary | ICD-10-CM | POA: Diagnosis not present

## 2022-10-07 DIAGNOSIS — S71102D Unspecified open wound, left thigh, subsequent encounter: Secondary | ICD-10-CM | POA: Diagnosis not present

## 2022-10-07 DIAGNOSIS — I890 Lymphedema, not elsewhere classified: Secondary | ICD-10-CM | POA: Diagnosis not present

## 2022-10-07 DIAGNOSIS — I872 Venous insufficiency (chronic) (peripheral): Secondary | ICD-10-CM | POA: Diagnosis not present

## 2022-10-07 DIAGNOSIS — E538 Deficiency of other specified B group vitamins: Secondary | ICD-10-CM | POA: Diagnosis not present

## 2022-10-07 DIAGNOSIS — I129 Hypertensive chronic kidney disease with stage 1 through stage 4 chronic kidney disease, or unspecified chronic kidney disease: Secondary | ICD-10-CM | POA: Diagnosis not present

## 2022-10-09 ENCOUNTER — Encounter: Payer: Self-pay | Admitting: Cardiology

## 2022-10-09 DIAGNOSIS — J4520 Mild intermittent asthma, uncomplicated: Secondary | ICD-10-CM | POA: Diagnosis not present

## 2022-10-09 DIAGNOSIS — I890 Lymphedema, not elsewhere classified: Secondary | ICD-10-CM | POA: Diagnosis not present

## 2022-10-09 DIAGNOSIS — N182 Chronic kidney disease, stage 2 (mild): Secondary | ICD-10-CM | POA: Diagnosis not present

## 2022-10-09 DIAGNOSIS — G40309 Generalized idiopathic epilepsy and epileptic syndromes, not intractable, without status epilepticus: Secondary | ICD-10-CM | POA: Diagnosis not present

## 2022-10-09 DIAGNOSIS — I872 Venous insufficiency (chronic) (peripheral): Secondary | ICD-10-CM | POA: Diagnosis not present

## 2022-10-09 DIAGNOSIS — I129 Hypertensive chronic kidney disease with stage 1 through stage 4 chronic kidney disease, or unspecified chronic kidney disease: Secondary | ICD-10-CM | POA: Diagnosis not present

## 2022-10-09 DIAGNOSIS — E538 Deficiency of other specified B group vitamins: Secondary | ICD-10-CM | POA: Diagnosis not present

## 2022-10-09 DIAGNOSIS — S71102D Unspecified open wound, left thigh, subsequent encounter: Secondary | ICD-10-CM | POA: Diagnosis not present

## 2022-10-09 DIAGNOSIS — D631 Anemia in chronic kidney disease: Secondary | ICD-10-CM | POA: Diagnosis not present

## 2022-10-09 NOTE — Assessment & Plan Note (Signed)
She is using more consistently.  I stressed to her that the CPAP helps her blood pressure and swelling.

## 2022-10-09 NOTE — Assessment & Plan Note (Signed)
Blood pressure is high today but she forgot to take her medicines.  Usually at home is pretty well-controlled.  We talked about that we eventually would like her to get down into the 1 20-1 40, but for now we will just continue current dosing.  I am reluctant to push amlodipine further because of her edema.  She is on high-dose spironolactone and valsartan 80 mg.  If she still stays in this range I would probably stop the valsartan up the next dose of 160 mg.  Renal function seems to be doing well so she she will tolerate that.

## 2022-10-09 NOTE — Assessment & Plan Note (Signed)
I think a lot of this is probably related to her lymphedema but also chronic venous hypertension.  Currently having wound care on the left leg.  Overall doing okay but would like to not have so much edema.  As you is the case, loop diuretics not irregular with getting weight of fluid from venous stasis.  The recommendation is to avoid prolonged standing or sitting.  Elevate feet when possible but it is minimally 3-4 times a day 15 minutes.  Support stockings.

## 2022-10-09 NOTE — Assessment & Plan Note (Signed)
Labs followed with PCP.  Due for follow-up labs soon as it was March 2023 when labs are checked.  Interestingly chemistry panel was checked but not lipids.  He remains on stable dose of rosuvastatin  Would recommend lab check at next PCP visit.

## 2022-10-09 NOTE — Assessment & Plan Note (Signed)
Lymphedema pump, support stockings, foot elevation

## 2022-10-09 NOTE — Assessment & Plan Note (Signed)
Difficult because not very active.  Needs dietary adjustment but I wonder if she would potentially be a candidate for GLP-1 agonist.  Defer to PCP.

## 2022-10-12 DIAGNOSIS — G40309 Generalized idiopathic epilepsy and epileptic syndromes, not intractable, without status epilepticus: Secondary | ICD-10-CM | POA: Diagnosis not present

## 2022-10-12 DIAGNOSIS — S71102D Unspecified open wound, left thigh, subsequent encounter: Secondary | ICD-10-CM | POA: Diagnosis not present

## 2022-10-12 DIAGNOSIS — N182 Chronic kidney disease, stage 2 (mild): Secondary | ICD-10-CM | POA: Diagnosis not present

## 2022-10-12 DIAGNOSIS — J4520 Mild intermittent asthma, uncomplicated: Secondary | ICD-10-CM | POA: Diagnosis not present

## 2022-10-12 DIAGNOSIS — D631 Anemia in chronic kidney disease: Secondary | ICD-10-CM | POA: Diagnosis not present

## 2022-10-12 DIAGNOSIS — I129 Hypertensive chronic kidney disease with stage 1 through stage 4 chronic kidney disease, or unspecified chronic kidney disease: Secondary | ICD-10-CM | POA: Diagnosis not present

## 2022-10-12 DIAGNOSIS — I890 Lymphedema, not elsewhere classified: Secondary | ICD-10-CM | POA: Diagnosis not present

## 2022-10-12 DIAGNOSIS — I872 Venous insufficiency (chronic) (peripheral): Secondary | ICD-10-CM | POA: Diagnosis not present

## 2022-10-12 DIAGNOSIS — E538 Deficiency of other specified B group vitamins: Secondary | ICD-10-CM | POA: Diagnosis not present

## 2022-10-15 DIAGNOSIS — G40309 Generalized idiopathic epilepsy and epileptic syndromes, not intractable, without status epilepticus: Secondary | ICD-10-CM | POA: Diagnosis not present

## 2022-10-15 DIAGNOSIS — N182 Chronic kidney disease, stage 2 (mild): Secondary | ICD-10-CM | POA: Diagnosis not present

## 2022-10-15 DIAGNOSIS — S71102D Unspecified open wound, left thigh, subsequent encounter: Secondary | ICD-10-CM | POA: Diagnosis not present

## 2022-10-15 DIAGNOSIS — J4520 Mild intermittent asthma, uncomplicated: Secondary | ICD-10-CM | POA: Diagnosis not present

## 2022-10-15 DIAGNOSIS — E538 Deficiency of other specified B group vitamins: Secondary | ICD-10-CM | POA: Diagnosis not present

## 2022-10-15 DIAGNOSIS — I872 Venous insufficiency (chronic) (peripheral): Secondary | ICD-10-CM | POA: Diagnosis not present

## 2022-10-15 DIAGNOSIS — I890 Lymphedema, not elsewhere classified: Secondary | ICD-10-CM | POA: Diagnosis not present

## 2022-10-15 DIAGNOSIS — I129 Hypertensive chronic kidney disease with stage 1 through stage 4 chronic kidney disease, or unspecified chronic kidney disease: Secondary | ICD-10-CM | POA: Diagnosis not present

## 2022-10-15 DIAGNOSIS — D631 Anemia in chronic kidney disease: Secondary | ICD-10-CM | POA: Diagnosis not present

## 2022-10-16 DIAGNOSIS — S71102D Unspecified open wound, left thigh, subsequent encounter: Secondary | ICD-10-CM | POA: Diagnosis not present

## 2022-10-16 DIAGNOSIS — I890 Lymphedema, not elsewhere classified: Secondary | ICD-10-CM | POA: Diagnosis not present

## 2022-10-16 DIAGNOSIS — J4520 Mild intermittent asthma, uncomplicated: Secondary | ICD-10-CM | POA: Diagnosis not present

## 2022-10-16 DIAGNOSIS — N182 Chronic kidney disease, stage 2 (mild): Secondary | ICD-10-CM | POA: Diagnosis not present

## 2022-10-16 DIAGNOSIS — E538 Deficiency of other specified B group vitamins: Secondary | ICD-10-CM | POA: Diagnosis not present

## 2022-10-16 DIAGNOSIS — G40309 Generalized idiopathic epilepsy and epileptic syndromes, not intractable, without status epilepticus: Secondary | ICD-10-CM | POA: Diagnosis not present

## 2022-10-16 DIAGNOSIS — I129 Hypertensive chronic kidney disease with stage 1 through stage 4 chronic kidney disease, or unspecified chronic kidney disease: Secondary | ICD-10-CM | POA: Diagnosis not present

## 2022-10-16 DIAGNOSIS — D631 Anemia in chronic kidney disease: Secondary | ICD-10-CM | POA: Diagnosis not present

## 2022-10-16 DIAGNOSIS — I872 Venous insufficiency (chronic) (peripheral): Secondary | ICD-10-CM | POA: Diagnosis not present

## 2022-10-19 DIAGNOSIS — I890 Lymphedema, not elsewhere classified: Secondary | ICD-10-CM | POA: Diagnosis not present

## 2022-10-19 DIAGNOSIS — S71102D Unspecified open wound, left thigh, subsequent encounter: Secondary | ICD-10-CM | POA: Diagnosis not present

## 2022-10-19 DIAGNOSIS — G40309 Generalized idiopathic epilepsy and epileptic syndromes, not intractable, without status epilepticus: Secondary | ICD-10-CM | POA: Diagnosis not present

## 2022-10-19 DIAGNOSIS — I129 Hypertensive chronic kidney disease with stage 1 through stage 4 chronic kidney disease, or unspecified chronic kidney disease: Secondary | ICD-10-CM | POA: Diagnosis not present

## 2022-10-19 DIAGNOSIS — E538 Deficiency of other specified B group vitamins: Secondary | ICD-10-CM | POA: Diagnosis not present

## 2022-10-19 DIAGNOSIS — I872 Venous insufficiency (chronic) (peripheral): Secondary | ICD-10-CM | POA: Diagnosis not present

## 2022-10-19 DIAGNOSIS — D631 Anemia in chronic kidney disease: Secondary | ICD-10-CM | POA: Diagnosis not present

## 2022-10-19 DIAGNOSIS — J4520 Mild intermittent asthma, uncomplicated: Secondary | ICD-10-CM | POA: Diagnosis not present

## 2022-10-19 DIAGNOSIS — N182 Chronic kidney disease, stage 2 (mild): Secondary | ICD-10-CM | POA: Diagnosis not present

## 2022-10-22 DIAGNOSIS — S71102D Unspecified open wound, left thigh, subsequent encounter: Secondary | ICD-10-CM | POA: Diagnosis not present

## 2022-10-22 DIAGNOSIS — J4520 Mild intermittent asthma, uncomplicated: Secondary | ICD-10-CM | POA: Diagnosis not present

## 2022-10-22 DIAGNOSIS — I872 Venous insufficiency (chronic) (peripheral): Secondary | ICD-10-CM | POA: Diagnosis not present

## 2022-10-22 DIAGNOSIS — G40309 Generalized idiopathic epilepsy and epileptic syndromes, not intractable, without status epilepticus: Secondary | ICD-10-CM | POA: Diagnosis not present

## 2022-10-22 DIAGNOSIS — I129 Hypertensive chronic kidney disease with stage 1 through stage 4 chronic kidney disease, or unspecified chronic kidney disease: Secondary | ICD-10-CM | POA: Diagnosis not present

## 2022-10-22 DIAGNOSIS — N182 Chronic kidney disease, stage 2 (mild): Secondary | ICD-10-CM | POA: Diagnosis not present

## 2022-10-22 DIAGNOSIS — E538 Deficiency of other specified B group vitamins: Secondary | ICD-10-CM | POA: Diagnosis not present

## 2022-10-22 DIAGNOSIS — I890 Lymphedema, not elsewhere classified: Secondary | ICD-10-CM | POA: Diagnosis not present

## 2022-10-22 DIAGNOSIS — D631 Anemia in chronic kidney disease: Secondary | ICD-10-CM | POA: Diagnosis not present

## 2022-10-23 DIAGNOSIS — E538 Deficiency of other specified B group vitamins: Secondary | ICD-10-CM | POA: Diagnosis not present

## 2022-10-23 DIAGNOSIS — I129 Hypertensive chronic kidney disease with stage 1 through stage 4 chronic kidney disease, or unspecified chronic kidney disease: Secondary | ICD-10-CM | POA: Diagnosis not present

## 2022-10-23 DIAGNOSIS — I872 Venous insufficiency (chronic) (peripheral): Secondary | ICD-10-CM | POA: Diagnosis not present

## 2022-10-23 DIAGNOSIS — I890 Lymphedema, not elsewhere classified: Secondary | ICD-10-CM | POA: Diagnosis not present

## 2022-10-23 DIAGNOSIS — J4520 Mild intermittent asthma, uncomplicated: Secondary | ICD-10-CM | POA: Diagnosis not present

## 2022-10-23 DIAGNOSIS — S71102D Unspecified open wound, left thigh, subsequent encounter: Secondary | ICD-10-CM | POA: Diagnosis not present

## 2022-10-23 DIAGNOSIS — D631 Anemia in chronic kidney disease: Secondary | ICD-10-CM | POA: Diagnosis not present

## 2022-10-23 DIAGNOSIS — N182 Chronic kidney disease, stage 2 (mild): Secondary | ICD-10-CM | POA: Diagnosis not present

## 2022-10-23 DIAGNOSIS — G40309 Generalized idiopathic epilepsy and epileptic syndromes, not intractable, without status epilepticus: Secondary | ICD-10-CM | POA: Diagnosis not present

## 2022-10-24 DIAGNOSIS — I872 Venous insufficiency (chronic) (peripheral): Secondary | ICD-10-CM | POA: Diagnosis not present

## 2022-10-24 DIAGNOSIS — M6281 Muscle weakness (generalized): Secondary | ICD-10-CM | POA: Diagnosis not present

## 2022-10-24 DIAGNOSIS — L89892 Pressure ulcer of other site, stage 2: Secondary | ICD-10-CM | POA: Diagnosis not present

## 2022-10-24 DIAGNOSIS — I890 Lymphedema, not elsewhere classified: Secondary | ICD-10-CM | POA: Diagnosis not present

## 2022-10-24 DIAGNOSIS — G40309 Generalized idiopathic epilepsy and epileptic syndromes, not intractable, without status epilepticus: Secondary | ICD-10-CM | POA: Diagnosis not present

## 2022-10-24 DIAGNOSIS — Z9181 History of falling: Secondary | ICD-10-CM | POA: Diagnosis not present

## 2022-10-24 DIAGNOSIS — S71102D Unspecified open wound, left thigh, subsequent encounter: Secondary | ICD-10-CM | POA: Diagnosis not present

## 2022-10-27 DIAGNOSIS — I129 Hypertensive chronic kidney disease with stage 1 through stage 4 chronic kidney disease, or unspecified chronic kidney disease: Secondary | ICD-10-CM | POA: Diagnosis not present

## 2022-10-27 DIAGNOSIS — I872 Venous insufficiency (chronic) (peripheral): Secondary | ICD-10-CM | POA: Diagnosis not present

## 2022-10-27 DIAGNOSIS — N182 Chronic kidney disease, stage 2 (mild): Secondary | ICD-10-CM | POA: Diagnosis not present

## 2022-10-27 DIAGNOSIS — D631 Anemia in chronic kidney disease: Secondary | ICD-10-CM | POA: Diagnosis not present

## 2022-10-27 DIAGNOSIS — G40309 Generalized idiopathic epilepsy and epileptic syndromes, not intractable, without status epilepticus: Secondary | ICD-10-CM | POA: Diagnosis not present

## 2022-10-27 DIAGNOSIS — J4520 Mild intermittent asthma, uncomplicated: Secondary | ICD-10-CM | POA: Diagnosis not present

## 2022-10-27 DIAGNOSIS — S71102D Unspecified open wound, left thigh, subsequent encounter: Secondary | ICD-10-CM | POA: Diagnosis not present

## 2022-10-27 DIAGNOSIS — I890 Lymphedema, not elsewhere classified: Secondary | ICD-10-CM | POA: Diagnosis not present

## 2022-10-27 DIAGNOSIS — E538 Deficiency of other specified B group vitamins: Secondary | ICD-10-CM | POA: Diagnosis not present

## 2022-10-28 DIAGNOSIS — J4520 Mild intermittent asthma, uncomplicated: Secondary | ICD-10-CM | POA: Diagnosis not present

## 2022-10-28 DIAGNOSIS — G40309 Generalized idiopathic epilepsy and epileptic syndromes, not intractable, without status epilepticus: Secondary | ICD-10-CM | POA: Diagnosis not present

## 2022-10-28 DIAGNOSIS — D631 Anemia in chronic kidney disease: Secondary | ICD-10-CM | POA: Diagnosis not present

## 2022-10-28 DIAGNOSIS — E538 Deficiency of other specified B group vitamins: Secondary | ICD-10-CM | POA: Diagnosis not present

## 2022-10-28 DIAGNOSIS — S71102D Unspecified open wound, left thigh, subsequent encounter: Secondary | ICD-10-CM | POA: Diagnosis not present

## 2022-10-28 DIAGNOSIS — I890 Lymphedema, not elsewhere classified: Secondary | ICD-10-CM | POA: Diagnosis not present

## 2022-10-28 DIAGNOSIS — I872 Venous insufficiency (chronic) (peripheral): Secondary | ICD-10-CM | POA: Diagnosis not present

## 2022-10-28 DIAGNOSIS — I129 Hypertensive chronic kidney disease with stage 1 through stage 4 chronic kidney disease, or unspecified chronic kidney disease: Secondary | ICD-10-CM | POA: Diagnosis not present

## 2022-10-28 DIAGNOSIS — N182 Chronic kidney disease, stage 2 (mild): Secondary | ICD-10-CM | POA: Diagnosis not present

## 2022-11-02 DIAGNOSIS — I872 Venous insufficiency (chronic) (peripheral): Secondary | ICD-10-CM | POA: Diagnosis not present

## 2022-11-02 DIAGNOSIS — G40309 Generalized idiopathic epilepsy and epileptic syndromes, not intractable, without status epilepticus: Secondary | ICD-10-CM | POA: Diagnosis not present

## 2022-11-02 DIAGNOSIS — S71102D Unspecified open wound, left thigh, subsequent encounter: Secondary | ICD-10-CM | POA: Diagnosis not present

## 2022-11-02 DIAGNOSIS — I890 Lymphedema, not elsewhere classified: Secondary | ICD-10-CM | POA: Diagnosis not present

## 2022-11-02 DIAGNOSIS — I129 Hypertensive chronic kidney disease with stage 1 through stage 4 chronic kidney disease, or unspecified chronic kidney disease: Secondary | ICD-10-CM | POA: Diagnosis not present

## 2022-11-02 DIAGNOSIS — E538 Deficiency of other specified B group vitamins: Secondary | ICD-10-CM | POA: Diagnosis not present

## 2022-11-02 DIAGNOSIS — D631 Anemia in chronic kidney disease: Secondary | ICD-10-CM | POA: Diagnosis not present

## 2022-11-02 DIAGNOSIS — N182 Chronic kidney disease, stage 2 (mild): Secondary | ICD-10-CM | POA: Diagnosis not present

## 2022-11-02 DIAGNOSIS — J4520 Mild intermittent asthma, uncomplicated: Secondary | ICD-10-CM | POA: Diagnosis not present

## 2022-11-04 ENCOUNTER — Other Ambulatory Visit: Payer: Self-pay | Admitting: Cardiology

## 2022-11-05 DIAGNOSIS — Z79899 Other long term (current) drug therapy: Secondary | ICD-10-CM | POA: Diagnosis not present

## 2022-11-05 DIAGNOSIS — Z5181 Encounter for therapeutic drug level monitoring: Secondary | ICD-10-CM | POA: Diagnosis not present

## 2022-11-05 DIAGNOSIS — M47896 Other spondylosis, lumbar region: Secondary | ICD-10-CM | POA: Diagnosis not present

## 2022-11-05 DIAGNOSIS — L03116 Cellulitis of left lower limb: Secondary | ICD-10-CM | POA: Diagnosis not present

## 2022-11-05 DIAGNOSIS — M1611 Unilateral primary osteoarthritis, right hip: Secondary | ICD-10-CM | POA: Diagnosis not present

## 2022-11-06 DIAGNOSIS — D631 Anemia in chronic kidney disease: Secondary | ICD-10-CM | POA: Diagnosis not present

## 2022-11-06 DIAGNOSIS — J4520 Mild intermittent asthma, uncomplicated: Secondary | ICD-10-CM | POA: Diagnosis not present

## 2022-11-06 DIAGNOSIS — I890 Lymphedema, not elsewhere classified: Secondary | ICD-10-CM | POA: Diagnosis not present

## 2022-11-06 DIAGNOSIS — I872 Venous insufficiency (chronic) (peripheral): Secondary | ICD-10-CM | POA: Diagnosis not present

## 2022-11-06 DIAGNOSIS — N182 Chronic kidney disease, stage 2 (mild): Secondary | ICD-10-CM | POA: Diagnosis not present

## 2022-11-06 DIAGNOSIS — G40309 Generalized idiopathic epilepsy and epileptic syndromes, not intractable, without status epilepticus: Secondary | ICD-10-CM | POA: Diagnosis not present

## 2022-11-06 DIAGNOSIS — E538 Deficiency of other specified B group vitamins: Secondary | ICD-10-CM | POA: Diagnosis not present

## 2022-11-06 DIAGNOSIS — I129 Hypertensive chronic kidney disease with stage 1 through stage 4 chronic kidney disease, or unspecified chronic kidney disease: Secondary | ICD-10-CM | POA: Diagnosis not present

## 2022-11-06 DIAGNOSIS — S71102D Unspecified open wound, left thigh, subsequent encounter: Secondary | ICD-10-CM | POA: Diagnosis not present

## 2022-11-09 DIAGNOSIS — D631 Anemia in chronic kidney disease: Secondary | ICD-10-CM | POA: Diagnosis not present

## 2022-11-09 DIAGNOSIS — N182 Chronic kidney disease, stage 2 (mild): Secondary | ICD-10-CM | POA: Diagnosis not present

## 2022-11-09 DIAGNOSIS — E538 Deficiency of other specified B group vitamins: Secondary | ICD-10-CM | POA: Diagnosis not present

## 2022-11-09 DIAGNOSIS — J4520 Mild intermittent asthma, uncomplicated: Secondary | ICD-10-CM | POA: Diagnosis not present

## 2022-11-09 DIAGNOSIS — G40309 Generalized idiopathic epilepsy and epileptic syndromes, not intractable, without status epilepticus: Secondary | ICD-10-CM | POA: Diagnosis not present

## 2022-11-09 DIAGNOSIS — I129 Hypertensive chronic kidney disease with stage 1 through stage 4 chronic kidney disease, or unspecified chronic kidney disease: Secondary | ICD-10-CM | POA: Diagnosis not present

## 2022-11-09 DIAGNOSIS — I890 Lymphedema, not elsewhere classified: Secondary | ICD-10-CM | POA: Diagnosis not present

## 2022-11-09 DIAGNOSIS — I872 Venous insufficiency (chronic) (peripheral): Secondary | ICD-10-CM | POA: Diagnosis not present

## 2022-11-09 DIAGNOSIS — S71102D Unspecified open wound, left thigh, subsequent encounter: Secondary | ICD-10-CM | POA: Diagnosis not present

## 2022-11-10 DIAGNOSIS — E538 Deficiency of other specified B group vitamins: Secondary | ICD-10-CM | POA: Diagnosis not present

## 2022-11-10 DIAGNOSIS — N182 Chronic kidney disease, stage 2 (mild): Secondary | ICD-10-CM | POA: Diagnosis not present

## 2022-11-10 DIAGNOSIS — S71102D Unspecified open wound, left thigh, subsequent encounter: Secondary | ICD-10-CM | POA: Diagnosis not present

## 2022-11-10 DIAGNOSIS — I872 Venous insufficiency (chronic) (peripheral): Secondary | ICD-10-CM | POA: Diagnosis not present

## 2022-11-10 DIAGNOSIS — I890 Lymphedema, not elsewhere classified: Secondary | ICD-10-CM | POA: Diagnosis not present

## 2022-11-10 DIAGNOSIS — J4520 Mild intermittent asthma, uncomplicated: Secondary | ICD-10-CM | POA: Diagnosis not present

## 2022-11-10 DIAGNOSIS — D631 Anemia in chronic kidney disease: Secondary | ICD-10-CM | POA: Diagnosis not present

## 2022-11-10 DIAGNOSIS — G40309 Generalized idiopathic epilepsy and epileptic syndromes, not intractable, without status epilepticus: Secondary | ICD-10-CM | POA: Diagnosis not present

## 2022-11-10 DIAGNOSIS — I129 Hypertensive chronic kidney disease with stage 1 through stage 4 chronic kidney disease, or unspecified chronic kidney disease: Secondary | ICD-10-CM | POA: Diagnosis not present

## 2022-11-16 DIAGNOSIS — S71102D Unspecified open wound, left thigh, subsequent encounter: Secondary | ICD-10-CM | POA: Diagnosis not present

## 2022-11-16 DIAGNOSIS — I890 Lymphedema, not elsewhere classified: Secondary | ICD-10-CM | POA: Diagnosis not present

## 2022-11-16 DIAGNOSIS — N182 Chronic kidney disease, stage 2 (mild): Secondary | ICD-10-CM | POA: Diagnosis not present

## 2022-11-16 DIAGNOSIS — I129 Hypertensive chronic kidney disease with stage 1 through stage 4 chronic kidney disease, or unspecified chronic kidney disease: Secondary | ICD-10-CM | POA: Diagnosis not present

## 2022-11-16 DIAGNOSIS — D631 Anemia in chronic kidney disease: Secondary | ICD-10-CM | POA: Diagnosis not present

## 2022-11-16 DIAGNOSIS — G40309 Generalized idiopathic epilepsy and epileptic syndromes, not intractable, without status epilepticus: Secondary | ICD-10-CM | POA: Diagnosis not present

## 2022-11-16 DIAGNOSIS — E538 Deficiency of other specified B group vitamins: Secondary | ICD-10-CM | POA: Diagnosis not present

## 2022-11-16 DIAGNOSIS — J4520 Mild intermittent asthma, uncomplicated: Secondary | ICD-10-CM | POA: Diagnosis not present

## 2022-11-16 DIAGNOSIS — I872 Venous insufficiency (chronic) (peripheral): Secondary | ICD-10-CM | POA: Diagnosis not present

## 2022-11-17 DIAGNOSIS — J4520 Mild intermittent asthma, uncomplicated: Secondary | ICD-10-CM | POA: Diagnosis not present

## 2022-11-17 DIAGNOSIS — G40309 Generalized idiopathic epilepsy and epileptic syndromes, not intractable, without status epilepticus: Secondary | ICD-10-CM | POA: Diagnosis not present

## 2022-11-17 DIAGNOSIS — I890 Lymphedema, not elsewhere classified: Secondary | ICD-10-CM | POA: Diagnosis not present

## 2022-11-17 DIAGNOSIS — I129 Hypertensive chronic kidney disease with stage 1 through stage 4 chronic kidney disease, or unspecified chronic kidney disease: Secondary | ICD-10-CM | POA: Diagnosis not present

## 2022-11-17 DIAGNOSIS — N182 Chronic kidney disease, stage 2 (mild): Secondary | ICD-10-CM | POA: Diagnosis not present

## 2022-11-17 DIAGNOSIS — D631 Anemia in chronic kidney disease: Secondary | ICD-10-CM | POA: Diagnosis not present

## 2022-11-17 DIAGNOSIS — I872 Venous insufficiency (chronic) (peripheral): Secondary | ICD-10-CM | POA: Diagnosis not present

## 2022-11-17 DIAGNOSIS — E538 Deficiency of other specified B group vitamins: Secondary | ICD-10-CM | POA: Diagnosis not present

## 2022-11-17 DIAGNOSIS — S71102D Unspecified open wound, left thigh, subsequent encounter: Secondary | ICD-10-CM | POA: Diagnosis not present

## 2022-11-23 DIAGNOSIS — I872 Venous insufficiency (chronic) (peripheral): Secondary | ICD-10-CM | POA: Diagnosis not present

## 2022-11-23 DIAGNOSIS — I890 Lymphedema, not elsewhere classified: Secondary | ICD-10-CM | POA: Diagnosis not present

## 2022-11-23 DIAGNOSIS — M6281 Muscle weakness (generalized): Secondary | ICD-10-CM | POA: Diagnosis not present

## 2022-11-23 DIAGNOSIS — L89892 Pressure ulcer of other site, stage 2: Secondary | ICD-10-CM | POA: Diagnosis not present

## 2022-11-23 DIAGNOSIS — G40309 Generalized idiopathic epilepsy and epileptic syndromes, not intractable, without status epilepticus: Secondary | ICD-10-CM | POA: Diagnosis not present

## 2022-11-23 DIAGNOSIS — Z9181 History of falling: Secondary | ICD-10-CM | POA: Diagnosis not present

## 2022-11-23 DIAGNOSIS — S71102D Unspecified open wound, left thigh, subsequent encounter: Secondary | ICD-10-CM | POA: Diagnosis not present

## 2022-12-18 ENCOUNTER — Other Ambulatory Visit (HOSPITAL_COMMUNITY): Payer: Self-pay | Admitting: Family Medicine

## 2022-12-18 ENCOUNTER — Ambulatory Visit (HOSPITAL_COMMUNITY)
Admission: RE | Admit: 2022-12-18 | Discharge: 2022-12-18 | Disposition: A | Discharge status: Home / Self Care | Payer: Medicare PPO | Source: Ambulatory Visit | Attending: Family Medicine | Admitting: Family Medicine

## 2022-12-18 DIAGNOSIS — L8990 Pressure ulcer of unspecified site, unspecified stage: Secondary | ICD-10-CM | POA: Diagnosis not present

## 2022-12-18 DIAGNOSIS — Z6841 Body Mass Index (BMI) 40.0 and over, adult: Secondary | ICD-10-CM | POA: Diagnosis not present

## 2022-12-18 DIAGNOSIS — Z993 Dependence on wheelchair: Secondary | ICD-10-CM | POA: Diagnosis not present

## 2022-12-18 DIAGNOSIS — L03116 Cellulitis of left lower limb: Secondary | ICD-10-CM

## 2022-12-18 NOTE — Progress Notes (Signed)
Lower extremity venous duplex completed. Please see CV Procedures for preliminary results.  Shona Simpson, RVT 12/18/22 3:59 PM

## 2022-12-21 ENCOUNTER — Other Ambulatory Visit: Payer: Self-pay

## 2022-12-21 ENCOUNTER — Encounter (HOSPITAL_COMMUNITY): Payer: Self-pay | Admitting: Internal Medicine

## 2022-12-21 ENCOUNTER — Emergency Department (HOSPITAL_BASED_OUTPATIENT_CLINIC_OR_DEPARTMENT_OTHER): Payer: Medicare PPO

## 2022-12-21 ENCOUNTER — Inpatient Hospital Stay (HOSPITAL_BASED_OUTPATIENT_CLINIC_OR_DEPARTMENT_OTHER)
Admission: EM | Admit: 2022-12-21 | Discharge: 2022-12-24 | DRG: 603 | Disposition: A | Discharge status: Home / Self Care | Payer: Medicare PPO | Attending: Internal Medicine | Admitting: Internal Medicine

## 2022-12-21 DIAGNOSIS — M85861 Other specified disorders of bone density and structure, right lower leg: Secondary | ICD-10-CM | POA: Diagnosis not present

## 2022-12-21 DIAGNOSIS — Z6841 Body Mass Index (BMI) 40.0 and over, adult: Secondary | ICD-10-CM | POA: Diagnosis not present

## 2022-12-21 DIAGNOSIS — I872 Venous insufficiency (chronic) (peripheral): Secondary | ICD-10-CM | POA: Diagnosis not present

## 2022-12-21 DIAGNOSIS — L03115 Cellulitis of right lower limb: Secondary | ICD-10-CM | POA: Diagnosis present

## 2022-12-21 DIAGNOSIS — Z8049 Family history of malignant neoplasm of other genital organs: Secondary | ICD-10-CM

## 2022-12-21 DIAGNOSIS — Z885 Allergy status to narcotic agent status: Secondary | ICD-10-CM

## 2022-12-21 DIAGNOSIS — G40909 Epilepsy, unspecified, not intractable, without status epilepticus: Secondary | ICD-10-CM | POA: Diagnosis present

## 2022-12-21 DIAGNOSIS — I10 Essential (primary) hypertension: Secondary | ICD-10-CM | POA: Diagnosis present

## 2022-12-21 DIAGNOSIS — Z801 Family history of malignant neoplasm of trachea, bronchus and lung: Secondary | ICD-10-CM

## 2022-12-21 DIAGNOSIS — Z888 Allergy status to other drugs, medicaments and biological substances status: Secondary | ICD-10-CM | POA: Diagnosis not present

## 2022-12-21 DIAGNOSIS — L89892 Pressure ulcer of other site, stage 2: Secondary | ICD-10-CM | POA: Diagnosis present

## 2022-12-21 DIAGNOSIS — Z79899 Other long term (current) drug therapy: Secondary | ICD-10-CM

## 2022-12-21 DIAGNOSIS — Z884 Allergy status to anesthetic agent status: Secondary | ICD-10-CM | POA: Diagnosis not present

## 2022-12-21 DIAGNOSIS — K219 Gastro-esophageal reflux disease without esophagitis: Secondary | ICD-10-CM | POA: Diagnosis present

## 2022-12-21 DIAGNOSIS — R0989 Other specified symptoms and signs involving the circulatory and respiratory systems: Secondary | ICD-10-CM | POA: Diagnosis not present

## 2022-12-21 DIAGNOSIS — L0390 Cellulitis, unspecified: Secondary | ICD-10-CM | POA: Diagnosis present

## 2022-12-21 DIAGNOSIS — R600 Localized edema: Secondary | ICD-10-CM | POA: Diagnosis not present

## 2022-12-21 DIAGNOSIS — R059 Cough, unspecified: Secondary | ICD-10-CM | POA: Diagnosis not present

## 2022-12-21 DIAGNOSIS — M85862 Other specified disorders of bone density and structure, left lower leg: Secondary | ICD-10-CM | POA: Diagnosis not present

## 2022-12-21 DIAGNOSIS — Z820 Family history of epilepsy and other diseases of the nervous system: Secondary | ICD-10-CM

## 2022-12-21 DIAGNOSIS — D649 Anemia, unspecified: Secondary | ICD-10-CM | POA: Diagnosis present

## 2022-12-21 DIAGNOSIS — K589 Irritable bowel syndrome without diarrhea: Secondary | ICD-10-CM | POA: Diagnosis present

## 2022-12-21 DIAGNOSIS — M7989 Other specified soft tissue disorders: Secondary | ICD-10-CM | POA: Diagnosis present

## 2022-12-21 DIAGNOSIS — J45909 Unspecified asthma, uncomplicated: Secondary | ICD-10-CM | POA: Diagnosis present

## 2022-12-21 DIAGNOSIS — Z90710 Acquired absence of both cervix and uterus: Secondary | ICD-10-CM

## 2022-12-21 DIAGNOSIS — L03116 Cellulitis of left lower limb: Principal | ICD-10-CM | POA: Diagnosis present

## 2022-12-21 DIAGNOSIS — L02419 Cutaneous abscess of limb, unspecified: Secondary | ICD-10-CM | POA: Diagnosis present

## 2022-12-21 DIAGNOSIS — Z91048 Other nonmedicinal substance allergy status: Secondary | ICD-10-CM | POA: Diagnosis not present

## 2022-12-21 DIAGNOSIS — Z7951 Long term (current) use of inhaled steroids: Secondary | ICD-10-CM

## 2022-12-21 DIAGNOSIS — L03119 Cellulitis of unspecified part of limb: Secondary | ICD-10-CM | POA: Diagnosis not present

## 2022-12-21 DIAGNOSIS — Z9181 History of falling: Secondary | ICD-10-CM | POA: Diagnosis not present

## 2022-12-21 DIAGNOSIS — I890 Lymphedema, not elsewhere classified: Secondary | ICD-10-CM | POA: Diagnosis present

## 2022-12-21 DIAGNOSIS — G2581 Restless legs syndrome: Secondary | ICD-10-CM | POA: Diagnosis present

## 2022-12-21 DIAGNOSIS — E7800 Pure hypercholesterolemia, unspecified: Secondary | ICD-10-CM | POA: Diagnosis present

## 2022-12-21 DIAGNOSIS — Z8669 Personal history of other diseases of the nervous system and sense organs: Secondary | ICD-10-CM

## 2022-12-21 DIAGNOSIS — M6281 Muscle weakness (generalized): Secondary | ICD-10-CM | POA: Diagnosis not present

## 2022-12-21 DIAGNOSIS — Z91041 Radiographic dye allergy status: Secondary | ICD-10-CM

## 2022-12-21 DIAGNOSIS — Z1152 Encounter for screening for COVID-19: Secondary | ICD-10-CM | POA: Diagnosis not present

## 2022-12-21 DIAGNOSIS — G4733 Obstructive sleep apnea (adult) (pediatric): Secondary | ICD-10-CM | POA: Diagnosis present

## 2022-12-21 DIAGNOSIS — Z7982 Long term (current) use of aspirin: Secondary | ICD-10-CM | POA: Diagnosis not present

## 2022-12-21 DIAGNOSIS — S71102D Unspecified open wound, left thigh, subsequent encounter: Secondary | ICD-10-CM | POA: Diagnosis not present

## 2022-12-21 DIAGNOSIS — R509 Fever, unspecified: Secondary | ICD-10-CM | POA: Diagnosis not present

## 2022-12-21 DIAGNOSIS — Z87891 Personal history of nicotine dependence: Secondary | ICD-10-CM

## 2022-12-21 DIAGNOSIS — Z981 Arthrodesis status: Secondary | ICD-10-CM

## 2022-12-21 DIAGNOSIS — G40309 Generalized idiopathic epilepsy and epileptic syndromes, not intractable, without status epilepticus: Secondary | ICD-10-CM | POA: Diagnosis not present

## 2022-12-21 LAB — CBC WITH DIFFERENTIAL/PLATELET
Abs Immature Granulocytes: 0 10*3/uL (ref 0.00–0.07)
Basophils Absolute: 0 10*3/uL (ref 0.0–0.1)
Basophils Relative: 1 %
Eosinophils Absolute: 0.2 10*3/uL (ref 0.0–0.5)
Eosinophils Relative: 3 %
HCT: 35.5 % — ABNORMAL LOW (ref 36.0–46.0)
Hemoglobin: 11.9 g/dL — ABNORMAL LOW (ref 12.0–15.0)
Immature Granulocytes: 0 %
Lymphocytes Relative: 29 %
Lymphs Abs: 1.9 10*3/uL (ref 0.7–4.0)
MCH: 29.4 pg (ref 26.0–34.0)
MCHC: 33.5 g/dL (ref 30.0–36.0)
MCV: 87.7 fL (ref 80.0–100.0)
Monocytes Absolute: 0.6 10*3/uL (ref 0.1–1.0)
Monocytes Relative: 8 %
Neutro Abs: 4 10*3/uL (ref 1.7–7.7)
Neutrophils Relative %: 59 %
Platelets: 181 10*3/uL (ref 150–400)
RBC: 4.05 MIL/uL (ref 3.87–5.11)
RDW: 13.7 % (ref 11.5–15.5)
WBC: 6.6 10*3/uL (ref 4.0–10.5)
nRBC: 0 % (ref 0.0–0.2)

## 2022-12-21 LAB — BASIC METABOLIC PANEL
Anion gap: 8 (ref 5–15)
BUN: 9 mg/dL (ref 8–23)
CO2: 27 mmol/L (ref 22–32)
Calcium: 10 mg/dL (ref 8.9–10.3)
Chloride: 101 mmol/L (ref 98–111)
Creatinine, Ser: 0.81 mg/dL (ref 0.44–1.00)
GFR, Estimated: >60 mL/min (ref >60)
Glucose, Bld: 95 mg/dL (ref 70–99)
Potassium: 4 mmol/L (ref 3.5–5.1)
Sodium: 136 mmol/L (ref 135–145)

## 2022-12-21 LAB — LACTIC ACID, PLASMA
Lactic Acid, Venous: 1 mmol/L (ref 0.5–1.9)
Lactic Acid, Venous: 1 mmol/L (ref 0.5–1.9)

## 2022-12-21 MED ORDER — IPRATROPIUM BROMIDE 0.02 % IN SOLN: 0.5000 mg | Freq: Four times a day (QID) | RESPIRATORY_TRACT | Status: DC | PRN

## 2022-12-21 MED ORDER — BISACODYL 5 MG PO TBEC: 5.0000 mg | DELAYED_RELEASE_TABLET | Freq: Every day | ORAL | Status: DC | PRN

## 2022-12-21 MED ORDER — ACETAMINOPHEN 650 MG RE SUPP: 650.0000 mg | Freq: Four times a day (QID) | RECTAL | Status: DC | PRN

## 2022-12-21 MED ORDER — ENOXAPARIN SODIUM 60 MG/0.6ML IJ SOSY
60.0000 mg | PREFILLED_SYRINGE | INTRAMUSCULAR | Status: DC
  Administered 2022-12-22 – 2022-12-24 (×3): 60 mg via SUBCUTANEOUS
  Filled 2022-12-21 (×4): qty 0.6

## 2022-12-21 MED ORDER — ALBUTEROL SULFATE (2.5 MG/3ML) 0.083% IN NEBU: 2.5000 mg | INHALATION_SOLUTION | Freq: Four times a day (QID) | RESPIRATORY_TRACT | Status: DC | PRN

## 2022-12-21 MED ORDER — SODIUM CHLORIDE 0.9% FLUSH
3.0000 mL | Freq: Two times a day (BID) | INTRAVENOUS | Status: DC
  Administered 2022-12-21 – 2022-12-24 (×5): 3 mL via INTRAVENOUS

## 2022-12-21 MED ORDER — SENNOSIDES-DOCUSATE SODIUM 8.6-50 MG PO TABS
1.0000 | ORAL_TABLET | Freq: Every evening | ORAL | Status: DC | PRN
  Filled 2022-12-21 (×2): qty 1

## 2022-12-21 MED ORDER — SODIUM CHLORIDE 0.9 % IV SOLN
1.0000 g | INTRAVENOUS | Status: DC
  Filled 2022-12-21: qty 10

## 2022-12-21 MED ORDER — TRAZODONE HCL 50 MG PO TABS
25.0000 mg | ORAL_TABLET | Freq: Every evening | ORAL | Status: DC | PRN
  Administered 2022-12-22 (×2): 25 mg via ORAL
  Filled 2022-12-21 (×2): qty 1

## 2022-12-21 MED ORDER — ONDANSETRON HCL 4 MG PO TABS: 4.0000 mg | ORAL_TABLET | Freq: Four times a day (QID) | ORAL | Status: DC | PRN

## 2022-12-21 MED ORDER — ACETAMINOPHEN 325 MG PO TABS
650.0000 mg | ORAL_TABLET | Freq: Four times a day (QID) | ORAL | Status: DC | PRN
  Administered 2022-12-22 – 2022-12-24 (×5): 650 mg via ORAL
  Filled 2022-12-21 (×7): qty 2

## 2022-12-21 MED ORDER — ONDANSETRON HCL 4 MG/2ML IJ SOLN: 4.0000 mg | Freq: Four times a day (QID) | INTRAMUSCULAR | Status: DC | PRN

## 2022-12-21 MED ORDER — HYDRALAZINE HCL 20 MG/ML IJ SOLN: 5.0000 mg | Freq: Four times a day (QID) | INTRAMUSCULAR | Status: DC | PRN

## 2022-12-21 MED ORDER — SODIUM CHLORIDE 0.9 % IV SOLN
2.0000 g | Freq: Once | INTRAVENOUS | Status: AC
  Administered 2022-12-21: 2 g via INTRAVENOUS
  Filled 2022-12-21: qty 20

## 2022-12-21 NOTE — ED Provider Notes (Signed)
[place] [facility]MENT AT [facility] Provider Note   CSN: [id number] Arrival date & time: 12/21/22  1222     History  Chief Complaint  Patient presents with   Wound Infection    [name] is a 72 y.o. female with PMHx asthma, arthritis, GERD, HLD, HTN, IBS, lymphadenopathy who presents to ED concerned for cellulitis failing outpatient treatment. Patient with fever of 100.64F on Friday when she visited PCP. Patient diagnosed with cellulitis and started patient on amoxicillin. Patient has been compliant on ABX. Patient stating that cellulitis has been getting worse. DVT US yesterday without concern for blood clot or cystic structures.   Denies chest pain, dyspnea, abdominal pain, nausea, vomiting.  HPI     Home Medications Prior to Admission medications   Medication Sig Start Date End Date Taking? Authorizing Provider  acetaminophen (TYLENOL) 325 MG tablet Take 2 tablets (650 mg total) by mouth every 6 (six) hours as needed for mild pain or headache. 01/15/21   Ghimire, Werner Lean, MD  albuterol (VENTOLIN HFA) 108 (90 Base) MCG/ACT inhaler Inhale 2 puffs into the lungs every 6 (six) hours as needed.    [provider]  amLODipine (NORVASC) 5 MG tablet TAKE 1 TABLET (5 MG TOTAL) BY MOUTH DAILY. 03/24/22   Marykay Lex, MD  aspirin EC 81 MG tablet Take 1 tablet (81 mg total) by mouth 2 (two) times daily. 03/13/16   Jacinta Shoe, PA-C  Calcium Carbonate-Vitamin D (CALCIUM 600+D PO) Take 1 tablet by mouth daily.     [provider]  cholecalciferol (VITAMIN D) 1000 units tablet Take 3,000 Units by mouth daily.     [provider]  Cyanocobalamin (B-12 PO) Take by mouth.    [provider]  ferrous sulfate 325 (65 FE) MG tablet Take 1 tablet (325 mg total) by mouth daily with breakfast. 03/11/21   Lewie Chamber, MD  fluticasone (FLONASE) 50 MCG/ACT nasal spray Place 2 sprays into the nose 2 (two) times daily.    [provider]  guaiFENesin (ROBITUSSIN) 100 MG/5ML liquid Take 10 mLs by mouth every 6 (six) hours as needed for cough or to loosen phlegm.    [provider]  hydrOXYzine (ATARAX/VISTARIL) 25 MG tablet Take 1 tablet (25 mg total) by mouth daily as needed for itching or anxiety. 02/01/21   Joseph Art, DO  levocetirizine (XYZAL) 5 MG tablet Take 5 mg by mouth daily.    [provider]  magnesium oxide (MAG-OX) 400 MG tablet Take 2 tablets (800 mg total) by mouth at bedtime. 02/01/21   Joseph Art, DO  metolazone (ZAROXOLYN) 5 MG tablet Take 1 tablet (5 mg total) by mouth daily. 03/30/22   Marykay Lex, MD  montelukast (SINGULAIR) 10 MG tablet Take 10 mg by mouth at bedtime.    [provider]  nystatin (MYCOSTATIN/NYSTOP) powder Apply 1 application  topically 2 (two) times daily. Underneath breast and groin    [provider]  pantoprazole (PROTONIX) 40 MG tablet Take 1 tablet by mouth daily. 04/01/21   [provider]  PHENobarbital (LUMINAL) 32.4 MG tablet TAKE 1 TABLET (32.4 MG TOTAL) BY MOUTH AT BEDTIME. 09/09/22   Sater, Pearletha Furl, MD  potassium chloride SA (KLOR-CON) 20 MEQ tablet Take 1 tablet (20 mEq total) by mouth daily. 02/12/21   Regalado, Belkys A, MD  pramipexole (MIRAPEX) 0.25 MG tablet TAKE 1 TO 2 TABLETS AT 6 PM FOR RESTLESS LEG SYNDROME 09/07/22  Dohmeier, Porfirio Mylar, MD  Pramipexole Dihydrochloride 0.75 MG TB24 TAKE 1 TABLET (0.75 MG TOTAL) BY MOUTH AT BEDTIME. 09/07/22   Dohmeier, Porfirio Mylar, MD  rosuvastatin (CRESTOR) 20 MG tablet TAKE 1 TABLET (20 MG TOTAL) BY MOUTH DAILY. NEEDS APPOINTMENT FOR FUTURE REFILLS 04/03/19   Marykay Lex, MD  spironolactone (ALDACTONE) 50 MG tablet TAKE 1 TABLET (50 MG TOTAL) BY MOUTH DAILY. **KEEP APPOINTMENT WITH DR. HARDING FOR FUTURE REFILL** 11/04/22   Marykay Lex, MD  Suvorexant (BELSOMRA) 10 MG TABS Take 10 mg by mouth at bedtime as needed. 08/04/21   Dohmeier, Porfirio Mylar, MD  torsemide (DEMADEX)  20 MG tablet Take 20 mg by mouth daily. 06/12/19   [provider]  traMADol (ULTRAM) 50 MG tablet Take 50 mg by mouth 3 (three) times daily as needed for moderate pain. 06/18/22   [provider]  valsartan (DIOVAN) 80 MG tablet Take 1 tablet (80 mg total) by mouth daily. 03/24/22   Marykay Lex, MD  Vitamin A 2400 MCG (8000 UT) CAPS Take 1 capsule by mouth daily.    [provider]  vitamin B-12 (CYANOCOBALAMIN) 1000 MCG tablet Take 1 tablet by mouth daily. 04/07/21   [provider]      Allergies    Compazine, Prochlorperazine maleate, Topamax [topiramate], Codeine sulfate, Other, Seasonal ic [cholestatin], Adhesive [tape], Iodinated contrast media, and Pedi-pre tape spray [wound dressing adhesive]    Review of Systems   Review of Systems  Skin:  Positive for rash.    Physical Exam Updated Vital Signs BP (!) 137/48 (BP Location: Left Arm)   Pulse 73   Temp 98.7 F (37.1 C) (Oral)   Resp 16   Ht 5\' 2"  (1.575 m)   Wt 117.9 kg   SpO2 99%   BMI 47.55 kg/m  Physical Exam Vitals and nursing note reviewed.  Constitutional:      General: She is not in acute distress.    Appearance: She is not ill-appearing or toxic-appearing.  HENT:     Head: Normocephalic and atraumatic.     Mouth/Throat:     Mouth: Mucous membranes are moist.  Eyes:     General: No scleral icterus.       Right eye: No discharge.        Left eye: No discharge.     Conjunctiva/sclera: Conjunctivae normal.  Cardiovascular:     Rate and Rhythm: Normal rate and regular rhythm.     Pulses: Normal pulses.     Heart sounds: Normal heart sounds. No murmur heard. Pulmonary:     Effort: Pulmonary effort is normal. No respiratory distress.     Breath sounds: Normal breath sounds. No wheezing, rhonchi or rales.  Abdominal:     General: Abdomen is flat. Bowel sounds are normal.     Palpations: Abdomen is soft.     Tenderness: There is no abdominal tenderness.  Musculoskeletal:      Right lower leg: No edema.     Left lower leg: No edema.  Skin:    General: Skin is warm and dry.     Comments: +3 pitting edema of LE bilaterally. Erythema and warmth of bilateral LE that seems more extensive on left leg with erythema spreading up to left knee.   Neurological:     General: No focal deficit present.     Mental Status: She is alert. Mental status is at baseline.  Psychiatric:        Mood and Affect: Mood normal.  ED Results / Procedures / Treatments   Labs (all labs ordered are listed, but only abnormal results are displayed) Labs Reviewed  CBC WITH DIFFERENTIAL/PLATELET - Abnormal; Notable for the following components:      Result Value   Hemoglobin 11.9 (*)    HCT 35.5 (*)    All other components within normal limits  CULTURE, BLOOD (ROUTINE X 2)  CULTURE, BLOOD (ROUTINE X 2)  BASIC METABOLIC PANEL  LACTIC ACID, PLASMA  LACTIC ACID, PLASMA    EKG None  Radiology No results found.  Procedures Procedures    Medications Ordered in ED Medications  cefTRIAXone (ROCEPHIN) 2 g in sodium chloride 0.9 % 100 mL IVPB (2 g Intravenous New Bag/Given 12/21/22 1700)    ED Course/ Medical Decision Making/ A&P                                 Medical Decision Making Amount and/or Complexity of Data Reviewed Labs: ordered.   This patient presents to the ED for concern of cellulitis, this involves an extensive number of treatment options, and is a complaint that carries with it a high risk of complications and morbidity.  The differential diagnosis includes irritant contact dermatitis, DRESS, atopic dermatitis, anaphylaxis, SJS/TEN   Co morbidities that complicate the patient evaluation  asthma, arthritis, GERD, HLD, HTN, IBS, lymphadenopathy    Lab Tests:  I Ordered, and personally interpreted labs.  The pertinent results include:  -BMP: no concern for electrolyte abnormality; no concern for kidney/liver damage - CBC: mild anemia, no  leukocytosis -LA: within normal limits - Blood cultures: pending   Imaging Studies ordered:  I ordered imaging studies including  -right and left tibia/fibula xray: to assess for complications given patient's skin infection I independently visualized and interpreted imaging  I agree with the radiologist interpretation     Problem List / ED Course / Critical interventions / Medication management  Admitting patient for cellulitis failing outpatient management. Patient with fever and concerns for cellulitis 3 days ago. Seen by PCP and prescribed amoxicillin. Patient stating that cellulitis is continuing to worsen.  CBC without leukocytosis. Mild anemia. BMP within normal limits. LA 1.0. blood cultures pending. Physical exam with erythema and warmth of bilateral legs that seems worse on left leg with spreading up to left knee. Consulted with Hospitalist Dr. Jerral Ralph who agrees to admit patient. I have reviewed the patients home medicines and have made adjustments as needed    Social Determinants of Health:  none          Final Clinical Impression(s) / ED Diagnoses Final diagnoses:  Cellulitis of left lower extremity    Rx / DC Orders ED Discharge Orders     None         Dorthy Cooler, [place] 12/21/22 1706    Vanetta Mulders, MD 12/22/22 1513
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
## 2022-12-21 NOTE — Plan of Care (Signed)
  Problem: Pain Managment: Goal: General experience of comfort will improve 12/21/2022 1846 by Penelope Galas, RN Outcome: Progressing 12/21/2022 1846 by Penelope Galas, RN Outcome: Progressing   Problem: Safety: Goal: Ability to remain free from injury will improve 12/21/2022 1846 by Penelope Galas, RN Outcome: Progressing 12/21/2022 1846 by Penelope Galas, RN Outcome: Progressing   Problem: Skin Integrity: Goal: Risk for impaired skin integrity will decrease 12/21/2022 1846 by Penelope Galas, RN Outcome: Progressing 12/21/2022 1846 by Penelope Galas, RN Outcome: Progressing
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
## 2022-12-21 NOTE — ED Triage Notes (Signed)
Pt to ED c/o left lower leg cellulitis, reports went to PCP for this on Friday and was started on abx- Amoxicillin, reports leg is getting worse.

## 2022-12-21 NOTE — ED Notes (Signed)
Pt reports did not take bp medication today

## 2022-12-21 NOTE — ED Notes (Signed)
16:42 Kim at [facility] will send transport for Bed Ready at [facility] 3W RM#[id number].-ABB(NS)

## 2022-12-22 ENCOUNTER — Encounter (HOSPITAL_COMMUNITY): Payer: Self-pay | Admitting: Family Medicine

## 2022-12-22 ENCOUNTER — Observation Stay (HOSPITAL_COMMUNITY): Payer: Medicare PPO

## 2022-12-22 DIAGNOSIS — Z885 Allergy status to narcotic agent status: Secondary | ICD-10-CM | POA: Diagnosis not present

## 2022-12-22 DIAGNOSIS — Z79899 Other long term (current) drug therapy: Secondary | ICD-10-CM | POA: Diagnosis not present

## 2022-12-22 DIAGNOSIS — R0989 Other specified symptoms and signs involving the circulatory and respiratory systems: Secondary | ICD-10-CM | POA: Diagnosis not present

## 2022-12-22 DIAGNOSIS — Z6841 Body Mass Index (BMI) 40.0 and over, adult: Secondary | ICD-10-CM | POA: Diagnosis not present

## 2022-12-22 DIAGNOSIS — L03116 Cellulitis of left lower limb: Secondary | ICD-10-CM | POA: Diagnosis present

## 2022-12-22 DIAGNOSIS — L03119 Cellulitis of unspecified part of limb: Secondary | ICD-10-CM | POA: Diagnosis not present

## 2022-12-22 DIAGNOSIS — K589 Irritable bowel syndrome without diarrhea: Secondary | ICD-10-CM | POA: Diagnosis present

## 2022-12-22 DIAGNOSIS — M7989 Other specified soft tissue disorders: Secondary | ICD-10-CM | POA: Diagnosis present

## 2022-12-22 DIAGNOSIS — L02419 Cutaneous abscess of limb, unspecified: Secondary | ICD-10-CM | POA: Diagnosis not present

## 2022-12-22 DIAGNOSIS — K219 Gastro-esophageal reflux disease without esophagitis: Secondary | ICD-10-CM | POA: Diagnosis present

## 2022-12-22 DIAGNOSIS — I890 Lymphedema, not elsewhere classified: Secondary | ICD-10-CM | POA: Diagnosis present

## 2022-12-22 DIAGNOSIS — L89892 Pressure ulcer of other site, stage 2: Secondary | ICD-10-CM | POA: Diagnosis present

## 2022-12-22 DIAGNOSIS — Z1152 Encounter for screening for COVID-19: Secondary | ICD-10-CM | POA: Diagnosis not present

## 2022-12-22 DIAGNOSIS — Z91041 Radiographic dye allergy status: Secondary | ICD-10-CM | POA: Diagnosis not present

## 2022-12-22 DIAGNOSIS — G2581 Restless legs syndrome: Secondary | ICD-10-CM | POA: Diagnosis present

## 2022-12-22 DIAGNOSIS — Z7982 Long term (current) use of aspirin: Secondary | ICD-10-CM | POA: Diagnosis not present

## 2022-12-22 DIAGNOSIS — E7800 Pure hypercholesterolemia, unspecified: Secondary | ICD-10-CM | POA: Diagnosis present

## 2022-12-22 DIAGNOSIS — G4733 Obstructive sleep apnea (adult) (pediatric): Secondary | ICD-10-CM | POA: Diagnosis present

## 2022-12-22 DIAGNOSIS — R059 Cough, unspecified: Secondary | ICD-10-CM | POA: Diagnosis not present

## 2022-12-22 DIAGNOSIS — G40909 Epilepsy, unspecified, not intractable, without status epilepticus: Secondary | ICD-10-CM | POA: Diagnosis present

## 2022-12-22 DIAGNOSIS — R509 Fever, unspecified: Secondary | ICD-10-CM | POA: Diagnosis not present

## 2022-12-22 DIAGNOSIS — J45909 Unspecified asthma, uncomplicated: Secondary | ICD-10-CM | POA: Diagnosis present

## 2022-12-22 DIAGNOSIS — Z884 Allergy status to anesthetic agent status: Secondary | ICD-10-CM | POA: Diagnosis not present

## 2022-12-22 DIAGNOSIS — I10 Essential (primary) hypertension: Secondary | ICD-10-CM | POA: Diagnosis present

## 2022-12-22 DIAGNOSIS — L03115 Cellulitis of right lower limb: Secondary | ICD-10-CM | POA: Diagnosis present

## 2022-12-22 DIAGNOSIS — Z888 Allergy status to other drugs, medicaments and biological substances status: Secondary | ICD-10-CM | POA: Diagnosis not present

## 2022-12-22 DIAGNOSIS — D649 Anemia, unspecified: Secondary | ICD-10-CM | POA: Diagnosis present

## 2022-12-22 DIAGNOSIS — Z91048 Other nonmedicinal substance allergy status: Secondary | ICD-10-CM | POA: Diagnosis not present

## 2022-12-22 LAB — RESPIRATORY PANEL BY PCR

## 2022-12-22 LAB — SARS CORONAVIRUS 2 BY RT PCR: SARS Coronavirus 2 by RT PCR: NEGATIVE

## 2022-12-22 MED ORDER — PANTOPRAZOLE SODIUM 40 MG PO TBEC
40.0000 mg | DELAYED_RELEASE_TABLET | Freq: Every day | ORAL | Status: DC
  Administered 2022-12-22 – 2022-12-24 (×3): 40 mg via ORAL
  Filled 2022-12-22 (×3): qty 1

## 2022-12-22 MED ORDER — LORATADINE 10 MG PO TABS
10.0000 mg | ORAL_TABLET | Freq: Every day | ORAL | Status: DC
  Administered 2022-12-22 – 2022-12-24 (×3): 10 mg via ORAL
  Filled 2022-12-22 (×4): qty 1

## 2022-12-22 MED ORDER — IRBESARTAN 75 MG PO TABS
75.0000 mg | ORAL_TABLET | Freq: Every day | ORAL | Status: DC
  Administered 2022-12-22 – 2022-12-24 (×3): 75 mg via ORAL
  Filled 2022-12-22 (×3): qty 1

## 2022-12-22 MED ORDER — PRAMIPEXOLE DIHYDROCHLORIDE ER 0.75 MG PO TB24: 1.0000 | ORAL_TABLET | Freq: Every day | ORAL | Status: DC

## 2022-12-22 MED ORDER — PRAMIPEXOLE DIHYDROCHLORIDE 0.25 MG PO TABS
0.2500 mg | ORAL_TABLET | Freq: Three times a day (TID) | ORAL | Status: DC
  Administered 2022-12-22 – 2022-12-24 (×7): 0.25 mg via ORAL
  Filled 2022-12-22 (×7): qty 1

## 2022-12-22 MED ORDER — AMLODIPINE BESYLATE 5 MG PO TABS
5.0000 mg | ORAL_TABLET | Freq: Every day | ORAL | Status: DC
  Filled 2022-12-22: qty 1

## 2022-12-22 MED ORDER — SPIRONOLACTONE 25 MG PO TABS
50.0000 mg | ORAL_TABLET | Freq: Every day | ORAL | Status: DC
  Administered 2022-12-22 – 2022-12-24 (×3): 50 mg via ORAL
  Filled 2022-12-22 (×2): qty 2

## 2022-12-22 MED ORDER — FERROUS SULFATE 325 (65 FE) MG PO TABS
325.0000 mg | ORAL_TABLET | Freq: Every day | ORAL | Status: DC
  Administered 2022-12-22 – 2022-12-24 (×3): 325 mg via ORAL
  Filled 2022-12-22 (×3): qty 1

## 2022-12-22 MED ORDER — METOLAZONE 5 MG PO TABS
5.0000 mg | ORAL_TABLET | Freq: Every day | ORAL | Status: DC
  Administered 2022-12-22 – 2022-12-24 (×3): 5 mg via ORAL
  Filled 2022-12-22 (×3): qty 1

## 2022-12-22 MED ORDER — LEVOCETIRIZINE DIHYDROCHLORIDE 5 MG PO TABS: 5.0000 mg | ORAL_TABLET | Freq: Every day | ORAL | Status: DC

## 2022-12-22 MED ORDER — FLUTICASONE PROPIONATE 50 MCG/ACT NA SUSP
2.0000 | Freq: Two times a day (BID) | NASAL | Status: DC
  Administered 2022-12-22 – 2022-12-24 (×5): 2 via NASAL
  Filled 2022-12-22: qty 16

## 2022-12-22 MED ORDER — HYDROXYZINE HCL 25 MG PO TABS
25.0000 mg | ORAL_TABLET | Freq: Every day | ORAL | Status: DC | PRN
  Administered 2022-12-22: 25 mg via ORAL
  Filled 2022-12-22: qty 1

## 2022-12-22 MED ORDER — ASPIRIN 81 MG PO TBEC
81.0000 mg | DELAYED_RELEASE_TABLET | Freq: Two times a day (BID) | ORAL | Status: DC
  Administered 2022-12-22 – 2022-12-24 (×5): 81 mg via ORAL
  Filled 2022-12-22 (×5): qty 1

## 2022-12-22 MED ORDER — ROSUVASTATIN CALCIUM 20 MG PO TABS
20.0000 mg | ORAL_TABLET | Freq: Every day | ORAL | Status: DC
  Administered 2022-12-22 – 2022-12-24 (×3): 20 mg via ORAL
  Filled 2022-12-22 (×4): qty 1

## 2022-12-22 MED ORDER — CALCIUM CARB-CHOLECALCIFEROL 600-10 MG-MCG PO TABS: ORAL_TABLET | Freq: Every day | ORAL | Status: DC

## 2022-12-22 MED ORDER — POTASSIUM CHLORIDE CRYS ER 20 MEQ PO TBCR
20.0000 meq | EXTENDED_RELEASE_TABLET | Freq: Every day | ORAL | Status: DC
  Administered 2022-12-22 – 2022-12-24 (×3): 20 meq via ORAL
  Filled 2022-12-22 (×3): qty 1

## 2022-12-22 MED ORDER — PHENOBARBITAL 32.4 MG PO TABS
32.4000 mg | ORAL_TABLET | Freq: Every day | ORAL | Status: DC
  Administered 2022-12-22 – 2022-12-23 (×3): 32.4 mg via ORAL
  Filled 2022-12-22 (×3): qty 1

## 2022-12-22 MED ORDER — MONTELUKAST SODIUM 10 MG PO TABS
10.0000 mg | ORAL_TABLET | Freq: Every day | ORAL | Status: DC
  Administered 2022-12-22 – 2022-12-23 (×3): 10 mg via ORAL
  Filled 2022-12-22 (×3): qty 1

## 2022-12-22 MED ORDER — OYSTER SHELL CALCIUM/D3 500-5 MG-MCG PO TABS
1.0000 | ORAL_TABLET | Freq: Every day | ORAL | Status: DC
  Administered 2022-12-22 – 2022-12-24 (×3): 1 via ORAL
  Filled 2022-12-22 (×3): qty 1

## 2022-12-22 MED ORDER — VITAMIN A 3 MG (10000 UNIT) PO CAPS
10000.0000 [IU] | ORAL_CAPSULE | Freq: Every day | ORAL | Status: DC
  Administered 2022-12-22 – 2022-12-24 (×3): 10000 [IU] via ORAL
  Filled 2022-12-22 (×3): qty 1

## 2022-12-22 MED ORDER — SODIUM CHLORIDE 0.9 % IV SOLN
2.0000 g | INTRAVENOUS | Status: DC
  Administered 2022-12-22 – 2022-12-23 (×2): 2 g via INTRAVENOUS
  Filled 2022-12-22 (×2): qty 20

## 2022-12-22 MED ORDER — VITAMIN D 25 MCG (1000 UNIT) PO TABS
3000.0000 [IU] | ORAL_TABLET | Freq: Every day | ORAL | Status: DC
  Administered 2022-12-22 – 2022-12-24 (×3): 3000 [IU] via ORAL
  Filled 2022-12-22 (×3): qty 3

## 2022-12-22 MED ORDER — VITAMIN B-12 1000 MCG PO TABS
1000.0000 ug | ORAL_TABLET | Freq: Every day | ORAL | Status: DC
  Administered 2022-12-22 – 2022-12-24 (×3): 1000 ug via ORAL
  Filled 2022-12-22 (×3): qty 1

## 2022-12-22 NOTE — Progress Notes (Signed)
   12/22/22 1921  BiPAP/CPAP/SIPAP  BiPAP/CPAP/SIPAP Pt Type Adult  Reason BIPAP/CPAP not in use Non-compliant

## 2022-12-22 NOTE — Evaluation (Signed)
Physical Therapy Evaluation Patient Details Name: [name] MRN: [id number] DOB: Aug 22, 1950 Today's Date: 12/22/2022  History of Present Illness  72 yo female admitted with cellulitis. Hx of OSA, Sz, ankle fusion 2017, asthma, arthritis, lymphedema, anemia, obesity, COVID  Clinical Impression  On eval, pt was Min A for mobility. She walked ~20 feet with a RW. Pt tolerated activity well. Will plan to follow and progress activity as tolerated. Pt is undecided whether she wants f/u therapy. She has a f/u with orthopedic MD for hip pain. She may decide to wait until consult to see what's recommended.         If plan is discharge home, recommend the following: Assistance with cooking/housework;Assist for transportation;Help with stairs or ramp for entrance;A little help with walking and/or transfers;A little help with bathing/dressing/bathroom   Can travel by private vehicle        Equipment Recommendations None recommended by PT  Recommendations for Other Services       Functional Status Assessment Patient has had a recent decline in their functional status and demonstrates the ability to make significant improvements in function in a reasonable and predictable amount of time.     Precautions / Restrictions Precautions Precautions: Fall Precaution Comments: sz prec Restrictions Weight Bearing Restrictions: No      Mobility  Bed Mobility Overal bed mobility: Needs Assistance Bed Mobility: Supine to Sit     Supine to sit: Min assist, HOB elevated     General bed mobility comments: Assist to get to EOB. Increased time and effort for pt. Pt reports she has a trapeze on her bed at home    Transfers Overall transfer level: Needs assistance Equipment used: Rolling walker (2 wheels) Transfers: Sit to/from Stand Sit to Stand: Min assist           General transfer comment: Assist to rise, steady, control descent. Cues for safety, hand placement. Increased time.     Ambulation/Gait Ambulation/Gait assistance: Contact guard assist Gait Distance (Feet): 20 Feet Assistive device: Rolling walker (2 wheels) Gait Pattern/deviations: Step-through pattern, Decreased stride length       General Gait Details: CGA for ambulation around room-pt preferred to remain in room on today. No LOB. Pt tolerated distance well.  Stairs            Wheelchair Mobility     Tilt Bed    Modified Rankin (Stroke Patients Only)       Balance Overall balance assessment: Needs assistance         Standing balance support: Bilateral upper extremity supported, Reliant on assistive device for balance, During functional activity Standing balance-Leahy Scale: Fair                               Pertinent Vitals/Pain Pain Assessment Pain Assessment: Faces Faces Pain Scale: Hurts little more Pain Location: LEs Pain Descriptors / Indicators: Discomfort, Sore Pain Intervention(s): Monitored during session, Repositioned, Limited activity within patient's tolerance    Home Living Family/patient expects to be discharged to:: Private residence Living Arrangements: Spouse/significant other Available Help at Discharge: Family;Available 24 hours/day Type of Home: House Home Access: Ramped entrance       Home Layout: Able to live on main level with bedroom/bathroom Home Equipment: Rolling Walker (2 wheels);Cane - single point;Transport chair;Wheelchair - manual;Adaptive equipment;Lift chair;Hospital bed;Grab bars - tub/shower;Shower seat      Prior Function Prior Level of Function : Needs assist  Mobility Comments: ambulatory with RW ADLs Comments: needs assist     Extremity/Trunk Assessment   Upper Extremity Assessment Upper Extremity Assessment: Generalized weakness    Lower Extremity Assessment Lower Extremity Assessment: Generalized weakness    Cervical / Trunk Assessment Cervical / Trunk Assessment: Normal   Communication   Communication Communication: No apparent difficulties  Cognition Arousal: Alert Behavior During Therapy: WFL for tasks assessed/performed Overall Cognitive Status: Within Functional Limits for tasks assessed                                          General Comments      Exercises     Assessment/Plan    PT Assessment Patient needs continued PT services  PT Problem List Decreased strength;Decreased range of motion;Decreased activity tolerance;Decreased balance;Decreased mobility;Decreased knowledge of use of DME;Pain       PT Treatment Interventions DME instruction;Gait training;Balance training;Functional mobility training;Therapeutic activities;Therapeutic exercise;Patient/family education    PT Goals (Current goals can be found in the Care Plan section)  Acute Rehab PT Goals Patient Stated Goal: home soon PT Goal Formulation: With patient/family Time For Goal Achievement: 01/05/23 Potential to Achieve Goals: Good    Frequency Min 1X/week     Co-evaluation               AM-PAC PT "6 Clicks" Mobility  Outcome Measure Help needed turning from your back to your side while in a flat bed without using bedrails?: A Little Help needed moving from lying on your back to sitting on the side of a flat bed without using bedrails?: A Little Help needed moving to and from a bed to a chair (including a wheelchair)?: A Little Help needed standing up from a chair using your arms (e.g., wheelchair or bedside chair)?: A Little Help needed to walk in hospital room?: A Little Help needed climbing 3-5 steps with a railing? : Total 6 Click Score: 16    End of Session Equipment Utilized During Treatment: Gait belt Activity Tolerance: Patient tolerated treatment well Patient left: in chair;with call bell/phone within reach;with family/visitor present   PT Visit Diagnosis: Muscle weakness (generalized) (M62.81);Difficulty in walking, not elsewhere  classified (R26.2)    Time: 1354-1410 PT Time Calculation (min) (ACUTE ONLY): 16 min   Charges:   PT Evaluation $PT Eval Low Complexity: 1 Low   PT General Charges $$ ACUTE PT VISIT: 1 Visit            Faye Ramsay, PT Acute Rehabilitation  Office: [phone]

## 2022-12-22 NOTE — Progress Notes (Signed)
[facility] Progress Note Patient: [name] ZOX:[id number] DOB: May 24, 1950 DOA: 12/21/2022  DOS: the patient was seen and examined on 12/22/2022  Brief hospital course: PMH of f lymphedema, anemia, arthritis, asthma, GERD, hypertension, hyperlipidemia, IBS, obstructive sleep apnea, seizure disorder presented to hospital with complaints of worsening leg swelling and redness. Found to have cellulitis. Assessment and Plan: Bilateral leg cellulitis in the setting of lymphedema. Continue with IV antibiotic. Continue with diuretics. Appreciate wound care assistance. Will monitor.  Pressure injury. Patient reports bilateral pressure injury on her back of the thigh for which she is unable to use her lymphedema pumps for last couple of months. Will monitor wound care recommendation.  Cough. COVID-negative. Chest x-ray unremarkable. Monitor.  HTN. Discontinue Norvasc due to risk for peripheral edema. Continue rest of the medication. If the blood pressure is not controlled will add hydralazine.  GERD. Continue PPI.  HLD. Continue statin.  History of seizures. Continue phenobarbital.  Next  Obesity.  Class III Placing the patient at high as of poor outcome. Body mass index is 47.55 kg/m.    Subjective: No nausea no vomiting no fever no chills.  Reports leg pain.  Reports somewhat improvement in her leg pain and irritation from yesterday.  Physical Exam: General: in Mild distress, No Rash Cardiovascular: S1 and S2 Present, No Murmur Respiratory: Good respiratory effort, Bilateral Air entry present. No Crackles, No wheezes Abdomen: Bowel Sound present, No tenderness Extremities: Bilateral chronic lymphedema with significant warmth and redness.  Neuro: Alert and oriented x3, no new focal deficit  Data Reviewed: I have Reviewed nursing notes, Vitals, and Lab results. Since last encounter, pertinent lab results CBC and BMP   . I have ordered test including CBC and BMP   .   Disposition: Status is: Inpatient Remains inpatient appropriate because: Will continue IV antibiotics given the severity of the cellulitis.   Family Communication: No one at bedside Level of care: Med-Surg   Vitals:   12/22/22 0134 12/22/22 0604 12/22/22 1015 12/22/22 1411  BP: (!) 133/54 (!) 135/52 139/60 (!) 144/57  Pulse: 77 67 76 67  Resp: 17 17 16 16   Temp: 99.2 F (37.3 C) 98.8 F (37.1 C) 98.4 F (36.9 C) 98.3 F (36.8 C)  TempSrc: Oral Oral Oral Oral  SpO2: 94% 95% 95% 95%  Weight:      Height:         Author: Lynden Oxford, MD 12/22/2022 6:53 PM  Please look on [email or web address] to find out who is on call.

## 2022-12-22 NOTE — Hospital Course (Signed)
Brief hospital course: PMH of f lymphedema, anemia, arthritis, asthma, GERD, hypertension, hyperlipidemia, IBS, obstructive sleep apnea, seizure disorder presented to hospital with complaints of worsening leg swelling and redness. Found to have cellulitis. Assessment and Plan: Bilateral leg cellulitis in the setting of lymphedema. Presents with swelling and redness. Significant improvement in redness and swelling after therapy in the hospital. Blood cultures are negative so far for 48 hours. Continue with IV antibiotic.  Switch to p.o. tomorrow and home tomorrow. Continue with diuretics. Appreciate wound care assistance. Will monitor.  Pressure injury. Patient reports bilateral pressure injury on her back of the thigh for which she is unable to use her lymphedema pumps for last couple of months. Will monitor wound care recommendation.  Cough. COVID-negative. Chest x-ray unremarkable. Monitor.  HTN. Discontinue Norvasc due to risk for peripheral edema. Add hydralazine. Continue rest of the medication.  GERD. Continue PPI.  HLD. Continue statin.  History of seizures. Continue phenobarbital.  Next  Obesity.  Class III Placing the patient at high as of poor outcome. Body mass index is 47.55 kg/m.

## 2022-12-22 NOTE — Plan of Care (Signed)
  Problem: Activity: Goal: Risk for activity intolerance will decrease 12/22/2022 0721 by Penelope Galas, RN Outcome: Progressing 12/21/2022 1846 by Penelope Galas, RN Outcome: Progressing 12/21/2022 1846 by Penelope Galas, RN Outcome: Progressing   Problem: Pain Managment: Goal: General experience of comfort will improve 12/22/2022 0721 by Penelope Galas, RN Outcome: Progressing 12/21/2022 1846 by Penelope Galas, RN Outcome: Progressing 12/21/2022 1846 by Penelope Galas, RN Outcome: Progressing   Problem: Safety: Goal: Ability to remain free from injury will improve 12/22/2022 0721 by Penelope Galas, RN Outcome: Progressing 12/21/2022 1846 by Penelope Galas, RN Outcome: Progressing 12/21/2022 1846 by Penelope Galas, RN Outcome: Progressing

## 2022-12-22 NOTE — Consult Note (Signed)
WOC Nurse Consult Note: Reason for Consult:Bilateral LEs with lymphedema, cellulitis, minute partial thickness wounds with weeping. Patient failed outpatient oral antibiotic therapy. Consult performed remotely after review of the EMR including photographs. Wound type:infectious Pressure Injury POA: N/A Measurement:N/A Wound bed:N/A Drainage (amount, consistency, odor) dried serum (crusting) on LEs per EDP note Periwound:erythema, edema Dressing procedure/placement/frequency: I have provided Nursing with guidance for the care of the LEs using a daily cleanse followed by covering any area of weeping with folded layers of antimicrobial nonadherent,xeroform gauze. This is to be covered with ABD pads for comfort and secure by wrapping from just below toes to just below knees with Kerlix roll gauze/paper tape. The Kerlix is to be topped with 6-inch ACE bandages applied in a similar manner. Feet are to be placed into Prevalon boots. A sacral foam is to be placed for PI prevention.  Recommend patient return to the use of her lymphedema equipment at home when able, post resolution of this infectious episode.  WOC nursing team will not follow, but will remain available to this patient, the nursing and medical teams.  Please re-consult if needed.  Thank you for inviting [place] to participate in this patient's Plan of Care.  Ladona Mow, MSN, RN, CNS, GNP, Leda Min, [employer], [employer] phone:  [phone]

## 2022-12-23 DIAGNOSIS — L03119 Cellulitis of unspecified part of limb: Secondary | ICD-10-CM | POA: Diagnosis not present

## 2022-12-23 DIAGNOSIS — L02419 Cutaneous abscess of limb, unspecified: Secondary | ICD-10-CM | POA: Diagnosis not present

## 2022-12-23 MED ORDER — HYDRALAZINE HCL 25 MG PO TABS
25.0000 mg | ORAL_TABLET | Freq: Three times a day (TID) | ORAL | Status: DC
  Administered 2022-12-23 – 2022-12-24 (×3): 25 mg via ORAL
  Filled 2022-12-23 (×3): qty 1

## 2022-12-23 MED ORDER — TRAMADOL HCL 50 MG PO TABS
50.0000 mg | ORAL_TABLET | Freq: Four times a day (QID) | ORAL | Status: DC | PRN
  Administered 2022-12-23: 50 mg via ORAL
  Filled 2022-12-23: qty 1

## 2022-12-23 NOTE — Progress Notes (Signed)
Mobility Specialist - Progress Note   12/23/22 1049  Mobility  Activity Transferred from bed to chair;Ambulated with assistance in room  Level of Assistance Moderate assist, patient does 50-74%  Assistive Device Front wheel walker  Distance Ambulated (ft) 5 ft  Activity Response Tolerated well  Mobility Referral Yes  $Mobility charge 1 Mobility  Mobility Specialist Start Time (ACUTE ONLY) [id number]  Mobility Specialist Stop Time (ACUTE ONLY) 0946  Mobility Specialist Time Calculation (min) (ACUTE ONLY) 22 min   Pt received in bed and agreeable to mobility. Pt was modA from supine to sitting. Required +2 assistance from STS. Pt fatigued with exertion, prompting in limited distance. No complaints during session. Pt to recliner after session with all needs met.   [facility]

## 2022-12-23 NOTE — Progress Notes (Signed)
   12/23/22 2125  BiPAP/CPAP/SIPAP  $ Non-Invasive Home Ventilator  Initial  BiPAP/CPAP/SIPAP Pt Type Adult (Machine plugged into red outlet.)  BiPAP/CPAP/SIPAP Resmed  Mask Type Nasal mask  Mask Size Medium  Respiratory Rate 16 breaths/min  FiO2 (%) 21 %  Patient Home Equipment No  Auto Titrate Yes (V-Auto mode 5-25 cm H2O)  BiPAP/CPAP /SiPAP Vitals  Pulse Rate 74  Resp 16  SpO2 95 %  Bilateral Breath Sounds Clear;Diminished  MEWS Score/Color  MEWS Score 0  MEWS Score Color [name]

## 2022-12-23 NOTE — Plan of Care (Signed)
  Problem: Education: Goal: Knowledge of General Education information will improve Description: Including pain rating scale, medication(s)/side effects and non-pharmacologic comfort measures Outcome: Progressing   Problem: Health Behavior/Discharge Planning: Goal: Ability to manage health-related needs will improve Outcome: Progressing   Problem: Pain Managment: Goal: General experience of comfort will improve Outcome: Progressing   

## 2022-12-23 NOTE — TOC Initial Note (Signed)
Transition of Care [facility]) - Initial/Assessment Note   Patient Details  Name: [name] MRN: [id number] Date of Birth: 1951-01-01  Transition of Care [facility]) CM/SW Contact:    Ewing Schlein, LCSW Phone Number: 12/23/2022, 3:16 PM  Clinical Narrative: PT evaluation recommended HHPT. CSW followed up with patient regarding recommendation. Patient declined HHPT at this time and reported she will be following up with orthopedics later this month, so she would like go to that appointment before doing any PT. TOC signing off at this time.                Expected Discharge Plan: Home/Self Care Barriers to Discharge: Continued Medical Work up  Patient Goals and CMS Choice Patient states their goals for this hospitalization and ongoing recovery are:: Return home and follow up with ortho Choice offered to / list presented to : NA  Expected Discharge Plan and Services In-house Referral: Clinical Social Work Post Acute Care Choice: NA Living arrangements for the past 2 months: Single Family Home             DME Arranged: N/A DME Agency: NA  Prior Living Arrangements/Services Living arrangements for the past 2 months: Single Family Home Lives with:: Spouse Patient language and need for interpreter reviewed:: Yes Do you feel safe going back to the place where you live?: Yes      Need for Family Participation in Patient Care: No (Comment) Care giver support system in place?: Yes (comment) Criminal Activity/Legal Involvement Pertinent to Current Situation/Hospitalization: No - Comment as needed  Activities of Daily Living Home Assistive Devices/Equipment: Environmental consultant (specify type), Wheelchair, Medical laboratory scientific officer (specify quad or straight) ADL Screening (condition at time of admission) Patient's cognitive ability adequate to safely complete daily activities?: Yes Is the patient deaf or have difficulty hearing?: No Does the patient have difficulty seeing, even when wearing glasses/contacts?: No Does the patient  have difficulty concentrating, remembering, or making decisions?: No Patient able to express need for assistance with ADLs?: Yes Does the patient have difficulty dressing or bathing?: No Independently performs ADLs?: Yes (appropriate for developmental age) Does the patient have difficulty walking or climbing stairs?: Yes Weakness of Legs: Both Weakness of Arms/Hands: None  Emotional Assessment Appearance:: Appears stated age Attitude/Demeanor/Rapport: Engaged Affect (typically observed): Accepting Orientation: : Oriented to Self, Oriented to Place, Oriented to  Time, Oriented to Situation Alcohol / Substance Use: Not Applicable Psych Involvement: No (comment)  Admission diagnosis:  Cellulitis of left lower extremity [L03.116] Cellulitis and abscess of lower extremity [L03.119, L02.419] Bilateral lower leg cellulitis [L03.116, L03.115] Cellulitis [L03.90] Patient Active Problem List   Diagnosis Date Noted   Cellulitis and abscess of lower extremity 12/21/2022   Bilateral lower leg cellulitis 12/21/2022   Aneurysm of carotid artery (HCC) 06/24/2021   Absolute anemia 06/24/2021   Cognitive impairment 03/10/2021   Acute metabolic encephalopathy 03/03/2021   Cellulitis and abscess of foot 02/07/2021   Acute renal failure (ARF) (HCC) 01/19/2021   ARF (acute renal failure) (HCC) 01/18/2021   SIRS (systemic inflammatory response syndrome) (HCC) 01/09/2021   Acute encephalopathy 01/09/2021   Hyponatremia 12/20/2018   Cellulitis 12/20/2018   Cellulitis of left lower extremity 12/19/2018   Hypokalemia 02/17/2017   DOE (dyspnea on exertion) 01/08/2017   Medication management 01/08/2017   Weight gain 01/08/2017   Lymphedema of right lower extremity 10/22/2016   Wound dehiscence, surgical, subsequent encounter 05/12/2016   S/P ankle arthrodesis 03/12/2016   OSA on CPAP 09/10/2015   Nocturia more than twice  per night 09/10/2015   Morbid obesity due to excess calories (HCC) 09/10/2015    Loss of consciousness (HCC) 08/15/2015   Fracture of ankle, trimalleolar, closed 07/03/2015   Syncope 07/02/2015   Seizure disorder (HCC) 07/02/2015   Essential hypertension 07/02/2015   Bilateral lower extremity edema 07/02/2015   Closed right ankle fracture 07/02/2015   GERD (gastroesophageal reflux disease) 07/02/2015   Mixed dyslipidemia 07/02/2015   Anosmia/chronic 07/02/2015   Leukocytosis 07/02/2015   Restless legs syndrome (RLS) 04/28/2013   Sleep apnea with use of continuous positive airway pressure (CPAP) 01/24/2013   PCP:  [name], MD Pharmacy:   [facility] [phone] [place], [place] - [address] [place] [address] Phone: [phone] Fax: [phone]  Social Determinants of Health (SDOH) Social History: SDOH Screenings   Food Insecurity: No Food Insecurity (12/21/2022)  Housing: Low Risk  (12/21/2022)  Transportation Needs: No Transportation Needs (12/21/2022)  Utilities: Not At Risk (12/21/2022)  Tobacco Use: Medium Risk (12/22/2022)   SDOH Interventions:    Readmission Risk Interventions    03/09/2021    3:30 PM 03/04/2021    3:28 PM  Readmission Risk Prevention Plan  Transportation Screening Complete Complete  PCP or Specialist Appt within 3-5 Days Complete   HRI or Home Care Consult Complete   Social Work Consult for Recovery Care Planning/Counseling Complete   Palliative Care Screening Complete   Medication Review Oceanographer) Complete Complete  PCP or Specialist appointment within 3-5 days of discharge  Complete  HRI or Home Care Consult  Complete  SW Recovery Care/Counseling Consult  Complete  Palliative Care Screening  Not Applicable  Skilled Nursing Facility  Not Applicable

## 2022-12-23 NOTE — Progress Notes (Signed)
[facility] Progress Note Patient: [name] ZOX:[id number] DOB: 04-07-1951 DOA: 12/21/2022  DOS: the patient was seen and examined on 12/23/2022  Brief hospital course: PMH of f lymphedema, anemia, arthritis, asthma, GERD, hypertension, hyperlipidemia, IBS, obstructive sleep apnea, seizure disorder presented to hospital with complaints of worsening leg swelling and redness. Found to have cellulitis. Assessment and Plan: Bilateral leg cellulitis in the setting of lymphedema. Presents with swelling and redness. Significant improvement in redness and swelling after therapy in the hospital. Blood cultures are negative so far for 48 hours. Continue with IV antibiotic.  Switch to p.o. tomorrow and home tomorrow. Continue with diuretics. Appreciate wound care assistance. Will monitor.  Pressure injury. Patient reports bilateral pressure injury on her back of the thigh for which she is unable to use her lymphedema pumps for last couple of months. Will monitor wound care recommendation.  Cough. COVID-negative. Chest x-ray unremarkable. Monitor.  HTN. Discontinue Norvasc due to risk for peripheral edema. Add hydralazine. Continue rest of the medication.  GERD. Continue PPI.  HLD. Continue statin.  History of seizures. Continue phenobarbital.  Next  Obesity.  Class III Placing the patient at high as of poor outcome. Body mass index is 47.55 kg/m.    Subjective: No nausea no vomiting no fever no chills.  No chest pain.  Swelling of the leg improving.  Physical Exam: General: in Mild distress, No Rash Cardiovascular: S1 and S2 Present, No Murmur Respiratory: Good respiratory effort, Bilateral Air entry present. No Crackles, No wheezes Abdomen: Bowel Sound present, No tenderness Extremities: Improving bilateral edema Neuro: Alert and oriented x3, no new focal deficit  Data Reviewed: I have Reviewed nursing notes, Vitals, and Lab results. Since last encounter,  pertinent lab results CBC and BMP   . I have ordered test including CBC and BMP  .   Disposition: Status is: Inpatient Remains inpatient appropriate because: Monitor electrolytes.  Continue antibiotics.  Monitor cultures for 72 hours.   Family Communication: Family at bedside. Level of care: Med-Surg   Vitals:   12/23/22 0446 12/23/22 0600 12/23/22 0929 12/23/22 1354  BP: (!) 154/61  (!) 181/61 (!) 157/64  Pulse: 67  67 69  Resp: 17  16 17   Temp: 98.9 F (37.2 C)   98.7 F (37.1 C)  TempSrc: Oral     SpO2: 96%  94% 95%  Weight:  127 kg    Height:         Author: Lynden Oxford, MD 12/23/2022 6:52 PM  Please look on [email or web address] to find out who is on call.

## 2022-12-24 DIAGNOSIS — I10 Essential (primary) hypertension: Secondary | ICD-10-CM | POA: Diagnosis not present

## 2022-12-24 DIAGNOSIS — L03119 Cellulitis of unspecified part of limb: Secondary | ICD-10-CM | POA: Diagnosis not present

## 2022-12-24 DIAGNOSIS — L03116 Cellulitis of left lower limb: Secondary | ICD-10-CM | POA: Diagnosis not present

## 2022-12-24 LAB — BASIC METABOLIC PANEL
Anion gap: 8 (ref 5–15)
BUN: 13 mg/dL (ref 8–23)
CO2: 26 mmol/L (ref 22–32)
Calcium: 9.3 mg/dL (ref 8.9–10.3)
Chloride: 100 mmol/L (ref 98–111)
Creatinine, Ser: 0.79 mg/dL (ref 0.44–1.00)
GFR, Estimated: >60 mL/min (ref >60)
Glucose, Bld: 124 mg/dL — ABNORMAL HIGH (ref 70–99)
Potassium: 3.4 mmol/L — ABNORMAL LOW (ref 3.5–5.1)
Sodium: 134 mmol/L — ABNORMAL LOW (ref 135–145)

## 2022-12-24 LAB — CBC
HCT: 35.7 % — ABNORMAL LOW (ref 36.0–46.0)
Hemoglobin: 11.7 g/dL — ABNORMAL LOW (ref 12.0–15.0)
MCH: 29.4 pg (ref 26.0–34.0)
MCHC: 32.8 g/dL (ref 30.0–36.0)
MCV: 89.7 fL (ref 80.0–100.0)
Platelets: 187 10*3/uL (ref 150–400)
RBC: 3.98 MIL/uL (ref 3.87–5.11)
RDW: 13.2 % (ref 11.5–15.5)
WBC: 5.7 10*3/uL (ref 4.0–10.5)
nRBC: 0 % (ref 0.0–0.2)

## 2022-12-24 LAB — MAGNESIUM: Magnesium: 2 mg/dL (ref 1.7–2.4)

## 2022-12-24 MED ORDER — HYDRALAZINE HCL 25 MG PO TABS: 25.0000 mg | ORAL_TABLET | Freq: Three times a day (TID) | ORAL | 3 refills | Status: DC

## 2022-12-24 MED ORDER — DOXYCYCLINE HYCLATE 100 MG PO TABS: 100.0000 mg | ORAL_TABLET | Freq: Two times a day (BID) | ORAL | 0 refills | Status: AC

## 2022-12-24 MED ORDER — DOXYCYCLINE HYCLATE 100 MG PO TABS
100.0000 mg | ORAL_TABLET | Freq: Two times a day (BID) | ORAL | Status: DC
  Administered 2022-12-24: 100 mg via ORAL
  Filled 2022-12-24: qty 1

## 2022-12-24 NOTE — Progress Notes (Signed)
Physical Therapy Treatment Patient Details Name: [name] MRN: [id number] DOB: January 01, 1951 Today's Date: 12/24/2022   History of Present Illness 72 yo female admitted with cellulitis. Hx of OSA, Sz, ankle fusion 2017, asthma, arthritis, lymphedema, anemia, obesity, COVID    PT Comments  Pt very cooperative and with improved pain control this am.  Pt up to ambulate increased distance in halls and HEP initiated - pt requesting therex to prepare for planned R THR.  Pt hopeful for dc home this date.   If plan is discharge home, recommend the following: Assistance with cooking/housework;Assist for transportation;Help with stairs or ramp for entrance;A little help with walking and/or transfers;A little help with bathing/dressing/bathroom   Can travel by private vehicle        Equipment Recommendations  None recommended by PT    Recommendations for Other Services       Precautions / Restrictions Precautions Precautions: Fall Precaution Comments: sz prec Restrictions Weight Bearing Restrictions: No     Mobility  Bed Mobility Overal bed mobility: Needs Assistance Bed Mobility: Supine to Sit, Sit to Supine     Supine to sit: Min assist, HOB elevated Sit to supine: Mod assist   General bed mobility comments: assist with R LE OOB and bilat LEs into bed; extensive use of bedrails, HOB elevated    Transfers Overall transfer level: Needs assistance Equipment used: Rolling walker (2 wheels) Transfers: Sit to/from Stand Sit to Stand: Contact guard assist           General transfer comment: Steady assist with cues for use of UEs    Ambulation/Gait Ambulation/Gait assistance: Contact guard assist Gait Distance (Feet): 36 Feet (twice with seated rest break) Assistive device: Rolling walker (2 wheels) Gait Pattern/deviations: Step-through pattern, Decreased stride length Gait velocity: decr     General Gait Details: CGA for ambulation around room-pt preferred to remain  in room on today. No LOB. Pt tolerated distance well.   Stairs             Wheelchair Mobility     Tilt Bed    Modified Rankin (Stroke Patients Only)       Balance Overall balance assessment: Needs assistance Sitting-balance support: Feet supported, No upper extremity supported Sitting balance-Leahy Scale: Good     Standing balance support: Bilateral upper extremity supported, Reliant on assistive device for balance, During functional activity Standing balance-Leahy Scale: Fair                              Cognition Arousal: Alert Behavior During Therapy: WFL for tasks assessed/performed Overall Cognitive Status: Within Functional Limits for tasks assessed                                          Exercises General Exercises - Lower Extremity Ankle Circles/Pumps: AROM, Both, 15 reps, Supine Quad Sets: AROM, Both, 10 reps, Supine Heel Slides: AAROM, Right, 15 reps, Supine Hip ABduction/ADduction: AAROM, Right, 15 reps, Supine    General Comments        Pertinent Vitals/Pain Pain Assessment Pain Assessment: 0-10 Pain Score: 3  Pain Location: LEs Pain Descriptors / Indicators: Discomfort, Sore Pain Intervention(s): Limited activity within patient's tolerance, Monitored during session, Premedicated before session    Home Living  Prior Function            PT Goals (current goals can now be found in the care plan section) Acute Rehab PT Goals Patient Stated Goal: home soon PT Goal Formulation: With patient/family Time For Goal Achievement: 01/05/23 Potential to Achieve Goals: Good Progress towards PT goals: Progressing toward goals    Frequency    Min 1X/week      PT Plan      Co-evaluation              AM-PAC PT "6 Clicks" Mobility   Outcome Measure  Help needed turning from your back to your side while in a flat bed without using bedrails?: A Little Help needed  moving from lying on your back to sitting on the side of a flat bed without using bedrails?: A Little Help needed moving to and from a bed to a chair (including a wheelchair)?: A Little Help needed standing up from a chair using your arms (e.g., wheelchair or bedside chair)?: A Little Help needed to walk in hospital room?: A Little Help needed climbing 3-5 steps with a railing? : Total 6 Click Score: 16    End of Session Equipment Utilized During Treatment: Gait belt Activity Tolerance: Patient tolerated treatment well Patient left: in bed;with call bell/phone within reach Nurse Communication: Mobility status PT Visit Diagnosis: Muscle weakness (generalized) (M62.81);Difficulty in walking, not elsewhere classified (R26.2)     Time: 4010-2725 PT Time Calculation (min) (ACUTE ONLY): 42 min  Charges:    $Gait Training: 23-37 mins $Therapeutic Exercise: 8-22 mins PT General Charges $$ ACUTE PT VISIT: 1 Visit                     Mauro Kaufmann PT Acute Rehabilitation Services Pager [phone] Office [phone]    , 12/24/2022, 11:24 AM

## 2022-12-24 NOTE — Discharge Summary (Signed)
Physician Discharge Summary   Patient: [name] MRN: [id number] DOB: [birth year]  Admit date:     12/21/2022  Discharge date: 12/24/22  Discharge Physician: Thad Ranger, MD    PCP: [name], MD   Recommendations at discharge:   Continue doxycycline 100 mg twice daily for 7 days Wound care per instructions below  Discharge Diagnoses:    Cellulitis bilateral lower extremity Lymphedema  Essential hypertension GERD Hyperlipidemia History of seizures Class III obesity  Hospital Course:  Patient is a 72 year old female with lymphedema, anemia, arthritis, asthma, GERD, hypertension, hyperlipidemia, IBS, obstructive sleep apnea, seizure disorder presented to hospital with complaints of worsening leg swelling and redness. Found to have bilateral cellulitis of lower extremities in the setting of lymphedema.  Assessment and Plan:  Bilateral lower extremity cellulitis in the setting of lymphedema. -Presents with swelling and redness. -Patient was placed on IV Rocephin.  Blood cultures negative so far -Patient was placed on IV Rocephin, transition to oral doxycycline for 7 days. - Continue with diuretics. -Wound care was consulted, appreciate assistance.   Pressure injury. Patient reports bilateral pressure injury on her back of the thigh for which she is unable to use her lymphedema pumps for last couple of months. -Continue wound care    HTN. Discontinue Norvasc due to risk for peripheral edema. -Continue hydralazine 25 mg 3 times daily, torsemide, metolazone, spironolactone   GERD. Continue PPI.   HLD. Continue statin.   History of seizures. Continue phenobarbital.     Obesity.  Class III Placing the patient at high as of poor outcome. Body mass index is 47.55 kg/m.          Pain control - [employer] Controlled Substance Reporting System database was reviewed. and patient was instructed, not to drive, operate heavy machinery, perform activities  at heights, swimming or participation in water activities or provide baby-sitting services while on Pain, Sleep and Anxiety Medications; until their outpatient Physician has advised to do so again. Also recommended to not to take more than prescribed Pain, Sleep and Anxiety Medications.  Consultants: None Procedures performed: None Disposition: Home Diet recommendation:  Discharge Diet Orders (From admission, onward)     Start     Ordered   12/24/22 0000  Diet - low sodium heart healthy        12/24/22 1011           DISCHARGE MEDICATION: Allergies as of 12/24/2022       Reactions   Compazine Shortness Of Breath   Prochlorperazine Maleate Shortness Of Breath   Topamax [topiramate] Hives, Rash   Codeine Sulfate Nausea Only   Other Other (See Comments)   NO MRI'S   Seasonal Ic [cholestatin] Hives   Adhesive [tape] Rash   Iodinated Contrast Media Rash   Uncoded Allergy. Allergen: contrast dyes   Pedi-pre Tape Spray [wound Dressing Adhesive] Rash        Medication List     STOP taking these medications    amLODipine 5 MG tablet Commonly known as: NORVASC   amoxicillin-clavulanate 875-125 MG tablet Commonly known as: AUGMENTIN   fluconazole 150 MG tablet Commonly known as: DIFLUCAN       TAKE these medications    albuterol 108 (90 Base) MCG/ACT inhaler Commonly known as: VENTOLIN HFA Inhale 2 puffs into the lungs 2 (two) times daily as needed for wheezing or shortness of breath.   aspirin EC 81 MG tablet Take 1 tablet (81 mg total) by mouth 2 (two) times daily.  What changed: when to take this   B-12 PO Take 1 capsule by mouth daily.   CALCIUM 600+D PO Take 1 tablet by mouth daily.   cholecalciferol 1000 units tablet Commonly known as: VITAMIN D Take 2,000 Units by mouth daily.   doxycycline 100 MG tablet Commonly known as: VIBRA-TABS Take 1 tablet (100 mg total) by mouth 2 (two) times daily for 7 days.   ferrous sulfate 325 (65 FE) MG tablet Take  1 tablet (325 mg total) by mouth daily with breakfast.   fluticasone 50 MCG/ACT nasal spray Commonly known as: FLONASE Place 1 spray into the nose 2 (two) times daily.   hydrALAZINE 25 MG tablet Commonly known as: APRESOLINE Take 1 tablet (25 mg total) by mouth 3 (three) times daily.   hydrOXYzine 25 MG tablet Commonly known as: ATARAX Take 1 tablet (25 mg total) by mouth daily as needed for itching or anxiety. What changed: when to take this   levocetirizine 5 MG tablet Commonly known as: XYZAL Take 5 mg by mouth at bedtime.   magnesium oxide 400 MG tablet Commonly known as: MAG-OX Take 2 tablets (800 mg total) by mouth at bedtime.   metolazone 5 MG tablet Commonly known as: ZAROXOLYN Take 1 tablet (5 mg total) by mouth daily.   montelukast 10 MG tablet Commonly known as: SINGULAIR Take 10 mg by mouth at bedtime.   nystatin powder Commonly known as: MYCOSTATIN/NYSTOP Apply 1 application  topically 2 (two) times daily. Underneath breast and groin   pantoprazole 40 MG tablet Commonly known as: PROTONIX Take 1 tablet by mouth daily.   PHENobarbital 32.4 MG tablet Commonly known as: LUMINAL TAKE 1 TABLET (32.4 MG TOTAL) BY MOUTH AT BEDTIME.   potassium chloride SA 20 MEQ tablet Commonly known as: KLOR-CON M Take 1 tablet (20 mEq total) by mouth daily.   Pramipexole Dihydrochloride 0.75 MG Tb24 TAKE 1 TABLET (0.75 MG TOTAL) BY MOUTH AT BEDTIME. What changed: Another medication with the same name was changed. Make sure you understand how and when to take each.   pramipexole 0.25 MG tablet Commonly known as: MIRAPEX TAKE 1 TO 2 TABLETS AT 6 PM FOR RESTLESS LEG SYNDROME What changed: See the new instructions.   rosuvastatin 20 MG tablet Commonly known as: CRESTOR TAKE 1 TABLET (20 MG TOTAL) BY MOUTH DAILY. NEEDS APPOINTMENT FOR FUTURE REFILLS   spironolactone 50 MG tablet Commonly known as: ALDACTONE TAKE 1 TABLET (50 MG TOTAL) BY MOUTH DAILY. **KEEP APPOINTMENT  WITH DR. HARDING FOR FUTURE REFILL**   torsemide 20 MG tablet Commonly known as: DEMADEX Take 20 mg by mouth daily.   traMADol 50 MG tablet Commonly known as: ULTRAM Take 50-100 mg by mouth in the morning, at noon, and at bedtime.   triamcinolone ointment 0.1 % Commonly known as: KENALOG Apply 1 Application topically in the morning, at noon, in the evening, and at bedtime.   valsartan 80 MG tablet Commonly known as: DIOVAN Take 1 tablet (80 mg total) by mouth daily.   Vitamin A 2400 MCG (8000 UT) Caps Take 1 capsule by mouth daily.               Discharge Care Instructions  (From admission, onward)           Start     Ordered   12/24/22 0000  Discharge wound care:       Comments: Wound care  Daily      Comments: Wound care to bilateral LEs:  Wash with skin cleanser, pat dry. Apply xeroform gauze to areas of weeping, top with ABD pads. Wrap from just below toes to just below knees with Kerlix roll gauze, secure with paper tape. Top Kerlix with 6-inch ACE bandage applied in a similar manner. Change daily and PRN drainage strikethrough. Place feet into Prevalon boots.   12/24/22 1011            Follow-up Information     [name], MD. Schedule an appointment as soon as possible for a visit in 2 week(s).   Specialty: Family Medicine Why: for hospital follow-up Contact information: [address] [place] [address] [place] [address] [phone]                Discharge Exam: Ceasar Mons Weights   12/21/22 1230 12/21/22 2215 12/23/22 0600  Weight: 117.9 kg 130.3 kg 127 kg   S: States feeling better, looking forward to go home today.  States has been can help with the dressing changes on the lower extremities.  BP (!) 154/54 (BP Location: Right Arm)   Pulse 64   Temp 98.6 F (37 C) (Oral)   Resp 16   Ht 5\' 2"  (1.575 m)   Wt 127 kg   SpO2 95%   BMI 51.21 kg/m   Physical Exam General: Alert and oriented x 3, NAD Cardiovascular: S1 S2  clear, RRR.  Respiratory: CTAB, no wheezing, rales or rhonchi Gastrointestinal: Soft, nontender, nondistended, NBS Ext: improving bilateral edema Neuro: no new deficits Skin: Dressing intact Psych: Normal affect and demeanor, alert and oriented x3    Condition at discharge: fair  The results of significant diagnostics from this hospitalization (including imaging, microbiology, ancillary and laboratory) are listed below for reference.   Imaging Studies: DG Chest 1 View  Result Date: 12/22/2022 CLINICAL DATA:  Cough and fevers EXAM: PORTABLE CHEST 1 VIEW COMPARISON:  03/01/2021 FINDINGS: Cardiac shadow is within normal limits. Mild central vascular congestion is noted. No edema is seen. No focal infiltrate is noted. No bony abnormality is seen. IMPRESSION: Mild vascular congestion without significant edema. Electronically Signed   By: Alcide Clever M.D.   On: 12/22/2022 02:23   DG Tibia/Fibula Right  Result Date: 12/21/2022 CLINICAL DATA:  Cellulitis. EXAM: RIGHT TIBIA AND FIBULA - 2 VIEW COMPARISON:  None Available. FINDINGS: Extensive soft tissue swelling and edema. There is truncation of the distal aspect of the fibula from presumed amputation. There is fixation plate and screws along the distal tibia with arthrodesis of the talus. Chronic changes of the hindfoot at the edge of the imaging field elsewhere. No definite erosive changes. Degenerative changes are also seen of the knee joint at the edge of the imaging field. IMPRESSION: Extensive soft tissue edema and osteopenia. Previous surgical changes with hardware arthrodesis of the tibiotalar joint and resection of the distal fibula. Please correlate with any prior imaging to assess stability of the hardware and surgical change. Electronically Signed   By: Karen Kays M.D.   On: 12/21/2022 17:14   DG Tibia/Fibula Left  Result Date: 12/21/2022 CLINICAL DATA:  Cellulitis. EXAM: LEFT TIBIA AND FIBULA - 2 VIEW COMPARISON:  None Available.  FINDINGS: Soft tissue swelling and edema. Osteopenia. No acute fracture or dislocation. Moderate degenerative changes of the knee joint with joint space loss and osteophytes. If there is further concern infection including bone infection, MRI or bone scan could be considered as clinically appropriate for further sensitivity. IMPRESSION: Osteopenia. Extensive soft tissue edema Electronically Signed   By:  Karen Kays M.D.   On: 12/21/2022 17:13   VAS [place] LOWER EXTREMITY VENOUS (DVT)  Result Date: 12/20/2022  Lower Venous DVT Study Patient Name:  YSA EVOY  Date of Exam:   12/18/2022 Medical Rec #: [id number]       Accession #:    [id number] Date of Birth: 1950/05/23       Patient Gender: F Patient Age:   35 years Exam Location:  [facility] Procedure:      VAS [place] LOWER EXTREMITY VENOUS (DVT) Referring Phys: [name] --------------------------------------------------------------------------------  Indications: Pain, Swelling, and Edema.  Risk Factors: Limited Mobility obesity and past pregnancy. Limitations: Body habitus. Comparison Study: No significant changes seen since previous exam 02/07/21. Performing Technologist: Shona Simpson  Examination Guidelines: A complete evaluation includes B-mode imaging, spectral Doppler, color Doppler, and power Doppler as needed of all accessible portions of each vessel. Bilateral testing is considered an integral part of a complete examination. Limited examinations for reoccurring indications may be performed as noted. The reflux portion of the exam is performed with the patient in reverse Trendelenburg.  +-----+---------------+---------+-----------+----------+--------------+ RIGHTCompressibilityPhasicitySpontaneityPropertiesThrombus Aging +-----+---------------+---------+-----------+----------+--------------+ CFV  Full           Yes      Yes                                 +-----+---------------+---------+-----------+----------+--------------+    +---------+---------------+---------+-----------+---------------+--------------+ LEFT     CompressibilityPhasicitySpontaneityProperties     Thrombus Aging +---------+---------------+---------+-----------+---------------+--------------+ CFV      Full           Yes      Yes                                      +---------+---------------+---------+-----------+---------------+--------------+ SFJ      Full                                                             +---------+---------------+---------+-----------+---------------+--------------+ FV Prox  Full                                                             +---------+---------------+---------+-----------+---------------+--------------+ FV Mid   Full                                                             +---------+---------------+---------+-----------+---------------+--------------+ FV Distal                                   patent by color               +---------+---------------+---------+-----------+---------------+--------------+ PFV      Full                                                             +---------+---------------+---------+-----------+---------------+--------------+  POP      Full           Yes      Yes                                      +---------+---------------+---------+-----------+---------------+--------------+ PTV      Full                                                             +---------+---------------+---------+-----------+---------------+--------------+ PERO                                        patent by color               +---------+---------------+---------+-----------+---------------+--------------+    Summary: RIGHT: - No evidence of common femoral vein obstruction.  LEFT: - There is no evidence of deep vein thrombosis in the lower extremity.  - No cystic structure found in the popliteal fossa.  *See table(s) above for measurements and  observations. Electronically signed by Gerarda Fraction on 12/20/2022 at 9:28:31 AM.    Final     Microbiology: Results for orders placed or performed during the hospital encounter of 12/21/22  Culture, blood (routine x 2)     Status: None (Preliminary result)   Collection Time: 12/21/22 12:42 PM   Specimen: BLOOD  Result Value Ref Range Status   Specimen Description   Final    BLOOD BLOOD RIGHT FOREARM Performed at [facility], [address] [place], [place] [address]    Special Requests   Final    Blood Culture adequate volume BOTTLES DRAWN AEROBIC AND ANAEROBIC Performed at [facility], [address], [place], [place] [address]    Culture   Final    NO GROWTH 3 DAYS Performed at [facility], [address] [place], [place] [address]    Report Status PENDING  Incomplete  SARS Coronavirus 2 by RT PCR (hospital order, performed in [facility] hospital lab) *cepheid single result test* Anterior Nasal Swab     Status: None   Collection Time: 12/22/22  2:22 AM   Specimen: Anterior Nasal Swab  Result Value Ref Range Status   SARS Coronavirus 2 by RT PCR NEGATIVE NEGATIVE Final    Comment: (NOTE) SARS-CoV-2 target nucleic acids are NOT DETECTED.  The SARS-CoV-2 RNA is generally detectable in upper and lower respiratory specimens during the acute phase of infection. The lowest concentration of SARS-CoV-2 viral copies this assay can detect is 250 copies / mL. A negative result does not preclude SARS-CoV-2 infection and should not be used as the sole basis for treatment or other patient management decisions.  A negative result may occur with improper specimen collection / handling, submission of specimen other than nasopharyngeal swab, presence of viral mutation(s) within the areas targeted by this assay, and inadequate number of viral copies (<250 copies / mL). A negative result must be combined with clinical observations, patient  history, and epidemiological information.  Fact Sheet for Patients:   RoadLapTop.co.za  Fact Sheet for Healthcare Providers: http://kim-miller.com/  This test is not yet approved or  cleared  by the [place] and has been authorized for detection and/or diagnosis of SARS-CoV-2 by FDA under an Emergency Use Authorization (EUA).  This EUA will remain in effect (meaning this test can be used) for the duration of the COVID-19 declaration under Section 564(b)(1) of the Act, 21 U.S.C. section 360bbb-3(b)(1), unless the authorization is terminated or revoked sooner.  Performed at [facility], [address] [place], [place] [address]   Respiratory (~20 pathogens) panel by PCR     Status: None   Collection Time: 12/22/22  2:22 AM   Specimen: Nasopharyngeal Swab; Respiratory  Result Value Ref Range Status   Adenovirus NOT DETECTED NOT DETECTED Final   Coronavirus 229E NOT DETECTED NOT DETECTED Final    Comment: (NOTE) The Coronavirus on the Respiratory Panel, DOES NOT test for the novel  Coronavirus (2019 nCoV)    Coronavirus HKU1 NOT DETECTED NOT DETECTED Final   Coronavirus NL63 NOT DETECTED NOT DETECTED Final   Coronavirus OC43 NOT DETECTED NOT DETECTED Final   Metapneumovirus NOT DETECTED NOT DETECTED Final   Rhinovirus / Enterovirus NOT DETECTED NOT DETECTED Final   Influenza A NOT DETECTED NOT DETECTED Final   Influenza B NOT DETECTED NOT DETECTED Final   Parainfluenza Virus 1 NOT DETECTED NOT DETECTED Final   Parainfluenza Virus 2 NOT DETECTED NOT DETECTED Final   Parainfluenza Virus 3 NOT DETECTED NOT DETECTED Final   Parainfluenza Virus 4 NOT DETECTED NOT DETECTED Final   Respiratory Syncytial Virus NOT DETECTED NOT DETECTED Final   Bordetella pertussis NOT DETECTED NOT DETECTED Final   Bordetella Parapertussis NOT DETECTED NOT DETECTED Final   Chlamydophila pneumoniae NOT DETECTED NOT DETECTED Final    Mycoplasma pneumoniae NOT DETECTED NOT DETECTED Final    Comment: Performed at [facility], [address] [place], [place] [address]    Labs: CBC: Recent Labs  Lab 12/21/22 1248 12/22/22 0346 12/24/22 0342  WBC 6.6 4.7 5.7  NEUTROABS 4.0  --   --   HGB 11.9* 10.4* 11.7*  HCT 35.5* 32.7* 35.7*  MCV 87.7 90.1 89.7  PLT 181 163 187   Basic Metabolic Panel: Recent Labs  Lab 12/21/22 1248 12/22/22 0346 12/24/22 0342  NA 136 136 134*  K 4.0 3.6 3.4*  CL 101 104 100  CO2 27 24 26   GLUCOSE 95 102* 124*  BUN 9 7* 13  CREATININE 0.81 0.69 0.79  CALCIUM 10.0 9.1 9.3  MG  --   --  2.0   Liver Function Tests: Recent Labs  Lab 12/22/22 0346  AST 19  ALT 14  ALKPHOS 61  BILITOT 0.6  PROT 6.8  ALBUMIN 3.0*   CBG: No results for input(s): "GLUCAP" in the last 168 hours.  Discharge time spent: greater than 30 minutes.  Signed: Thad Ranger, MD [facility] 12/24/2022

## 2022-12-29 ENCOUNTER — Encounter: Payer: Self-pay | Admitting: Adult Health

## 2022-12-29 ENCOUNTER — Ambulatory Visit: Payer: Medicare PPO | Admitting: Adult Health

## 2022-12-29 VITALS — BP 160/85 | HR 81 | Ht 62.0 in | Wt 273.0 lb

## 2022-12-29 DIAGNOSIS — G2581 Restless legs syndrome: Secondary | ICD-10-CM | POA: Diagnosis not present

## 2022-12-29 DIAGNOSIS — I129 Hypertensive chronic kidney disease with stage 1 through stage 4 chronic kidney disease, or unspecified chronic kidney disease: Secondary | ICD-10-CM | POA: Diagnosis not present

## 2022-12-29 DIAGNOSIS — L97821 Non-pressure chronic ulcer of other part of left lower leg limited to breakdown of skin: Secondary | ICD-10-CM | POA: Diagnosis not present

## 2022-12-29 DIAGNOSIS — R569 Unspecified convulsions: Secondary | ICD-10-CM

## 2022-12-29 DIAGNOSIS — L89892 Pressure ulcer of other site, stage 2: Secondary | ICD-10-CM | POA: Diagnosis not present

## 2022-12-29 DIAGNOSIS — Z4800 Encounter for change or removal of nonsurgical wound dressing: Secondary | ICD-10-CM | POA: Diagnosis not present

## 2022-12-29 DIAGNOSIS — G4733 Obstructive sleep apnea (adult) (pediatric): Secondary | ICD-10-CM

## 2022-12-29 DIAGNOSIS — Z5181 Encounter for therapeutic drug level monitoring: Secondary | ICD-10-CM | POA: Diagnosis not present

## 2022-12-29 DIAGNOSIS — L03116 Cellulitis of left lower limb: Secondary | ICD-10-CM | POA: Diagnosis not present

## 2022-12-29 DIAGNOSIS — N182 Chronic kidney disease, stage 2 (mild): Secondary | ICD-10-CM | POA: Diagnosis not present

## 2022-12-29 DIAGNOSIS — I872 Venous insufficiency (chronic) (peripheral): Secondary | ICD-10-CM | POA: Diagnosis not present

## 2022-12-29 DIAGNOSIS — I890 Lymphedema, not elsewhere classified: Secondary | ICD-10-CM | POA: Diagnosis not present

## 2022-12-29 DIAGNOSIS — L03115 Cellulitis of right lower limb: Secondary | ICD-10-CM | POA: Diagnosis not present

## 2022-12-29 MED ORDER — PRAMIPEXOLE DIHYDROCHLORIDE 0.25 MG PO TABS
ORAL_TABLET | ORAL | 3 refills | Status: DC
Start: 2022-12-29 — End: 2023-12-06
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
## 2022-12-29 NOTE — Patient Instructions (Signed)
Your Plan:  Continue Phenobarbital  Continue Mirapex ER and IR tablets Blood work today Take CPAP to DME to be serviced      Thank you for coming to see [place] at [facility]. I hope we have been able to provide you high quality care today.  You may receive a patient satisfaction survey over the next few weeks. We would appreciate your feedback and comments so that we may continue to improve ourselves and the health of our patients.

## 2022-12-30 LAB — CBC WITH DIFFERENTIAL/PLATELET
Basophils Absolute: 0 10*3/uL (ref 0.0–0.2)
Basos: 1 %
EOS (ABSOLUTE): 0.2 10*3/uL (ref 0.0–0.4)
Eos: 2 %
Hematocrit: 40.6 % (ref 34.0–46.6)
Hemoglobin: 13.8 g/dL (ref 11.1–15.9)
Immature Grans (Abs): 0 10*3/uL (ref 0.0–0.1)
Immature Granulocytes: 0 %
Lymphocytes Absolute: 2.8 10*3/uL (ref 0.7–3.1)
Lymphs: 41 %
MCH: 29.1 pg (ref 26.6–33.0)
MCHC: 34 g/dL (ref 31.5–35.7)
MCV: 86 fL (ref 79–97)
Monocytes Absolute: 0.5 10*3/uL (ref 0.1–0.9)
Monocytes: 8 %
Neutrophils Absolute: 3.2 10*3/uL (ref 1.4–7.0)
Neutrophils: 48 %
Platelets: 297 10*3/uL (ref 150–450)
RBC: 4.75 x10E6/uL (ref 3.77–5.28)
RDW: 13.1 % (ref 11.7–15.4)
WBC: 6.7 10*3/uL (ref 3.4–10.8)

## 2022-12-30 LAB — COMPREHENSIVE METABOLIC PANEL
ALT: 38 IU/L — ABNORMAL HIGH (ref 0–32)
AST: 47 IU/L — ABNORMAL HIGH (ref 0–40)
Albumin: 4.3 g/dL (ref 3.8–4.8)
Alkaline Phosphatase: 106 IU/L (ref 44–121)
BUN/Creatinine Ratio: 30 — ABNORMAL HIGH (ref 12–28)
BUN: 33 mg/dL — ABNORMAL HIGH (ref 8–27)
Bilirubin Total: 0.3 mg/dL (ref 0.0–1.2)
CO2: 26 mmol/L (ref 20–29)
Calcium: 10.3 mg/dL (ref 8.7–10.3)
Chloride: 94 mmol/L — ABNORMAL LOW (ref 96–106)
Creatinine, Ser: 1.1 mg/dL — ABNORMAL HIGH (ref 0.57–1.00)
Globulin, Total: 3.9 g/dL (ref 1.5–4.5)
Glucose: 102 mg/dL — ABNORMAL HIGH (ref 70–99)
Potassium: 3.3 mmol/L — ABNORMAL LOW (ref 3.5–5.2)
Sodium: 135 mmol/L (ref 134–144)
Total Protein: 8.2 g/dL (ref 6.0–8.5)
eGFR: 54 mL/min/{1.73_m2} — ABNORMAL LOW (ref >59)

## 2022-12-30 LAB — PHENOBARBITAL LEVEL: Phenobarbital, Serum: 10 ug/mL — ABNORMAL LOW (ref 15–40)

## 2022-12-31 ENCOUNTER — Telehealth: Payer: Self-pay | Admitting: Adult Health

## 2022-12-31 NOTE — Telephone Encounter (Signed)
Pt has called in response to a message she received, asking that she calls Dr Marko Stai office, no further details.  Message sent to POD 3

## 2022-12-31 NOTE — Telephone Encounter (Signed)
Spoke to pt gave labwork results Pt was informed that labwork was sent  to PCP Pt states will f/u with PCP Pt expressed understanding and thanked me for calling

## 2023-01-01 DIAGNOSIS — M1611 Unilateral primary osteoarthritis, right hip: Secondary | ICD-10-CM | POA: Diagnosis not present

## 2023-01-01 DIAGNOSIS — Z6841 Body Mass Index (BMI) 40.0 and over, adult: Secondary | ICD-10-CM | POA: Diagnosis not present

## 2023-01-04 DIAGNOSIS — L03115 Cellulitis of right lower limb: Secondary | ICD-10-CM | POA: Diagnosis not present

## 2023-01-04 DIAGNOSIS — I890 Lymphedema, not elsewhere classified: Secondary | ICD-10-CM | POA: Diagnosis not present

## 2023-01-04 DIAGNOSIS — L97821 Non-pressure chronic ulcer of other part of left lower leg limited to breakdown of skin: Secondary | ICD-10-CM | POA: Diagnosis not present

## 2023-01-04 DIAGNOSIS — L89892 Pressure ulcer of other site, stage 2: Secondary | ICD-10-CM | POA: Diagnosis not present

## 2023-01-04 DIAGNOSIS — L03116 Cellulitis of left lower limb: Secondary | ICD-10-CM | POA: Diagnosis not present

## 2023-01-04 DIAGNOSIS — I129 Hypertensive chronic kidney disease with stage 1 through stage 4 chronic kidney disease, or unspecified chronic kidney disease: Secondary | ICD-10-CM | POA: Diagnosis not present

## 2023-01-04 DIAGNOSIS — I872 Venous insufficiency (chronic) (peripheral): Secondary | ICD-10-CM | POA: Diagnosis not present

## 2023-01-04 DIAGNOSIS — N182 Chronic kidney disease, stage 2 (mild): Secondary | ICD-10-CM | POA: Diagnosis not present

## 2023-01-04 DIAGNOSIS — Z4800 Encounter for change or removal of nonsurgical wound dressing: Secondary | ICD-10-CM | POA: Diagnosis not present

## 2023-01-09 ENCOUNTER — Other Ambulatory Visit: Payer: Self-pay | Admitting: Neurology

## 2023-01-12 DIAGNOSIS — I890 Lymphedema, not elsewhere classified: Secondary | ICD-10-CM | POA: Diagnosis not present

## 2023-01-12 DIAGNOSIS — L03115 Cellulitis of right lower limb: Secondary | ICD-10-CM | POA: Diagnosis not present

## 2023-01-12 DIAGNOSIS — Z6841 Body Mass Index (BMI) 40.0 and over, adult: Secondary | ICD-10-CM | POA: Diagnosis not present

## 2023-01-12 DIAGNOSIS — Z23 Encounter for immunization: Secondary | ICD-10-CM | POA: Diagnosis not present

## 2023-01-12 DIAGNOSIS — L03116 Cellulitis of left lower limb: Secondary | ICD-10-CM | POA: Diagnosis not present

## 2023-01-12 DIAGNOSIS — Z79899 Other long term (current) drug therapy: Secondary | ICD-10-CM | POA: Diagnosis not present

## 2023-01-12 NOTE — Telephone Encounter (Signed)
Last seen on 12/29/22 Follow up scheduled on 12/29/23 Last filled on 12/11/22 #30 tablets (30 day supply) Rx pending to be signed

## 2023-01-13 DIAGNOSIS — L97821 Non-pressure chronic ulcer of other part of left lower leg limited to breakdown of skin: Secondary | ICD-10-CM | POA: Diagnosis not present

## 2023-01-13 DIAGNOSIS — N182 Chronic kidney disease, stage 2 (mild): Secondary | ICD-10-CM | POA: Diagnosis not present

## 2023-01-13 DIAGNOSIS — Z4800 Encounter for change or removal of nonsurgical wound dressing: Secondary | ICD-10-CM | POA: Diagnosis not present

## 2023-01-13 DIAGNOSIS — I129 Hypertensive chronic kidney disease with stage 1 through stage 4 chronic kidney disease, or unspecified chronic kidney disease: Secondary | ICD-10-CM | POA: Diagnosis not present

## 2023-01-13 DIAGNOSIS — I890 Lymphedema, not elsewhere classified: Secondary | ICD-10-CM | POA: Diagnosis not present

## 2023-01-13 DIAGNOSIS — L03116 Cellulitis of left lower limb: Secondary | ICD-10-CM | POA: Diagnosis not present

## 2023-01-13 DIAGNOSIS — L89892 Pressure ulcer of other site, stage 2: Secondary | ICD-10-CM | POA: Diagnosis not present

## 2023-01-13 DIAGNOSIS — I872 Venous insufficiency (chronic) (peripheral): Secondary | ICD-10-CM | POA: Diagnosis not present

## 2023-01-13 DIAGNOSIS — L03115 Cellulitis of right lower limb: Secondary | ICD-10-CM | POA: Diagnosis not present

## 2023-01-18 DIAGNOSIS — I129 Hypertensive chronic kidney disease with stage 1 through stage 4 chronic kidney disease, or unspecified chronic kidney disease: Secondary | ICD-10-CM | POA: Diagnosis not present

## 2023-01-18 DIAGNOSIS — L03116 Cellulitis of left lower limb: Secondary | ICD-10-CM | POA: Diagnosis not present

## 2023-01-18 DIAGNOSIS — N182 Chronic kidney disease, stage 2 (mild): Secondary | ICD-10-CM | POA: Diagnosis not present

## 2023-01-18 DIAGNOSIS — L97821 Non-pressure chronic ulcer of other part of left lower leg limited to breakdown of skin: Secondary | ICD-10-CM | POA: Diagnosis not present

## 2023-01-18 DIAGNOSIS — Z4800 Encounter for change or removal of nonsurgical wound dressing: Secondary | ICD-10-CM | POA: Diagnosis not present

## 2023-01-18 DIAGNOSIS — I872 Venous insufficiency (chronic) (peripheral): Secondary | ICD-10-CM | POA: Diagnosis not present

## 2023-01-18 DIAGNOSIS — L03115 Cellulitis of right lower limb: Secondary | ICD-10-CM | POA: Diagnosis not present

## 2023-01-18 DIAGNOSIS — I890 Lymphedema, not elsewhere classified: Secondary | ICD-10-CM | POA: Diagnosis not present

## 2023-01-18 DIAGNOSIS — L89892 Pressure ulcer of other site, stage 2: Secondary | ICD-10-CM | POA: Diagnosis not present

## 2023-01-22 DIAGNOSIS — N182 Chronic kidney disease, stage 2 (mild): Secondary | ICD-10-CM | POA: Diagnosis not present

## 2023-01-22 DIAGNOSIS — Z4800 Encounter for change or removal of nonsurgical wound dressing: Secondary | ICD-10-CM | POA: Diagnosis not present

## 2023-01-22 DIAGNOSIS — L97821 Non-pressure chronic ulcer of other part of left lower leg limited to breakdown of skin: Secondary | ICD-10-CM | POA: Diagnosis not present

## 2023-01-22 DIAGNOSIS — L03116 Cellulitis of left lower limb: Secondary | ICD-10-CM | POA: Diagnosis not present

## 2023-01-22 DIAGNOSIS — I129 Hypertensive chronic kidney disease with stage 1 through stage 4 chronic kidney disease, or unspecified chronic kidney disease: Secondary | ICD-10-CM | POA: Diagnosis not present

## 2023-01-22 DIAGNOSIS — I872 Venous insufficiency (chronic) (peripheral): Secondary | ICD-10-CM | POA: Diagnosis not present

## 2023-01-22 DIAGNOSIS — I890 Lymphedema, not elsewhere classified: Secondary | ICD-10-CM | POA: Diagnosis not present

## 2023-01-22 DIAGNOSIS — L89892 Pressure ulcer of other site, stage 2: Secondary | ICD-10-CM | POA: Diagnosis not present

## 2023-01-22 DIAGNOSIS — L03115 Cellulitis of right lower limb: Secondary | ICD-10-CM | POA: Diagnosis not present

## 2023-01-24 DIAGNOSIS — Z9181 History of falling: Secondary | ICD-10-CM | POA: Diagnosis not present

## 2023-01-24 DIAGNOSIS — L89892 Pressure ulcer of other site, stage 2: Secondary | ICD-10-CM | POA: Diagnosis not present

## 2023-01-24 DIAGNOSIS — I872 Venous insufficiency (chronic) (peripheral): Secondary | ICD-10-CM | POA: Diagnosis not present

## 2023-01-24 DIAGNOSIS — S71102D Unspecified open wound, left thigh, subsequent encounter: Secondary | ICD-10-CM | POA: Diagnosis not present

## 2023-01-24 DIAGNOSIS — M6281 Muscle weakness (generalized): Secondary | ICD-10-CM | POA: Diagnosis not present

## 2023-01-24 DIAGNOSIS — I890 Lymphedema, not elsewhere classified: Secondary | ICD-10-CM | POA: Diagnosis not present

## 2023-01-24 DIAGNOSIS — G40309 Generalized idiopathic epilepsy and epileptic syndromes, not intractable, without status epilepticus: Secondary | ICD-10-CM | POA: Diagnosis not present

## 2023-01-25 DIAGNOSIS — I890 Lymphedema, not elsewhere classified: Secondary | ICD-10-CM | POA: Diagnosis not present

## 2023-01-25 DIAGNOSIS — L97821 Non-pressure chronic ulcer of other part of left lower leg limited to breakdown of skin: Secondary | ICD-10-CM | POA: Diagnosis not present

## 2023-01-25 DIAGNOSIS — L03116 Cellulitis of left lower limb: Secondary | ICD-10-CM | POA: Diagnosis not present

## 2023-01-25 DIAGNOSIS — L89892 Pressure ulcer of other site, stage 2: Secondary | ICD-10-CM | POA: Diagnosis not present

## 2023-01-25 DIAGNOSIS — I872 Venous insufficiency (chronic) (peripheral): Secondary | ICD-10-CM | POA: Diagnosis not present

## 2023-01-25 DIAGNOSIS — N182 Chronic kidney disease, stage 2 (mild): Secondary | ICD-10-CM | POA: Diagnosis not present

## 2023-01-25 DIAGNOSIS — Z4800 Encounter for change or removal of nonsurgical wound dressing: Secondary | ICD-10-CM | POA: Diagnosis not present

## 2023-01-25 DIAGNOSIS — I129 Hypertensive chronic kidney disease with stage 1 through stage 4 chronic kidney disease, or unspecified chronic kidney disease: Secondary | ICD-10-CM | POA: Diagnosis not present

## 2023-01-25 DIAGNOSIS — L03115 Cellulitis of right lower limb: Secondary | ICD-10-CM | POA: Diagnosis not present

## 2023-01-26 DIAGNOSIS — L97821 Non-pressure chronic ulcer of other part of left lower leg limited to breakdown of skin: Secondary | ICD-10-CM | POA: Diagnosis not present

## 2023-01-26 DIAGNOSIS — N182 Chronic kidney disease, stage 2 (mild): Secondary | ICD-10-CM | POA: Diagnosis not present

## 2023-01-26 DIAGNOSIS — L03115 Cellulitis of right lower limb: Secondary | ICD-10-CM | POA: Diagnosis not present

## 2023-01-26 DIAGNOSIS — L89892 Pressure ulcer of other site, stage 2: Secondary | ICD-10-CM | POA: Diagnosis not present

## 2023-01-26 DIAGNOSIS — I872 Venous insufficiency (chronic) (peripheral): Secondary | ICD-10-CM | POA: Diagnosis not present

## 2023-01-26 DIAGNOSIS — Z4800 Encounter for change or removal of nonsurgical wound dressing: Secondary | ICD-10-CM | POA: Diagnosis not present

## 2023-01-26 DIAGNOSIS — I890 Lymphedema, not elsewhere classified: Secondary | ICD-10-CM | POA: Diagnosis not present

## 2023-01-26 DIAGNOSIS — L03116 Cellulitis of left lower limb: Secondary | ICD-10-CM | POA: Diagnosis not present

## 2023-01-26 DIAGNOSIS — I129 Hypertensive chronic kidney disease with stage 1 through stage 4 chronic kidney disease, or unspecified chronic kidney disease: Secondary | ICD-10-CM | POA: Diagnosis not present

## 2023-01-27 DIAGNOSIS — I129 Hypertensive chronic kidney disease with stage 1 through stage 4 chronic kidney disease, or unspecified chronic kidney disease: Secondary | ICD-10-CM | POA: Diagnosis not present

## 2023-01-27 DIAGNOSIS — N182 Chronic kidney disease, stage 2 (mild): Secondary | ICD-10-CM | POA: Diagnosis not present

## 2023-01-27 DIAGNOSIS — L03115 Cellulitis of right lower limb: Secondary | ICD-10-CM | POA: Diagnosis not present

## 2023-01-27 DIAGNOSIS — I872 Venous insufficiency (chronic) (peripheral): Secondary | ICD-10-CM | POA: Diagnosis not present

## 2023-01-27 DIAGNOSIS — Z4800 Encounter for change or removal of nonsurgical wound dressing: Secondary | ICD-10-CM | POA: Diagnosis not present

## 2023-01-27 DIAGNOSIS — L03116 Cellulitis of left lower limb: Secondary | ICD-10-CM | POA: Diagnosis not present

## 2023-01-27 DIAGNOSIS — L89892 Pressure ulcer of other site, stage 2: Secondary | ICD-10-CM | POA: Diagnosis not present

## 2023-01-27 DIAGNOSIS — L97821 Non-pressure chronic ulcer of other part of left lower leg limited to breakdown of skin: Secondary | ICD-10-CM | POA: Diagnosis not present

## 2023-01-27 DIAGNOSIS — I890 Lymphedema, not elsewhere classified: Secondary | ICD-10-CM | POA: Diagnosis not present

## 2023-01-28 DIAGNOSIS — I890 Lymphedema, not elsewhere classified: Secondary | ICD-10-CM | POA: Diagnosis not present

## 2023-01-28 DIAGNOSIS — L97821 Non-pressure chronic ulcer of other part of left lower leg limited to breakdown of skin: Secondary | ICD-10-CM | POA: Diagnosis not present

## 2023-01-28 DIAGNOSIS — N182 Chronic kidney disease, stage 2 (mild): Secondary | ICD-10-CM | POA: Diagnosis not present

## 2023-01-28 DIAGNOSIS — Z4800 Encounter for change or removal of nonsurgical wound dressing: Secondary | ICD-10-CM | POA: Diagnosis not present

## 2023-01-28 DIAGNOSIS — L03115 Cellulitis of right lower limb: Secondary | ICD-10-CM | POA: Diagnosis not present

## 2023-01-28 DIAGNOSIS — I872 Venous insufficiency (chronic) (peripheral): Secondary | ICD-10-CM | POA: Diagnosis not present

## 2023-01-28 DIAGNOSIS — I129 Hypertensive chronic kidney disease with stage 1 through stage 4 chronic kidney disease, or unspecified chronic kidney disease: Secondary | ICD-10-CM | POA: Diagnosis not present

## 2023-01-28 DIAGNOSIS — L03116 Cellulitis of left lower limb: Secondary | ICD-10-CM | POA: Diagnosis not present

## 2023-01-28 DIAGNOSIS — L89892 Pressure ulcer of other site, stage 2: Secondary | ICD-10-CM | POA: Diagnosis not present

## 2023-02-02 DIAGNOSIS — N182 Chronic kidney disease, stage 2 (mild): Secondary | ICD-10-CM | POA: Diagnosis not present

## 2023-02-02 DIAGNOSIS — L03116 Cellulitis of left lower limb: Secondary | ICD-10-CM | POA: Diagnosis not present

## 2023-02-02 DIAGNOSIS — I129 Hypertensive chronic kidney disease with stage 1 through stage 4 chronic kidney disease, or unspecified chronic kidney disease: Secondary | ICD-10-CM | POA: Diagnosis not present

## 2023-02-02 DIAGNOSIS — L97821 Non-pressure chronic ulcer of other part of left lower leg limited to breakdown of skin: Secondary | ICD-10-CM | POA: Diagnosis not present

## 2023-02-02 DIAGNOSIS — I890 Lymphedema, not elsewhere classified: Secondary | ICD-10-CM | POA: Diagnosis not present

## 2023-02-02 DIAGNOSIS — L03115 Cellulitis of right lower limb: Secondary | ICD-10-CM | POA: Diagnosis not present

## 2023-02-02 DIAGNOSIS — L89892 Pressure ulcer of other site, stage 2: Secondary | ICD-10-CM | POA: Diagnosis not present

## 2023-02-02 DIAGNOSIS — I872 Venous insufficiency (chronic) (peripheral): Secondary | ICD-10-CM | POA: Diagnosis not present

## 2023-02-02 DIAGNOSIS — Z4800 Encounter for change or removal of nonsurgical wound dressing: Secondary | ICD-10-CM | POA: Diagnosis not present

## 2023-02-03 DIAGNOSIS — L97821 Non-pressure chronic ulcer of other part of left lower leg limited to breakdown of skin: Secondary | ICD-10-CM | POA: Diagnosis not present

## 2023-02-03 DIAGNOSIS — I890 Lymphedema, not elsewhere classified: Secondary | ICD-10-CM | POA: Diagnosis not present

## 2023-02-03 DIAGNOSIS — I872 Venous insufficiency (chronic) (peripheral): Secondary | ICD-10-CM | POA: Diagnosis not present

## 2023-02-03 DIAGNOSIS — L03116 Cellulitis of left lower limb: Secondary | ICD-10-CM | POA: Diagnosis not present

## 2023-02-03 DIAGNOSIS — Z4800 Encounter for change or removal of nonsurgical wound dressing: Secondary | ICD-10-CM | POA: Diagnosis not present

## 2023-02-03 DIAGNOSIS — N182 Chronic kidney disease, stage 2 (mild): Secondary | ICD-10-CM | POA: Diagnosis not present

## 2023-02-03 DIAGNOSIS — I129 Hypertensive chronic kidney disease with stage 1 through stage 4 chronic kidney disease, or unspecified chronic kidney disease: Secondary | ICD-10-CM | POA: Diagnosis not present

## 2023-02-03 DIAGNOSIS — L89892 Pressure ulcer of other site, stage 2: Secondary | ICD-10-CM | POA: Diagnosis not present

## 2023-02-03 DIAGNOSIS — L03115 Cellulitis of right lower limb: Secondary | ICD-10-CM | POA: Diagnosis not present

## 2023-02-04 DIAGNOSIS — I129 Hypertensive chronic kidney disease with stage 1 through stage 4 chronic kidney disease, or unspecified chronic kidney disease: Secondary | ICD-10-CM | POA: Diagnosis not present

## 2023-02-04 DIAGNOSIS — L03116 Cellulitis of left lower limb: Secondary | ICD-10-CM | POA: Diagnosis not present

## 2023-02-04 DIAGNOSIS — L03115 Cellulitis of right lower limb: Secondary | ICD-10-CM | POA: Diagnosis not present

## 2023-02-04 DIAGNOSIS — L89892 Pressure ulcer of other site, stage 2: Secondary | ICD-10-CM | POA: Diagnosis not present

## 2023-02-04 DIAGNOSIS — N182 Chronic kidney disease, stage 2 (mild): Secondary | ICD-10-CM | POA: Diagnosis not present

## 2023-02-04 DIAGNOSIS — I890 Lymphedema, not elsewhere classified: Secondary | ICD-10-CM | POA: Diagnosis not present

## 2023-02-04 DIAGNOSIS — L97821 Non-pressure chronic ulcer of other part of left lower leg limited to breakdown of skin: Secondary | ICD-10-CM | POA: Diagnosis not present

## 2023-02-04 DIAGNOSIS — Z4800 Encounter for change or removal of nonsurgical wound dressing: Secondary | ICD-10-CM | POA: Diagnosis not present

## 2023-02-04 DIAGNOSIS — I872 Venous insufficiency (chronic) (peripheral): Secondary | ICD-10-CM | POA: Diagnosis not present

## 2023-02-05 DIAGNOSIS — N182 Chronic kidney disease, stage 2 (mild): Secondary | ICD-10-CM | POA: Diagnosis not present

## 2023-02-05 DIAGNOSIS — I129 Hypertensive chronic kidney disease with stage 1 through stage 4 chronic kidney disease, or unspecified chronic kidney disease: Secondary | ICD-10-CM | POA: Diagnosis not present

## 2023-02-05 DIAGNOSIS — L03115 Cellulitis of right lower limb: Secondary | ICD-10-CM | POA: Diagnosis not present

## 2023-02-05 DIAGNOSIS — L89892 Pressure ulcer of other site, stage 2: Secondary | ICD-10-CM | POA: Diagnosis not present

## 2023-02-05 DIAGNOSIS — I890 Lymphedema, not elsewhere classified: Secondary | ICD-10-CM | POA: Diagnosis not present

## 2023-02-05 DIAGNOSIS — L97821 Non-pressure chronic ulcer of other part of left lower leg limited to breakdown of skin: Secondary | ICD-10-CM | POA: Diagnosis not present

## 2023-02-05 DIAGNOSIS — I872 Venous insufficiency (chronic) (peripheral): Secondary | ICD-10-CM | POA: Diagnosis not present

## 2023-02-05 DIAGNOSIS — Z4800 Encounter for change or removal of nonsurgical wound dressing: Secondary | ICD-10-CM | POA: Diagnosis not present

## 2023-02-05 DIAGNOSIS — L03116 Cellulitis of left lower limb: Secondary | ICD-10-CM | POA: Diagnosis not present

## 2023-02-09 DIAGNOSIS — L03116 Cellulitis of left lower limb: Secondary | ICD-10-CM | POA: Diagnosis not present

## 2023-02-09 DIAGNOSIS — I872 Venous insufficiency (chronic) (peripheral): Secondary | ICD-10-CM | POA: Diagnosis not present

## 2023-02-09 DIAGNOSIS — L97821 Non-pressure chronic ulcer of other part of left lower leg limited to breakdown of skin: Secondary | ICD-10-CM | POA: Diagnosis not present

## 2023-02-09 DIAGNOSIS — L03115 Cellulitis of right lower limb: Secondary | ICD-10-CM | POA: Diagnosis not present

## 2023-02-09 DIAGNOSIS — I890 Lymphedema, not elsewhere classified: Secondary | ICD-10-CM | POA: Diagnosis not present

## 2023-02-09 DIAGNOSIS — Z4800 Encounter for change or removal of nonsurgical wound dressing: Secondary | ICD-10-CM | POA: Diagnosis not present

## 2023-02-09 DIAGNOSIS — L89892 Pressure ulcer of other site, stage 2: Secondary | ICD-10-CM | POA: Diagnosis not present

## 2023-02-09 DIAGNOSIS — I129 Hypertensive chronic kidney disease with stage 1 through stage 4 chronic kidney disease, or unspecified chronic kidney disease: Secondary | ICD-10-CM | POA: Diagnosis not present

## 2023-02-09 DIAGNOSIS — N182 Chronic kidney disease, stage 2 (mild): Secondary | ICD-10-CM | POA: Diagnosis not present

## 2023-02-10 DIAGNOSIS — I890 Lymphedema, not elsewhere classified: Secondary | ICD-10-CM | POA: Diagnosis not present

## 2023-02-10 DIAGNOSIS — N182 Chronic kidney disease, stage 2 (mild): Secondary | ICD-10-CM | POA: Diagnosis not present

## 2023-02-10 DIAGNOSIS — I129 Hypertensive chronic kidney disease with stage 1 through stage 4 chronic kidney disease, or unspecified chronic kidney disease: Secondary | ICD-10-CM | POA: Diagnosis not present

## 2023-02-10 DIAGNOSIS — L97821 Non-pressure chronic ulcer of other part of left lower leg limited to breakdown of skin: Secondary | ICD-10-CM | POA: Diagnosis not present

## 2023-02-10 DIAGNOSIS — L03115 Cellulitis of right lower limb: Secondary | ICD-10-CM | POA: Diagnosis not present

## 2023-02-10 DIAGNOSIS — I872 Venous insufficiency (chronic) (peripheral): Secondary | ICD-10-CM | POA: Diagnosis not present

## 2023-02-10 DIAGNOSIS — Z4800 Encounter for change or removal of nonsurgical wound dressing: Secondary | ICD-10-CM | POA: Diagnosis not present

## 2023-02-10 DIAGNOSIS — L03116 Cellulitis of left lower limb: Secondary | ICD-10-CM | POA: Diagnosis not present

## 2023-02-10 DIAGNOSIS — L89892 Pressure ulcer of other site, stage 2: Secondary | ICD-10-CM | POA: Diagnosis not present

## 2023-02-16 DIAGNOSIS — I890 Lymphedema, not elsewhere classified: Secondary | ICD-10-CM | POA: Diagnosis not present

## 2023-02-16 DIAGNOSIS — N182 Chronic kidney disease, stage 2 (mild): Secondary | ICD-10-CM | POA: Diagnosis not present

## 2023-02-16 DIAGNOSIS — L97821 Non-pressure chronic ulcer of other part of left lower leg limited to breakdown of skin: Secondary | ICD-10-CM | POA: Diagnosis not present

## 2023-02-16 DIAGNOSIS — Z4800 Encounter for change or removal of nonsurgical wound dressing: Secondary | ICD-10-CM | POA: Diagnosis not present

## 2023-02-16 DIAGNOSIS — I872 Venous insufficiency (chronic) (peripheral): Secondary | ICD-10-CM | POA: Diagnosis not present

## 2023-02-16 DIAGNOSIS — L03116 Cellulitis of left lower limb: Secondary | ICD-10-CM | POA: Diagnosis not present

## 2023-02-16 DIAGNOSIS — L89892 Pressure ulcer of other site, stage 2: Secondary | ICD-10-CM | POA: Diagnosis not present

## 2023-02-16 DIAGNOSIS — I129 Hypertensive chronic kidney disease with stage 1 through stage 4 chronic kidney disease, or unspecified chronic kidney disease: Secondary | ICD-10-CM | POA: Diagnosis not present

## 2023-02-16 DIAGNOSIS — L03115 Cellulitis of right lower limb: Secondary | ICD-10-CM | POA: Diagnosis not present

## 2023-02-17 DIAGNOSIS — L03115 Cellulitis of right lower limb: Secondary | ICD-10-CM | POA: Diagnosis not present

## 2023-02-17 DIAGNOSIS — L97821 Non-pressure chronic ulcer of other part of left lower leg limited to breakdown of skin: Secondary | ICD-10-CM | POA: Diagnosis not present

## 2023-02-17 DIAGNOSIS — I129 Hypertensive chronic kidney disease with stage 1 through stage 4 chronic kidney disease, or unspecified chronic kidney disease: Secondary | ICD-10-CM | POA: Diagnosis not present

## 2023-02-17 DIAGNOSIS — Z4800 Encounter for change or removal of nonsurgical wound dressing: Secondary | ICD-10-CM | POA: Diagnosis not present

## 2023-02-17 DIAGNOSIS — L89892 Pressure ulcer of other site, stage 2: Secondary | ICD-10-CM | POA: Diagnosis not present

## 2023-02-17 DIAGNOSIS — I890 Lymphedema, not elsewhere classified: Secondary | ICD-10-CM | POA: Diagnosis not present

## 2023-02-17 DIAGNOSIS — I872 Venous insufficiency (chronic) (peripheral): Secondary | ICD-10-CM | POA: Diagnosis not present

## 2023-02-17 DIAGNOSIS — L03116 Cellulitis of left lower limb: Secondary | ICD-10-CM | POA: Diagnosis not present

## 2023-02-17 DIAGNOSIS — N182 Chronic kidney disease, stage 2 (mild): Secondary | ICD-10-CM | POA: Diagnosis not present

## 2023-02-18 DIAGNOSIS — L03115 Cellulitis of right lower limb: Secondary | ICD-10-CM | POA: Diagnosis not present

## 2023-02-18 DIAGNOSIS — I890 Lymphedema, not elsewhere classified: Secondary | ICD-10-CM | POA: Diagnosis not present

## 2023-02-18 DIAGNOSIS — L97821 Non-pressure chronic ulcer of other part of left lower leg limited to breakdown of skin: Secondary | ICD-10-CM | POA: Diagnosis not present

## 2023-02-18 DIAGNOSIS — I872 Venous insufficiency (chronic) (peripheral): Secondary | ICD-10-CM | POA: Diagnosis not present

## 2023-02-18 DIAGNOSIS — L89892 Pressure ulcer of other site, stage 2: Secondary | ICD-10-CM | POA: Diagnosis not present

## 2023-02-18 DIAGNOSIS — I129 Hypertensive chronic kidney disease with stage 1 through stage 4 chronic kidney disease, or unspecified chronic kidney disease: Secondary | ICD-10-CM | POA: Diagnosis not present

## 2023-02-18 DIAGNOSIS — Z4800 Encounter for change or removal of nonsurgical wound dressing: Secondary | ICD-10-CM | POA: Diagnosis not present

## 2023-02-18 DIAGNOSIS — L03116 Cellulitis of left lower limb: Secondary | ICD-10-CM | POA: Diagnosis not present

## 2023-02-18 DIAGNOSIS — N182 Chronic kidney disease, stage 2 (mild): Secondary | ICD-10-CM | POA: Diagnosis not present

## 2023-02-23 DIAGNOSIS — Z4800 Encounter for change or removal of nonsurgical wound dressing: Secondary | ICD-10-CM | POA: Diagnosis not present

## 2023-02-23 DIAGNOSIS — S71102D Unspecified open wound, left thigh, subsequent encounter: Secondary | ICD-10-CM | POA: Diagnosis not present

## 2023-02-23 DIAGNOSIS — L03116 Cellulitis of left lower limb: Secondary | ICD-10-CM | POA: Diagnosis not present

## 2023-02-23 DIAGNOSIS — I872 Venous insufficiency (chronic) (peripheral): Secondary | ICD-10-CM | POA: Diagnosis not present

## 2023-02-23 DIAGNOSIS — I129 Hypertensive chronic kidney disease with stage 1 through stage 4 chronic kidney disease, or unspecified chronic kidney disease: Secondary | ICD-10-CM | POA: Diagnosis not present

## 2023-02-23 DIAGNOSIS — L03115 Cellulitis of right lower limb: Secondary | ICD-10-CM | POA: Diagnosis not present

## 2023-02-23 DIAGNOSIS — N182 Chronic kidney disease, stage 2 (mild): Secondary | ICD-10-CM | POA: Diagnosis not present

## 2023-02-23 DIAGNOSIS — Z9181 History of falling: Secondary | ICD-10-CM | POA: Diagnosis not present

## 2023-02-23 DIAGNOSIS — L89892 Pressure ulcer of other site, stage 2: Secondary | ICD-10-CM | POA: Diagnosis not present

## 2023-02-23 DIAGNOSIS — G40309 Generalized idiopathic epilepsy and epileptic syndromes, not intractable, without status epilepticus: Secondary | ICD-10-CM | POA: Diagnosis not present

## 2023-02-23 DIAGNOSIS — L97821 Non-pressure chronic ulcer of other part of left lower leg limited to breakdown of skin: Secondary | ICD-10-CM | POA: Diagnosis not present

## 2023-02-23 DIAGNOSIS — M6281 Muscle weakness (generalized): Secondary | ICD-10-CM | POA: Diagnosis not present

## 2023-02-23 DIAGNOSIS — I890 Lymphedema, not elsewhere classified: Secondary | ICD-10-CM | POA: Diagnosis not present

## 2023-02-25 DIAGNOSIS — L03116 Cellulitis of left lower limb: Secondary | ICD-10-CM | POA: Diagnosis not present

## 2023-02-25 DIAGNOSIS — L89892 Pressure ulcer of other site, stage 2: Secondary | ICD-10-CM | POA: Diagnosis not present

## 2023-02-25 DIAGNOSIS — L03115 Cellulitis of right lower limb: Secondary | ICD-10-CM | POA: Diagnosis not present

## 2023-02-25 DIAGNOSIS — Z4800 Encounter for change or removal of nonsurgical wound dressing: Secondary | ICD-10-CM | POA: Diagnosis not present

## 2023-02-25 DIAGNOSIS — I129 Hypertensive chronic kidney disease with stage 1 through stage 4 chronic kidney disease, or unspecified chronic kidney disease: Secondary | ICD-10-CM | POA: Diagnosis not present

## 2023-02-25 DIAGNOSIS — L97821 Non-pressure chronic ulcer of other part of left lower leg limited to breakdown of skin: Secondary | ICD-10-CM | POA: Diagnosis not present

## 2023-02-25 DIAGNOSIS — I872 Venous insufficiency (chronic) (peripheral): Secondary | ICD-10-CM | POA: Diagnosis not present

## 2023-02-25 DIAGNOSIS — I890 Lymphedema, not elsewhere classified: Secondary | ICD-10-CM | POA: Diagnosis not present

## 2023-02-25 DIAGNOSIS — N182 Chronic kidney disease, stage 2 (mild): Secondary | ICD-10-CM | POA: Diagnosis not present

## 2023-02-27 DIAGNOSIS — L03115 Cellulitis of right lower limb: Secondary | ICD-10-CM | POA: Diagnosis not present

## 2023-02-27 DIAGNOSIS — L97821 Non-pressure chronic ulcer of other part of left lower leg limited to breakdown of skin: Secondary | ICD-10-CM | POA: Diagnosis not present

## 2023-02-27 DIAGNOSIS — L89892 Pressure ulcer of other site, stage 2: Secondary | ICD-10-CM | POA: Diagnosis not present

## 2023-02-27 DIAGNOSIS — L97418 Non-pressure chronic ulcer of right heel and midfoot with other specified severity: Secondary | ICD-10-CM | POA: Diagnosis not present

## 2023-02-27 DIAGNOSIS — L03116 Cellulitis of left lower limb: Secondary | ICD-10-CM | POA: Diagnosis not present

## 2023-02-27 DIAGNOSIS — Z4800 Encounter for change or removal of nonsurgical wound dressing: Secondary | ICD-10-CM | POA: Diagnosis not present

## 2023-02-27 DIAGNOSIS — I890 Lymphedema, not elsewhere classified: Secondary | ICD-10-CM | POA: Diagnosis not present

## 2023-02-27 DIAGNOSIS — I872 Venous insufficiency (chronic) (peripheral): Secondary | ICD-10-CM | POA: Diagnosis not present

## 2023-02-27 DIAGNOSIS — I129 Hypertensive chronic kidney disease with stage 1 through stage 4 chronic kidney disease, or unspecified chronic kidney disease: Secondary | ICD-10-CM | POA: Diagnosis not present

## 2023-03-01 DIAGNOSIS — I872 Venous insufficiency (chronic) (peripheral): Secondary | ICD-10-CM | POA: Diagnosis not present

## 2023-03-01 DIAGNOSIS — I890 Lymphedema, not elsewhere classified: Secondary | ICD-10-CM | POA: Diagnosis not present

## 2023-03-01 DIAGNOSIS — Z4800 Encounter for change or removal of nonsurgical wound dressing: Secondary | ICD-10-CM | POA: Diagnosis not present

## 2023-03-01 DIAGNOSIS — L03115 Cellulitis of right lower limb: Secondary | ICD-10-CM | POA: Diagnosis not present

## 2023-03-01 DIAGNOSIS — L97418 Non-pressure chronic ulcer of right heel and midfoot with other specified severity: Secondary | ICD-10-CM | POA: Diagnosis not present

## 2023-03-01 DIAGNOSIS — L89892 Pressure ulcer of other site, stage 2: Secondary | ICD-10-CM | POA: Diagnosis not present

## 2023-03-01 DIAGNOSIS — L97821 Non-pressure chronic ulcer of other part of left lower leg limited to breakdown of skin: Secondary | ICD-10-CM | POA: Diagnosis not present

## 2023-03-01 DIAGNOSIS — L03116 Cellulitis of left lower limb: Secondary | ICD-10-CM | POA: Diagnosis not present

## 2023-03-01 DIAGNOSIS — I129 Hypertensive chronic kidney disease with stage 1 through stage 4 chronic kidney disease, or unspecified chronic kidney disease: Secondary | ICD-10-CM | POA: Diagnosis not present

## 2023-03-04 DIAGNOSIS — M1611 Unilateral primary osteoarthritis, right hip: Secondary | ICD-10-CM | POA: Diagnosis not present

## 2023-03-04 DIAGNOSIS — M47896 Other spondylosis, lumbar region: Secondary | ICD-10-CM | POA: Diagnosis not present

## 2023-03-04 DIAGNOSIS — L03116 Cellulitis of left lower limb: Secondary | ICD-10-CM | POA: Diagnosis not present

## 2023-03-04 DIAGNOSIS — M545 Low back pain, unspecified: Secondary | ICD-10-CM | POA: Diagnosis not present

## 2023-03-05 DIAGNOSIS — I129 Hypertensive chronic kidney disease with stage 1 through stage 4 chronic kidney disease, or unspecified chronic kidney disease: Secondary | ICD-10-CM | POA: Diagnosis not present

## 2023-03-05 DIAGNOSIS — L03116 Cellulitis of left lower limb: Secondary | ICD-10-CM | POA: Diagnosis not present

## 2023-03-05 DIAGNOSIS — I872 Venous insufficiency (chronic) (peripheral): Secondary | ICD-10-CM | POA: Diagnosis not present

## 2023-03-05 DIAGNOSIS — L89892 Pressure ulcer of other site, stage 2: Secondary | ICD-10-CM | POA: Diagnosis not present

## 2023-03-05 DIAGNOSIS — L03115 Cellulitis of right lower limb: Secondary | ICD-10-CM | POA: Diagnosis not present

## 2023-03-05 DIAGNOSIS — L97821 Non-pressure chronic ulcer of other part of left lower leg limited to breakdown of skin: Secondary | ICD-10-CM | POA: Diagnosis not present

## 2023-03-05 DIAGNOSIS — I890 Lymphedema, not elsewhere classified: Secondary | ICD-10-CM | POA: Diagnosis not present

## 2023-03-05 DIAGNOSIS — L97418 Non-pressure chronic ulcer of right heel and midfoot with other specified severity: Secondary | ICD-10-CM | POA: Diagnosis not present

## 2023-03-05 DIAGNOSIS — Z4800 Encounter for change or removal of nonsurgical wound dressing: Secondary | ICD-10-CM | POA: Diagnosis not present

## 2023-03-08 DIAGNOSIS — L97821 Non-pressure chronic ulcer of other part of left lower leg limited to breakdown of skin: Secondary | ICD-10-CM | POA: Diagnosis not present

## 2023-03-08 DIAGNOSIS — L89892 Pressure ulcer of other site, stage 2: Secondary | ICD-10-CM | POA: Diagnosis not present

## 2023-03-08 DIAGNOSIS — I890 Lymphedema, not elsewhere classified: Secondary | ICD-10-CM | POA: Diagnosis not present

## 2023-03-08 DIAGNOSIS — L03115 Cellulitis of right lower limb: Secondary | ICD-10-CM | POA: Diagnosis not present

## 2023-03-08 DIAGNOSIS — I129 Hypertensive chronic kidney disease with stage 1 through stage 4 chronic kidney disease, or unspecified chronic kidney disease: Secondary | ICD-10-CM | POA: Diagnosis not present

## 2023-03-08 DIAGNOSIS — I872 Venous insufficiency (chronic) (peripheral): Secondary | ICD-10-CM | POA: Diagnosis not present

## 2023-03-08 DIAGNOSIS — Z4800 Encounter for change or removal of nonsurgical wound dressing: Secondary | ICD-10-CM | POA: Diagnosis not present

## 2023-03-08 DIAGNOSIS — L97418 Non-pressure chronic ulcer of right heel and midfoot with other specified severity: Secondary | ICD-10-CM | POA: Diagnosis not present

## 2023-03-08 DIAGNOSIS — L03116 Cellulitis of left lower limb: Secondary | ICD-10-CM | POA: Diagnosis not present

## 2023-03-09 DIAGNOSIS — I872 Venous insufficiency (chronic) (peripheral): Secondary | ICD-10-CM | POA: Diagnosis not present

## 2023-03-09 DIAGNOSIS — I890 Lymphedema, not elsewhere classified: Secondary | ICD-10-CM | POA: Diagnosis not present

## 2023-03-09 DIAGNOSIS — L89892 Pressure ulcer of other site, stage 2: Secondary | ICD-10-CM | POA: Diagnosis not present

## 2023-03-09 DIAGNOSIS — Z4800 Encounter for change or removal of nonsurgical wound dressing: Secondary | ICD-10-CM | POA: Diagnosis not present

## 2023-03-09 DIAGNOSIS — L97418 Non-pressure chronic ulcer of right heel and midfoot with other specified severity: Secondary | ICD-10-CM | POA: Diagnosis not present

## 2023-03-09 DIAGNOSIS — L97821 Non-pressure chronic ulcer of other part of left lower leg limited to breakdown of skin: Secondary | ICD-10-CM | POA: Diagnosis not present

## 2023-03-09 DIAGNOSIS — L03116 Cellulitis of left lower limb: Secondary | ICD-10-CM | POA: Diagnosis not present

## 2023-03-09 DIAGNOSIS — G4733 Obstructive sleep apnea (adult) (pediatric): Secondary | ICD-10-CM | POA: Diagnosis not present

## 2023-03-09 DIAGNOSIS — I129 Hypertensive chronic kidney disease with stage 1 through stage 4 chronic kidney disease, or unspecified chronic kidney disease: Secondary | ICD-10-CM | POA: Diagnosis not present

## 2023-03-09 DIAGNOSIS — L03115 Cellulitis of right lower limb: Secondary | ICD-10-CM | POA: Diagnosis not present

## 2023-03-12 ENCOUNTER — Other Ambulatory Visit: Payer: Self-pay | Admitting: Cardiology

## 2023-03-12 DIAGNOSIS — Z4800 Encounter for change or removal of nonsurgical wound dressing: Secondary | ICD-10-CM | POA: Diagnosis not present

## 2023-03-12 DIAGNOSIS — I890 Lymphedema, not elsewhere classified: Secondary | ICD-10-CM | POA: Diagnosis not present

## 2023-03-12 DIAGNOSIS — L03116 Cellulitis of left lower limb: Secondary | ICD-10-CM | POA: Diagnosis not present

## 2023-03-12 DIAGNOSIS — I129 Hypertensive chronic kidney disease with stage 1 through stage 4 chronic kidney disease, or unspecified chronic kidney disease: Secondary | ICD-10-CM | POA: Diagnosis not present

## 2023-03-12 DIAGNOSIS — L03115 Cellulitis of right lower limb: Secondary | ICD-10-CM | POA: Diagnosis not present

## 2023-03-12 DIAGNOSIS — L89892 Pressure ulcer of other site, stage 2: Secondary | ICD-10-CM | POA: Diagnosis not present

## 2023-03-12 DIAGNOSIS — L97418 Non-pressure chronic ulcer of right heel and midfoot with other specified severity: Secondary | ICD-10-CM | POA: Diagnosis not present

## 2023-03-12 DIAGNOSIS — I872 Venous insufficiency (chronic) (peripheral): Secondary | ICD-10-CM | POA: Diagnosis not present

## 2023-03-12 DIAGNOSIS — L97821 Non-pressure chronic ulcer of other part of left lower leg limited to breakdown of skin: Secondary | ICD-10-CM | POA: Diagnosis not present

## 2023-03-16 ENCOUNTER — Other Ambulatory Visit: Payer: Self-pay | Admitting: Neurology

## 2023-03-16 DIAGNOSIS — G2581 Restless legs syndrome: Secondary | ICD-10-CM

## 2023-03-17 DIAGNOSIS — I890 Lymphedema, not elsewhere classified: Secondary | ICD-10-CM | POA: Diagnosis not present

## 2023-03-17 DIAGNOSIS — L89892 Pressure ulcer of other site, stage 2: Secondary | ICD-10-CM | POA: Diagnosis not present

## 2023-03-17 DIAGNOSIS — I129 Hypertensive chronic kidney disease with stage 1 through stage 4 chronic kidney disease, or unspecified chronic kidney disease: Secondary | ICD-10-CM | POA: Diagnosis not present

## 2023-03-17 DIAGNOSIS — I872 Venous insufficiency (chronic) (peripheral): Secondary | ICD-10-CM | POA: Diagnosis not present

## 2023-03-17 DIAGNOSIS — L03116 Cellulitis of left lower limb: Secondary | ICD-10-CM | POA: Diagnosis not present

## 2023-03-17 DIAGNOSIS — L97418 Non-pressure chronic ulcer of right heel and midfoot with other specified severity: Secondary | ICD-10-CM | POA: Diagnosis not present

## 2023-03-17 DIAGNOSIS — Z4800 Encounter for change or removal of nonsurgical wound dressing: Secondary | ICD-10-CM | POA: Diagnosis not present

## 2023-03-17 DIAGNOSIS — L97821 Non-pressure chronic ulcer of other part of left lower leg limited to breakdown of skin: Secondary | ICD-10-CM | POA: Diagnosis not present

## 2023-03-17 DIAGNOSIS — L03115 Cellulitis of right lower limb: Secondary | ICD-10-CM | POA: Diagnosis not present

## 2023-03-18 DIAGNOSIS — Z4800 Encounter for change or removal of nonsurgical wound dressing: Secondary | ICD-10-CM | POA: Diagnosis not present

## 2023-03-18 DIAGNOSIS — L97418 Non-pressure chronic ulcer of right heel and midfoot with other specified severity: Secondary | ICD-10-CM | POA: Diagnosis not present

## 2023-03-18 DIAGNOSIS — I129 Hypertensive chronic kidney disease with stage 1 through stage 4 chronic kidney disease, or unspecified chronic kidney disease: Secondary | ICD-10-CM | POA: Diagnosis not present

## 2023-03-18 DIAGNOSIS — L89892 Pressure ulcer of other site, stage 2: Secondary | ICD-10-CM | POA: Diagnosis not present

## 2023-03-18 DIAGNOSIS — L03115 Cellulitis of right lower limb: Secondary | ICD-10-CM | POA: Diagnosis not present

## 2023-03-18 DIAGNOSIS — L97821 Non-pressure chronic ulcer of other part of left lower leg limited to breakdown of skin: Secondary | ICD-10-CM | POA: Diagnosis not present

## 2023-03-18 DIAGNOSIS — I890 Lymphedema, not elsewhere classified: Secondary | ICD-10-CM | POA: Diagnosis not present

## 2023-03-18 DIAGNOSIS — I872 Venous insufficiency (chronic) (peripheral): Secondary | ICD-10-CM | POA: Diagnosis not present

## 2023-03-18 DIAGNOSIS — L03116 Cellulitis of left lower limb: Secondary | ICD-10-CM | POA: Diagnosis not present

## 2023-03-19 DIAGNOSIS — L97821 Non-pressure chronic ulcer of other part of left lower leg limited to breakdown of skin: Secondary | ICD-10-CM | POA: Diagnosis not present

## 2023-03-19 DIAGNOSIS — L97418 Non-pressure chronic ulcer of right heel and midfoot with other specified severity: Secondary | ICD-10-CM | POA: Diagnosis not present

## 2023-03-19 DIAGNOSIS — L03115 Cellulitis of right lower limb: Secondary | ICD-10-CM | POA: Diagnosis not present

## 2023-03-19 DIAGNOSIS — I129 Hypertensive chronic kidney disease with stage 1 through stage 4 chronic kidney disease, or unspecified chronic kidney disease: Secondary | ICD-10-CM | POA: Diagnosis not present

## 2023-03-19 DIAGNOSIS — I872 Venous insufficiency (chronic) (peripheral): Secondary | ICD-10-CM | POA: Diagnosis not present

## 2023-03-19 DIAGNOSIS — Z4800 Encounter for change or removal of nonsurgical wound dressing: Secondary | ICD-10-CM | POA: Diagnosis not present

## 2023-03-19 DIAGNOSIS — L89892 Pressure ulcer of other site, stage 2: Secondary | ICD-10-CM | POA: Diagnosis not present

## 2023-03-19 DIAGNOSIS — L03116 Cellulitis of left lower limb: Secondary | ICD-10-CM | POA: Diagnosis not present

## 2023-03-19 DIAGNOSIS — I890 Lymphedema, not elsewhere classified: Secondary | ICD-10-CM | POA: Diagnosis not present

## 2023-03-20 ENCOUNTER — Other Ambulatory Visit: Payer: Self-pay | Admitting: Cardiology

## 2023-03-22 DIAGNOSIS — L03115 Cellulitis of right lower limb: Secondary | ICD-10-CM | POA: Diagnosis not present

## 2023-03-22 DIAGNOSIS — Z4800 Encounter for change or removal of nonsurgical wound dressing: Secondary | ICD-10-CM | POA: Diagnosis not present

## 2023-03-22 DIAGNOSIS — L03116 Cellulitis of left lower limb: Secondary | ICD-10-CM | POA: Diagnosis not present

## 2023-03-22 DIAGNOSIS — I129 Hypertensive chronic kidney disease with stage 1 through stage 4 chronic kidney disease, or unspecified chronic kidney disease: Secondary | ICD-10-CM | POA: Diagnosis not present

## 2023-03-22 DIAGNOSIS — L89892 Pressure ulcer of other site, stage 2: Secondary | ICD-10-CM | POA: Diagnosis not present

## 2023-03-22 DIAGNOSIS — I890 Lymphedema, not elsewhere classified: Secondary | ICD-10-CM | POA: Diagnosis not present

## 2023-03-22 DIAGNOSIS — I872 Venous insufficiency (chronic) (peripheral): Secondary | ICD-10-CM | POA: Diagnosis not present

## 2023-03-22 DIAGNOSIS — L97418 Non-pressure chronic ulcer of right heel and midfoot with other specified severity: Secondary | ICD-10-CM | POA: Diagnosis not present

## 2023-03-22 DIAGNOSIS — L97821 Non-pressure chronic ulcer of other part of left lower leg limited to breakdown of skin: Secondary | ICD-10-CM | POA: Diagnosis not present

## 2023-03-25 DIAGNOSIS — L89892 Pressure ulcer of other site, stage 2: Secondary | ICD-10-CM | POA: Diagnosis not present

## 2023-03-25 DIAGNOSIS — I872 Venous insufficiency (chronic) (peripheral): Secondary | ICD-10-CM | POA: Diagnosis not present

## 2023-03-25 DIAGNOSIS — Z4800 Encounter for change or removal of nonsurgical wound dressing: Secondary | ICD-10-CM | POA: Diagnosis not present

## 2023-03-25 DIAGNOSIS — I890 Lymphedema, not elsewhere classified: Secondary | ICD-10-CM | POA: Diagnosis not present

## 2023-03-25 DIAGNOSIS — L97821 Non-pressure chronic ulcer of other part of left lower leg limited to breakdown of skin: Secondary | ICD-10-CM | POA: Diagnosis not present

## 2023-03-25 DIAGNOSIS — L03115 Cellulitis of right lower limb: Secondary | ICD-10-CM | POA: Diagnosis not present

## 2023-03-25 DIAGNOSIS — L97418 Non-pressure chronic ulcer of right heel and midfoot with other specified severity: Secondary | ICD-10-CM | POA: Diagnosis not present

## 2023-03-25 DIAGNOSIS — I129 Hypertensive chronic kidney disease with stage 1 through stage 4 chronic kidney disease, or unspecified chronic kidney disease: Secondary | ICD-10-CM | POA: Diagnosis not present

## 2023-03-25 DIAGNOSIS — L03116 Cellulitis of left lower limb: Secondary | ICD-10-CM | POA: Diagnosis not present

## 2023-03-26 DIAGNOSIS — Z9181 History of falling: Secondary | ICD-10-CM | POA: Diagnosis not present

## 2023-03-26 DIAGNOSIS — L89892 Pressure ulcer of other site, stage 2: Secondary | ICD-10-CM | POA: Diagnosis not present

## 2023-03-26 DIAGNOSIS — S71102D Unspecified open wound, left thigh, subsequent encounter: Secondary | ICD-10-CM | POA: Diagnosis not present

## 2023-03-26 DIAGNOSIS — G40309 Generalized idiopathic epilepsy and epileptic syndromes, not intractable, without status epilepticus: Secondary | ICD-10-CM | POA: Diagnosis not present

## 2023-03-26 DIAGNOSIS — I872 Venous insufficiency (chronic) (peripheral): Secondary | ICD-10-CM | POA: Diagnosis not present

## 2023-03-26 DIAGNOSIS — I890 Lymphedema, not elsewhere classified: Secondary | ICD-10-CM | POA: Diagnosis not present

## 2023-03-26 DIAGNOSIS — M6281 Muscle weakness (generalized): Secondary | ICD-10-CM | POA: Diagnosis not present

## 2023-03-29 DIAGNOSIS — L97821 Non-pressure chronic ulcer of other part of left lower leg limited to breakdown of skin: Secondary | ICD-10-CM | POA: Diagnosis not present

## 2023-03-29 DIAGNOSIS — Z4800 Encounter for change or removal of nonsurgical wound dressing: Secondary | ICD-10-CM | POA: Diagnosis not present

## 2023-03-29 DIAGNOSIS — L97418 Non-pressure chronic ulcer of right heel and midfoot with other specified severity: Secondary | ICD-10-CM | POA: Diagnosis not present

## 2023-03-29 DIAGNOSIS — I890 Lymphedema, not elsewhere classified: Secondary | ICD-10-CM | POA: Diagnosis not present

## 2023-03-29 DIAGNOSIS — L03115 Cellulitis of right lower limb: Secondary | ICD-10-CM | POA: Diagnosis not present

## 2023-03-29 DIAGNOSIS — I129 Hypertensive chronic kidney disease with stage 1 through stage 4 chronic kidney disease, or unspecified chronic kidney disease: Secondary | ICD-10-CM | POA: Diagnosis not present

## 2023-03-29 DIAGNOSIS — I872 Venous insufficiency (chronic) (peripheral): Secondary | ICD-10-CM | POA: Diagnosis not present

## 2023-03-29 DIAGNOSIS — L03116 Cellulitis of left lower limb: Secondary | ICD-10-CM | POA: Diagnosis not present

## 2023-03-29 DIAGNOSIS — L89892 Pressure ulcer of other site, stage 2: Secondary | ICD-10-CM | POA: Diagnosis not present

## 2023-04-01 DIAGNOSIS — L97418 Non-pressure chronic ulcer of right heel and midfoot with other specified severity: Secondary | ICD-10-CM | POA: Diagnosis not present

## 2023-04-01 DIAGNOSIS — L03115 Cellulitis of right lower limb: Secondary | ICD-10-CM | POA: Diagnosis not present

## 2023-04-01 DIAGNOSIS — I129 Hypertensive chronic kidney disease with stage 1 through stage 4 chronic kidney disease, or unspecified chronic kidney disease: Secondary | ICD-10-CM | POA: Diagnosis not present

## 2023-04-01 DIAGNOSIS — I872 Venous insufficiency (chronic) (peripheral): Secondary | ICD-10-CM | POA: Diagnosis not present

## 2023-04-01 DIAGNOSIS — Z4800 Encounter for change or removal of nonsurgical wound dressing: Secondary | ICD-10-CM | POA: Diagnosis not present

## 2023-04-01 DIAGNOSIS — I890 Lymphedema, not elsewhere classified: Secondary | ICD-10-CM | POA: Diagnosis not present

## 2023-04-01 DIAGNOSIS — L03116 Cellulitis of left lower limb: Secondary | ICD-10-CM | POA: Diagnosis not present

## 2023-04-01 DIAGNOSIS — L97821 Non-pressure chronic ulcer of other part of left lower leg limited to breakdown of skin: Secondary | ICD-10-CM | POA: Diagnosis not present

## 2023-04-01 DIAGNOSIS — L89892 Pressure ulcer of other site, stage 2: Secondary | ICD-10-CM | POA: Diagnosis not present

## 2023-04-06 DIAGNOSIS — L89892 Pressure ulcer of other site, stage 2: Secondary | ICD-10-CM | POA: Diagnosis not present

## 2023-04-06 DIAGNOSIS — L03115 Cellulitis of right lower limb: Secondary | ICD-10-CM | POA: Diagnosis not present

## 2023-04-06 DIAGNOSIS — Z4800 Encounter for change or removal of nonsurgical wound dressing: Secondary | ICD-10-CM | POA: Diagnosis not present

## 2023-04-06 DIAGNOSIS — L03116 Cellulitis of left lower limb: Secondary | ICD-10-CM | POA: Diagnosis not present

## 2023-04-06 DIAGNOSIS — I872 Venous insufficiency (chronic) (peripheral): Secondary | ICD-10-CM | POA: Diagnosis not present

## 2023-04-06 DIAGNOSIS — L97418 Non-pressure chronic ulcer of right heel and midfoot with other specified severity: Secondary | ICD-10-CM | POA: Diagnosis not present

## 2023-04-06 DIAGNOSIS — L97821 Non-pressure chronic ulcer of other part of left lower leg limited to breakdown of skin: Secondary | ICD-10-CM | POA: Diagnosis not present

## 2023-04-06 DIAGNOSIS — I890 Lymphedema, not elsewhere classified: Secondary | ICD-10-CM | POA: Diagnosis not present

## 2023-04-06 DIAGNOSIS — I129 Hypertensive chronic kidney disease with stage 1 through stage 4 chronic kidney disease, or unspecified chronic kidney disease: Secondary | ICD-10-CM | POA: Diagnosis not present

## 2023-04-07 DIAGNOSIS — I129 Hypertensive chronic kidney disease with stage 1 through stage 4 chronic kidney disease, or unspecified chronic kidney disease: Secondary | ICD-10-CM | POA: Diagnosis not present

## 2023-04-07 DIAGNOSIS — L03115 Cellulitis of right lower limb: Secondary | ICD-10-CM | POA: Diagnosis not present

## 2023-04-07 DIAGNOSIS — Z4800 Encounter for change or removal of nonsurgical wound dressing: Secondary | ICD-10-CM | POA: Diagnosis not present

## 2023-04-07 DIAGNOSIS — L89892 Pressure ulcer of other site, stage 2: Secondary | ICD-10-CM | POA: Diagnosis not present

## 2023-04-07 DIAGNOSIS — L97821 Non-pressure chronic ulcer of other part of left lower leg limited to breakdown of skin: Secondary | ICD-10-CM | POA: Diagnosis not present

## 2023-04-07 DIAGNOSIS — I872 Venous insufficiency (chronic) (peripheral): Secondary | ICD-10-CM | POA: Diagnosis not present

## 2023-04-07 DIAGNOSIS — L03116 Cellulitis of left lower limb: Secondary | ICD-10-CM | POA: Diagnosis not present

## 2023-04-07 DIAGNOSIS — L97418 Non-pressure chronic ulcer of right heel and midfoot with other specified severity: Secondary | ICD-10-CM | POA: Diagnosis not present

## 2023-04-07 DIAGNOSIS — I890 Lymphedema, not elsewhere classified: Secondary | ICD-10-CM | POA: Diagnosis not present

## 2023-04-12 DIAGNOSIS — Z4800 Encounter for change or removal of nonsurgical wound dressing: Secondary | ICD-10-CM | POA: Diagnosis not present

## 2023-04-12 DIAGNOSIS — L03115 Cellulitis of right lower limb: Secondary | ICD-10-CM | POA: Diagnosis not present

## 2023-04-12 DIAGNOSIS — L97821 Non-pressure chronic ulcer of other part of left lower leg limited to breakdown of skin: Secondary | ICD-10-CM | POA: Diagnosis not present

## 2023-04-12 DIAGNOSIS — L97418 Non-pressure chronic ulcer of right heel and midfoot with other specified severity: Secondary | ICD-10-CM | POA: Diagnosis not present

## 2023-04-12 DIAGNOSIS — I890 Lymphedema, not elsewhere classified: Secondary | ICD-10-CM | POA: Diagnosis not present

## 2023-04-12 DIAGNOSIS — I129 Hypertensive chronic kidney disease with stage 1 through stage 4 chronic kidney disease, or unspecified chronic kidney disease: Secondary | ICD-10-CM | POA: Diagnosis not present

## 2023-04-12 DIAGNOSIS — L03116 Cellulitis of left lower limb: Secondary | ICD-10-CM | POA: Diagnosis not present

## 2023-04-12 DIAGNOSIS — L89892 Pressure ulcer of other site, stage 2: Secondary | ICD-10-CM | POA: Diagnosis not present

## 2023-04-12 DIAGNOSIS — I872 Venous insufficiency (chronic) (peripheral): Secondary | ICD-10-CM | POA: Diagnosis not present

## 2023-04-15 DIAGNOSIS — L03116 Cellulitis of left lower limb: Secondary | ICD-10-CM | POA: Diagnosis not present

## 2023-04-15 DIAGNOSIS — L97821 Non-pressure chronic ulcer of other part of left lower leg limited to breakdown of skin: Secondary | ICD-10-CM | POA: Diagnosis not present

## 2023-04-15 DIAGNOSIS — I129 Hypertensive chronic kidney disease with stage 1 through stage 4 chronic kidney disease, or unspecified chronic kidney disease: Secondary | ICD-10-CM | POA: Diagnosis not present

## 2023-04-15 DIAGNOSIS — I872 Venous insufficiency (chronic) (peripheral): Secondary | ICD-10-CM | POA: Diagnosis not present

## 2023-04-15 DIAGNOSIS — L97418 Non-pressure chronic ulcer of right heel and midfoot with other specified severity: Secondary | ICD-10-CM | POA: Diagnosis not present

## 2023-04-15 DIAGNOSIS — L03115 Cellulitis of right lower limb: Secondary | ICD-10-CM | POA: Diagnosis not present

## 2023-04-15 DIAGNOSIS — L89892 Pressure ulcer of other site, stage 2: Secondary | ICD-10-CM | POA: Diagnosis not present

## 2023-04-15 DIAGNOSIS — Z4800 Encounter for change or removal of nonsurgical wound dressing: Secondary | ICD-10-CM | POA: Diagnosis not present

## 2023-04-15 DIAGNOSIS — I890 Lymphedema, not elsewhere classified: Secondary | ICD-10-CM | POA: Diagnosis not present

## 2023-04-19 DIAGNOSIS — L03116 Cellulitis of left lower limb: Secondary | ICD-10-CM | POA: Diagnosis not present

## 2023-04-19 DIAGNOSIS — L97821 Non-pressure chronic ulcer of other part of left lower leg limited to breakdown of skin: Secondary | ICD-10-CM | POA: Diagnosis not present

## 2023-04-19 DIAGNOSIS — I129 Hypertensive chronic kidney disease with stage 1 through stage 4 chronic kidney disease, or unspecified chronic kidney disease: Secondary | ICD-10-CM | POA: Diagnosis not present

## 2023-04-19 DIAGNOSIS — Z4800 Encounter for change or removal of nonsurgical wound dressing: Secondary | ICD-10-CM | POA: Diagnosis not present

## 2023-04-19 DIAGNOSIS — L89892 Pressure ulcer of other site, stage 2: Secondary | ICD-10-CM | POA: Diagnosis not present

## 2023-04-19 DIAGNOSIS — I872 Venous insufficiency (chronic) (peripheral): Secondary | ICD-10-CM | POA: Diagnosis not present

## 2023-04-19 DIAGNOSIS — I890 Lymphedema, not elsewhere classified: Secondary | ICD-10-CM | POA: Diagnosis not present

## 2023-04-19 DIAGNOSIS — L97418 Non-pressure chronic ulcer of right heel and midfoot with other specified severity: Secondary | ICD-10-CM | POA: Diagnosis not present

## 2023-04-19 DIAGNOSIS — L03115 Cellulitis of right lower limb: Secondary | ICD-10-CM | POA: Diagnosis not present

## 2023-04-22 DIAGNOSIS — L03115 Cellulitis of right lower limb: Secondary | ICD-10-CM | POA: Diagnosis not present

## 2023-04-22 DIAGNOSIS — I872 Venous insufficiency (chronic) (peripheral): Secondary | ICD-10-CM | POA: Diagnosis not present

## 2023-04-22 DIAGNOSIS — L89892 Pressure ulcer of other site, stage 2: Secondary | ICD-10-CM | POA: Diagnosis not present

## 2023-04-22 DIAGNOSIS — Z4800 Encounter for change or removal of nonsurgical wound dressing: Secondary | ICD-10-CM | POA: Diagnosis not present

## 2023-04-22 DIAGNOSIS — I890 Lymphedema, not elsewhere classified: Secondary | ICD-10-CM | POA: Diagnosis not present

## 2023-04-22 DIAGNOSIS — L97418 Non-pressure chronic ulcer of right heel and midfoot with other specified severity: Secondary | ICD-10-CM | POA: Diagnosis not present

## 2023-04-22 DIAGNOSIS — L03116 Cellulitis of left lower limb: Secondary | ICD-10-CM | POA: Diagnosis not present

## 2023-04-22 DIAGNOSIS — I129 Hypertensive chronic kidney disease with stage 1 through stage 4 chronic kidney disease, or unspecified chronic kidney disease: Secondary | ICD-10-CM | POA: Diagnosis not present

## 2023-04-22 DIAGNOSIS — L97821 Non-pressure chronic ulcer of other part of left lower leg limited to breakdown of skin: Secondary | ICD-10-CM | POA: Diagnosis not present

## 2023-04-23 ENCOUNTER — Ambulatory Visit: Payer: Medicare PPO | Attending: Nurse Practitioner | Admitting: Nurse Practitioner

## 2023-04-23 ENCOUNTER — Encounter: Payer: Self-pay | Admitting: Nurse Practitioner

## 2023-04-23 VITALS — BP 170/80 | HR 69 | Ht 62.0 in | Wt 280.4 lb

## 2023-04-23 DIAGNOSIS — I890 Lymphedema, not elsewhere classified: Secondary | ICD-10-CM | POA: Diagnosis not present

## 2023-04-23 DIAGNOSIS — I10 Essential (primary) hypertension: Secondary | ICD-10-CM | POA: Diagnosis not present

## 2023-04-23 DIAGNOSIS — G4733 Obstructive sleep apnea (adult) (pediatric): Secondary | ICD-10-CM

## 2023-04-23 DIAGNOSIS — Z87898 Personal history of other specified conditions: Secondary | ICD-10-CM | POA: Diagnosis not present

## 2023-04-23 DIAGNOSIS — R600 Localized edema: Secondary | ICD-10-CM

## 2023-04-23 NOTE — Patient Instructions (Signed)
Medication Instructions:  Your physician recommends that you continue on your current medications as directed. Please refer to the Current Medication list given to you today.  *If you need a refill on your cardiac medications before your next appointment, please call your pharmacy*   Lab Work: NONE ordered at this time of appointment    Testing/Procedures: NONE ordered at this time of appointment     Follow-Up: At [facility], you and your health needs are our priority.  As part of our continuing mission to provide you with exceptional heart care, we have created designated Provider Care Teams.  These Care Teams include your primary Cardiologist (physician) and Advanced Practice Providers (APPs -  Physician Assistants and Nurse Practitioners) who all work together to provide you with the care you need, when you need it.  We recommend signing up for the patient portal called "MyChart".  Sign up information is provided on this After Visit Summary.  MyChart is used to connect with patients for Virtual Visits (Telemedicine).  Patients are able to view lab/test results, encounter notes, upcoming appointments, etc.  Non-urgent messages can be sent to your provider as well.   To learn more about what you can do with MyChart, go to [email or web address].    Your next appointment:   6 month(s)  Provider:   David Harding, MD     Other Instructions   
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
## 2023-04-25 ENCOUNTER — Encounter: Payer: Self-pay | Admitting: Nurse Practitioner

## 2023-04-25 DIAGNOSIS — S71102D Unspecified open wound, left thigh, subsequent encounter: Secondary | ICD-10-CM | POA: Diagnosis not present

## 2023-04-25 DIAGNOSIS — I890 Lymphedema, not elsewhere classified: Secondary | ICD-10-CM | POA: Diagnosis not present

## 2023-04-25 DIAGNOSIS — G40309 Generalized idiopathic epilepsy and epileptic syndromes, not intractable, without status epilepticus: Secondary | ICD-10-CM | POA: Diagnosis not present

## 2023-04-25 DIAGNOSIS — Z9181 History of falling: Secondary | ICD-10-CM | POA: Diagnosis not present

## 2023-04-25 DIAGNOSIS — L89892 Pressure ulcer of other site, stage 2: Secondary | ICD-10-CM | POA: Diagnosis not present

## 2023-04-25 DIAGNOSIS — I872 Venous insufficiency (chronic) (peripheral): Secondary | ICD-10-CM | POA: Diagnosis not present

## 2023-04-25 DIAGNOSIS — M6281 Muscle weakness (generalized): Secondary | ICD-10-CM | POA: Diagnosis not present

## 2023-04-26 DIAGNOSIS — L03116 Cellulitis of left lower limb: Secondary | ICD-10-CM | POA: Diagnosis not present

## 2023-04-26 DIAGNOSIS — I872 Venous insufficiency (chronic) (peripheral): Secondary | ICD-10-CM | POA: Diagnosis not present

## 2023-04-26 DIAGNOSIS — I890 Lymphedema, not elsewhere classified: Secondary | ICD-10-CM | POA: Diagnosis not present

## 2023-04-26 DIAGNOSIS — Z4800 Encounter for change or removal of nonsurgical wound dressing: Secondary | ICD-10-CM | POA: Diagnosis not present

## 2023-04-26 DIAGNOSIS — L97418 Non-pressure chronic ulcer of right heel and midfoot with other specified severity: Secondary | ICD-10-CM | POA: Diagnosis not present

## 2023-04-26 DIAGNOSIS — L97821 Non-pressure chronic ulcer of other part of left lower leg limited to breakdown of skin: Secondary | ICD-10-CM | POA: Diagnosis not present

## 2023-04-26 DIAGNOSIS — L03115 Cellulitis of right lower limb: Secondary | ICD-10-CM | POA: Diagnosis not present

## 2023-04-26 DIAGNOSIS — L89892 Pressure ulcer of other site, stage 2: Secondary | ICD-10-CM | POA: Diagnosis not present

## 2023-04-26 DIAGNOSIS — I129 Hypertensive chronic kidney disease with stage 1 through stage 4 chronic kidney disease, or unspecified chronic kidney disease: Secondary | ICD-10-CM | POA: Diagnosis not present

## 2023-05-03 DIAGNOSIS — L89892 Pressure ulcer of other site, stage 2: Secondary | ICD-10-CM | POA: Diagnosis not present

## 2023-05-03 DIAGNOSIS — L03115 Cellulitis of right lower limb: Secondary | ICD-10-CM | POA: Diagnosis not present

## 2023-05-03 DIAGNOSIS — I872 Venous insufficiency (chronic) (peripheral): Secondary | ICD-10-CM | POA: Diagnosis not present

## 2023-05-03 DIAGNOSIS — I890 Lymphedema, not elsewhere classified: Secondary | ICD-10-CM | POA: Diagnosis not present

## 2023-05-03 DIAGNOSIS — Z4800 Encounter for change or removal of nonsurgical wound dressing: Secondary | ICD-10-CM | POA: Diagnosis not present

## 2023-05-03 DIAGNOSIS — L97821 Non-pressure chronic ulcer of other part of left lower leg limited to breakdown of skin: Secondary | ICD-10-CM | POA: Diagnosis not present

## 2023-05-03 DIAGNOSIS — L97418 Non-pressure chronic ulcer of right heel and midfoot with other specified severity: Secondary | ICD-10-CM | POA: Diagnosis not present

## 2023-05-03 DIAGNOSIS — I129 Hypertensive chronic kidney disease with stage 1 through stage 4 chronic kidney disease, or unspecified chronic kidney disease: Secondary | ICD-10-CM | POA: Diagnosis not present

## 2023-05-03 DIAGNOSIS — L03116 Cellulitis of left lower limb: Secondary | ICD-10-CM | POA: Diagnosis not present

## 2023-05-03 DIAGNOSIS — Z1231 Encounter for screening mammogram for malignant neoplasm of breast: Secondary | ICD-10-CM | POA: Diagnosis not present

## 2023-05-10 DIAGNOSIS — I129 Hypertensive chronic kidney disease with stage 1 through stage 4 chronic kidney disease, or unspecified chronic kidney disease: Secondary | ICD-10-CM | POA: Diagnosis not present

## 2023-05-10 DIAGNOSIS — L97821 Non-pressure chronic ulcer of other part of left lower leg limited to breakdown of skin: Secondary | ICD-10-CM | POA: Diagnosis not present

## 2023-05-10 DIAGNOSIS — Z4800 Encounter for change or removal of nonsurgical wound dressing: Secondary | ICD-10-CM | POA: Diagnosis not present

## 2023-05-10 DIAGNOSIS — L03116 Cellulitis of left lower limb: Secondary | ICD-10-CM | POA: Diagnosis not present

## 2023-05-10 DIAGNOSIS — L97418 Non-pressure chronic ulcer of right heel and midfoot with other specified severity: Secondary | ICD-10-CM | POA: Diagnosis not present

## 2023-05-10 DIAGNOSIS — I890 Lymphedema, not elsewhere classified: Secondary | ICD-10-CM | POA: Diagnosis not present

## 2023-05-10 DIAGNOSIS — I872 Venous insufficiency (chronic) (peripheral): Secondary | ICD-10-CM | POA: Diagnosis not present

## 2023-05-10 DIAGNOSIS — L03115 Cellulitis of right lower limb: Secondary | ICD-10-CM | POA: Diagnosis not present

## 2023-05-10 DIAGNOSIS — L89892 Pressure ulcer of other site, stage 2: Secondary | ICD-10-CM | POA: Diagnosis not present

## 2023-05-13 ENCOUNTER — Other Ambulatory Visit: Payer: Self-pay | Admitting: Cardiology

## 2023-05-26 DIAGNOSIS — M6281 Muscle weakness (generalized): Secondary | ICD-10-CM | POA: Diagnosis not present

## 2023-05-26 DIAGNOSIS — L89892 Pressure ulcer of other site, stage 2: Secondary | ICD-10-CM | POA: Diagnosis not present

## 2023-05-26 DIAGNOSIS — Z9181 History of falling: Secondary | ICD-10-CM | POA: Diagnosis not present

## 2023-05-26 DIAGNOSIS — G40309 Generalized idiopathic epilepsy and epileptic syndromes, not intractable, without status epilepticus: Secondary | ICD-10-CM | POA: Diagnosis not present

## 2023-05-26 DIAGNOSIS — S71102D Unspecified open wound, left thigh, subsequent encounter: Secondary | ICD-10-CM | POA: Diagnosis not present

## 2023-05-26 DIAGNOSIS — I890 Lymphedema, not elsewhere classified: Secondary | ICD-10-CM | POA: Diagnosis not present

## 2023-05-26 DIAGNOSIS — I872 Venous insufficiency (chronic) (peripheral): Secondary | ICD-10-CM | POA: Diagnosis not present

## 2023-06-07 ENCOUNTER — Telehealth: Payer: Self-pay | Admitting: Adult Health

## 2023-06-07 NOTE — Telephone Encounter (Signed)
Ok we will review upon receipt.

## 2023-06-07 NOTE — Telephone Encounter (Signed)
FYI: Pt said have DMV forms to be filled out by neurology for being able to continue to drive. Will drop forms today and pay $50 fee.

## 2023-06-08 DIAGNOSIS — Z0289 Encounter for other administrative examinations: Secondary | ICD-10-CM

## 2023-06-09 DIAGNOSIS — I890 Lymphedema, not elsewhere classified: Secondary | ICD-10-CM | POA: Diagnosis not present

## 2023-06-09 DIAGNOSIS — L8990 Pressure ulcer of unspecified site, unspecified stage: Secondary | ICD-10-CM | POA: Diagnosis not present

## 2023-06-10 NOTE — Telephone Encounter (Signed)
Received.  DMV completed.  To Megan NP to sign off.

## 2023-06-11 DIAGNOSIS — D649 Anemia, unspecified: Secondary | ICD-10-CM | POA: Diagnosis not present

## 2023-06-11 DIAGNOSIS — L89892 Pressure ulcer of other site, stage 2: Secondary | ICD-10-CM | POA: Diagnosis not present

## 2023-06-11 DIAGNOSIS — I890 Lymphedema, not elsewhere classified: Secondary | ICD-10-CM | POA: Diagnosis not present

## 2023-06-11 DIAGNOSIS — G40309 Generalized idiopathic epilepsy and epileptic syndromes, not intractable, without status epilepticus: Secondary | ICD-10-CM | POA: Diagnosis not present

## 2023-06-11 DIAGNOSIS — I872 Venous insufficiency (chronic) (peripheral): Secondary | ICD-10-CM | POA: Diagnosis not present

## 2023-06-11 DIAGNOSIS — N182 Chronic kidney disease, stage 2 (mild): Secondary | ICD-10-CM | POA: Diagnosis not present

## 2023-06-11 DIAGNOSIS — I83893 Varicose veins of bilateral lower extremities with other complications: Secondary | ICD-10-CM | POA: Diagnosis not present

## 2023-06-11 DIAGNOSIS — L89311 Pressure ulcer of right buttock, stage 1: Secondary | ICD-10-CM | POA: Diagnosis not present

## 2023-06-11 DIAGNOSIS — I129 Hypertensive chronic kidney disease with stage 1 through stage 4 chronic kidney disease, or unspecified chronic kidney disease: Secondary | ICD-10-CM | POA: Diagnosis not present

## 2023-06-15 NOTE — Telephone Encounter (Signed)
DMV signed to medical records.

## 2023-06-16 DIAGNOSIS — G40309 Generalized idiopathic epilepsy and epileptic syndromes, not intractable, without status epilepticus: Secondary | ICD-10-CM | POA: Diagnosis not present

## 2023-06-16 DIAGNOSIS — L89311 Pressure ulcer of right buttock, stage 1: Secondary | ICD-10-CM | POA: Diagnosis not present

## 2023-06-16 DIAGNOSIS — I129 Hypertensive chronic kidney disease with stage 1 through stage 4 chronic kidney disease, or unspecified chronic kidney disease: Secondary | ICD-10-CM | POA: Diagnosis not present

## 2023-06-16 DIAGNOSIS — I83893 Varicose veins of bilateral lower extremities with other complications: Secondary | ICD-10-CM | POA: Diagnosis not present

## 2023-06-16 DIAGNOSIS — D649 Anemia, unspecified: Secondary | ICD-10-CM | POA: Diagnosis not present

## 2023-06-16 DIAGNOSIS — L89892 Pressure ulcer of other site, stage 2: Secondary | ICD-10-CM | POA: Diagnosis not present

## 2023-06-16 DIAGNOSIS — I890 Lymphedema, not elsewhere classified: Secondary | ICD-10-CM | POA: Diagnosis not present

## 2023-06-16 DIAGNOSIS — N182 Chronic kidney disease, stage 2 (mild): Secondary | ICD-10-CM | POA: Diagnosis not present

## 2023-06-16 DIAGNOSIS — I872 Venous insufficiency (chronic) (peripheral): Secondary | ICD-10-CM | POA: Diagnosis not present

## 2023-06-17 DIAGNOSIS — I872 Venous insufficiency (chronic) (peripheral): Secondary | ICD-10-CM | POA: Diagnosis not present

## 2023-06-17 DIAGNOSIS — L89311 Pressure ulcer of right buttock, stage 1: Secondary | ICD-10-CM | POA: Diagnosis not present

## 2023-06-17 DIAGNOSIS — I83893 Varicose veins of bilateral lower extremities with other complications: Secondary | ICD-10-CM | POA: Diagnosis not present

## 2023-06-17 DIAGNOSIS — N182 Chronic kidney disease, stage 2 (mild): Secondary | ICD-10-CM | POA: Diagnosis not present

## 2023-06-17 DIAGNOSIS — I129 Hypertensive chronic kidney disease with stage 1 through stage 4 chronic kidney disease, or unspecified chronic kidney disease: Secondary | ICD-10-CM | POA: Diagnosis not present

## 2023-06-17 DIAGNOSIS — G40309 Generalized idiopathic epilepsy and epileptic syndromes, not intractable, without status epilepticus: Secondary | ICD-10-CM | POA: Diagnosis not present

## 2023-06-17 DIAGNOSIS — I890 Lymphedema, not elsewhere classified: Secondary | ICD-10-CM | POA: Diagnosis not present

## 2023-06-17 DIAGNOSIS — L89892 Pressure ulcer of other site, stage 2: Secondary | ICD-10-CM | POA: Diagnosis not present

## 2023-06-17 DIAGNOSIS — D649 Anemia, unspecified: Secondary | ICD-10-CM | POA: Diagnosis not present

## 2023-06-22 DIAGNOSIS — I890 Lymphedema, not elsewhere classified: Secondary | ICD-10-CM | POA: Diagnosis not present

## 2023-06-22 DIAGNOSIS — I129 Hypertensive chronic kidney disease with stage 1 through stage 4 chronic kidney disease, or unspecified chronic kidney disease: Secondary | ICD-10-CM | POA: Diagnosis not present

## 2023-06-22 DIAGNOSIS — I83893 Varicose veins of bilateral lower extremities with other complications: Secondary | ICD-10-CM | POA: Diagnosis not present

## 2023-06-22 DIAGNOSIS — L89311 Pressure ulcer of right buttock, stage 1: Secondary | ICD-10-CM | POA: Diagnosis not present

## 2023-06-22 DIAGNOSIS — D649 Anemia, unspecified: Secondary | ICD-10-CM | POA: Diagnosis not present

## 2023-06-22 DIAGNOSIS — N182 Chronic kidney disease, stage 2 (mild): Secondary | ICD-10-CM | POA: Diagnosis not present

## 2023-06-22 DIAGNOSIS — G40309 Generalized idiopathic epilepsy and epileptic syndromes, not intractable, without status epilepticus: Secondary | ICD-10-CM | POA: Diagnosis not present

## 2023-06-22 DIAGNOSIS — I872 Venous insufficiency (chronic) (peripheral): Secondary | ICD-10-CM | POA: Diagnosis not present

## 2023-06-22 DIAGNOSIS — L89892 Pressure ulcer of other site, stage 2: Secondary | ICD-10-CM | POA: Diagnosis not present

## 2023-06-23 DIAGNOSIS — I129 Hypertensive chronic kidney disease with stage 1 through stage 4 chronic kidney disease, or unspecified chronic kidney disease: Secondary | ICD-10-CM | POA: Diagnosis not present

## 2023-06-23 DIAGNOSIS — N182 Chronic kidney disease, stage 2 (mild): Secondary | ICD-10-CM | POA: Diagnosis not present

## 2023-06-23 DIAGNOSIS — L89892 Pressure ulcer of other site, stage 2: Secondary | ICD-10-CM | POA: Diagnosis not present

## 2023-06-23 DIAGNOSIS — I872 Venous insufficiency (chronic) (peripheral): Secondary | ICD-10-CM | POA: Diagnosis not present

## 2023-06-23 DIAGNOSIS — I83893 Varicose veins of bilateral lower extremities with other complications: Secondary | ICD-10-CM | POA: Diagnosis not present

## 2023-06-23 DIAGNOSIS — I890 Lymphedema, not elsewhere classified: Secondary | ICD-10-CM | POA: Diagnosis not present

## 2023-06-23 DIAGNOSIS — D649 Anemia, unspecified: Secondary | ICD-10-CM | POA: Diagnosis not present

## 2023-06-23 DIAGNOSIS — G40309 Generalized idiopathic epilepsy and epileptic syndromes, not intractable, without status epilepticus: Secondary | ICD-10-CM | POA: Diagnosis not present

## 2023-06-23 DIAGNOSIS — L89311 Pressure ulcer of right buttock, stage 1: Secondary | ICD-10-CM | POA: Diagnosis not present

## 2023-06-24 ENCOUNTER — Other Ambulatory Visit: Payer: Self-pay | Admitting: Cardiology

## 2023-06-25 DIAGNOSIS — I129 Hypertensive chronic kidney disease with stage 1 through stage 4 chronic kidney disease, or unspecified chronic kidney disease: Secondary | ICD-10-CM | POA: Diagnosis not present

## 2023-06-25 DIAGNOSIS — G40309 Generalized idiopathic epilepsy and epileptic syndromes, not intractable, without status epilepticus: Secondary | ICD-10-CM | POA: Diagnosis not present

## 2023-06-25 DIAGNOSIS — L89311 Pressure ulcer of right buttock, stage 1: Secondary | ICD-10-CM | POA: Diagnosis not present

## 2023-06-25 DIAGNOSIS — D649 Anemia, unspecified: Secondary | ICD-10-CM | POA: Diagnosis not present

## 2023-06-25 DIAGNOSIS — N182 Chronic kidney disease, stage 2 (mild): Secondary | ICD-10-CM | POA: Diagnosis not present

## 2023-06-25 DIAGNOSIS — I83893 Varicose veins of bilateral lower extremities with other complications: Secondary | ICD-10-CM | POA: Diagnosis not present

## 2023-06-25 DIAGNOSIS — I890 Lymphedema, not elsewhere classified: Secondary | ICD-10-CM | POA: Diagnosis not present

## 2023-06-25 DIAGNOSIS — I872 Venous insufficiency (chronic) (peripheral): Secondary | ICD-10-CM | POA: Diagnosis not present

## 2023-06-25 DIAGNOSIS — L89892 Pressure ulcer of other site, stage 2: Secondary | ICD-10-CM | POA: Diagnosis not present

## 2023-06-26 DIAGNOSIS — G40309 Generalized idiopathic epilepsy and epileptic syndromes, not intractable, without status epilepticus: Secondary | ICD-10-CM | POA: Diagnosis not present

## 2023-06-26 DIAGNOSIS — I890 Lymphedema, not elsewhere classified: Secondary | ICD-10-CM | POA: Diagnosis not present

## 2023-06-26 DIAGNOSIS — S71102D Unspecified open wound, left thigh, subsequent encounter: Secondary | ICD-10-CM | POA: Diagnosis not present

## 2023-06-26 DIAGNOSIS — I872 Venous insufficiency (chronic) (peripheral): Secondary | ICD-10-CM | POA: Diagnosis not present

## 2023-06-26 DIAGNOSIS — Z9181 History of falling: Secondary | ICD-10-CM | POA: Diagnosis not present

## 2023-06-26 DIAGNOSIS — L89892 Pressure ulcer of other site, stage 2: Secondary | ICD-10-CM | POA: Diagnosis not present

## 2023-06-26 DIAGNOSIS — M6281 Muscle weakness (generalized): Secondary | ICD-10-CM | POA: Diagnosis not present

## 2023-06-28 DIAGNOSIS — L89311 Pressure ulcer of right buttock, stage 1: Secondary | ICD-10-CM | POA: Diagnosis not present

## 2023-06-28 DIAGNOSIS — I890 Lymphedema, not elsewhere classified: Secondary | ICD-10-CM | POA: Diagnosis not present

## 2023-06-28 DIAGNOSIS — I129 Hypertensive chronic kidney disease with stage 1 through stage 4 chronic kidney disease, or unspecified chronic kidney disease: Secondary | ICD-10-CM | POA: Diagnosis not present

## 2023-06-28 DIAGNOSIS — G40309 Generalized idiopathic epilepsy and epileptic syndromes, not intractable, without status epilepticus: Secondary | ICD-10-CM | POA: Diagnosis not present

## 2023-06-28 DIAGNOSIS — I872 Venous insufficiency (chronic) (peripheral): Secondary | ICD-10-CM | POA: Diagnosis not present

## 2023-06-28 DIAGNOSIS — N182 Chronic kidney disease, stage 2 (mild): Secondary | ICD-10-CM | POA: Diagnosis not present

## 2023-06-28 DIAGNOSIS — L89892 Pressure ulcer of other site, stage 2: Secondary | ICD-10-CM | POA: Diagnosis not present

## 2023-06-28 DIAGNOSIS — D649 Anemia, unspecified: Secondary | ICD-10-CM | POA: Diagnosis not present

## 2023-06-28 DIAGNOSIS — I83893 Varicose veins of bilateral lower extremities with other complications: Secondary | ICD-10-CM | POA: Diagnosis not present

## 2023-06-29 DIAGNOSIS — D649 Anemia, unspecified: Secondary | ICD-10-CM | POA: Diagnosis not present

## 2023-06-29 DIAGNOSIS — I129 Hypertensive chronic kidney disease with stage 1 through stage 4 chronic kidney disease, or unspecified chronic kidney disease: Secondary | ICD-10-CM | POA: Diagnosis not present

## 2023-06-29 DIAGNOSIS — L89892 Pressure ulcer of other site, stage 2: Secondary | ICD-10-CM | POA: Diagnosis not present

## 2023-06-29 DIAGNOSIS — I872 Venous insufficiency (chronic) (peripheral): Secondary | ICD-10-CM | POA: Diagnosis not present

## 2023-06-29 DIAGNOSIS — I890 Lymphedema, not elsewhere classified: Secondary | ICD-10-CM | POA: Diagnosis not present

## 2023-06-29 DIAGNOSIS — L89311 Pressure ulcer of right buttock, stage 1: Secondary | ICD-10-CM | POA: Diagnosis not present

## 2023-06-29 DIAGNOSIS — G40309 Generalized idiopathic epilepsy and epileptic syndromes, not intractable, without status epilepticus: Secondary | ICD-10-CM | POA: Diagnosis not present

## 2023-06-29 DIAGNOSIS — I83893 Varicose veins of bilateral lower extremities with other complications: Secondary | ICD-10-CM | POA: Diagnosis not present

## 2023-06-29 DIAGNOSIS — N182 Chronic kidney disease, stage 2 (mild): Secondary | ICD-10-CM | POA: Diagnosis not present

## 2023-07-02 DIAGNOSIS — I872 Venous insufficiency (chronic) (peripheral): Secondary | ICD-10-CM | POA: Diagnosis not present

## 2023-07-02 DIAGNOSIS — G40309 Generalized idiopathic epilepsy and epileptic syndromes, not intractable, without status epilepticus: Secondary | ICD-10-CM | POA: Diagnosis not present

## 2023-07-02 DIAGNOSIS — I129 Hypertensive chronic kidney disease with stage 1 through stage 4 chronic kidney disease, or unspecified chronic kidney disease: Secondary | ICD-10-CM | POA: Diagnosis not present

## 2023-07-02 DIAGNOSIS — I83893 Varicose veins of bilateral lower extremities with other complications: Secondary | ICD-10-CM | POA: Diagnosis not present

## 2023-07-02 DIAGNOSIS — D649 Anemia, unspecified: Secondary | ICD-10-CM | POA: Diagnosis not present

## 2023-07-02 DIAGNOSIS — I890 Lymphedema, not elsewhere classified: Secondary | ICD-10-CM | POA: Diagnosis not present

## 2023-07-02 DIAGNOSIS — L89892 Pressure ulcer of other site, stage 2: Secondary | ICD-10-CM | POA: Diagnosis not present

## 2023-07-02 DIAGNOSIS — L89311 Pressure ulcer of right buttock, stage 1: Secondary | ICD-10-CM | POA: Diagnosis not present

## 2023-07-02 DIAGNOSIS — N182 Chronic kidney disease, stage 2 (mild): Secondary | ICD-10-CM | POA: Diagnosis not present

## 2023-07-05 DIAGNOSIS — I83893 Varicose veins of bilateral lower extremities with other complications: Secondary | ICD-10-CM | POA: Diagnosis not present

## 2023-07-05 DIAGNOSIS — I890 Lymphedema, not elsewhere classified: Secondary | ICD-10-CM | POA: Diagnosis not present

## 2023-07-05 DIAGNOSIS — L89311 Pressure ulcer of right buttock, stage 1: Secondary | ICD-10-CM | POA: Diagnosis not present

## 2023-07-05 DIAGNOSIS — N182 Chronic kidney disease, stage 2 (mild): Secondary | ICD-10-CM | POA: Diagnosis not present

## 2023-07-05 DIAGNOSIS — G40309 Generalized idiopathic epilepsy and epileptic syndromes, not intractable, without status epilepticus: Secondary | ICD-10-CM | POA: Diagnosis not present

## 2023-07-05 DIAGNOSIS — M545 Low back pain, unspecified: Secondary | ICD-10-CM | POA: Diagnosis not present

## 2023-07-05 DIAGNOSIS — D649 Anemia, unspecified: Secondary | ICD-10-CM | POA: Diagnosis not present

## 2023-07-05 DIAGNOSIS — L89892 Pressure ulcer of other site, stage 2: Secondary | ICD-10-CM | POA: Diagnosis not present

## 2023-07-05 DIAGNOSIS — I872 Venous insufficiency (chronic) (peripheral): Secondary | ICD-10-CM | POA: Diagnosis not present

## 2023-07-05 DIAGNOSIS — I129 Hypertensive chronic kidney disease with stage 1 through stage 4 chronic kidney disease, or unspecified chronic kidney disease: Secondary | ICD-10-CM | POA: Diagnosis not present

## 2023-07-05 DIAGNOSIS — M47896 Other spondylosis, lumbar region: Secondary | ICD-10-CM | POA: Diagnosis not present

## 2023-07-06 DIAGNOSIS — G40309 Generalized idiopathic epilepsy and epileptic syndromes, not intractable, without status epilepticus: Secondary | ICD-10-CM | POA: Diagnosis not present

## 2023-07-06 DIAGNOSIS — D649 Anemia, unspecified: Secondary | ICD-10-CM | POA: Diagnosis not present

## 2023-07-06 DIAGNOSIS — I890 Lymphedema, not elsewhere classified: Secondary | ICD-10-CM | POA: Diagnosis not present

## 2023-07-06 DIAGNOSIS — I129 Hypertensive chronic kidney disease with stage 1 through stage 4 chronic kidney disease, or unspecified chronic kidney disease: Secondary | ICD-10-CM | POA: Diagnosis not present

## 2023-07-06 DIAGNOSIS — I872 Venous insufficiency (chronic) (peripheral): Secondary | ICD-10-CM | POA: Diagnosis not present

## 2023-07-06 DIAGNOSIS — L89311 Pressure ulcer of right buttock, stage 1: Secondary | ICD-10-CM | POA: Diagnosis not present

## 2023-07-06 DIAGNOSIS — N182 Chronic kidney disease, stage 2 (mild): Secondary | ICD-10-CM | POA: Diagnosis not present

## 2023-07-06 DIAGNOSIS — L89892 Pressure ulcer of other site, stage 2: Secondary | ICD-10-CM | POA: Diagnosis not present

## 2023-07-06 DIAGNOSIS — I83893 Varicose veins of bilateral lower extremities with other complications: Secondary | ICD-10-CM | POA: Diagnosis not present

## 2023-07-09 DIAGNOSIS — I890 Lymphedema, not elsewhere classified: Secondary | ICD-10-CM | POA: Diagnosis not present

## 2023-07-09 DIAGNOSIS — N182 Chronic kidney disease, stage 2 (mild): Secondary | ICD-10-CM | POA: Diagnosis not present

## 2023-07-09 DIAGNOSIS — I872 Venous insufficiency (chronic) (peripheral): Secondary | ICD-10-CM | POA: Diagnosis not present

## 2023-07-09 DIAGNOSIS — D649 Anemia, unspecified: Secondary | ICD-10-CM | POA: Diagnosis not present

## 2023-07-09 DIAGNOSIS — L89892 Pressure ulcer of other site, stage 2: Secondary | ICD-10-CM | POA: Diagnosis not present

## 2023-07-09 DIAGNOSIS — I83893 Varicose veins of bilateral lower extremities with other complications: Secondary | ICD-10-CM | POA: Diagnosis not present

## 2023-07-09 DIAGNOSIS — I129 Hypertensive chronic kidney disease with stage 1 through stage 4 chronic kidney disease, or unspecified chronic kidney disease: Secondary | ICD-10-CM | POA: Diagnosis not present

## 2023-07-09 DIAGNOSIS — G40309 Generalized idiopathic epilepsy and epileptic syndromes, not intractable, without status epilepticus: Secondary | ICD-10-CM | POA: Diagnosis not present

## 2023-07-09 DIAGNOSIS — L89311 Pressure ulcer of right buttock, stage 1: Secondary | ICD-10-CM | POA: Diagnosis not present

## 2023-07-12 DIAGNOSIS — I83893 Varicose veins of bilateral lower extremities with other complications: Secondary | ICD-10-CM | POA: Diagnosis not present

## 2023-07-12 DIAGNOSIS — I890 Lymphedema, not elsewhere classified: Secondary | ICD-10-CM | POA: Diagnosis not present

## 2023-07-12 DIAGNOSIS — L89892 Pressure ulcer of other site, stage 2: Secondary | ICD-10-CM | POA: Diagnosis not present

## 2023-07-12 DIAGNOSIS — G40309 Generalized idiopathic epilepsy and epileptic syndromes, not intractable, without status epilepticus: Secondary | ICD-10-CM | POA: Diagnosis not present

## 2023-07-12 DIAGNOSIS — I872 Venous insufficiency (chronic) (peripheral): Secondary | ICD-10-CM | POA: Diagnosis not present

## 2023-07-12 DIAGNOSIS — N182 Chronic kidney disease, stage 2 (mild): Secondary | ICD-10-CM | POA: Diagnosis not present

## 2023-07-12 DIAGNOSIS — L89311 Pressure ulcer of right buttock, stage 1: Secondary | ICD-10-CM | POA: Diagnosis not present

## 2023-07-12 DIAGNOSIS — I129 Hypertensive chronic kidney disease with stage 1 through stage 4 chronic kidney disease, or unspecified chronic kidney disease: Secondary | ICD-10-CM | POA: Diagnosis not present

## 2023-07-12 DIAGNOSIS — D649 Anemia, unspecified: Secondary | ICD-10-CM | POA: Diagnosis not present

## 2023-07-13 DIAGNOSIS — G40309 Generalized idiopathic epilepsy and epileptic syndromes, not intractable, without status epilepticus: Secondary | ICD-10-CM | POA: Diagnosis not present

## 2023-07-13 DIAGNOSIS — L89311 Pressure ulcer of right buttock, stage 1: Secondary | ICD-10-CM | POA: Diagnosis not present

## 2023-07-13 DIAGNOSIS — L89892 Pressure ulcer of other site, stage 2: Secondary | ICD-10-CM | POA: Diagnosis not present

## 2023-07-13 DIAGNOSIS — I872 Venous insufficiency (chronic) (peripheral): Secondary | ICD-10-CM | POA: Diagnosis not present

## 2023-07-13 DIAGNOSIS — I890 Lymphedema, not elsewhere classified: Secondary | ICD-10-CM | POA: Diagnosis not present

## 2023-07-13 DIAGNOSIS — I83893 Varicose veins of bilateral lower extremities with other complications: Secondary | ICD-10-CM | POA: Diagnosis not present

## 2023-07-13 DIAGNOSIS — N182 Chronic kidney disease, stage 2 (mild): Secondary | ICD-10-CM | POA: Diagnosis not present

## 2023-07-13 DIAGNOSIS — I129 Hypertensive chronic kidney disease with stage 1 through stage 4 chronic kidney disease, or unspecified chronic kidney disease: Secondary | ICD-10-CM | POA: Diagnosis not present

## 2023-07-13 DIAGNOSIS — D649 Anemia, unspecified: Secondary | ICD-10-CM | POA: Diagnosis not present

## 2023-07-19 DIAGNOSIS — I129 Hypertensive chronic kidney disease with stage 1 through stage 4 chronic kidney disease, or unspecified chronic kidney disease: Secondary | ICD-10-CM | POA: Diagnosis not present

## 2023-07-19 DIAGNOSIS — I890 Lymphedema, not elsewhere classified: Secondary | ICD-10-CM | POA: Diagnosis not present

## 2023-07-19 DIAGNOSIS — L89892 Pressure ulcer of other site, stage 2: Secondary | ICD-10-CM | POA: Diagnosis not present

## 2023-07-19 DIAGNOSIS — I83893 Varicose veins of bilateral lower extremities with other complications: Secondary | ICD-10-CM | POA: Diagnosis not present

## 2023-07-19 DIAGNOSIS — N182 Chronic kidney disease, stage 2 (mild): Secondary | ICD-10-CM | POA: Diagnosis not present

## 2023-07-19 DIAGNOSIS — I872 Venous insufficiency (chronic) (peripheral): Secondary | ICD-10-CM | POA: Diagnosis not present

## 2023-07-19 DIAGNOSIS — D649 Anemia, unspecified: Secondary | ICD-10-CM | POA: Diagnosis not present

## 2023-07-19 DIAGNOSIS — L89311 Pressure ulcer of right buttock, stage 1: Secondary | ICD-10-CM | POA: Diagnosis not present

## 2023-07-19 DIAGNOSIS — G40309 Generalized idiopathic epilepsy and epileptic syndromes, not intractable, without status epilepticus: Secondary | ICD-10-CM | POA: Diagnosis not present

## 2023-07-20 DIAGNOSIS — I83893 Varicose veins of bilateral lower extremities with other complications: Secondary | ICD-10-CM | POA: Diagnosis not present

## 2023-07-20 DIAGNOSIS — N182 Chronic kidney disease, stage 2 (mild): Secondary | ICD-10-CM | POA: Diagnosis not present

## 2023-07-20 DIAGNOSIS — L89892 Pressure ulcer of other site, stage 2: Secondary | ICD-10-CM | POA: Diagnosis not present

## 2023-07-20 DIAGNOSIS — L89311 Pressure ulcer of right buttock, stage 1: Secondary | ICD-10-CM | POA: Diagnosis not present

## 2023-07-20 DIAGNOSIS — G40309 Generalized idiopathic epilepsy and epileptic syndromes, not intractable, without status epilepticus: Secondary | ICD-10-CM | POA: Diagnosis not present

## 2023-07-20 DIAGNOSIS — I890 Lymphedema, not elsewhere classified: Secondary | ICD-10-CM | POA: Diagnosis not present

## 2023-07-20 DIAGNOSIS — I129 Hypertensive chronic kidney disease with stage 1 through stage 4 chronic kidney disease, or unspecified chronic kidney disease: Secondary | ICD-10-CM | POA: Diagnosis not present

## 2023-07-20 DIAGNOSIS — D649 Anemia, unspecified: Secondary | ICD-10-CM | POA: Diagnosis not present

## 2023-07-20 DIAGNOSIS — I872 Venous insufficiency (chronic) (peripheral): Secondary | ICD-10-CM | POA: Diagnosis not present

## 2023-07-22 ENCOUNTER — Other Ambulatory Visit: Payer: Self-pay | Admitting: Neurology

## 2023-07-22 NOTE — Telephone Encounter (Signed)
 Last seen 12/29/22, next appt scheduled 12/29/23 Dispenses   Dispensed Days Supply Quantity Provider Pharmacy  PHENOBARBITAL 32.4 MG TABLET 06/22/2023 30 30 each Sater, Pearletha Furl, MD [facility] [phone] - G...  PHENOBARBITAL 32.4 MG TABLET 05/22/2023 30 30 each Sater, Pearletha Furl, MD [facility] [phone] - G...  PHENOBARBITAL 32.4 MG TABLET 04/21/2023 30 30 each Sater, Pearletha Furl, MD [facility] [phone] - G...  PHENOBARBITAL 32.4 MG TABLET 03/16/2023 30 30 each Sater, Pearletha Furl, MD [facility] [phone] - G...  PHENOBARBITAL 32.4 MG TABLET 02/10/2023 30 30 each Sater, Pearletha Furl, MD [facility] [phone] - G...  PHENOBARBITAL 32.4 MG TABLET 01/12/2023 30 30 each Sater, Pearletha Furl, MD [facility] [phone] - G...  PHENOBARBITAL 32.4 MG TABLET 12/11/2022 30 30 each Sater, Pearletha Furl, MD [facility] [phone] - G...  PHENOBARBITAL 32.4 MG TABLET 11/12/2022 30 30 each Sater, Pearletha Furl, MD [facility] [phone] - G...  PHENOBARBITAL 32.4 MG TABLET 10/12/2022 30 30 each Sater, Pearletha Furl, MD [facility] [phone] - G...  PHENOBARBITAL 32.4 MG TABLET 09/10/2022 30 30 each Sater, Pearletha Furl, MD [facility] [phone] - G.Marland KitchenMarland Kitchen

## 2023-07-23 DIAGNOSIS — D649 Anemia, unspecified: Secondary | ICD-10-CM | POA: Diagnosis not present

## 2023-07-23 DIAGNOSIS — G40309 Generalized idiopathic epilepsy and epileptic syndromes, not intractable, without status epilepticus: Secondary | ICD-10-CM | POA: Diagnosis not present

## 2023-07-23 DIAGNOSIS — I890 Lymphedema, not elsewhere classified: Secondary | ICD-10-CM | POA: Diagnosis not present

## 2023-07-23 DIAGNOSIS — L89892 Pressure ulcer of other site, stage 2: Secondary | ICD-10-CM | POA: Diagnosis not present

## 2023-07-23 DIAGNOSIS — L89311 Pressure ulcer of right buttock, stage 1: Secondary | ICD-10-CM | POA: Diagnosis not present

## 2023-07-23 DIAGNOSIS — I83893 Varicose veins of bilateral lower extremities with other complications: Secondary | ICD-10-CM | POA: Diagnosis not present

## 2023-07-23 DIAGNOSIS — I872 Venous insufficiency (chronic) (peripheral): Secondary | ICD-10-CM | POA: Diagnosis not present

## 2023-07-23 DIAGNOSIS — I129 Hypertensive chronic kidney disease with stage 1 through stage 4 chronic kidney disease, or unspecified chronic kidney disease: Secondary | ICD-10-CM | POA: Diagnosis not present

## 2023-07-23 DIAGNOSIS — N182 Chronic kidney disease, stage 2 (mild): Secondary | ICD-10-CM | POA: Diagnosis not present

## 2023-07-26 DIAGNOSIS — I890 Lymphedema, not elsewhere classified: Secondary | ICD-10-CM | POA: Diagnosis not present

## 2023-07-26 DIAGNOSIS — N182 Chronic kidney disease, stage 2 (mild): Secondary | ICD-10-CM | POA: Diagnosis not present

## 2023-07-26 DIAGNOSIS — D649 Anemia, unspecified: Secondary | ICD-10-CM | POA: Diagnosis not present

## 2023-07-26 DIAGNOSIS — G40309 Generalized idiopathic epilepsy and epileptic syndromes, not intractable, without status epilepticus: Secondary | ICD-10-CM | POA: Diagnosis not present

## 2023-07-26 DIAGNOSIS — I872 Venous insufficiency (chronic) (peripheral): Secondary | ICD-10-CM | POA: Diagnosis not present

## 2023-07-26 DIAGNOSIS — I129 Hypertensive chronic kidney disease with stage 1 through stage 4 chronic kidney disease, or unspecified chronic kidney disease: Secondary | ICD-10-CM | POA: Diagnosis not present

## 2023-07-26 DIAGNOSIS — I83893 Varicose veins of bilateral lower extremities with other complications: Secondary | ICD-10-CM | POA: Diagnosis not present

## 2023-07-26 DIAGNOSIS — L89311 Pressure ulcer of right buttock, stage 1: Secondary | ICD-10-CM | POA: Diagnosis not present

## 2023-07-26 DIAGNOSIS — L89892 Pressure ulcer of other site, stage 2: Secondary | ICD-10-CM | POA: Diagnosis not present

## 2023-07-27 DIAGNOSIS — I129 Hypertensive chronic kidney disease with stage 1 through stage 4 chronic kidney disease, or unspecified chronic kidney disease: Secondary | ICD-10-CM | POA: Diagnosis not present

## 2023-07-27 DIAGNOSIS — D649 Anemia, unspecified: Secondary | ICD-10-CM | POA: Diagnosis not present

## 2023-07-27 DIAGNOSIS — N182 Chronic kidney disease, stage 2 (mild): Secondary | ICD-10-CM | POA: Diagnosis not present

## 2023-07-27 DIAGNOSIS — G40309 Generalized idiopathic epilepsy and epileptic syndromes, not intractable, without status epilepticus: Secondary | ICD-10-CM | POA: Diagnosis not present

## 2023-07-27 DIAGNOSIS — L89892 Pressure ulcer of other site, stage 2: Secondary | ICD-10-CM | POA: Diagnosis not present

## 2023-07-27 DIAGNOSIS — L89311 Pressure ulcer of right buttock, stage 1: Secondary | ICD-10-CM | POA: Diagnosis not present

## 2023-07-27 DIAGNOSIS — I890 Lymphedema, not elsewhere classified: Secondary | ICD-10-CM | POA: Diagnosis not present

## 2023-07-27 DIAGNOSIS — I872 Venous insufficiency (chronic) (peripheral): Secondary | ICD-10-CM | POA: Diagnosis not present

## 2023-07-27 DIAGNOSIS — I83893 Varicose veins of bilateral lower extremities with other complications: Secondary | ICD-10-CM | POA: Diagnosis not present

## 2023-07-29 DIAGNOSIS — Z79899 Other long term (current) drug therapy: Secondary | ICD-10-CM | POA: Diagnosis not present

## 2023-07-29 DIAGNOSIS — D649 Anemia, unspecified: Secondary | ICD-10-CM | POA: Diagnosis not present

## 2023-07-29 DIAGNOSIS — E7800 Pure hypercholesterolemia, unspecified: Secondary | ICD-10-CM | POA: Diagnosis not present

## 2023-07-29 DIAGNOSIS — R350 Frequency of micturition: Secondary | ICD-10-CM | POA: Diagnosis not present

## 2023-07-29 DIAGNOSIS — E876 Hypokalemia: Secondary | ICD-10-CM | POA: Diagnosis not present

## 2023-07-29 DIAGNOSIS — E559 Vitamin D deficiency, unspecified: Secondary | ICD-10-CM | POA: Diagnosis not present

## 2023-07-29 DIAGNOSIS — E538 Deficiency of other specified B group vitamins: Secondary | ICD-10-CM | POA: Diagnosis not present

## 2023-07-29 DIAGNOSIS — I10 Essential (primary) hypertension: Secondary | ICD-10-CM | POA: Diagnosis not present

## 2023-07-29 DIAGNOSIS — Z0000 Encounter for general adult medical examination without abnormal findings: Secondary | ICD-10-CM | POA: Diagnosis not present

## 2023-07-30 DIAGNOSIS — I83893 Varicose veins of bilateral lower extremities with other complications: Secondary | ICD-10-CM | POA: Diagnosis not present

## 2023-07-30 DIAGNOSIS — M1990 Unspecified osteoarthritis, unspecified site: Secondary | ICD-10-CM | POA: Diagnosis not present

## 2023-07-30 DIAGNOSIS — G40309 Generalized idiopathic epilepsy and epileptic syndromes, not intractable, without status epilepticus: Secondary | ICD-10-CM | POA: Diagnosis not present

## 2023-07-30 DIAGNOSIS — L97901 Non-pressure chronic ulcer of unspecified part of unspecified lower leg limited to breakdown of skin: Secondary | ICD-10-CM | POA: Diagnosis not present

## 2023-07-30 DIAGNOSIS — E785 Hyperlipidemia, unspecified: Secondary | ICD-10-CM | POA: Diagnosis not present

## 2023-07-30 DIAGNOSIS — M545 Low back pain, unspecified: Secondary | ICD-10-CM | POA: Diagnosis not present

## 2023-07-30 DIAGNOSIS — K760 Fatty (change of) liver, not elsewhere classified: Secondary | ICD-10-CM | POA: Diagnosis not present

## 2023-07-30 DIAGNOSIS — E876 Hypokalemia: Secondary | ICD-10-CM | POA: Diagnosis not present

## 2023-07-30 DIAGNOSIS — L8996 Pressure-induced deep tissue damage of unspecified site: Secondary | ICD-10-CM | POA: Diagnosis not present

## 2023-07-30 DIAGNOSIS — F419 Anxiety disorder, unspecified: Secondary | ICD-10-CM | POA: Diagnosis not present

## 2023-07-30 DIAGNOSIS — R569 Unspecified convulsions: Secondary | ICD-10-CM | POA: Diagnosis not present

## 2023-07-30 DIAGNOSIS — J4489 Other specified chronic obstructive pulmonary disease: Secondary | ICD-10-CM | POA: Diagnosis not present

## 2023-07-30 DIAGNOSIS — L89302 Pressure ulcer of unspecified buttock, stage 2: Secondary | ICD-10-CM | POA: Diagnosis not present

## 2023-07-30 DIAGNOSIS — I700 Atherosclerosis of aorta: Secondary | ICD-10-CM | POA: Diagnosis not present

## 2023-07-30 DIAGNOSIS — Z79899 Other long term (current) drug therapy: Secondary | ICD-10-CM | POA: Diagnosis not present

## 2023-07-30 DIAGNOSIS — L89892 Pressure ulcer of other site, stage 2: Secondary | ICD-10-CM | POA: Diagnosis not present

## 2023-07-30 DIAGNOSIS — M8580 Other specified disorders of bone density and structure, unspecified site: Secondary | ICD-10-CM | POA: Diagnosis not present

## 2023-07-30 DIAGNOSIS — I872 Venous insufficiency (chronic) (peripheral): Secondary | ICD-10-CM | POA: Diagnosis not present

## 2023-07-30 DIAGNOSIS — N1831 Chronic kidney disease, stage 3a: Secondary | ICD-10-CM | POA: Diagnosis not present

## 2023-07-30 DIAGNOSIS — I129 Hypertensive chronic kidney disease with stage 1 through stage 4 chronic kidney disease, or unspecified chronic kidney disease: Secondary | ICD-10-CM | POA: Diagnosis not present

## 2023-07-30 DIAGNOSIS — I720 Aneurysm of carotid artery: Secondary | ICD-10-CM | POA: Diagnosis not present

## 2023-07-30 DIAGNOSIS — N182 Chronic kidney disease, stage 2 (mild): Secondary | ICD-10-CM | POA: Diagnosis not present

## 2023-07-30 DIAGNOSIS — G4733 Obstructive sleep apnea (adult) (pediatric): Secondary | ICD-10-CM | POA: Diagnosis not present

## 2023-07-30 DIAGNOSIS — M2040 Other hammer toe(s) (acquired), unspecified foot: Secondary | ICD-10-CM | POA: Diagnosis not present

## 2023-07-30 DIAGNOSIS — D649 Anemia, unspecified: Secondary | ICD-10-CM | POA: Diagnosis not present

## 2023-07-30 DIAGNOSIS — J301 Allergic rhinitis due to pollen: Secondary | ICD-10-CM | POA: Diagnosis not present

## 2023-07-30 DIAGNOSIS — Z87892 Personal history of anaphylaxis: Secondary | ICD-10-CM | POA: Diagnosis not present

## 2023-07-30 DIAGNOSIS — L89311 Pressure ulcer of right buttock, stage 1: Secondary | ICD-10-CM | POA: Diagnosis not present

## 2023-07-30 DIAGNOSIS — M4800 Spinal stenosis, site unspecified: Secondary | ICD-10-CM | POA: Diagnosis not present

## 2023-07-30 DIAGNOSIS — I890 Lymphedema, not elsewhere classified: Secondary | ICD-10-CM | POA: Diagnosis not present

## 2023-07-30 DIAGNOSIS — K589 Irritable bowel syndrome without diarrhea: Secondary | ICD-10-CM | POA: Diagnosis not present

## 2023-07-30 DIAGNOSIS — G629 Polyneuropathy, unspecified: Secondary | ICD-10-CM | POA: Diagnosis not present

## 2023-07-31 DIAGNOSIS — G4733 Obstructive sleep apnea (adult) (pediatric): Secondary | ICD-10-CM | POA: Diagnosis not present

## 2023-08-04 DIAGNOSIS — I129 Hypertensive chronic kidney disease with stage 1 through stage 4 chronic kidney disease, or unspecified chronic kidney disease: Secondary | ICD-10-CM | POA: Diagnosis not present

## 2023-08-04 DIAGNOSIS — I83893 Varicose veins of bilateral lower extremities with other complications: Secondary | ICD-10-CM | POA: Diagnosis not present

## 2023-08-04 DIAGNOSIS — L89311 Pressure ulcer of right buttock, stage 1: Secondary | ICD-10-CM | POA: Diagnosis not present

## 2023-08-04 DIAGNOSIS — L89892 Pressure ulcer of other site, stage 2: Secondary | ICD-10-CM | POA: Diagnosis not present

## 2023-08-04 DIAGNOSIS — I872 Venous insufficiency (chronic) (peripheral): Secondary | ICD-10-CM | POA: Diagnosis not present

## 2023-08-04 DIAGNOSIS — D649 Anemia, unspecified: Secondary | ICD-10-CM | POA: Diagnosis not present

## 2023-08-04 DIAGNOSIS — I890 Lymphedema, not elsewhere classified: Secondary | ICD-10-CM | POA: Diagnosis not present

## 2023-08-04 DIAGNOSIS — N182 Chronic kidney disease, stage 2 (mild): Secondary | ICD-10-CM | POA: Diagnosis not present

## 2023-08-04 DIAGNOSIS — G40309 Generalized idiopathic epilepsy and epileptic syndromes, not intractable, without status epilepticus: Secondary | ICD-10-CM | POA: Diagnosis not present

## 2023-08-06 DIAGNOSIS — D649 Anemia, unspecified: Secondary | ICD-10-CM | POA: Diagnosis not present

## 2023-08-06 DIAGNOSIS — I890 Lymphedema, not elsewhere classified: Secondary | ICD-10-CM | POA: Diagnosis not present

## 2023-08-06 DIAGNOSIS — I129 Hypertensive chronic kidney disease with stage 1 through stage 4 chronic kidney disease, or unspecified chronic kidney disease: Secondary | ICD-10-CM | POA: Diagnosis not present

## 2023-08-06 DIAGNOSIS — G40309 Generalized idiopathic epilepsy and epileptic syndromes, not intractable, without status epilepticus: Secondary | ICD-10-CM | POA: Diagnosis not present

## 2023-08-06 DIAGNOSIS — I83893 Varicose veins of bilateral lower extremities with other complications: Secondary | ICD-10-CM | POA: Diagnosis not present

## 2023-08-06 DIAGNOSIS — L89892 Pressure ulcer of other site, stage 2: Secondary | ICD-10-CM | POA: Diagnosis not present

## 2023-08-06 DIAGNOSIS — L89311 Pressure ulcer of right buttock, stage 1: Secondary | ICD-10-CM | POA: Diagnosis not present

## 2023-08-06 DIAGNOSIS — I872 Venous insufficiency (chronic) (peripheral): Secondary | ICD-10-CM | POA: Diagnosis not present

## 2023-08-06 DIAGNOSIS — N182 Chronic kidney disease, stage 2 (mild): Secondary | ICD-10-CM | POA: Diagnosis not present

## 2023-08-10 DIAGNOSIS — L89892 Pressure ulcer of other site, stage 2: Secondary | ICD-10-CM | POA: Diagnosis not present

## 2023-08-10 DIAGNOSIS — I129 Hypertensive chronic kidney disease with stage 1 through stage 4 chronic kidney disease, or unspecified chronic kidney disease: Secondary | ICD-10-CM | POA: Diagnosis not present

## 2023-08-10 DIAGNOSIS — I83893 Varicose veins of bilateral lower extremities with other complications: Secondary | ICD-10-CM | POA: Diagnosis not present

## 2023-08-10 DIAGNOSIS — L89312 Pressure ulcer of right buttock, stage 2: Secondary | ICD-10-CM | POA: Diagnosis not present

## 2023-08-10 DIAGNOSIS — I890 Lymphedema, not elsewhere classified: Secondary | ICD-10-CM | POA: Diagnosis not present

## 2023-08-10 DIAGNOSIS — D649 Anemia, unspecified: Secondary | ICD-10-CM | POA: Diagnosis not present

## 2023-08-10 DIAGNOSIS — N182 Chronic kidney disease, stage 2 (mild): Secondary | ICD-10-CM | POA: Diagnosis not present

## 2023-08-10 DIAGNOSIS — I872 Venous insufficiency (chronic) (peripheral): Secondary | ICD-10-CM | POA: Diagnosis not present

## 2023-08-10 DIAGNOSIS — G40309 Generalized idiopathic epilepsy and epileptic syndromes, not intractable, without status epilepticus: Secondary | ICD-10-CM | POA: Diagnosis not present

## 2023-08-13 DIAGNOSIS — L89324 Pressure ulcer of left buttock, stage 4: Secondary | ICD-10-CM | POA: Diagnosis not present

## 2023-08-13 DIAGNOSIS — L89313 Pressure ulcer of right buttock, stage 3: Secondary | ICD-10-CM | POA: Diagnosis not present

## 2023-08-13 DIAGNOSIS — L89323 Pressure ulcer of left buttock, stage 3: Secondary | ICD-10-CM | POA: Diagnosis not present

## 2023-08-13 DIAGNOSIS — I872 Venous insufficiency (chronic) (peripheral): Secondary | ICD-10-CM | POA: Diagnosis not present

## 2023-08-13 DIAGNOSIS — S81802D Unspecified open wound, left lower leg, subsequent encounter: Secondary | ICD-10-CM | POA: Diagnosis not present

## 2023-08-13 DIAGNOSIS — I890 Lymphedema, not elsewhere classified: Secondary | ICD-10-CM | POA: Diagnosis not present

## 2023-08-13 DIAGNOSIS — L89322 Pressure ulcer of left buttock, stage 2: Secondary | ICD-10-CM | POA: Diagnosis not present

## 2023-08-13 DIAGNOSIS — L03116 Cellulitis of left lower limb: Secondary | ICD-10-CM | POA: Diagnosis not present

## 2023-08-13 DIAGNOSIS — S81801D Unspecified open wound, right lower leg, subsequent encounter: Secondary | ICD-10-CM | POA: Diagnosis not present

## 2023-08-19 DIAGNOSIS — N182 Chronic kidney disease, stage 2 (mild): Secondary | ICD-10-CM | POA: Diagnosis not present

## 2023-08-19 DIAGNOSIS — I129 Hypertensive chronic kidney disease with stage 1 through stage 4 chronic kidney disease, or unspecified chronic kidney disease: Secondary | ICD-10-CM | POA: Diagnosis not present

## 2023-08-19 DIAGNOSIS — G40309 Generalized idiopathic epilepsy and epileptic syndromes, not intractable, without status epilepticus: Secondary | ICD-10-CM | POA: Diagnosis not present

## 2023-08-19 DIAGNOSIS — L89892 Pressure ulcer of other site, stage 2: Secondary | ICD-10-CM | POA: Diagnosis not present

## 2023-08-19 DIAGNOSIS — I872 Venous insufficiency (chronic) (peripheral): Secondary | ICD-10-CM | POA: Diagnosis not present

## 2023-08-19 DIAGNOSIS — D649 Anemia, unspecified: Secondary | ICD-10-CM | POA: Diagnosis not present

## 2023-08-19 DIAGNOSIS — I890 Lymphedema, not elsewhere classified: Secondary | ICD-10-CM | POA: Diagnosis not present

## 2023-08-19 DIAGNOSIS — I83893 Varicose veins of bilateral lower extremities with other complications: Secondary | ICD-10-CM | POA: Diagnosis not present

## 2023-08-19 DIAGNOSIS — L89312 Pressure ulcer of right buttock, stage 2: Secondary | ICD-10-CM | POA: Diagnosis not present

## 2023-08-20 DIAGNOSIS — G40309 Generalized idiopathic epilepsy and epileptic syndromes, not intractable, without status epilepticus: Secondary | ICD-10-CM | POA: Diagnosis not present

## 2023-08-20 DIAGNOSIS — D649 Anemia, unspecified: Secondary | ICD-10-CM | POA: Diagnosis not present

## 2023-08-20 DIAGNOSIS — N182 Chronic kidney disease, stage 2 (mild): Secondary | ICD-10-CM | POA: Diagnosis not present

## 2023-08-20 DIAGNOSIS — L89892 Pressure ulcer of other site, stage 2: Secondary | ICD-10-CM | POA: Diagnosis not present

## 2023-08-20 DIAGNOSIS — I890 Lymphedema, not elsewhere classified: Secondary | ICD-10-CM | POA: Diagnosis not present

## 2023-08-20 DIAGNOSIS — I129 Hypertensive chronic kidney disease with stage 1 through stage 4 chronic kidney disease, or unspecified chronic kidney disease: Secondary | ICD-10-CM | POA: Diagnosis not present

## 2023-08-20 DIAGNOSIS — I83893 Varicose veins of bilateral lower extremities with other complications: Secondary | ICD-10-CM | POA: Diagnosis not present

## 2023-08-20 DIAGNOSIS — I872 Venous insufficiency (chronic) (peripheral): Secondary | ICD-10-CM | POA: Diagnosis not present

## 2023-08-20 DIAGNOSIS — L89312 Pressure ulcer of right buttock, stage 2: Secondary | ICD-10-CM | POA: Diagnosis not present

## 2023-08-24 DIAGNOSIS — D649 Anemia, unspecified: Secondary | ICD-10-CM | POA: Diagnosis not present

## 2023-08-24 DIAGNOSIS — I129 Hypertensive chronic kidney disease with stage 1 through stage 4 chronic kidney disease, or unspecified chronic kidney disease: Secondary | ICD-10-CM | POA: Diagnosis not present

## 2023-08-24 DIAGNOSIS — I890 Lymphedema, not elsewhere classified: Secondary | ICD-10-CM | POA: Diagnosis not present

## 2023-08-24 DIAGNOSIS — L89892 Pressure ulcer of other site, stage 2: Secondary | ICD-10-CM | POA: Diagnosis not present

## 2023-08-24 DIAGNOSIS — I872 Venous insufficiency (chronic) (peripheral): Secondary | ICD-10-CM | POA: Diagnosis not present

## 2023-08-24 DIAGNOSIS — G40309 Generalized idiopathic epilepsy and epileptic syndromes, not intractable, without status epilepticus: Secondary | ICD-10-CM | POA: Diagnosis not present

## 2023-08-24 DIAGNOSIS — N182 Chronic kidney disease, stage 2 (mild): Secondary | ICD-10-CM | POA: Diagnosis not present

## 2023-08-24 DIAGNOSIS — I83893 Varicose veins of bilateral lower extremities with other complications: Secondary | ICD-10-CM | POA: Diagnosis not present

## 2023-08-24 DIAGNOSIS — L89312 Pressure ulcer of right buttock, stage 2: Secondary | ICD-10-CM | POA: Diagnosis not present

## 2023-08-25 DIAGNOSIS — G40309 Generalized idiopathic epilepsy and epileptic syndromes, not intractable, without status epilepticus: Secondary | ICD-10-CM | POA: Diagnosis not present

## 2023-08-25 DIAGNOSIS — D649 Anemia, unspecified: Secondary | ICD-10-CM | POA: Diagnosis not present

## 2023-08-25 DIAGNOSIS — I129 Hypertensive chronic kidney disease with stage 1 through stage 4 chronic kidney disease, or unspecified chronic kidney disease: Secondary | ICD-10-CM | POA: Diagnosis not present

## 2023-08-25 DIAGNOSIS — N182 Chronic kidney disease, stage 2 (mild): Secondary | ICD-10-CM | POA: Diagnosis not present

## 2023-08-25 DIAGNOSIS — L89312 Pressure ulcer of right buttock, stage 2: Secondary | ICD-10-CM | POA: Diagnosis not present

## 2023-08-25 DIAGNOSIS — I890 Lymphedema, not elsewhere classified: Secondary | ICD-10-CM | POA: Diagnosis not present

## 2023-08-25 DIAGNOSIS — I83893 Varicose veins of bilateral lower extremities with other complications: Secondary | ICD-10-CM | POA: Diagnosis not present

## 2023-08-25 DIAGNOSIS — L89892 Pressure ulcer of other site, stage 2: Secondary | ICD-10-CM | POA: Diagnosis not present

## 2023-08-25 DIAGNOSIS — I872 Venous insufficiency (chronic) (peripheral): Secondary | ICD-10-CM | POA: Diagnosis not present

## 2023-08-26 DIAGNOSIS — I872 Venous insufficiency (chronic) (peripheral): Secondary | ICD-10-CM | POA: Diagnosis not present

## 2023-08-26 DIAGNOSIS — I890 Lymphedema, not elsewhere classified: Secondary | ICD-10-CM | POA: Diagnosis not present

## 2023-08-26 DIAGNOSIS — G40309 Generalized idiopathic epilepsy and epileptic syndromes, not intractable, without status epilepticus: Secondary | ICD-10-CM | POA: Diagnosis not present

## 2023-08-26 DIAGNOSIS — I129 Hypertensive chronic kidney disease with stage 1 through stage 4 chronic kidney disease, or unspecified chronic kidney disease: Secondary | ICD-10-CM | POA: Diagnosis not present

## 2023-08-26 DIAGNOSIS — I83893 Varicose veins of bilateral lower extremities with other complications: Secondary | ICD-10-CM | POA: Diagnosis not present

## 2023-08-26 DIAGNOSIS — N182 Chronic kidney disease, stage 2 (mild): Secondary | ICD-10-CM | POA: Diagnosis not present

## 2023-08-26 DIAGNOSIS — L89892 Pressure ulcer of other site, stage 2: Secondary | ICD-10-CM | POA: Diagnosis not present

## 2023-08-26 DIAGNOSIS — D649 Anemia, unspecified: Secondary | ICD-10-CM | POA: Diagnosis not present

## 2023-08-26 DIAGNOSIS — L89312 Pressure ulcer of right buttock, stage 2: Secondary | ICD-10-CM | POA: Diagnosis not present

## 2023-08-27 DIAGNOSIS — G40309 Generalized idiopathic epilepsy and epileptic syndromes, not intractable, without status epilepticus: Secondary | ICD-10-CM | POA: Diagnosis not present

## 2023-08-27 DIAGNOSIS — L89892 Pressure ulcer of other site, stage 2: Secondary | ICD-10-CM | POA: Diagnosis not present

## 2023-08-27 DIAGNOSIS — I872 Venous insufficiency (chronic) (peripheral): Secondary | ICD-10-CM | POA: Diagnosis not present

## 2023-08-27 DIAGNOSIS — I83893 Varicose veins of bilateral lower extremities with other complications: Secondary | ICD-10-CM | POA: Diagnosis not present

## 2023-08-27 DIAGNOSIS — N182 Chronic kidney disease, stage 2 (mild): Secondary | ICD-10-CM | POA: Diagnosis not present

## 2023-08-27 DIAGNOSIS — L89312 Pressure ulcer of right buttock, stage 2: Secondary | ICD-10-CM | POA: Diagnosis not present

## 2023-08-27 DIAGNOSIS — D649 Anemia, unspecified: Secondary | ICD-10-CM | POA: Diagnosis not present

## 2023-08-27 DIAGNOSIS — I129 Hypertensive chronic kidney disease with stage 1 through stage 4 chronic kidney disease, or unspecified chronic kidney disease: Secondary | ICD-10-CM | POA: Diagnosis not present

## 2023-08-27 DIAGNOSIS — I890 Lymphedema, not elsewhere classified: Secondary | ICD-10-CM | POA: Diagnosis not present

## 2023-08-30 DIAGNOSIS — I872 Venous insufficiency (chronic) (peripheral): Secondary | ICD-10-CM | POA: Diagnosis not present

## 2023-08-30 DIAGNOSIS — D649 Anemia, unspecified: Secondary | ICD-10-CM | POA: Diagnosis not present

## 2023-08-30 DIAGNOSIS — G40309 Generalized idiopathic epilepsy and epileptic syndromes, not intractable, without status epilepticus: Secondary | ICD-10-CM | POA: Diagnosis not present

## 2023-08-30 DIAGNOSIS — I129 Hypertensive chronic kidney disease with stage 1 through stage 4 chronic kidney disease, or unspecified chronic kidney disease: Secondary | ICD-10-CM | POA: Diagnosis not present

## 2023-08-30 DIAGNOSIS — N182 Chronic kidney disease, stage 2 (mild): Secondary | ICD-10-CM | POA: Diagnosis not present

## 2023-08-30 DIAGNOSIS — L89312 Pressure ulcer of right buttock, stage 2: Secondary | ICD-10-CM | POA: Diagnosis not present

## 2023-08-30 DIAGNOSIS — L89892 Pressure ulcer of other site, stage 2: Secondary | ICD-10-CM | POA: Diagnosis not present

## 2023-08-30 DIAGNOSIS — I890 Lymphedema, not elsewhere classified: Secondary | ICD-10-CM | POA: Diagnosis not present

## 2023-08-30 DIAGNOSIS — I83893 Varicose veins of bilateral lower extremities with other complications: Secondary | ICD-10-CM | POA: Diagnosis not present

## 2023-08-31 DIAGNOSIS — L89892 Pressure ulcer of other site, stage 2: Secondary | ICD-10-CM | POA: Diagnosis not present

## 2023-08-31 DIAGNOSIS — N182 Chronic kidney disease, stage 2 (mild): Secondary | ICD-10-CM | POA: Diagnosis not present

## 2023-08-31 DIAGNOSIS — I872 Venous insufficiency (chronic) (peripheral): Secondary | ICD-10-CM | POA: Diagnosis not present

## 2023-08-31 DIAGNOSIS — I129 Hypertensive chronic kidney disease with stage 1 through stage 4 chronic kidney disease, or unspecified chronic kidney disease: Secondary | ICD-10-CM | POA: Diagnosis not present

## 2023-08-31 DIAGNOSIS — L89312 Pressure ulcer of right buttock, stage 2: Secondary | ICD-10-CM | POA: Diagnosis not present

## 2023-08-31 DIAGNOSIS — D649 Anemia, unspecified: Secondary | ICD-10-CM | POA: Diagnosis not present

## 2023-08-31 DIAGNOSIS — I890 Lymphedema, not elsewhere classified: Secondary | ICD-10-CM | POA: Diagnosis not present

## 2023-08-31 DIAGNOSIS — I83893 Varicose veins of bilateral lower extremities with other complications: Secondary | ICD-10-CM | POA: Diagnosis not present

## 2023-08-31 DIAGNOSIS — G40309 Generalized idiopathic epilepsy and epileptic syndromes, not intractable, without status epilepticus: Secondary | ICD-10-CM | POA: Diagnosis not present

## 2023-09-02 DIAGNOSIS — L89313 Pressure ulcer of right buttock, stage 3: Secondary | ICD-10-CM | POA: Diagnosis not present

## 2023-09-02 DIAGNOSIS — L97821 Non-pressure chronic ulcer of other part of left lower leg limited to breakdown of skin: Secondary | ICD-10-CM | POA: Diagnosis not present

## 2023-09-02 DIAGNOSIS — L89323 Pressure ulcer of left buttock, stage 3: Secondary | ICD-10-CM | POA: Diagnosis not present

## 2023-09-02 DIAGNOSIS — L98411 Non-pressure chronic ulcer of buttock limited to breakdown of skin: Secondary | ICD-10-CM | POA: Diagnosis not present

## 2023-09-02 DIAGNOSIS — L97811 Non-pressure chronic ulcer of other part of right lower leg limited to breakdown of skin: Secondary | ICD-10-CM | POA: Diagnosis not present

## 2023-09-02 DIAGNOSIS — L03116 Cellulitis of left lower limb: Secondary | ICD-10-CM | POA: Diagnosis not present

## 2023-09-02 DIAGNOSIS — I872 Venous insufficiency (chronic) (peripheral): Secondary | ICD-10-CM | POA: Diagnosis not present

## 2023-09-02 DIAGNOSIS — L97111 Non-pressure chronic ulcer of right thigh limited to breakdown of skin: Secondary | ICD-10-CM | POA: Diagnosis not present

## 2023-09-02 DIAGNOSIS — I890 Lymphedema, not elsewhere classified: Secondary | ICD-10-CM | POA: Diagnosis not present

## 2023-09-02 DIAGNOSIS — S81801D Unspecified open wound, right lower leg, subsequent encounter: Secondary | ICD-10-CM | POA: Diagnosis not present

## 2023-09-02 DIAGNOSIS — S81802D Unspecified open wound, left lower leg, subsequent encounter: Secondary | ICD-10-CM | POA: Diagnosis not present

## 2023-09-03 ENCOUNTER — Encounter: Payer: Self-pay | Admitting: Podiatry

## 2023-09-03 ENCOUNTER — Ambulatory Visit: Admitting: Podiatry

## 2023-09-03 DIAGNOSIS — I129 Hypertensive chronic kidney disease with stage 1 through stage 4 chronic kidney disease, or unspecified chronic kidney disease: Secondary | ICD-10-CM | POA: Diagnosis not present

## 2023-09-03 DIAGNOSIS — L89892 Pressure ulcer of other site, stage 2: Secondary | ICD-10-CM | POA: Diagnosis not present

## 2023-09-03 DIAGNOSIS — I83893 Varicose veins of bilateral lower extremities with other complications: Secondary | ICD-10-CM | POA: Diagnosis not present

## 2023-09-03 DIAGNOSIS — M79674 Pain in right toe(s): Secondary | ICD-10-CM

## 2023-09-03 DIAGNOSIS — D649 Anemia, unspecified: Secondary | ICD-10-CM | POA: Diagnosis not present

## 2023-09-03 DIAGNOSIS — B351 Tinea unguium: Secondary | ICD-10-CM

## 2023-09-03 DIAGNOSIS — I872 Venous insufficiency (chronic) (peripheral): Secondary | ICD-10-CM | POA: Diagnosis not present

## 2023-09-03 DIAGNOSIS — L84 Corns and callosities: Secondary | ICD-10-CM

## 2023-09-03 DIAGNOSIS — M79675 Pain in left toe(s): Secondary | ICD-10-CM | POA: Diagnosis not present

## 2023-09-03 DIAGNOSIS — N182 Chronic kidney disease, stage 2 (mild): Secondary | ICD-10-CM | POA: Diagnosis not present

## 2023-09-03 DIAGNOSIS — I890 Lymphedema, not elsewhere classified: Secondary | ICD-10-CM | POA: Diagnosis not present

## 2023-09-03 DIAGNOSIS — L89312 Pressure ulcer of right buttock, stage 2: Secondary | ICD-10-CM | POA: Diagnosis not present

## 2023-09-03 DIAGNOSIS — G40309 Generalized idiopathic epilepsy and epileptic syndromes, not intractable, without status epilepticus: Secondary | ICD-10-CM | POA: Diagnosis not present

## 2023-09-06 ENCOUNTER — Encounter: Payer: Self-pay | Admitting: Podiatry

## 2023-09-06 NOTE — Progress Notes (Signed)
 This patient presents to the office for nail care and callus treatment.  She says the nails and callus are painful walking and wearing other shoes.  She is unable to self treat.  She has history of acute renal failure and lymphedema.  She presents to the office for evaluation and treatment.  General Appearance  Alert, conversant and in no acute stress.  Vascular  Dorsalis pedis and posterior tibial  pulses are  weakly palpable  bilaterally.  Capillary return is within normal limits  bilaterally. Temperature is within normal limits  bilaterally.  Neurologic  Senn-Weinstein monofilament wire test within normal limits  bilaterally. Muscle power within normal limits bilaterally.  Nails Thick disfigured discolored nails with subungual debris  from hallux to fifth toes bilaterally. No evidence of bacterial infection or drainage bilaterally.  Orthopedic  No limitations of motion  feet .  No crepitus or effusions noted.  No bony pathology or digital deformities noted.  Skin  normotropic skin with no porokeratosis noted bilaterally.  No signs of infections or ulcers noted.   Callus sub 1 right foot.  Onychomycosis  B/L  Callus right foot.  IE.  Debride callus with # 15 blade.  Debride nails with nail nipper and dremel tool.  RTC 3 months   Ruffin Cotton DPM

## 2023-09-07 DIAGNOSIS — N182 Chronic kidney disease, stage 2 (mild): Secondary | ICD-10-CM | POA: Diagnosis not present

## 2023-09-07 DIAGNOSIS — I83893 Varicose veins of bilateral lower extremities with other complications: Secondary | ICD-10-CM | POA: Diagnosis not present

## 2023-09-07 DIAGNOSIS — L89892 Pressure ulcer of other site, stage 2: Secondary | ICD-10-CM | POA: Diagnosis not present

## 2023-09-07 DIAGNOSIS — I129 Hypertensive chronic kidney disease with stage 1 through stage 4 chronic kidney disease, or unspecified chronic kidney disease: Secondary | ICD-10-CM | POA: Diagnosis not present

## 2023-09-07 DIAGNOSIS — I872 Venous insufficiency (chronic) (peripheral): Secondary | ICD-10-CM | POA: Diagnosis not present

## 2023-09-07 DIAGNOSIS — D649 Anemia, unspecified: Secondary | ICD-10-CM | POA: Diagnosis not present

## 2023-09-07 DIAGNOSIS — G40309 Generalized idiopathic epilepsy and epileptic syndromes, not intractable, without status epilepticus: Secondary | ICD-10-CM | POA: Diagnosis not present

## 2023-09-07 DIAGNOSIS — L89312 Pressure ulcer of right buttock, stage 2: Secondary | ICD-10-CM | POA: Diagnosis not present

## 2023-09-07 DIAGNOSIS — I890 Lymphedema, not elsewhere classified: Secondary | ICD-10-CM | POA: Diagnosis not present

## 2023-09-10 DIAGNOSIS — D649 Anemia, unspecified: Secondary | ICD-10-CM | POA: Diagnosis not present

## 2023-09-10 DIAGNOSIS — I872 Venous insufficiency (chronic) (peripheral): Secondary | ICD-10-CM | POA: Diagnosis not present

## 2023-09-10 DIAGNOSIS — I129 Hypertensive chronic kidney disease with stage 1 through stage 4 chronic kidney disease, or unspecified chronic kidney disease: Secondary | ICD-10-CM | POA: Diagnosis not present

## 2023-09-10 DIAGNOSIS — L89312 Pressure ulcer of right buttock, stage 2: Secondary | ICD-10-CM | POA: Diagnosis not present

## 2023-09-10 DIAGNOSIS — L89892 Pressure ulcer of other site, stage 2: Secondary | ICD-10-CM | POA: Diagnosis not present

## 2023-09-10 DIAGNOSIS — I83893 Varicose veins of bilateral lower extremities with other complications: Secondary | ICD-10-CM | POA: Diagnosis not present

## 2023-09-10 DIAGNOSIS — I890 Lymphedema, not elsewhere classified: Secondary | ICD-10-CM | POA: Diagnosis not present

## 2023-09-10 DIAGNOSIS — G40309 Generalized idiopathic epilepsy and epileptic syndromes, not intractable, without status epilepticus: Secondary | ICD-10-CM | POA: Diagnosis not present

## 2023-09-10 DIAGNOSIS — N182 Chronic kidney disease, stage 2 (mild): Secondary | ICD-10-CM | POA: Diagnosis not present

## 2023-09-13 DIAGNOSIS — G40309 Generalized idiopathic epilepsy and epileptic syndromes, not intractable, without status epilepticus: Secondary | ICD-10-CM | POA: Diagnosis not present

## 2023-09-13 DIAGNOSIS — I890 Lymphedema, not elsewhere classified: Secondary | ICD-10-CM | POA: Diagnosis not present

## 2023-09-13 DIAGNOSIS — D649 Anemia, unspecified: Secondary | ICD-10-CM | POA: Diagnosis not present

## 2023-09-13 DIAGNOSIS — I129 Hypertensive chronic kidney disease with stage 1 through stage 4 chronic kidney disease, or unspecified chronic kidney disease: Secondary | ICD-10-CM | POA: Diagnosis not present

## 2023-09-13 DIAGNOSIS — L89892 Pressure ulcer of other site, stage 2: Secondary | ICD-10-CM | POA: Diagnosis not present

## 2023-09-13 DIAGNOSIS — I872 Venous insufficiency (chronic) (peripheral): Secondary | ICD-10-CM | POA: Diagnosis not present

## 2023-09-13 DIAGNOSIS — N182 Chronic kidney disease, stage 2 (mild): Secondary | ICD-10-CM | POA: Diagnosis not present

## 2023-09-13 DIAGNOSIS — I83893 Varicose veins of bilateral lower extremities with other complications: Secondary | ICD-10-CM | POA: Diagnosis not present

## 2023-09-13 DIAGNOSIS — L89312 Pressure ulcer of right buttock, stage 2: Secondary | ICD-10-CM | POA: Diagnosis not present

## 2023-09-14 DIAGNOSIS — I129 Hypertensive chronic kidney disease with stage 1 through stage 4 chronic kidney disease, or unspecified chronic kidney disease: Secondary | ICD-10-CM | POA: Diagnosis not present

## 2023-09-14 DIAGNOSIS — D649 Anemia, unspecified: Secondary | ICD-10-CM | POA: Diagnosis not present

## 2023-09-14 DIAGNOSIS — L89312 Pressure ulcer of right buttock, stage 2: Secondary | ICD-10-CM | POA: Diagnosis not present

## 2023-09-14 DIAGNOSIS — N182 Chronic kidney disease, stage 2 (mild): Secondary | ICD-10-CM | POA: Diagnosis not present

## 2023-09-14 DIAGNOSIS — I83893 Varicose veins of bilateral lower extremities with other complications: Secondary | ICD-10-CM | POA: Diagnosis not present

## 2023-09-14 DIAGNOSIS — I890 Lymphedema, not elsewhere classified: Secondary | ICD-10-CM | POA: Diagnosis not present

## 2023-09-14 DIAGNOSIS — I872 Venous insufficiency (chronic) (peripheral): Secondary | ICD-10-CM | POA: Diagnosis not present

## 2023-09-14 DIAGNOSIS — G40309 Generalized idiopathic epilepsy and epileptic syndromes, not intractable, without status epilepticus: Secondary | ICD-10-CM | POA: Diagnosis not present

## 2023-09-14 DIAGNOSIS — L89892 Pressure ulcer of other site, stage 2: Secondary | ICD-10-CM | POA: Diagnosis not present

## 2023-09-15 DIAGNOSIS — L89892 Pressure ulcer of other site, stage 2: Secondary | ICD-10-CM | POA: Diagnosis not present

## 2023-09-15 DIAGNOSIS — I83893 Varicose veins of bilateral lower extremities with other complications: Secondary | ICD-10-CM | POA: Diagnosis not present

## 2023-09-15 DIAGNOSIS — I129 Hypertensive chronic kidney disease with stage 1 through stage 4 chronic kidney disease, or unspecified chronic kidney disease: Secondary | ICD-10-CM | POA: Diagnosis not present

## 2023-09-15 DIAGNOSIS — N182 Chronic kidney disease, stage 2 (mild): Secondary | ICD-10-CM | POA: Diagnosis not present

## 2023-09-15 DIAGNOSIS — L89312 Pressure ulcer of right buttock, stage 2: Secondary | ICD-10-CM | POA: Diagnosis not present

## 2023-09-15 DIAGNOSIS — I890 Lymphedema, not elsewhere classified: Secondary | ICD-10-CM | POA: Diagnosis not present

## 2023-09-15 DIAGNOSIS — G40309 Generalized idiopathic epilepsy and epileptic syndromes, not intractable, without status epilepticus: Secondary | ICD-10-CM | POA: Diagnosis not present

## 2023-09-15 DIAGNOSIS — D649 Anemia, unspecified: Secondary | ICD-10-CM | POA: Diagnosis not present

## 2023-09-15 DIAGNOSIS — I872 Venous insufficiency (chronic) (peripheral): Secondary | ICD-10-CM | POA: Diagnosis not present

## 2023-09-17 DIAGNOSIS — I890 Lymphedema, not elsewhere classified: Secondary | ICD-10-CM | POA: Diagnosis not present

## 2023-09-17 DIAGNOSIS — I129 Hypertensive chronic kidney disease with stage 1 through stage 4 chronic kidney disease, or unspecified chronic kidney disease: Secondary | ICD-10-CM | POA: Diagnosis not present

## 2023-09-17 DIAGNOSIS — I872 Venous insufficiency (chronic) (peripheral): Secondary | ICD-10-CM | POA: Diagnosis not present

## 2023-09-17 DIAGNOSIS — L89312 Pressure ulcer of right buttock, stage 2: Secondary | ICD-10-CM | POA: Diagnosis not present

## 2023-09-17 DIAGNOSIS — G40309 Generalized idiopathic epilepsy and epileptic syndromes, not intractable, without status epilepticus: Secondary | ICD-10-CM | POA: Diagnosis not present

## 2023-09-17 DIAGNOSIS — L89892 Pressure ulcer of other site, stage 2: Secondary | ICD-10-CM | POA: Diagnosis not present

## 2023-09-17 DIAGNOSIS — D649 Anemia, unspecified: Secondary | ICD-10-CM | POA: Diagnosis not present

## 2023-09-17 DIAGNOSIS — I83893 Varicose veins of bilateral lower extremities with other complications: Secondary | ICD-10-CM | POA: Diagnosis not present

## 2023-09-17 DIAGNOSIS — N182 Chronic kidney disease, stage 2 (mild): Secondary | ICD-10-CM | POA: Diagnosis not present

## 2023-09-20 DIAGNOSIS — I890 Lymphedema, not elsewhere classified: Secondary | ICD-10-CM | POA: Diagnosis not present

## 2023-09-20 DIAGNOSIS — L89312 Pressure ulcer of right buttock, stage 2: Secondary | ICD-10-CM | POA: Diagnosis not present

## 2023-09-20 DIAGNOSIS — N182 Chronic kidney disease, stage 2 (mild): Secondary | ICD-10-CM | POA: Diagnosis not present

## 2023-09-20 DIAGNOSIS — L89892 Pressure ulcer of other site, stage 2: Secondary | ICD-10-CM | POA: Diagnosis not present

## 2023-09-20 DIAGNOSIS — I872 Venous insufficiency (chronic) (peripheral): Secondary | ICD-10-CM | POA: Diagnosis not present

## 2023-09-20 DIAGNOSIS — I83893 Varicose veins of bilateral lower extremities with other complications: Secondary | ICD-10-CM | POA: Diagnosis not present

## 2023-09-20 DIAGNOSIS — G40309 Generalized idiopathic epilepsy and epileptic syndromes, not intractable, without status epilepticus: Secondary | ICD-10-CM | POA: Diagnosis not present

## 2023-09-20 DIAGNOSIS — D649 Anemia, unspecified: Secondary | ICD-10-CM | POA: Diagnosis not present

## 2023-09-20 DIAGNOSIS — I129 Hypertensive chronic kidney disease with stage 1 through stage 4 chronic kidney disease, or unspecified chronic kidney disease: Secondary | ICD-10-CM | POA: Diagnosis not present

## 2023-09-21 DIAGNOSIS — G40309 Generalized idiopathic epilepsy and epileptic syndromes, not intractable, without status epilepticus: Secondary | ICD-10-CM | POA: Diagnosis not present

## 2023-09-21 DIAGNOSIS — I129 Hypertensive chronic kidney disease with stage 1 through stage 4 chronic kidney disease, or unspecified chronic kidney disease: Secondary | ICD-10-CM | POA: Diagnosis not present

## 2023-09-21 DIAGNOSIS — I890 Lymphedema, not elsewhere classified: Secondary | ICD-10-CM | POA: Diagnosis not present

## 2023-09-21 DIAGNOSIS — L89892 Pressure ulcer of other site, stage 2: Secondary | ICD-10-CM | POA: Diagnosis not present

## 2023-09-21 DIAGNOSIS — I83893 Varicose veins of bilateral lower extremities with other complications: Secondary | ICD-10-CM | POA: Diagnosis not present

## 2023-09-21 DIAGNOSIS — L89312 Pressure ulcer of right buttock, stage 2: Secondary | ICD-10-CM | POA: Diagnosis not present

## 2023-09-21 DIAGNOSIS — N182 Chronic kidney disease, stage 2 (mild): Secondary | ICD-10-CM | POA: Diagnosis not present

## 2023-09-21 DIAGNOSIS — I872 Venous insufficiency (chronic) (peripheral): Secondary | ICD-10-CM | POA: Diagnosis not present

## 2023-09-21 DIAGNOSIS — D649 Anemia, unspecified: Secondary | ICD-10-CM | POA: Diagnosis not present

## 2023-09-23 DIAGNOSIS — L89322 Pressure ulcer of left buttock, stage 2: Secondary | ICD-10-CM | POA: Diagnosis not present

## 2023-09-23 DIAGNOSIS — I130 Hypertensive heart and chronic kidney disease with heart failure and stage 1 through stage 4 chronic kidney disease, or unspecified chronic kidney disease: Secondary | ICD-10-CM | POA: Diagnosis not present

## 2023-09-23 DIAGNOSIS — I509 Heart failure, unspecified: Secondary | ICD-10-CM | POA: Diagnosis not present

## 2023-09-23 DIAGNOSIS — I890 Lymphedema, not elsewhere classified: Secondary | ICD-10-CM | POA: Diagnosis not present

## 2023-09-23 DIAGNOSIS — N182 Chronic kidney disease, stage 2 (mild): Secondary | ICD-10-CM | POA: Diagnosis not present

## 2023-09-23 DIAGNOSIS — J45909 Unspecified asthma, uncomplicated: Secondary | ICD-10-CM | POA: Diagnosis not present

## 2023-09-23 DIAGNOSIS — I872 Venous insufficiency (chronic) (peripheral): Secondary | ICD-10-CM | POA: Diagnosis not present

## 2023-09-23 DIAGNOSIS — S81802A Unspecified open wound, left lower leg, initial encounter: Secondary | ICD-10-CM | POA: Diagnosis not present

## 2023-09-23 DIAGNOSIS — L89899 Pressure ulcer of other site, unspecified stage: Secondary | ICD-10-CM | POA: Diagnosis not present

## 2023-09-27 DIAGNOSIS — L89312 Pressure ulcer of right buttock, stage 2: Secondary | ICD-10-CM | POA: Diagnosis not present

## 2023-09-27 DIAGNOSIS — G40309 Generalized idiopathic epilepsy and epileptic syndromes, not intractable, without status epilepticus: Secondary | ICD-10-CM | POA: Diagnosis not present

## 2023-09-27 DIAGNOSIS — I129 Hypertensive chronic kidney disease with stage 1 through stage 4 chronic kidney disease, or unspecified chronic kidney disease: Secondary | ICD-10-CM | POA: Diagnosis not present

## 2023-09-27 DIAGNOSIS — L89892 Pressure ulcer of other site, stage 2: Secondary | ICD-10-CM | POA: Diagnosis not present

## 2023-09-27 DIAGNOSIS — I890 Lymphedema, not elsewhere classified: Secondary | ICD-10-CM | POA: Diagnosis not present

## 2023-09-27 DIAGNOSIS — I83893 Varicose veins of bilateral lower extremities with other complications: Secondary | ICD-10-CM | POA: Diagnosis not present

## 2023-09-27 DIAGNOSIS — I872 Venous insufficiency (chronic) (peripheral): Secondary | ICD-10-CM | POA: Diagnosis not present

## 2023-09-27 DIAGNOSIS — D649 Anemia, unspecified: Secondary | ICD-10-CM | POA: Diagnosis not present

## 2023-09-27 DIAGNOSIS — N182 Chronic kidney disease, stage 2 (mild): Secondary | ICD-10-CM | POA: Diagnosis not present

## 2023-09-28 DIAGNOSIS — L89312 Pressure ulcer of right buttock, stage 2: Secondary | ICD-10-CM | POA: Diagnosis not present

## 2023-09-28 DIAGNOSIS — L89892 Pressure ulcer of other site, stage 2: Secondary | ICD-10-CM | POA: Diagnosis not present

## 2023-09-28 DIAGNOSIS — N182 Chronic kidney disease, stage 2 (mild): Secondary | ICD-10-CM | POA: Diagnosis not present

## 2023-09-28 DIAGNOSIS — G40309 Generalized idiopathic epilepsy and epileptic syndromes, not intractable, without status epilepticus: Secondary | ICD-10-CM | POA: Diagnosis not present

## 2023-09-28 DIAGNOSIS — I129 Hypertensive chronic kidney disease with stage 1 through stage 4 chronic kidney disease, or unspecified chronic kidney disease: Secondary | ICD-10-CM | POA: Diagnosis not present

## 2023-09-28 DIAGNOSIS — I872 Venous insufficiency (chronic) (peripheral): Secondary | ICD-10-CM | POA: Diagnosis not present

## 2023-09-28 DIAGNOSIS — D649 Anemia, unspecified: Secondary | ICD-10-CM | POA: Diagnosis not present

## 2023-09-28 DIAGNOSIS — I83893 Varicose veins of bilateral lower extremities with other complications: Secondary | ICD-10-CM | POA: Diagnosis not present

## 2023-09-28 DIAGNOSIS — I890 Lymphedema, not elsewhere classified: Secondary | ICD-10-CM | POA: Diagnosis not present

## 2023-09-30 ENCOUNTER — Other Ambulatory Visit: Payer: Self-pay | Admitting: Cardiology

## 2023-10-01 DIAGNOSIS — L89312 Pressure ulcer of right buttock, stage 2: Secondary | ICD-10-CM | POA: Diagnosis not present

## 2023-10-01 DIAGNOSIS — D649 Anemia, unspecified: Secondary | ICD-10-CM | POA: Diagnosis not present

## 2023-10-01 DIAGNOSIS — I872 Venous insufficiency (chronic) (peripheral): Secondary | ICD-10-CM | POA: Diagnosis not present

## 2023-10-01 DIAGNOSIS — I129 Hypertensive chronic kidney disease with stage 1 through stage 4 chronic kidney disease, or unspecified chronic kidney disease: Secondary | ICD-10-CM | POA: Diagnosis not present

## 2023-10-01 DIAGNOSIS — N182 Chronic kidney disease, stage 2 (mild): Secondary | ICD-10-CM | POA: Diagnosis not present

## 2023-10-01 DIAGNOSIS — G40309 Generalized idiopathic epilepsy and epileptic syndromes, not intractable, without status epilepticus: Secondary | ICD-10-CM | POA: Diagnosis not present

## 2023-10-01 DIAGNOSIS — I890 Lymphedema, not elsewhere classified: Secondary | ICD-10-CM | POA: Diagnosis not present

## 2023-10-01 DIAGNOSIS — I83893 Varicose veins of bilateral lower extremities with other complications: Secondary | ICD-10-CM | POA: Diagnosis not present

## 2023-10-01 DIAGNOSIS — L89892 Pressure ulcer of other site, stage 2: Secondary | ICD-10-CM | POA: Diagnosis not present

## 2023-10-03 ENCOUNTER — Other Ambulatory Visit: Payer: Self-pay | Admitting: Neurology

## 2023-10-03 DIAGNOSIS — G2581 Restless legs syndrome: Secondary | ICD-10-CM

## 2023-10-06 DIAGNOSIS — L89892 Pressure ulcer of other site, stage 2: Secondary | ICD-10-CM | POA: Diagnosis not present

## 2023-10-06 DIAGNOSIS — N182 Chronic kidney disease, stage 2 (mild): Secondary | ICD-10-CM | POA: Diagnosis not present

## 2023-10-06 DIAGNOSIS — I83893 Varicose veins of bilateral lower extremities with other complications: Secondary | ICD-10-CM | POA: Diagnosis not present

## 2023-10-06 DIAGNOSIS — I129 Hypertensive chronic kidney disease with stage 1 through stage 4 chronic kidney disease, or unspecified chronic kidney disease: Secondary | ICD-10-CM | POA: Diagnosis not present

## 2023-10-06 DIAGNOSIS — D649 Anemia, unspecified: Secondary | ICD-10-CM | POA: Diagnosis not present

## 2023-10-06 DIAGNOSIS — I890 Lymphedema, not elsewhere classified: Secondary | ICD-10-CM | POA: Diagnosis not present

## 2023-10-06 DIAGNOSIS — G40309 Generalized idiopathic epilepsy and epileptic syndromes, not intractable, without status epilepticus: Secondary | ICD-10-CM | POA: Diagnosis not present

## 2023-10-06 DIAGNOSIS — L89312 Pressure ulcer of right buttock, stage 2: Secondary | ICD-10-CM | POA: Diagnosis not present

## 2023-10-06 DIAGNOSIS — I872 Venous insufficiency (chronic) (peripheral): Secondary | ICD-10-CM | POA: Diagnosis not present

## 2023-10-08 DIAGNOSIS — L89892 Pressure ulcer of other site, stage 2: Secondary | ICD-10-CM | POA: Diagnosis not present

## 2023-10-08 DIAGNOSIS — D649 Anemia, unspecified: Secondary | ICD-10-CM | POA: Diagnosis not present

## 2023-10-08 DIAGNOSIS — N182 Chronic kidney disease, stage 2 (mild): Secondary | ICD-10-CM | POA: Diagnosis not present

## 2023-10-08 DIAGNOSIS — I129 Hypertensive chronic kidney disease with stage 1 through stage 4 chronic kidney disease, or unspecified chronic kidney disease: Secondary | ICD-10-CM | POA: Diagnosis not present

## 2023-10-08 DIAGNOSIS — I83893 Varicose veins of bilateral lower extremities with other complications: Secondary | ICD-10-CM | POA: Diagnosis not present

## 2023-10-08 DIAGNOSIS — I872 Venous insufficiency (chronic) (peripheral): Secondary | ICD-10-CM | POA: Diagnosis not present

## 2023-10-08 DIAGNOSIS — I890 Lymphedema, not elsewhere classified: Secondary | ICD-10-CM | POA: Diagnosis not present

## 2023-10-08 DIAGNOSIS — L89312 Pressure ulcer of right buttock, stage 2: Secondary | ICD-10-CM | POA: Diagnosis not present

## 2023-10-08 DIAGNOSIS — G40309 Generalized idiopathic epilepsy and epileptic syndromes, not intractable, without status epilepticus: Secondary | ICD-10-CM | POA: Diagnosis not present

## 2023-10-13 ENCOUNTER — Other Ambulatory Visit: Payer: Self-pay | Admitting: Adult Health

## 2023-10-13 DIAGNOSIS — G2581 Restless legs syndrome: Secondary | ICD-10-CM

## 2023-10-14 DIAGNOSIS — L89323 Pressure ulcer of left buttock, stage 3: Secondary | ICD-10-CM | POA: Diagnosis not present

## 2023-10-14 DIAGNOSIS — I890 Lymphedema, not elsewhere classified: Secondary | ICD-10-CM | POA: Diagnosis not present

## 2023-10-14 DIAGNOSIS — L89322 Pressure ulcer of left buttock, stage 2: Secondary | ICD-10-CM | POA: Diagnosis not present

## 2023-10-14 DIAGNOSIS — L89313 Pressure ulcer of right buttock, stage 3: Secondary | ICD-10-CM | POA: Diagnosis not present

## 2023-10-14 DIAGNOSIS — R600 Localized edema: Secondary | ICD-10-CM | POA: Diagnosis not present

## 2023-11-02 DIAGNOSIS — Z79899 Other long term (current) drug therapy: Secondary | ICD-10-CM | POA: Diagnosis not present

## 2023-11-02 DIAGNOSIS — M545 Low back pain, unspecified: Secondary | ICD-10-CM | POA: Diagnosis not present

## 2023-11-02 DIAGNOSIS — M47896 Other spondylosis, lumbar region: Secondary | ICD-10-CM | POA: Diagnosis not present

## 2023-11-03 ENCOUNTER — Other Ambulatory Visit: Payer: Self-pay

## 2023-11-03 ENCOUNTER — Ambulatory Visit (HOSPITAL_COMMUNITY): Attending: Family Medicine | Admitting: Physical Therapy

## 2023-11-03 DIAGNOSIS — I890 Lymphedema, not elsewhere classified: Secondary | ICD-10-CM | POA: Insufficient documentation
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
## 2023-11-05 ENCOUNTER — Ambulatory Visit (HOSPITAL_COMMUNITY): Admitting: Physical Therapy

## 2023-11-05 DIAGNOSIS — I890 Lymphedema, not elsewhere classified: Secondary | ICD-10-CM
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
## 2023-11-08 ENCOUNTER — Ambulatory Visit (HOSPITAL_COMMUNITY): Admitting: Physical Therapy

## 2023-11-08 DIAGNOSIS — I890 Lymphedema, not elsewhere classified: Secondary | ICD-10-CM
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
## 2023-11-10 ENCOUNTER — Encounter (HOSPITAL_COMMUNITY): Admitting: Physical Therapy

## 2023-11-10 DIAGNOSIS — Z8709 Personal history of other diseases of the respiratory system: Secondary | ICD-10-CM | POA: Diagnosis not present

## 2023-11-10 DIAGNOSIS — B356 Tinea cruris: Secondary | ICD-10-CM | POA: Diagnosis not present

## 2023-11-10 DIAGNOSIS — J209 Acute bronchitis, unspecified: Secondary | ICD-10-CM | POA: Diagnosis not present

## 2023-11-10 DIAGNOSIS — L03116 Cellulitis of left lower limb: Secondary | ICD-10-CM | POA: Diagnosis not present

## 2023-11-15 ENCOUNTER — Encounter (HOSPITAL_COMMUNITY): Admitting: Physical Therapy

## 2023-11-16 ENCOUNTER — Ambulatory Visit (HOSPITAL_COMMUNITY): Attending: Family Medicine | Admitting: Physical Therapy

## 2023-11-16 DIAGNOSIS — S71101S Unspecified open wound, right thigh, sequela: Secondary | ICD-10-CM | POA: Diagnosis not present

## 2023-11-16 DIAGNOSIS — S71102S Unspecified open wound, left thigh, sequela: Secondary | ICD-10-CM | POA: Diagnosis not present

## 2023-11-16 DIAGNOSIS — I890 Lymphedema, not elsewhere classified: Secondary | ICD-10-CM | POA: Insufficient documentation
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
## 2023-11-17 ENCOUNTER — Ambulatory Visit (HOSPITAL_COMMUNITY): Admitting: Physical Therapy

## 2023-11-17 DIAGNOSIS — I890 Lymphedema, not elsewhere classified: Secondary | ICD-10-CM

## 2023-11-17 DIAGNOSIS — S71102S Unspecified open wound, left thigh, sequela: Secondary | ICD-10-CM | POA: Diagnosis not present

## 2023-11-17 DIAGNOSIS — S71101S Unspecified open wound, right thigh, sequela: Secondary | ICD-10-CM | POA: Diagnosis not present
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
## 2023-11-19 ENCOUNTER — Encounter (HOSPITAL_COMMUNITY): Payer: Self-pay

## 2023-11-19 ENCOUNTER — Ambulatory Visit (HOSPITAL_COMMUNITY)

## 2023-11-19 DIAGNOSIS — S71101S Unspecified open wound, right thigh, sequela: Secondary | ICD-10-CM | POA: Diagnosis not present

## 2023-11-19 DIAGNOSIS — S71102S Unspecified open wound, left thigh, sequela: Secondary | ICD-10-CM | POA: Diagnosis not present

## 2023-11-19 DIAGNOSIS — I890 Lymphedema, not elsewhere classified: Secondary | ICD-10-CM

## 2023-11-19 DIAGNOSIS — L03116 Cellulitis of left lower limb: Secondary | ICD-10-CM | POA: Diagnosis not present
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
## 2023-11-22 ENCOUNTER — Ambulatory Visit (HOSPITAL_COMMUNITY): Admitting: Physical Therapy

## 2023-11-22 DIAGNOSIS — S71101S Unspecified open wound, right thigh, sequela: Secondary | ICD-10-CM | POA: Diagnosis not present

## 2023-11-22 DIAGNOSIS — I890 Lymphedema, not elsewhere classified: Secondary | ICD-10-CM | POA: Diagnosis not present

## 2023-11-22 DIAGNOSIS — S71102S Unspecified open wound, left thigh, sequela: Secondary | ICD-10-CM | POA: Diagnosis not present
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
## 2023-11-23 ENCOUNTER — Encounter: Payer: Self-pay | Admitting: Cardiology

## 2023-11-23 ENCOUNTER — Ambulatory Visit: Attending: Cardiology | Admitting: Cardiology

## 2023-11-23 VITALS — BP 174/70 | HR 62 | Ht 62.0 in | Wt 270.0 lb

## 2023-11-23 DIAGNOSIS — Z79899 Other long term (current) drug therapy: Secondary | ICD-10-CM

## 2023-11-23 DIAGNOSIS — I10 Essential (primary) hypertension: Secondary | ICD-10-CM

## 2023-11-23 DIAGNOSIS — E782 Mixed hyperlipidemia: Secondary | ICD-10-CM | POA: Diagnosis not present

## 2023-11-23 DIAGNOSIS — R600 Localized edema: Secondary | ICD-10-CM

## 2023-11-23 DIAGNOSIS — G4733 Obstructive sleep apnea (adult) (pediatric): Secondary | ICD-10-CM | POA: Diagnosis not present

## 2023-11-23 MED ORDER — VALSARTAN 160 MG PO TABS
160.0000 mg | ORAL_TABLET | Freq: Every day | ORAL | 3 refills | Status: AC
Start: 2023-11-23 — End: ?

## 2023-11-23 MED ORDER — HYDRALAZINE HCL 50 MG PO TABS
50.0000 mg | ORAL_TABLET | Freq: Two times a day (BID) | ORAL | 3 refills | Status: AC
Start: 2023-11-23 — End: 2024-02-21

## 2023-11-23 NOTE — Patient Instructions (Addendum)
 Medication Instructions:    Diovan  80 mg twice  a day  until the bottle is completed  Then start Diovan   ( Valsartan  )  160 mg daily   *If you need a refill on your cardiac medications before your next appointment, please call your pharmacy*   Lab Work: BMP   prior to you starting Diovan  160 mg  daily   If you have labs (blood work) drawn today and your tests are completely normal, you will receive your results only by: MyChart Message (if you have MyChart) OR A paper copy in the mail If you have any lab test that is abnormal or we need to change your treatment, we will call you to review the results.   Testing/Procedures: Not needed   Follow-Up: At [facility], you and your health needs are our priority.  As part of our continuing mission to provide you with exceptional heart care, we have created designated Provider Care Teams.  These Care Teams include your primary Cardiologist (physician) and Advanced Practice Providers (APPs -  Physician Assistants and Nurse Practitioners) who all work together to provide you with the care you need, when you need it.     Your next appointment:   3 month(s)  The format for your next appointment:   In Person  Provider:   Damien Braver, NP      Then, Alm Clay, MD will plan to see you again in 12 month(s).   Other Instructions

## 2023-11-23 NOTE — Progress Notes (Signed)
 Cardiology Office Note:  .   Date:  11/28/2023  ID:  [name], DOB June 11, 1950, MRN [id number] PCP/Referring Provider: Verena Mems, MD  [facility] Providers Cardiologist:  Alm Clay, MD     Chief Complaint  Patient presents with   Follow-up    Currently being treated for left leg wound.   Hypertension    Did not take medications for BP today.  Was running late.    Patient Profile: .     [name] is a super morbidly obese 73 y.o. female  with a PMH notable for OSA-CPAP, HTN HLD, palpitations with history of syncope as well as chronic BLE edema (combination of lymphedema and venous hypertension) who presents here for delayed 52-month follow-up.     [name] was last seen by Damien Braver, NP in December 2024.  BP was little higher better controlled at home.  Otherwise doing well.  No changes made.  Chronic edema.  Usually right is worse than left.  Subjective  Discussed the use of AI scribe software for clinical note transcription with the patient, who gave verbal consent to proceed.  History of Present Illness History of Present Illness [name] is a 73 year old female who presents for follow-up noting that she has had ongoing cellulitis/wound infection on her left leg.  This is unusual because typically it is her right leg that has more swelling and issues.  She is currently on Augmentin for what she says was MRSA that was still sensitive to Zosyn , ampicillin and Augmentin as well as ceftriaxone 's with MRSA activity.  She has been referred to wound care to help with treatment and likely hyperbaric treatment At [facility]  Despite this, her swelling seems to be pretty stable and she takes spironolactone  50 mg daily along with torsemide  20 mg daily along with Zaroxolyn .  Unfortunately she is not able to wear support socks on the left leg because of the infection.  Partially because of the discomfort and the fact that she has not taken  medications today her blood pressure is somewhat elevated but she tells me at home her son thought pressures are in the 140s to 160s on her current medications.  She has been taking hydralazine  25 mg only twice daily and not TID.  Thankfully despite having edema she really denies any significant PND orthopnea beyond her baseline.  From a cardiac standpoint she is stable with no chest pain, pressure, shortness of breath, heart palpitations, syncope, or bleeding, including blood in stools, urine, or epistaxis. She is fairly immobilized because of her legs but is able to get around the house with a cane and walker.  For longer distances she uses a wheelchair.    Objective   Current Meds  Medication Sig   albuterol  (VENTOLIN  HFA) 108 (90 Base) MCG/ACT inhaler Inhale 2 puffs into the lungs 2 (two) times daily as needed for wheezing or shortness of breath.   amoxicillin-clavulanate (AUGMENTIN) 875-125 MG tablet 1 tablet Orally every 12 hrs; Duration: 10 days   Calcium  Carbonate-Vitamin D  (CALCIUM  600+D PO) Take 1 tablet by mouth daily.    cholecalciferol  (VITAMIN D ) 1000 units tablet Take 2,000 Units by mouth daily.   clotrimazole-betamethasone (LOTRISONE) cream 1 Application.   Cyanocobalamin  (B-12 PO) Take 1 capsule by mouth daily.   ferrous sulfate  325 (65 FE) MG tablet Take 1 tablet (325 mg total) by mouth daily with breakfast.   fluticasone  (FLONASE ) 50 MCG/ACT nasal spray Place  1 spray into the nose 2 (two) times daily.   hydrOXYzine  (ATARAX /VISTARIL ) 25 MG tablet Take 1 tablet (25 mg total) by mouth daily as needed for itching or anxiety. (Patient taking differently: Take 25 mg by mouth at bedtime.)   levocetirizine (XYZAL ) 5 MG tablet Take 5 mg by mouth at bedtime.   magnesium  oxide (MAG-OX) 400 MG tablet Take 2 tablets (800 mg total) by mouth at bedtime.   montelukast  (SINGULAIR ) 10 MG tablet Take 10 mg by mouth at bedtime.   nystatin (MYCOSTATIN/NYSTOP) powder Apply 1 application  topically  2 (two) times daily. Underneath breast and groin   pantoprazole  (PROTONIX ) 40 MG tablet Take 1 tablet by mouth daily.   PHENobarbital  (LUMINAL) 32.4 MG tablet TAKE 1 TABLET (32.4 MG TOTAL) BY MOUTH AT BEDTIME.   pramipexole  (MIRAPEX ) 0.25 MG tablet TAKE 2 TABLETS AT 6 PM FOR RESTLESS LEG SYNDROME   Pramipexole  Dihydrochloride 0.75 MG TB24 TAKE 1 TABLET (0.75 MG TOTAL) BY MOUTH AT BEDTIME.   traMADol  (ULTRAM ) 50 MG tablet Take 50-100 mg by mouth in the morning, at noon, and at bedtime.   triamcinolone  ointment (KENALOG ) 0.1 % Apply 1 Application topically in the morning, at noon, in the evening, and at bedtime.   Vitamin A  2400 MCG (8000 UT) CAPS Take 1 capsule by mouth daily.   Cardiac Medications    aspirin  EC 81 MG tablet Take 1 tablet (81 mg total) by mouth at bedtime   []  hydrALAZINE  (APRESOLINE ) 25 MG tablet Take 1 tablet (25 mg total) by mouth 3 (three) times daily.   []  valsartan  (DIOVAN ) 80 MG tablet TAKE 1 TABLET BY MOUTH EVERY DAY   metolazone  (ZAROXOLYN ) 5 MG tablet TAKE 1 TABLET (5 MG TOTAL) BY MOUTH DAILY.   orsemide (DEMADEX ) 20 MG tablet Take 20 mg by mouth daily.   spironolactone  (ALDACTONE ) 50 MG tablet Take 1 tablet (50 mg total) by mouth daily.   rosuvastatin  (CRESTOR ) 20 MG tablet TAKE 1 TABLET (20 MG TOTAL) BY MOUTH DAILY.    potassium chloride  SA (KLOR-CON ) 20 MEQ tablet Take 1 tablet (20 mEq total) by mouth daily.    Studies Reviewed: SABRA   EKG Interpretation Date/Time:  Tuesday November 23 2023 09:22:27 EDT Ventricular Rate:  62 PR Interval:    QRS Duration:  94 QT Interval:  438 QTC Calculation: 444 R Axis:   -17  Text Interpretation: Sinus rhythm with 1st degree A-V block Minimal voltage criteria for LVH, may be normal variant ( R in aVL ) Cannot rule out Anterior infarct (cited on or before 18-Jan-2021) When compared with ECG of 18-Jan-2021 21:18, No significant change since Confirmed by Anner Lenis ([id number]) on 11/23/2023 9:51:21 AM    Labs from PCPs office  (08/05/2023): Cr 0.75, NA 138, K+ 4.1.  Normal LFTs.  WBC 5.1, H/H13.4/39.5, PLT 191.; TC 113, TG 149, HLD 37, LDL 50.   Left leg wound cultures showed oxacillin/methicillin resistant Staph aureus but sensitive to Zosyn  and Augmentin; also noted Proteus mirabilis   Risk Assessment/Calculations:            Physical Exam:   VS:  BP (!) 174/70   Pulse 62   Ht 5' 2 (1.575 m)   Wt 270 lb (122.5 kg)   SpO2 95%   BMI 49.38 kg/m    Wt Readings from Last 3 Encounters:  11/23/23 270 lb (122.5 kg)  04/23/23 280 lb 6.4 oz (127.2 kg)  12/29/22 273 lb (123.8 kg)    GEN: Well nourished,  well groomed in no acute distress; morbidly obese, in wheelchair. NECK: No JVD; No carotid bruits CARDIAC: Normal S1, S2; RRR, no murmurs, rubs, gallops RESPIRATORY:  Clear to auscultation without rales, wheezing or rhonchi ; nonlabored, good air movement. ABDOMEN: Soft, non-tender, non-distended EXTREMITIES: Borderline anasarca bilaterally right greater than left-edema to the point of not being out of bend her legs       ASSESSMENT AND PLAN: .    Problem List Items Addressed This Visit       Cardiology Problems   Essential hypertension - Primary (Chronic)   Current regimen includes hydralazine  25 mg twice daily and valsartan  80 mg daily along with her diuretics (spironolactone  50 mg daily, torsemide  20 mg daily and metolazone  5 mg daily)=> BP today is extremely elevated and is usually not well-controlled at home.. - Increase hydralazine  to 50 mg twice daily. - Increase valsartan  to 80 mg twice daily, then to 160 mg twice daily after current prescription is finished. - Order chemistry panel to monitor kidney function. - APP follow up in 2-3 months for blood pressure management.       Relevant Medications   valsartan  (DIOVAN ) 160 MG tablet   hydrALAZINE  (APRESOLINE ) 50 MG tablet   Other Relevant Orders   EKG 12-Lead (Completed)   Basic metabolic panel with GFR     Other   Bilateral lower  extremity edema (Chronic)   Chronic Issue: Related to chronic venous hypertension and lipedema.  Currently undergoing wound care for left leg now.  Plan possible support stockings, consider prescription compression devices) or Unaboot per wound care.  Diuretics are reasonable, but I like to be very effective.      Medication management   Relevant Medications   valsartan  (DIOVAN ) 160 MG tablet   Other Relevant Orders   Basic metabolic panel with GFR   Mixed dyslipidemia (Chronic)   LDL 50 by last check with lipid panel looking great.  Continue rosuvastatin  20 mg daily.      Morbid obesity due to excess calories (HCC) (Chronic)   Very sedentary. Defer to PCP, but would have a low threshold to consider the possibility of Wegovy or Zepbound.      OSA on CPAP (Chronic)   Continue CPAP           Follow-Up: Return in about 3 months (around 02/23/2024) for Follow-up with APP, Alternate 3 months with APP, 12 month with MD. - Schedule follow-up in one year with cardiologist if blood pressure is well-managed.    Signed, Alm MICAEL Clay, MD, MS Alm Clay, M.D., M.S. Interventional Chartered certified accountant  Pager # [phone]

## 2023-11-24 ENCOUNTER — Ambulatory Visit (HOSPITAL_COMMUNITY): Admitting: Physical Therapy

## 2023-11-24 DIAGNOSIS — I890 Lymphedema, not elsewhere classified: Secondary | ICD-10-CM

## 2023-11-24 DIAGNOSIS — S71101S Unspecified open wound, right thigh, sequela: Secondary | ICD-10-CM | POA: Diagnosis not present

## 2023-11-24 DIAGNOSIS — S71102S Unspecified open wound, left thigh, sequela: Secondary | ICD-10-CM | POA: Diagnosis not present
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
## 2023-11-25 DIAGNOSIS — R5381 Other malaise: Secondary | ICD-10-CM | POA: Diagnosis not present

## 2023-11-25 DIAGNOSIS — L89312 Pressure ulcer of right buttock, stage 2: Secondary | ICD-10-CM | POA: Diagnosis not present

## 2023-11-25 DIAGNOSIS — I130 Hypertensive heart and chronic kidney disease with heart failure and stage 1 through stage 4 chronic kidney disease, or unspecified chronic kidney disease: Secondary | ICD-10-CM | POA: Diagnosis not present

## 2023-11-25 DIAGNOSIS — L89892 Pressure ulcer of other site, stage 2: Secondary | ICD-10-CM | POA: Diagnosis not present

## 2023-11-25 DIAGNOSIS — Z6841 Body Mass Index (BMI) 40.0 and over, adult: Secondary | ICD-10-CM | POA: Diagnosis not present

## 2023-11-25 DIAGNOSIS — I890 Lymphedema, not elsewhere classified: Secondary | ICD-10-CM | POA: Diagnosis not present

## 2023-11-25 DIAGNOSIS — L89322 Pressure ulcer of left buttock, stage 2: Secondary | ICD-10-CM | POA: Diagnosis not present

## 2023-11-25 DIAGNOSIS — I509 Heart failure, unspecified: Secondary | ICD-10-CM | POA: Diagnosis not present

## 2023-11-25 DIAGNOSIS — I872 Venous insufficiency (chronic) (peripheral): Secondary | ICD-10-CM | POA: Diagnosis not present

## 2023-11-25 DIAGNOSIS — N182 Chronic kidney disease, stage 2 (mild): Secondary | ICD-10-CM | POA: Diagnosis not present

## 2023-11-26 ENCOUNTER — Ambulatory Visit (HOSPITAL_COMMUNITY): Admitting: Physical Therapy

## 2023-11-26 DIAGNOSIS — I890 Lymphedema, not elsewhere classified: Secondary | ICD-10-CM | POA: Diagnosis not present

## 2023-11-26 DIAGNOSIS — S71101S Unspecified open wound, right thigh, sequela: Secondary | ICD-10-CM | POA: Diagnosis not present

## 2023-11-26 DIAGNOSIS — S71102S Unspecified open wound, left thigh, sequela: Secondary | ICD-10-CM | POA: Diagnosis not present
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
## 2023-11-28 ENCOUNTER — Encounter: Payer: Self-pay | Admitting: Cardiology

## 2023-11-28 NOTE — Assessment & Plan Note (Signed)
 LDL 50 by last check with lipid panel looking great.  Continue rosuvastatin  20 mg daily.

## 2023-11-28 NOTE — Assessment & Plan Note (Signed)
 Very sedentary. Defer to PCP, but would have a low threshold to consider the possibility of Wegovy or Zepbound.

## 2023-11-28 NOTE — Assessment & Plan Note (Signed)
 Continue CPAP.

## 2023-11-28 NOTE — Assessment & Plan Note (Signed)
 Current regimen includes hydralazine  25 mg twice daily and valsartan  80 mg daily along with her diuretics (spironolactone  50 mg daily, torsemide  20 mg daily and metolazone  5 mg daily)=> BP today is extremely elevated and is usually not well-controlled at home.. - Increase hydralazine  to 50 mg twice daily. - Increase valsartan  to 80 mg twice daily, then to 160 mg twice daily after current prescription is finished. - Order chemistry panel to monitor kidney function. - APP follow up in 2-3 months for blood pressure management.

## 2023-11-28 NOTE — Assessment & Plan Note (Signed)
 Chronic Issue: Related to chronic venous hypertension and lipedema.  Currently undergoing wound care for left leg now.  Plan possible support stockings, consider prescription compression devices) or Unaboot per wound care.  Diuretics are reasonable, but I like to be very effective.

## 2023-11-29 ENCOUNTER — Ambulatory Visit (HOSPITAL_COMMUNITY): Admitting: Physical Therapy

## 2023-11-29 DIAGNOSIS — I890 Lymphedema, not elsewhere classified: Secondary | ICD-10-CM | POA: Diagnosis not present

## 2023-11-29 DIAGNOSIS — S71101S Unspecified open wound, right thigh, sequela: Secondary | ICD-10-CM | POA: Diagnosis not present

## 2023-11-29 DIAGNOSIS — S71102S Unspecified open wound, left thigh, sequela: Secondary | ICD-10-CM | POA: Diagnosis not present
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
## 2023-12-01 ENCOUNTER — Ambulatory Visit (HOSPITAL_COMMUNITY): Admitting: Physical Therapy

## 2023-12-01 DIAGNOSIS — I890 Lymphedema, not elsewhere classified: Secondary | ICD-10-CM

## 2023-12-01 DIAGNOSIS — S71101S Unspecified open wound, right thigh, sequela: Secondary | ICD-10-CM | POA: Diagnosis not present

## 2023-12-01 DIAGNOSIS — S71102S Unspecified open wound, left thigh, sequela: Secondary | ICD-10-CM | POA: Diagnosis not present
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
## 2023-12-03 ENCOUNTER — Ambulatory Visit (HOSPITAL_COMMUNITY): Admitting: Physical Therapy

## 2023-12-03 DIAGNOSIS — S71101S Unspecified open wound, right thigh, sequela: Secondary | ICD-10-CM | POA: Diagnosis not present

## 2023-12-03 DIAGNOSIS — S71102S Unspecified open wound, left thigh, sequela: Secondary | ICD-10-CM | POA: Diagnosis not present

## 2023-12-03 DIAGNOSIS — I890 Lymphedema, not elsewhere classified: Secondary | ICD-10-CM
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
## 2023-12-04 ENCOUNTER — Other Ambulatory Visit: Payer: Self-pay | Admitting: Adult Health

## 2023-12-04 DIAGNOSIS — G2581 Restless legs syndrome: Secondary | ICD-10-CM

## 2023-12-06 ENCOUNTER — Ambulatory Visit (HOSPITAL_COMMUNITY): Admitting: Physical Therapy

## 2023-12-06 DIAGNOSIS — S71102S Unspecified open wound, left thigh, sequela: Secondary | ICD-10-CM | POA: Diagnosis not present

## 2023-12-06 DIAGNOSIS — I890 Lymphedema, not elsewhere classified: Secondary | ICD-10-CM

## 2023-12-06 DIAGNOSIS — S71101S Unspecified open wound, right thigh, sequela: Secondary | ICD-10-CM | POA: Diagnosis not present
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
## 2023-12-08 ENCOUNTER — Ambulatory Visit (HOSPITAL_COMMUNITY): Admitting: Physical Therapy

## 2023-12-08 DIAGNOSIS — S71102S Unspecified open wound, left thigh, sequela: Secondary | ICD-10-CM

## 2023-12-08 DIAGNOSIS — S71101S Unspecified open wound, right thigh, sequela: Secondary | ICD-10-CM

## 2023-12-08 DIAGNOSIS — I890 Lymphedema, not elsewhere classified: Secondary | ICD-10-CM | POA: Diagnosis not present
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
## 2023-12-09 ENCOUNTER — Other Ambulatory Visit: Payer: Self-pay

## 2023-12-09 NOTE — Therapy (Signed)
 [facility] Out[facility] Rehabilitation at [facility] [address] [place], [place], [address] Phone: [phone]   Fax:  [phone]  Patient Details  Name: AMYIA LODWICK MRN: [id number] Date of Birth: 02-Aug-1950 Referring Provider:  Verena Mems, MD  Encounter Date: 12/08/2023   Please see treatment note as wound care will be combined with lymphedema care at this point as there is no debridement needed.    Deriyah Kunath, PT CLT [phone]  12/09/2023, 10:00 AM  [facility] at [facility] [address] [place], [place], [address] Phone: [phone]   Fax:  [phone]

## 2023-12-10 ENCOUNTER — Ambulatory Visit (HOSPITAL_COMMUNITY): Admitting: Physical Therapy

## 2023-12-10 ENCOUNTER — Ambulatory Visit (HOSPITAL_COMMUNITY): Attending: Family Medicine | Admitting: Physical Therapy

## 2023-12-10 DIAGNOSIS — S71101S Unspecified open wound, right thigh, sequela: Secondary | ICD-10-CM | POA: Diagnosis not present

## 2023-12-10 DIAGNOSIS — I890 Lymphedema, not elsewhere classified: Secondary | ICD-10-CM | POA: Diagnosis not present

## 2023-12-10 DIAGNOSIS — S71102S Unspecified open wound, left thigh, sequela: Secondary | ICD-10-CM | POA: Diagnosis not present

## 2023-12-10 NOTE — Therapy (Signed)
 See lymphedema note there will not be a separate note for wound care.   Montie Metro, PT CLT [phone]
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
## 2023-12-10 NOTE — Therapy (Signed)
 [facility] Outpatient Rehabilitation at [facility] [address] [place], [place], [address] Phone: [phone]   Fax:  [phone]  Patient Details  Name: [name] MRN: [id number] Date of Birth: 1950-10-22 Referring Provider:  Verena Mems, MD  Encounter Date: 12/10/2023 Wound care completed prior to lymphedema care no charge.  Paizleigh Wilds, PT CLT [phone]  12/10/2023, 4:35 PM  [facility] Outpatient Rehabilitation at [facility] [address] [place], [place], [address] Phone: [phone]   Fax:  [phone]

## 2023-12-13 ENCOUNTER — Ambulatory Visit (HOSPITAL_COMMUNITY): Admitting: Physical Therapy

## 2023-12-13 DIAGNOSIS — I890 Lymphedema, not elsewhere classified: Secondary | ICD-10-CM

## 2023-12-13 DIAGNOSIS — S71102S Unspecified open wound, left thigh, sequela: Secondary | ICD-10-CM | POA: Diagnosis not present

## 2023-12-13 DIAGNOSIS — S71101S Unspecified open wound, right thigh, sequela: Secondary | ICD-10-CM
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
## 2023-12-15 ENCOUNTER — Ambulatory Visit (HOSPITAL_COMMUNITY): Admitting: Physical Therapy

## 2023-12-15 ENCOUNTER — Other Ambulatory Visit: Payer: Self-pay | Admitting: Adult Health

## 2023-12-15 DIAGNOSIS — I890 Lymphedema, not elsewhere classified: Secondary | ICD-10-CM

## 2023-12-15 DIAGNOSIS — S71102S Unspecified open wound, left thigh, sequela: Secondary | ICD-10-CM | POA: Diagnosis not present

## 2023-12-15 DIAGNOSIS — S71101S Unspecified open wound, right thigh, sequela: Secondary | ICD-10-CM

## 2023-12-15 DIAGNOSIS — G2581 Restless legs syndrome: Secondary | ICD-10-CM
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
## 2023-12-17 ENCOUNTER — Ambulatory Visit (HOSPITAL_COMMUNITY): Admitting: Physical Therapy

## 2023-12-17 DIAGNOSIS — S71102S Unspecified open wound, left thigh, sequela: Secondary | ICD-10-CM | POA: Diagnosis not present

## 2023-12-17 DIAGNOSIS — S71101S Unspecified open wound, right thigh, sequela: Secondary | ICD-10-CM | POA: Diagnosis not present

## 2023-12-17 DIAGNOSIS — I890 Lymphedema, not elsewhere classified: Secondary | ICD-10-CM
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
## 2023-12-20 ENCOUNTER — Ambulatory Visit (HOSPITAL_COMMUNITY): Admitting: Physical Therapy

## 2023-12-20 DIAGNOSIS — I890 Lymphedema, not elsewhere classified: Secondary | ICD-10-CM

## 2023-12-20 DIAGNOSIS — S71102S Unspecified open wound, left thigh, sequela: Secondary | ICD-10-CM | POA: Diagnosis not present

## 2023-12-20 DIAGNOSIS — S71101S Unspecified open wound, right thigh, sequela: Secondary | ICD-10-CM
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
## 2023-12-21 NOTE — Addendum Note (Signed)
 Addended by: NELWYN MONTIE PARAS on: 12/21/2023 10:30 AM   Modules accepted: Orders

## 2023-12-22 ENCOUNTER — Ambulatory Visit (HOSPITAL_COMMUNITY): Admitting: Physical Therapy

## 2023-12-22 DIAGNOSIS — I890 Lymphedema, not elsewhere classified: Secondary | ICD-10-CM | POA: Diagnosis not present

## 2023-12-22 DIAGNOSIS — S71101S Unspecified open wound, right thigh, sequela: Secondary | ICD-10-CM

## 2023-12-22 DIAGNOSIS — S71102S Unspecified open wound, left thigh, sequela: Secondary | ICD-10-CM
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
## 2023-12-24 ENCOUNTER — Ambulatory Visit (HOSPITAL_COMMUNITY): Admitting: Physical Therapy

## 2023-12-24 ENCOUNTER — Ambulatory Visit (HOSPITAL_COMMUNITY)

## 2023-12-24 DIAGNOSIS — S71102S Unspecified open wound, left thigh, sequela: Secondary | ICD-10-CM

## 2023-12-24 DIAGNOSIS — I890 Lymphedema, not elsewhere classified: Secondary | ICD-10-CM | POA: Diagnosis not present

## 2023-12-24 DIAGNOSIS — S71101S Unspecified open wound, right thigh, sequela: Secondary | ICD-10-CM | POA: Diagnosis not present
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
## 2023-12-27 ENCOUNTER — Ambulatory Visit (HOSPITAL_COMMUNITY): Admitting: Physical Therapy

## 2023-12-27 DIAGNOSIS — I890 Lymphedema, not elsewhere classified: Secondary | ICD-10-CM | POA: Diagnosis not present

## 2023-12-27 DIAGNOSIS — S71101S Unspecified open wound, right thigh, sequela: Secondary | ICD-10-CM

## 2023-12-27 DIAGNOSIS — S71102S Unspecified open wound, left thigh, sequela: Secondary | ICD-10-CM
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
## 2023-12-28 DIAGNOSIS — G4733 Obstructive sleep apnea (adult) (pediatric): Secondary | ICD-10-CM | POA: Diagnosis not present

## 2023-12-28 DIAGNOSIS — L89209 Pressure ulcer of unspecified hip, unspecified stage: Secondary | ICD-10-CM | POA: Diagnosis not present

## 2023-12-28 DIAGNOSIS — R399 Unspecified symptoms and signs involving the genitourinary system: Secondary | ICD-10-CM | POA: Diagnosis not present

## 2023-12-28 DIAGNOSIS — I890 Lymphedema, not elsewhere classified: Secondary | ICD-10-CM | POA: Diagnosis not present

## 2023-12-28 DIAGNOSIS — G40309 Generalized idiopathic epilepsy and epileptic syndromes, not intractable, without status epilepticus: Secondary | ICD-10-CM | POA: Diagnosis not present

## 2023-12-28 DIAGNOSIS — I10 Essential (primary) hypertension: Secondary | ICD-10-CM | POA: Diagnosis not present

## 2023-12-29 ENCOUNTER — Ambulatory Visit (HOSPITAL_COMMUNITY)

## 2023-12-29 ENCOUNTER — Ambulatory Visit: Payer: Medicare PPO | Admitting: Adult Health

## 2023-12-29 ENCOUNTER — Encounter: Payer: Self-pay | Admitting: Adult Health

## 2023-12-29 ENCOUNTER — Ambulatory Visit (HOSPITAL_COMMUNITY): Admitting: Physical Therapy

## 2023-12-29 VITALS — BP 142/82 | HR 71

## 2023-12-29 DIAGNOSIS — Z5181 Encounter for therapeutic drug level monitoring: Secondary | ICD-10-CM | POA: Diagnosis not present

## 2023-12-29 DIAGNOSIS — R569 Unspecified convulsions: Secondary | ICD-10-CM | POA: Diagnosis not present

## 2023-12-29 DIAGNOSIS — S71101S Unspecified open wound, right thigh, sequela: Secondary | ICD-10-CM | POA: Diagnosis not present

## 2023-12-29 DIAGNOSIS — S71102S Unspecified open wound, left thigh, sequela: Secondary | ICD-10-CM | POA: Diagnosis not present

## 2023-12-29 DIAGNOSIS — G4733 Obstructive sleep apnea (adult) (pediatric): Secondary | ICD-10-CM

## 2023-12-29 DIAGNOSIS — I890 Lymphedema, not elsewhere classified: Secondary | ICD-10-CM

## 2023-12-29 DIAGNOSIS — G2581 Restless legs syndrome: Secondary | ICD-10-CM | POA: Diagnosis not present

## 2023-12-29 NOTE — Patient Instructions (Signed)
 Your Plan:  Restart      Thank you for coming to see us  at [facility]. I hope we have been able to provide you high quality care today.  You may receive a patient satisfaction survey over the next few weeks. We would appreciate your feedback and comments so that we may continue to improve ourselves and the health of our patients.
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
## 2023-12-30 ENCOUNTER — Ambulatory Visit: Payer: Self-pay | Admitting: Adult Health

## 2023-12-30 LAB — CBC WITH DIFFERENTIAL/PLATELET
Basophils Absolute: 0 x10E3/uL (ref 0.0–0.2)
Basos: 1 %
EOS (ABSOLUTE): 0.3 x10E3/uL (ref 0.0–0.4)
Eos: 7 %
Hematocrit: 39 % (ref 34.0–46.6)
Hemoglobin: 12.9 g/dL (ref 11.1–15.9)
Immature Grans (Abs): 0 x10E3/uL (ref 0.0–0.1)
Immature Granulocytes: 0 %
Lymphocytes Absolute: 1.9 x10E3/uL (ref 0.7–3.1)
Lymphs: 40 %
MCH: 29.9 pg (ref 26.6–33.0)
MCHC: 33.1 g/dL (ref 31.5–35.7)
MCV: 91 fL (ref 79–97)
Monocytes Absolute: 0.4 x10E3/uL (ref 0.1–0.9)
Monocytes: 10 %
Neutrophils Absolute: 1.9 x10E3/uL (ref 1.4–7.0)
Neutrophils: 42 %
Platelets: 182 x10E3/uL (ref 150–450)
RBC: 4.31 x10E6/uL (ref 3.77–5.28)
RDW: 13.3 % (ref 11.7–15.4)
WBC: 4.6 x10E3/uL (ref 3.4–10.8)

## 2023-12-30 LAB — PHENOBARBITAL LEVEL: Phenobarbital, Serum: 5 ug/mL — ABNORMAL LOW (ref 15–40)

## 2023-12-30 LAB — COMPREHENSIVE METABOLIC PANEL WITH GFR
ALT: 31 IU/L (ref 0–32)
AST: 34 IU/L (ref 0–40)
Albumin: 4.1 g/dL (ref 3.8–4.8)
Alkaline Phosphatase: 102 IU/L (ref 44–121)
BUN/Creatinine Ratio: 15 (ref 12–28)
BUN: 13 mg/dL (ref 8–27)
Bilirubin Total: 0.5 mg/dL (ref 0.0–1.2)
CO2: 21 mmol/L (ref 20–29)
Calcium: 9.6 mg/dL (ref 8.7–10.3)
Chloride: 105 mmol/L (ref 96–106)
Creatinine, Ser: 0.87 mg/dL (ref 0.57–1.00)
Globulin, Total: 2.7 g/dL (ref 1.5–4.5)
Glucose: 86 mg/dL (ref 70–99)
Potassium: 4 mmol/L (ref 3.5–5.2)
Sodium: 141 mmol/L (ref 134–144)
Total Protein: 6.8 g/dL (ref 6.0–8.5)
eGFR: 71 mL/min/1.73 (ref >59)

## 2023-12-31 ENCOUNTER — Ambulatory Visit (HOSPITAL_COMMUNITY)

## 2023-12-31 ENCOUNTER — Encounter (HOSPITAL_COMMUNITY): Payer: Self-pay

## 2023-12-31 DIAGNOSIS — I890 Lymphedema, not elsewhere classified: Secondary | ICD-10-CM

## 2023-12-31 DIAGNOSIS — S71102S Unspecified open wound, left thigh, sequela: Secondary | ICD-10-CM | POA: Diagnosis not present

## 2023-12-31 DIAGNOSIS — S71101S Unspecified open wound, right thigh, sequela: Secondary | ICD-10-CM | POA: Diagnosis not present
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
## 2024-01-03 ENCOUNTER — Telehealth: Payer: Self-pay | Admitting: Adult Health

## 2024-01-03 ENCOUNTER — Encounter (HOSPITAL_COMMUNITY)

## 2024-01-03 ENCOUNTER — Encounter (HOSPITAL_COMMUNITY): Payer: Self-pay

## 2024-01-03 ENCOUNTER — Ambulatory Visit (HOSPITAL_COMMUNITY)

## 2024-01-03 DIAGNOSIS — I890 Lymphedema, not elsewhere classified: Secondary | ICD-10-CM | POA: Diagnosis not present

## 2024-01-03 DIAGNOSIS — G2581 Restless legs syndrome: Secondary | ICD-10-CM

## 2024-01-03 MED ORDER — PRAMIPEXOLE DIHYDROCHLORIDE 0.25 MG PO TABS: ORAL_TABLET | ORAL | 5 refills | Status: DC

## 2024-01-03 MED ORDER — PRAMIPEXOLE DIHYDROCHLORIDE ER 0.75 MG PO TB24: 1.0000 | ORAL_TABLET | Freq: Every day | ORAL | 1 refills | Status: AC

## 2024-01-03 NOTE — Telephone Encounter (Signed)
 Pt called to request medication refill.Pt stated dhe called [facility] informed PT that it  was to soon to get refill. Pt also informed that one  of her medication  are out of stock at Pharmacy .  pramipexole  (MIRAPEX ) 0.25 MG tablet  Pramipexole  Dihydrochloride 0.75 MG TB24(Out of Stock)  Pt would like medication sent to :  [id number] - [place], [place] - [address] (Ph: [phone])

## 2024-01-03 NOTE — Telephone Encounter (Addendum)
Refill sent to pharmacy. Pt aware. °

## 2024-01-05 ENCOUNTER — Ambulatory Visit (HOSPITAL_COMMUNITY): Admitting: Physical Therapy

## 2024-01-05 ENCOUNTER — Encounter (HOSPITAL_COMMUNITY): Payer: Self-pay

## 2024-01-07 ENCOUNTER — Ambulatory Visit (HOSPITAL_COMMUNITY): Admitting: Physical Therapy

## 2024-01-07 DIAGNOSIS — I890 Lymphedema, not elsewhere classified: Secondary | ICD-10-CM | POA: Diagnosis not present

## 2024-01-07 DIAGNOSIS — S71102S Unspecified open wound, left thigh, sequela: Secondary | ICD-10-CM | POA: Diagnosis not present

## 2024-01-07 DIAGNOSIS — S71101S Unspecified open wound, right thigh, sequela: Secondary | ICD-10-CM | POA: Diagnosis not present
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
## 2024-01-12 ENCOUNTER — Ambulatory Visit (HOSPITAL_COMMUNITY): Attending: Family Medicine | Admitting: Physical Therapy

## 2024-01-12 DIAGNOSIS — S71101S Unspecified open wound, right thigh, sequela: Secondary | ICD-10-CM | POA: Insufficient documentation

## 2024-01-12 DIAGNOSIS — I890 Lymphedema, not elsewhere classified: Secondary | ICD-10-CM | POA: Insufficient documentation

## 2024-01-12 DIAGNOSIS — S71102S Unspecified open wound, left thigh, sequela: Secondary | ICD-10-CM | POA: Diagnosis present
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
## 2024-01-13 ENCOUNTER — Encounter: Payer: Self-pay | Admitting: Adult Health

## 2024-01-13 NOTE — Progress Notes (Signed)
 Mrs russel can follow up yearly once as long as she has a blood labs drawn by PCP alternating with us . Meaning we will draw CMET, CBC diff and after 6 months her PCP can do the same,  no recent seizure activity, maintaining on same meds . declining compliance on CPAP will not warrant q 6 months RV.   Please change to  RV in 12 months. Dedra Gores, MD

## 2024-01-14 ENCOUNTER — Ambulatory Visit (HOSPITAL_COMMUNITY): Admitting: Physical Therapy

## 2024-01-14 DIAGNOSIS — I890 Lymphedema, not elsewhere classified: Secondary | ICD-10-CM

## 2024-01-14 DIAGNOSIS — S71101S Unspecified open wound, right thigh, sequela: Secondary | ICD-10-CM

## 2024-01-14 DIAGNOSIS — S71102S Unspecified open wound, left thigh, sequela: Secondary | ICD-10-CM
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
## 2024-01-17 ENCOUNTER — Ambulatory Visit (HOSPITAL_COMMUNITY): Admitting: Physical Therapy

## 2024-01-17 DIAGNOSIS — S71101S Unspecified open wound, right thigh, sequela: Secondary | ICD-10-CM

## 2024-01-17 DIAGNOSIS — S71102S Unspecified open wound, left thigh, sequela: Secondary | ICD-10-CM

## 2024-01-17 DIAGNOSIS — I890 Lymphedema, not elsewhere classified: Secondary | ICD-10-CM
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
## 2024-01-19 ENCOUNTER — Ambulatory Visit (HOSPITAL_COMMUNITY): Admitting: Physical Therapy

## 2024-01-19 DIAGNOSIS — I890 Lymphedema, not elsewhere classified: Secondary | ICD-10-CM | POA: Diagnosis not present

## 2024-01-19 DIAGNOSIS — S71101S Unspecified open wound, right thigh, sequela: Secondary | ICD-10-CM

## 2024-01-19 DIAGNOSIS — S71102S Unspecified open wound, left thigh, sequela: Secondary | ICD-10-CM
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
## 2024-01-21 ENCOUNTER — Encounter (HOSPITAL_COMMUNITY): Admitting: Physical Therapy

## 2024-01-24 ENCOUNTER — Encounter (HOSPITAL_COMMUNITY): Admitting: Physical Therapy

## 2024-01-26 ENCOUNTER — Encounter (HOSPITAL_COMMUNITY): Admitting: Physical Therapy

## 2024-01-28 ENCOUNTER — Encounter (HOSPITAL_COMMUNITY): Payer: Self-pay

## 2024-01-28 ENCOUNTER — Ambulatory Visit (HOSPITAL_COMMUNITY)

## 2024-01-28 DIAGNOSIS — S71102S Unspecified open wound, left thigh, sequela: Secondary | ICD-10-CM

## 2024-01-28 DIAGNOSIS — I890 Lymphedema, not elsewhere classified: Secondary | ICD-10-CM | POA: Diagnosis not present

## 2024-01-28 DIAGNOSIS — S71101S Unspecified open wound, right thigh, sequela: Secondary | ICD-10-CM
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
## 2024-01-31 ENCOUNTER — Ambulatory Visit (HOSPITAL_COMMUNITY): Admitting: Physical Therapy

## 2024-01-31 DIAGNOSIS — S71101S Unspecified open wound, right thigh, sequela: Secondary | ICD-10-CM

## 2024-01-31 DIAGNOSIS — I890 Lymphedema, not elsewhere classified: Secondary | ICD-10-CM

## 2024-01-31 DIAGNOSIS — S71102S Unspecified open wound, left thigh, sequela: Secondary | ICD-10-CM
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
## 2024-02-02 ENCOUNTER — Encounter (HOSPITAL_COMMUNITY): Payer: Self-pay

## 2024-02-02 ENCOUNTER — Ambulatory Visit (HOSPITAL_COMMUNITY)

## 2024-02-02 DIAGNOSIS — I890 Lymphedema, not elsewhere classified: Secondary | ICD-10-CM | POA: Diagnosis not present

## 2024-02-02 DIAGNOSIS — S71101S Unspecified open wound, right thigh, sequela: Secondary | ICD-10-CM

## 2024-02-02 DIAGNOSIS — S71102S Unspecified open wound, left thigh, sequela: Secondary | ICD-10-CM
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
## 2024-02-04 ENCOUNTER — Ambulatory Visit (HOSPITAL_COMMUNITY)

## 2024-02-04 ENCOUNTER — Encounter (HOSPITAL_COMMUNITY): Payer: Self-pay

## 2024-02-04 DIAGNOSIS — S71101S Unspecified open wound, right thigh, sequela: Secondary | ICD-10-CM

## 2024-02-04 DIAGNOSIS — I890 Lymphedema, not elsewhere classified: Secondary | ICD-10-CM | POA: Diagnosis not present

## 2024-02-04 DIAGNOSIS — S71102S Unspecified open wound, left thigh, sequela: Secondary | ICD-10-CM
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
## 2024-02-07 ENCOUNTER — Ambulatory Visit (HOSPITAL_COMMUNITY): Admitting: Physical Therapy

## 2024-02-07 DIAGNOSIS — I890 Lymphedema, not elsewhere classified: Secondary | ICD-10-CM | POA: Diagnosis not present

## 2024-02-07 DIAGNOSIS — S71102S Unspecified open wound, left thigh, sequela: Secondary | ICD-10-CM

## 2024-02-07 DIAGNOSIS — S71101S Unspecified open wound, right thigh, sequela: Secondary | ICD-10-CM
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
## 2024-02-09 ENCOUNTER — Ambulatory Visit (HOSPITAL_COMMUNITY): Attending: Family Medicine | Admitting: Physical Therapy

## 2024-02-09 DIAGNOSIS — S71102S Unspecified open wound, left thigh, sequela: Secondary | ICD-10-CM | POA: Diagnosis present

## 2024-02-09 DIAGNOSIS — S71101S Unspecified open wound, right thigh, sequela: Secondary | ICD-10-CM | POA: Diagnosis present

## 2024-02-09 DIAGNOSIS — I890 Lymphedema, not elsewhere classified: Secondary | ICD-10-CM | POA: Diagnosis present
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
## 2024-02-11 ENCOUNTER — Encounter (HOSPITAL_COMMUNITY): Payer: Self-pay

## 2024-02-11 ENCOUNTER — Encounter (HOSPITAL_COMMUNITY): Admitting: Physical Therapy

## 2024-02-14 ENCOUNTER — Ambulatory Visit (HOSPITAL_COMMUNITY): Admitting: Physical Therapy

## 2024-02-14 DIAGNOSIS — I890 Lymphedema, not elsewhere classified: Secondary | ICD-10-CM | POA: Diagnosis not present

## 2024-02-14 DIAGNOSIS — S71101S Unspecified open wound, right thigh, sequela: Secondary | ICD-10-CM

## 2024-02-14 DIAGNOSIS — S71102S Unspecified open wound, left thigh, sequela: Secondary | ICD-10-CM
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
## 2024-02-16 ENCOUNTER — Ambulatory Visit (HOSPITAL_COMMUNITY): Admitting: Physical Therapy

## 2024-02-16 ENCOUNTER — Other Ambulatory Visit: Payer: Self-pay | Admitting: Neurology

## 2024-02-16 DIAGNOSIS — I890 Lymphedema, not elsewhere classified: Secondary | ICD-10-CM

## 2024-02-16 DIAGNOSIS — S71102S Unspecified open wound, left thigh, sequela: Secondary | ICD-10-CM
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
## 2024-02-16 NOTE — Telephone Encounter (Signed)
 Last seen on 12/29/23 Follow up scheduled on 09/11/24   Dispensed Days Supply Quantity Provider Pharmacy  PHENOBARBITAL  32.4 MG TABLET 01/05/2024 30 30 each Vear Charlie LABOR, MD [facility] [phone] - G...      Rx pending to be signed

## 2024-02-18 ENCOUNTER — Ambulatory Visit (HOSPITAL_COMMUNITY): Admitting: Physical Therapy

## 2024-02-18 DIAGNOSIS — I890 Lymphedema, not elsewhere classified: Secondary | ICD-10-CM | POA: Diagnosis not present

## 2024-02-18 DIAGNOSIS — S71102S Unspecified open wound, left thigh, sequela: Secondary | ICD-10-CM
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
## 2024-02-23 ENCOUNTER — Encounter (HOSPITAL_COMMUNITY): Admitting: Physical Therapy

## 2024-02-23 ENCOUNTER — Ambulatory Visit: Admitting: Nurse Practitioner
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
## 2024-02-25 ENCOUNTER — Encounter (HOSPITAL_COMMUNITY): Admitting: Physical Therapy

## 2024-02-28 ENCOUNTER — Ambulatory Visit (HOSPITAL_COMMUNITY)

## 2024-02-28 ENCOUNTER — Encounter (HOSPITAL_COMMUNITY): Payer: Self-pay

## 2024-02-28 DIAGNOSIS — I890 Lymphedema, not elsewhere classified: Secondary | ICD-10-CM

## 2024-02-28 DIAGNOSIS — S71102S Unspecified open wound, left thigh, sequela: Secondary | ICD-10-CM
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
## 2024-03-01 ENCOUNTER — Encounter (HOSPITAL_COMMUNITY): Payer: Self-pay

## 2024-03-01 ENCOUNTER — Encounter (HOSPITAL_COMMUNITY): Admitting: Physical Therapy

## 2024-03-06 ENCOUNTER — Ambulatory Visit (HOSPITAL_COMMUNITY)

## 2024-03-06 ENCOUNTER — Encounter (HOSPITAL_COMMUNITY): Payer: Self-pay

## 2024-03-06 DIAGNOSIS — I890 Lymphedema, not elsewhere classified: Secondary | ICD-10-CM | POA: Diagnosis not present

## 2024-03-06 DIAGNOSIS — S71102S Unspecified open wound, left thigh, sequela: Secondary | ICD-10-CM

## 2024-03-06 DIAGNOSIS — S71101S Unspecified open wound, right thigh, sequela: Secondary | ICD-10-CM
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
## 2024-03-08 ENCOUNTER — Encounter (HOSPITAL_COMMUNITY): Admitting: Physical Therapy

## 2024-03-08 ENCOUNTER — Encounter (HOSPITAL_COMMUNITY): Payer: Self-pay

## 2024-03-10 ENCOUNTER — Encounter (HOSPITAL_COMMUNITY): Admitting: Physical Therapy

## 2024-03-13 ENCOUNTER — Ambulatory Visit (HOSPITAL_COMMUNITY): Attending: Family Medicine | Admitting: Physical Therapy

## 2024-03-13 DIAGNOSIS — I890 Lymphedema, not elsewhere classified: Secondary | ICD-10-CM | POA: Diagnosis present

## 2024-03-13 DIAGNOSIS — S71102S Unspecified open wound, left thigh, sequela: Secondary | ICD-10-CM | POA: Insufficient documentation

## 2024-03-13 DIAGNOSIS — S71101S Unspecified open wound, right thigh, sequela: Secondary | ICD-10-CM | POA: Diagnosis present
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
## 2024-03-15 ENCOUNTER — Ambulatory Visit (HOSPITAL_COMMUNITY): Admitting: Physical Therapy

## 2024-03-15 DIAGNOSIS — I890 Lymphedema, not elsewhere classified: Secondary | ICD-10-CM
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
## 2024-03-15 NOTE — Addendum Note (Signed)
 Addended by: NELWYN MONTIE PARAS on: 03/15/2024 10:19 AM   Modules accepted: Orders

## 2024-03-16 ENCOUNTER — Encounter (HOSPITAL_COMMUNITY): Payer: Self-pay | Admitting: Physical Therapy

## 2024-03-16 NOTE — Therapy (Signed)
 [facility] Outpatient Rehabilitation at [facility] [address] [place], [place], [address] Phone: [phone]   Fax:  [phone]  Patient Details  Name: [name] MRN: [id number] Date of Birth: 06/10/1950 Referring Provider:  No ref. provider found  Encounter Date: 03/16/2024  PHYSICAL THERAPY DISCHARGE SUMMARY  Visits from Start of Care: 46  Current functional level related to goals / functional outcomes: PT has lost volume in both LE,   Remaining deficits: Rt LE remains severely indurated with noted papillomas throughout as well as fibrosis of the skin.     Education / Equipment: The importance of skin care, daily exercise and compression    Patient agrees to discharge. Patient goals were partially met. Patient is being discharged due to the patient's request.    Aashna Matson, PT CLT [phone]  03/16/2024, 3:12 PM  [facility] Outpatient Rehabilitation at [facility] [address] [place], [place], [address] Phone: [phone]   Fax:  [phone]

## 2024-03-17 ENCOUNTER — Ambulatory Visit (HOSPITAL_COMMUNITY): Admitting: Physical Therapy

## 2024-03-22 ENCOUNTER — Ambulatory Visit (HOSPITAL_COMMUNITY): Admitting: Physical Therapy

## 2024-03-24 ENCOUNTER — Ambulatory Visit (HOSPITAL_COMMUNITY): Admitting: Physical Therapy

## 2024-03-25 ENCOUNTER — Other Ambulatory Visit: Payer: Self-pay | Admitting: Nurse Practitioner

## 2024-03-29 ENCOUNTER — Ambulatory Visit (HOSPITAL_COMMUNITY): Admitting: Physical Therapy

## 2024-03-31 ENCOUNTER — Ambulatory Visit (HOSPITAL_COMMUNITY): Admitting: Physical Therapy

## 2024-06-15 ENCOUNTER — Other Ambulatory Visit: Payer: Self-pay | Admitting: Adult Health

## 2024-06-15 DIAGNOSIS — G2581 Restless legs syndrome: Secondary | ICD-10-CM

## 2024-09-11 ENCOUNTER — Ambulatory Visit: Admitting: Adult Health
